# Patient Record
Sex: Female | Born: 1945 | Race: White | Hispanic: No | Smoking: Never smoker
Health system: Southern US, Community
[De-identification: ages and names within clinical notes are randomized; demographics above are authoritative.]

## PROBLEM LIST (undated history)

## (undated) DIAGNOSIS — I8290 Acute embolism and thrombosis of unspecified vein: Secondary | ICD-10-CM

## (undated) DIAGNOSIS — M549 Dorsalgia, unspecified: Secondary | ICD-10-CM

## (undated) DIAGNOSIS — I272 Pulmonary hypertension, unspecified: Secondary | ICD-10-CM

## (undated) DIAGNOSIS — D473 Essential (hemorrhagic) thrombocythemia: Secondary | ICD-10-CM

## (undated) DIAGNOSIS — I82409 Acute embolism and thrombosis of unspecified deep veins of unspecified lower extremity: Secondary | ICD-10-CM

## (undated) DIAGNOSIS — F329 Major depressive disorder, single episode, unspecified: Secondary | ICD-10-CM

## (undated) DIAGNOSIS — G4700 Insomnia, unspecified: Secondary | ICD-10-CM

## (undated) DIAGNOSIS — E785 Hyperlipidemia, unspecified: Secondary | ICD-10-CM

## (undated) DIAGNOSIS — D649 Anemia, unspecified: Secondary | ICD-10-CM

## (undated) DIAGNOSIS — I2510 Atherosclerotic heart disease of native coronary artery without angina pectoris: Secondary | ICD-10-CM

## (undated) DIAGNOSIS — I959 Hypotension, unspecified: Secondary | ICD-10-CM

## (undated) DIAGNOSIS — G2581 Restless legs syndrome: Secondary | ICD-10-CM

## (undated) DIAGNOSIS — M4800 Spinal stenosis, site unspecified: Secondary | ICD-10-CM

## (undated) DIAGNOSIS — F32A Depression, unspecified: Secondary | ICD-10-CM

## (undated) DIAGNOSIS — G35 Multiple sclerosis: Secondary | ICD-10-CM

## (undated) DIAGNOSIS — R760 Raised antibody titer: Secondary | ICD-10-CM

## (undated) DIAGNOSIS — I10 Essential (primary) hypertension: Secondary | ICD-10-CM

## (undated) DIAGNOSIS — M359 Systemic involvement of connective tissue, unspecified: Secondary | ICD-10-CM

## (undated) DIAGNOSIS — G589 Mononeuropathy, unspecified: Secondary | ICD-10-CM

## (undated) DIAGNOSIS — R339 Retention of urine, unspecified: Secondary | ICD-10-CM

## (undated) DIAGNOSIS — I5032 Chronic diastolic (congestive) heart failure: Secondary | ICD-10-CM

## (undated) DIAGNOSIS — K589 Irritable bowel syndrome without diarrhea: Secondary | ICD-10-CM

## (undated) DIAGNOSIS — H3530 Unspecified macular degeneration: Secondary | ICD-10-CM

## (undated) HISTORY — DX: Hypotension, unspecified: I95.9

## (undated) HISTORY — DX: Essential (hemorrhagic) thrombocythemia: D47.3

## (undated) HISTORY — DX: Chronic diastolic (congestive) heart failure: I50.32

## (undated) HISTORY — DX: Dorsalgia, unspecified: M54.9

## (undated) HISTORY — DX: Multiple sclerosis: G35

## (undated) HISTORY — DX: Essential (primary) hypertension: I10

## (undated) HISTORY — DX: Acute embolism and thrombosis of unspecified deep veins of unspecified lower extremity: I82.409

## (undated) HISTORY — DX: Unspecified macular degeneration: H35.30

## (undated) HISTORY — DX: Mononeuropathy, unspecified: G58.9

## (undated) HISTORY — DX: Spinal stenosis, site unspecified: M48.00

## (undated) HISTORY — DX: Restless legs syndrome: G25.81

## (undated) HISTORY — DX: Acute embolism and thrombosis of unspecified vein: I82.90

## (undated) HISTORY — DX: Irritable bowel syndrome, unspecified: K58.9

## (undated) HISTORY — DX: Raised antibody titer: R76.0

## (undated) HISTORY — PX: CHOLECYSTECTOMY: SHX55

## (undated) HISTORY — DX: Hyperlipidemia, unspecified: E78.5

## (undated) HISTORY — PX: TONSILLECTOMY: SUR1361

## (undated) HISTORY — DX: Depression, unspecified: F32.A

## (undated) HISTORY — PX: LUMBAR LAMINECTOMY: SHX95

## (undated) HISTORY — DX: Retention of urine, unspecified: R33.9

## (undated) HISTORY — PX: APPENDECTOMY: SHX54

## (undated) HISTORY — DX: Pulmonary hypertension, unspecified: I27.20

## (undated) HISTORY — DX: Anemia, unspecified: D64.9

## (undated) HISTORY — DX: Major depressive disorder, single episode, unspecified: F32.9

## (undated) HISTORY — DX: Insomnia, unspecified: G47.00

## (undated) HISTORY — DX: Atherosclerotic heart disease of native coronary artery without angina pectoris: I25.10

## (undated) HISTORY — PX: LUMBAR DISC SURGERY: SHX700

---

## 2006-03-14 ENCOUNTER — Ambulatory Visit: Payer: Self-pay

## 2007-05-10 ENCOUNTER — Ambulatory Visit: Payer: Self-pay | Admitting: Family Medicine

## 2007-05-10 LAB — CONVERTED CEMR LAB
Basophils Relative: 0.7 % (ref 0.0–1.0)
Eosinophils Relative: 1 % (ref 0.0–5.0)
Lymphocytes Relative: 27 % (ref 12.0–46.0)
Monocytes Relative: 11 % (ref 3.0–11.0)
Neutro Abs: 3.1 10*3/uL (ref 1.4–7.7)
Platelets: 523 10*3/uL — ABNORMAL HIGH (ref 150–400)
RDW: 13.8 % (ref 11.5–14.6)
WBC: 5.2 10*3/uL (ref 4.5–10.5)

## 2007-05-31 ENCOUNTER — Encounter: Payer: Self-pay | Admitting: Family Medicine

## 2007-08-15 ENCOUNTER — Telehealth: Payer: Self-pay | Admitting: Family Medicine

## 2008-01-24 ENCOUNTER — Telehealth: Payer: Self-pay | Admitting: Family Medicine

## 2008-04-29 ENCOUNTER — Ambulatory Visit: Payer: Self-pay | Admitting: Family Medicine

## 2008-04-29 DIAGNOSIS — R5383 Other fatigue: Secondary | ICD-10-CM

## 2008-04-29 DIAGNOSIS — G35 Multiple sclerosis: Secondary | ICD-10-CM

## 2008-04-29 DIAGNOSIS — G589 Mononeuropathy, unspecified: Secondary | ICD-10-CM | POA: Insufficient documentation

## 2008-04-29 DIAGNOSIS — R5381 Other malaise: Secondary | ICD-10-CM

## 2008-04-29 HISTORY — DX: Multiple sclerosis: G35

## 2008-04-29 HISTORY — DX: Mononeuropathy, unspecified: G58.9

## 2008-04-29 LAB — CONVERTED CEMR LAB
Bilirubin Urine: NEGATIVE
Ketones, urine, test strip: NEGATIVE
Protein, U semiquant: NEGATIVE
Urobilinogen, UA: 0.2
pH: 5

## 2008-04-30 LAB — CONVERTED CEMR LAB
ALT: 18 units/L (ref 0–35)
AST: 27 units/L (ref 0–37)
Basophils Absolute: 0 10*3/uL (ref 0.0–0.1)
Basophils Relative: 0.6 % (ref 0.0–1.0)
Bilirubin, Direct: 0.1 mg/dL (ref 0.0–0.3)
CO2: 28 meq/L (ref 19–32)
Calcium: 9.4 mg/dL (ref 8.4–10.5)
Chloride: 99 meq/L (ref 96–112)
Cholesterol: 185 mg/dL (ref 0–200)
Creatinine, Ser: 1 mg/dL (ref 0.4–1.2)
Eosinophils Absolute: 0 10*3/uL (ref 0.0–0.7)
GFR calc non Af Amer: 60 mL/min
HDL: 72.3 mg/dL (ref >39.0)
LDL Cholesterol: 102 mg/dL — ABNORMAL HIGH (ref 0–99)
Lymphocytes Relative: 26.7 % (ref 12.0–46.0)
MCHC: 33.2 g/dL (ref 30.0–36.0)
MCV: 97.4 fL (ref 78.0–100.0)
Neutrophils Relative %: 63.3 % (ref 43.0–77.0)
RDW: 15.1 % — ABNORMAL HIGH (ref 11.5–14.6)
Sodium: 139 meq/L (ref 135–145)
TSH: 4.39 microintl units/mL (ref 0.35–5.50)
Total Bilirubin: 1 mg/dL (ref 0.3–1.2)
Triglycerides: 54 mg/dL (ref 0–149)
VLDL: 11 mg/dL (ref 0–40)

## 2008-05-02 ENCOUNTER — Telehealth: Payer: Self-pay | Admitting: Family Medicine

## 2008-05-20 ENCOUNTER — Encounter: Payer: Self-pay | Admitting: Family Medicine

## 2008-05-21 ENCOUNTER — Telehealth: Payer: Self-pay | Admitting: Family Medicine

## 2008-07-04 ENCOUNTER — Encounter: Payer: Self-pay | Admitting: Family Medicine

## 2008-09-16 ENCOUNTER — Ambulatory Visit: Payer: Self-pay | Admitting: Family Medicine

## 2008-09-17 ENCOUNTER — Telehealth: Payer: Self-pay | Admitting: Family Medicine

## 2008-10-08 ENCOUNTER — Encounter: Payer: Self-pay | Admitting: Family Medicine

## 2009-01-20 ENCOUNTER — Ambulatory Visit: Payer: Self-pay | Admitting: Family Medicine

## 2009-02-02 ENCOUNTER — Telehealth: Payer: Self-pay | Admitting: Family Medicine

## 2009-02-11 ENCOUNTER — Telehealth: Payer: Self-pay | Admitting: Family Medicine

## 2009-05-15 ENCOUNTER — Telehealth (INDEPENDENT_AMBULATORY_CARE_PROVIDER_SITE_OTHER): Payer: Self-pay | Admitting: *Deleted

## 2010-04-29 ENCOUNTER — Ambulatory Visit: Payer: Self-pay | Admitting: Family Medicine

## 2010-04-29 ENCOUNTER — Other Ambulatory Visit: Admission: RE | Admit: 2010-04-29 | Discharge: 2010-04-29 | Payer: Self-pay | Admitting: Family Medicine

## 2010-04-29 DIAGNOSIS — E7800 Pure hypercholesterolemia, unspecified: Secondary | ICD-10-CM

## 2010-04-29 DIAGNOSIS — N8112 Cystocele, lateral: Secondary | ICD-10-CM

## 2010-04-29 DIAGNOSIS — F32A Depression, unspecified: Secondary | ICD-10-CM | POA: Insufficient documentation

## 2010-04-29 DIAGNOSIS — T50995A Adverse effect of other drugs, medicaments and biological substances, initial encounter: Secondary | ICD-10-CM | POA: Insufficient documentation

## 2010-04-29 DIAGNOSIS — F329 Major depressive disorder, single episode, unspecified: Secondary | ICD-10-CM

## 2010-04-29 LAB — CONVERTED CEMR LAB
Glucose, Urine, Semiquant: NEGATIVE
Ketones, urine, test strip: NEGATIVE
Protein, U semiquant: NEGATIVE
Specific Gravity, Urine: 1.02
pH: 7

## 2010-04-29 LAB — HM PAP SMEAR

## 2010-05-12 LAB — CONVERTED CEMR LAB
AST: 25 units/L (ref 0–37)
BUN: 16 mg/dL (ref 6–23)
Basophils Absolute: 0.1 10*3/uL (ref 0.0–0.1)
Bilirubin, Direct: 0.1 mg/dL (ref 0.0–0.3)
Calcium: 9.1 mg/dL (ref 8.4–10.5)
Creatinine, Ser: 0.9 mg/dL (ref 0.4–1.2)
GFR calc non Af Amer: 66.05 mL/min (ref >60)
Glucose, Bld: 85 mg/dL (ref 70–99)
HDL: 93.6 mg/dL (ref >39.00)
Lymphocytes Relative: 23.9 % (ref 12.0–46.0)
Lymphs Abs: 1.2 10*3/uL (ref 0.7–4.0)
Monocytes Relative: 10.2 % (ref 3.0–12.0)
Neutrophils Relative %: 63.8 % (ref 43.0–77.0)
Pap Smear: NEGATIVE
Platelets: 379 10*3/uL (ref 150.0–400.0)
RDW: 14.7 % — ABNORMAL HIGH (ref 11.5–14.6)
TSH: 4.06 microintl units/mL (ref 0.35–5.50)
Total Bilirubin: 0.6 mg/dL (ref 0.3–1.2)
Triglycerides: 36 mg/dL (ref 0.0–149.0)
VLDL: 7.2 mg/dL (ref 0.0–40.0)
WBC: 4.9 10*3/uL (ref 4.5–10.5)

## 2010-11-28 DIAGNOSIS — D649 Anemia, unspecified: Secondary | ICD-10-CM

## 2010-11-28 HISTORY — PX: ANTERIOR LUMBAR FUSION: SHX1170

## 2010-11-28 HISTORY — DX: Anemia, unspecified: D64.9

## 2010-12-28 NOTE — Assessment & Plan Note (Signed)
Summary: emp---will fast//ccm   Vital Signs:  Patient profile:   65 year old female Height:      64 inches Weight:      119 pounds BMI:     20.50 O2 Sat:      98 % Temp:     97.6 degrees F Pulse rate:   96 / minute BP sitting:   110 / 72  (left arm)  Vitals Entered By: Pura Spice, RN (April 29, 2010 11:00 AM) CC: fasting for labs discuss problems refill meds    History of Present Illness: This 65 year old would've who is had known multiple sclerosis for many years and treated by physicians at [facility]diagnosed at age 29 M complaint is that of pain in the right heel with physical activity. She does not take Celebrex as prescribed nor diclofenac She is in today to discuss her medical problems and refill her medication She had colonoscopic exam 2005 she is under the care of Dr. Kerney Elbe just in [place] She desired a pelvic examination, she is uncertain as to what's in her vagina she feels as if she has a dropped bladder, cystocele Patient continues to be depressed over the loss of her husband and as continued to take Celexa have her go this continued to have some depression  EKG  Procedure date:  04/29/2010  Findings:       sinus rhythm with rate of:  57 Sinus Bradycardia Poor R wave progression-probale normal varian   Allergies (verified): No Known Drug Allergies  Past History:  Past Medical History: Last updated: 09/05/2007 thrombocytopenia  Past History:  Care Management (cont.): Ophthalmology: Dr Lamount Cranker exam 2011 per pt  Gastroenterology: Melene Plan to [facility]    Review of Systems      See HPI General:  See HPI; Complains of malaise. Eyes:  Complains of vision loss-both eyes; decreased vision bilaterally. ENT:  Denies decreased hearing, difficulty swallowing, ear discharge, earache, hoarseness, nasal congestion, nosebleeds, postnasal drainage, ringing in ears, sinus pressure, and sore throat. CV:  Denies bluish discoloration of lips or  nails, chest pain or discomfort, difficulty breathing at night, difficulty breathing while lying down, fainting, fatigue, leg cramps with exertion, lightheadness, near fainting, palpitations, shortness of breath with exertion, swelling of feet, swelling of hands, and weight gain. Resp:  Denies chest discomfort, chest pain with inspiration, cough, coughing up blood, excessive snoring, hypersomnolence, morning headaches, pleuritic, shortness of breath, sputum productive, and wheezing. GI:  Denies abdominal pain, bloody stools, change in bowel habits, constipation, dark tarry stools, diarrhea, excessive appetite, gas, hemorrhoids, indigestion, loss of appetite, nausea, vomiting, vomiting blood, and yellowish skin color. GU:  See HPI; concerned about possible cystocele. MS:  See HPI; Complains of joint pain.  Physical Exam  General:  Well-developed,well-nourished,in no acute distress; alert,appropriate and cooperative throughout examination Head:  Normocephalic and atraumatic without obvious abnormalities. No apparent alopecia or balding. Eyes:  No corneal or conjunctival inflammation noted. EOMI. Perrla. Funduscopic exam benign, without hemorrhages, exudates or papilledema. Vision grossly normal. Ears:  External ear exam shows no significant lesions or deformities.  Otoscopic examination reveals clear canals, tympanic membranes are intact bilaterally without bulging, retraction, inflammation or discharge. Hearing is grossly normal bilaterally. Nose:  External nasal examination shows no deformity or inflammation. Nasal mucosa are pink and moist without lesions or exudates. Mouth:  Oral mucosa and oropharynx without lesions or exudates.  Teeth in good repair. Neck:  No deformities, masses, or tenderness noted. Chest Wall:  No deformities, masses,  or tenderness noted. Breasts:  No mass, nodules, thickening, tenderness, bulging, retraction, inflamation, nipple discharge or skin changes noted.   Lungs:   Normal respiratory effort, chest expands symmetrically. Lungs are clear to auscultation, no crackles or wheezes. Heart:  Normal rate and regular rhythm. S1 and S2 normal without gallop, murmur, click, rub or other extra sounds. Abdomen:  Bowel sounds positive,abdomen soft and non-tender without masses, organomegaly or hernias noted. Rectal:  No external abnormalities noted. Normal sphincter tone. No rectal masses or tenderness. Genitalia:  pelvic examination essentially normal except for first degree cystocele no treatment needed Msk:  No deformity or scoliosis noted of thoracic or lumbar spine.   Pulses:  R and L carotid,radial,femoral,dorsalis pedis and posterior tibial pulses are full and equal bilaterally Extremities:  No clubbing, cyanosis, edema, or deformity noted with normal full range of motion of all joints.   Neurologic:  No cranial nerve deficits noted. Station and gait are normal. Plantar reflexes are down-going bilaterally. DTRs are symmetrical throughout. Sensory, motor and coordinative functions appear intact. Skin:  Intact without suspicious lesions or rashes Cervical Nodes:  No lymphadenopathy noted Axillary Nodes:  No palpable lymphadenopathy Inguinal Nodes:  No significant adenopathy Psych:  Cognition and judgment appear intact. Alert and cooperative with normal attention span and concentration. No apparent delusions, illusions, hallucinations   Impression & Recommendations:  Problem # 1:  MULTIPLE SCLEROSIS (ICD-340) Assessment Unchanged Hydrea and gabapentin prescribed gave  Problem # 2:  NEUROPATHY (ICD-355.9) Assessment: Unchanged gabapentin 300 mg q.h.s.  Problem # 3:  VITAMIN D DEFICIENCY (ICD-268.9) Assessment: New  Orders: T-Vitamin D (25-Hydroxy) (78295-62130)  Problem # 4:  ARTHRITIS (ICD-716.90) Assessment: Deteriorated diclofenac 75 mg b.i.d.  Problem # 5:  DEPRESSION, MILD (ICD-311) Assessment: Unchanged  Her updated medication list for this  problem includes:    Celexa 20 Mg Tabs (Citalopram hydrobromide) .Marland Kitchen... 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 Take 1 tablet by mouth two times a day after meals 2)  Aspirin 81 Mg Tbec (Aspirin) 3)  Hydrea 500 Mg Caps (Hydroxyurea) .... Once daily 4)  Gabapentin 300 Mg Caps (Gabapentin) .... 2 hs 5)  Hydrocodone-acetaminophen 5-325 Mg Tabs (Hydrocodone-acetaminophen) .Marland Kitchen.. 1 or 2 every 4-6 hrs as needed pain not to exceed 4 per day 6)  Celexa 20 Mg Tabs (Citalopram hydrobromide) .Marland Kitchen.. 1 by mouth once daily 7)  Vitamin D (ergocalciferol) 50000 Unit Caps (Ergocalciferol) .Marland KitchenMarland KitchenMarland Kitchen 1 weekly for 12 weeks  Other Orders: UA Dipstick w/o Micro (automated)  (81003) Venipuncture (46962) TLB-Lipid Panel (80061-LIPID) TLB-BMP (Basic Metabolic Panel-BMET) (80048-METABOL) TLB-Hepatic/Liver Function Pnl (80076-HEPATIC)  Patient Instructions: 1)  Restructure diclofenac for your arthritis 2)  Continued: Your treatment at [facility] for multiple sclerosis 3)  Continue Celexa for your depression 4)  Do not hesitate to use her hydrocodone for pain 5)  He had a small cystocele which does not need to be treated at this time Prescriptions: VITAMIN D (ERGOCALCIFEROL) 50000 UNIT CAPS (ERGOCALCIFEROL) 1 weekly for 12 weeks  #12 x 1   Entered and Authorized by:   Judithann Sheen MD   Signed by:   Judithann Sheen MD on  05/12/2010   Method used:   Electronically to        [facility] (retail)       [address]       [place], [place]   [address]       Ph: [phone]       Fax: [phone]   RxID:   [id number] HYDROCODONE-ACETAMINOPHEN 5-325 MG TABS (HYDROCODONE-ACETAMINOPHEN) 1 or 2 every 4-6 hrs as needed pain not to exceed 4 per day  #50 x 5   Entered and Authorized by:   Judithann Sheen MD   Signed by:   Judithann Sheen MD on 04/29/2010   Method used:   Print then Give to Patient   RxID:   [id number] CELEXA 20 MG TABS (CITALOPRAM HYDROBROMIDE) 1 by mouth once daily  #30 x 11   Entered and Authorized by:   Judithann Sheen MD   Signed by:   Judithann Sheen MD on 04/29/2010   Method used:   Electronically to        [facility] (retail)       [address]       [place], [place]  [address]       Ph: [phone]       Fax: [phone]   RxID:   [id number] DICLOFENAC SODIUM 75 MG  TBEC (DICLOFENAC SODIUM) Take 1 tablet by mouth two times a day after meals  #60 x 11   Entered and Authorized by:   Judithann Sheen MD   Signed by:   Judithann Sheen MD on 04/29/2010   Method used:   Electronically to        [facility] (retail)       [address]       [place], [place]  [address]       Ph: [phone]       Fax: [phone]   RxID:   [id number]          Laboratory Results   Urine Tests  Date/Time Recieved: April 29, 2010 1:17 PM  Date/Time Reported: April 29, 2010 1:17 PM   Routine Urinalysis   Color: yellow Appearance: Clear Glucose: negative   (Normal Range: Negative) Bilirubin: negative   (Normal Range: Negative) Ketone: negative   (Normal Range: Negative) Spec. Gravity: 1.020   (Normal Range: 1.003-1.035) Blood: trace-intact   (Normal Range: Negative) pH: 7.0   (Normal Range: 5.0-8.0) Protein: negative   (Normal Range: Negative) Urobilinogen: 0.2   (Normal Range: 0-1) Nitrite: negative   (Normal Range: Negative) Leukocyte Esterace: trace   (Normal Range: Negative)    Comments: Wynona Canes, CMA  April 29, 2010 1:17 PM

## 2011-02-15 ENCOUNTER — Telehealth: Payer: Self-pay | Admitting: *Deleted

## 2011-02-15 NOTE — Telephone Encounter (Signed)
Pt needs rx refill of hydrea 500 mg sent to [facility] in [place] [phone]

## 2011-02-16 NOTE — Telephone Encounter (Signed)
Pt called back to check on status of Hydrea 500mg  to [facility] in [place] [phone]. Pt has been out of med since Monday. Pls call in asap.

## 2011-02-17 ENCOUNTER — Other Ambulatory Visit: Payer: Self-pay | Admitting: Family Medicine

## 2011-02-17 MED ORDER — HYDROXYUREA 500 MG PO CAPS: 500.0000 mg | ORAL_CAPSULE | Freq: Every day | ORAL | Status: AC

## 2011-02-17 NOTE — Telephone Encounter (Signed)
rx faxed to [facility] in [place]

## 2011-02-24 NOTE — Telephone Encounter (Signed)
Done, per Dr. Scotty Court

## 2011-03-22 ENCOUNTER — Encounter: Payer: Self-pay | Admitting: Internal Medicine

## 2011-03-29 ENCOUNTER — Encounter: Payer: Self-pay | Admitting: Internal Medicine

## 2011-04-21 ENCOUNTER — Telehealth: Payer: Self-pay | Admitting: Family Medicine

## 2011-04-21 NOTE — Telephone Encounter (Signed)
Spoke with pt and she is aware she needs to call [facility] in [place].

## 2011-04-21 NOTE — Telephone Encounter (Signed)
Pt had back surgery 03/19/11 and is req a referral to [facility] in [place], [place], because pt can no longer drive to [place]. Pls call asap.

## 2011-04-28 ENCOUNTER — Telehealth: Payer: Self-pay

## 2011-04-28 NOTE — Telephone Encounter (Signed)
Received a message from Gwynn Burly regarding continuing skilled nursing visits with pt until her appointment with her new physicianJune.  Ok per Dr. Scotty Court

## 2011-05-26 ENCOUNTER — Ambulatory Visit (INDEPENDENT_AMBULATORY_CARE_PROVIDER_SITE_OTHER): Payer: BC Managed Care – HMO | Admitting: Family Medicine

## 2011-05-26 ENCOUNTER — Encounter: Payer: Self-pay | Admitting: Family Medicine

## 2011-05-26 DIAGNOSIS — F329 Major depressive disorder, single episode, unspecified: Secondary | ICD-10-CM

## 2011-05-26 DIAGNOSIS — F3289 Other specified depressive episodes: Secondary | ICD-10-CM

## 2011-05-26 DIAGNOSIS — G35 Multiple sclerosis: Secondary | ICD-10-CM

## 2011-05-26 DIAGNOSIS — D473 Essential (hemorrhagic) thrombocythemia: Secondary | ICD-10-CM | POA: Insufficient documentation

## 2011-05-26 DIAGNOSIS — R609 Edema, unspecified: Secondary | ICD-10-CM

## 2011-05-26 NOTE — Progress Notes (Signed)
New pt.   MS- long standing with dec sensation in the L foot.  Requesting records from [facility].  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  Due for labs.  Per patient, prev controlled on this med w/o ADE.   Edema- recently noted.  Not sob and no CP.  Not on BP meds and BP wnl today.  Better with elevation.  Back pain- longstanding and requesting records from [facility].    PMH and SH reviewed  ROS: See HPI, otherwise noncontributory.  Meds, vitals, and allergies reviewed.   GEN: nad, alert and oriented, stooped over due to apparently chronic back changes HEENT: mucous membranes moist NECK: supple w/o LA CV: rrr PULM: ctab, no inc wob ABD: soft, +bs EXT: 1+ edema but well perfused SKIN: no acute rash

## 2011-05-26 NOTE — Patient Instructions (Signed)
You can get your results through our phone system.  Follow the instructions on the blue card. Don't change your meds.  Let me know if the swelling gets worse.  Try to keep your legs elevated. I want to see you back for a visit in 3 months.  Take care.   We'll request your records.

## 2011-05-27 ENCOUNTER — Encounter: Payer: Self-pay | Admitting: Family Medicine

## 2011-05-27 LAB — COMPREHENSIVE METABOLIC PANEL
AST: 21 U/L (ref 0–37)
Alkaline Phosphatase: 66 U/L (ref 39–117)
BUN: 33 mg/dL — ABNORMAL HIGH (ref 6–23)
Creatinine, Ser: 0.8 mg/dL (ref 0.4–1.2)

## 2011-05-27 LAB — CBC WITH DIFFERENTIAL/PLATELET
Basophils Relative: 0.2 % (ref 0.0–3.0)
Eosinophils Absolute: 0 10*3/uL (ref 0.0–0.7)
HCT: 33.3 % — ABNORMAL LOW (ref 36.0–46.0)
Hemoglobin: 11 g/dL — ABNORMAL LOW (ref 12.0–15.0)
Lymphs Abs: 0.9 10*3/uL (ref 0.7–4.0)
MCHC: 33 g/dL (ref 30.0–36.0)
MCV: 94.8 fl (ref 78.0–100.0)
Monocytes Absolute: 0.5 10*3/uL (ref 0.1–1.0)
Neutro Abs: 9.6 10*3/uL — ABNORMAL HIGH (ref 1.4–7.7)
RBC: 3.51 Mil/uL — ABNORMAL LOW (ref 3.87–5.11)

## 2011-05-27 NOTE — Assessment & Plan Note (Signed)
Continue current meds 

## 2011-05-27 NOTE — Assessment & Plan Note (Signed)
No recent changes, requesting records.

## 2011-05-27 NOTE — Assessment & Plan Note (Signed)
Lungs ctab and nontoxic, will check labs and notify pt. No change in meds.

## 2011-05-27 NOTE — Assessment & Plan Note (Signed)
Check labs, no change in meds.  Requesting old records.

## 2011-06-02 ENCOUNTER — Telehealth: Payer: Self-pay | Admitting: *Deleted

## 2011-06-02 NOTE — Telephone Encounter (Signed)
Form for handicapped placard is on your desk. 

## 2011-06-02 NOTE — Telephone Encounter (Signed)
Please scan and give to pt.  Thanks.

## 2011-06-02 NOTE — Telephone Encounter (Signed)
Patient notified as instructed by telephone.Form left at front desk for pick up. Copy of form sent to be scanned.

## 2011-07-18 ENCOUNTER — Other Ambulatory Visit: Payer: Self-pay | Admitting: *Deleted

## 2011-07-18 MED ORDER — ROPINIROLE HCL 0.25 MG PO TABS: 0.2500 mg | ORAL_TABLET | Freq: Three times a day (TID) | ORAL | Status: DC

## 2011-07-18 NOTE — Telephone Encounter (Signed)
Pt is asking for refills to be called in, previously filled by doctor at [facility].

## 2011-07-18 NOTE — Telephone Encounter (Signed)
Sent!

## 2011-07-20 ENCOUNTER — Encounter: Payer: Self-pay | Admitting: Family Medicine

## 2011-07-21 ENCOUNTER — Encounter: Payer: Self-pay | Admitting: Family Medicine

## 2011-07-21 ENCOUNTER — Ambulatory Visit (INDEPENDENT_AMBULATORY_CARE_PROVIDER_SITE_OTHER): Payer: BC Managed Care – HMO | Admitting: Family Medicine

## 2011-07-21 VITALS — BP 128/84 | HR 88 | Temp 97.7°F | Wt 128.1 lb

## 2011-07-21 DIAGNOSIS — G589 Mononeuropathy, unspecified: Secondary | ICD-10-CM

## 2011-07-21 MED ORDER — GABAPENTIN 600 MG PO TABS: 600.0000 mg | ORAL_TABLET | Freq: Three times a day (TID) | ORAL | Status: DC

## 2011-07-21 NOTE — Patient Instructions (Signed)
I would increase the dose of neurontin for the pain.  If that isn't helping, let us know so we can try to move up your appointment at [facility].  Take care.

## 2011-07-21 NOTE — Progress Notes (Signed)
Laminectomy in April at [facility].  Spinal injection in May for pain and spasms, that helped some.  Spasm in L IT band.  It can cramp up and last ~1 hour.  Painful.  Now she has a cramp several times a day.  Now on gabapentin and requip with some relief.  When she has a spasm in leg, she occ feels the need to urinate.  No pain on urination.  No loss of bowel control.  Asking about options.  Meds, vitals, and allergies reviewed.   ROS: See HPI.  Otherwise, noncontributory.  GEN: nad, alert and oriented, stooped over due to chronic back changes  HEENT: mucous membranes moist  NECK: supple w/o LA  CV: rrr  PULM: ctab, no inc wob  ABD: soft, +bs  EXT: 1+ edema but well perfused  SKIN: no acute rash L SLR positive.  Back w/o midline pain.  Healed scars noted on L spine L leg diffusely weak, but this is old finding per patient.

## 2011-07-22 NOTE — Assessment & Plan Note (Signed)
She has positional sx, with spasm in L thigh, improved after laminectomy.  I talked with pt about options. I would inc the gabapentin to see if that helped with pain and then call back if not improved so we can help get her back in at [facility] sooner than she is routinely scheduled.  She agrees. No new weakness.  Okay for outpatient f/u.  She agrees.

## 2011-07-26 ENCOUNTER — Other Ambulatory Visit: Payer: Self-pay | Admitting: *Deleted

## 2011-07-27 MED ORDER — GABAPENTIN 600 MG PO TABS: 600.0000 mg | ORAL_TABLET | Freq: Three times a day (TID) | ORAL | Status: DC

## 2011-08-29 ENCOUNTER — Ambulatory Visit (INDEPENDENT_AMBULATORY_CARE_PROVIDER_SITE_OTHER): Payer: BC Managed Care – HMO | Admitting: Family Medicine

## 2011-08-29 ENCOUNTER — Encounter: Payer: Self-pay | Admitting: Family Medicine

## 2011-08-29 DIAGNOSIS — F329 Major depressive disorder, single episode, unspecified: Secondary | ICD-10-CM

## 2011-08-29 DIAGNOSIS — R5381 Other malaise: Secondary | ICD-10-CM

## 2011-08-29 DIAGNOSIS — R5383 Other fatigue: Secondary | ICD-10-CM

## 2011-08-29 DIAGNOSIS — G35 Multiple sclerosis: Secondary | ICD-10-CM

## 2011-08-29 MED ORDER — CITALOPRAM HYDROBROMIDE 10 MG PO TABS: 10.0000 mg | ORAL_TABLET | ORAL | Status: DC

## 2011-08-29 MED ORDER — BACLOFEN 10 MG PO TABS: 10.0000 mg | ORAL_TABLET | Freq: Three times a day (TID) | ORAL | Status: DC

## 2011-08-29 MED ORDER — BACLOFEN 5 MG HALF TABLET: 5.0000 mg | ORAL_TABLET | Freq: Three times a day (TID) | ORAL | Status: DC

## 2011-08-29 NOTE — Patient Instructions (Addendum)
I want you to talk to ortho about the pain in your right arm.  I don't know if this is related to the MS and I want their input.   We'll request your records, but please ask ortho to send a note to me.  I would restart the citalopram at 10mg  a day.  I would restart the baclofen 10mg  three times a day. It can make you drowsy.   I'd like to see you back after you see Neuro at [facility], visit in 12/12.  Take care.

## 2011-08-29 NOTE — Progress Notes (Signed)
She fell about 1 month ago.  Facial bruising is resolving.  No residual facial pain. No other falls in the meantime.    The muscle spasms are some better after another injection in her back.  Now they'll last about in the leg.  She occ get R arm pain at night, radiating down the R arm.  It gets better as she stands up.  It is a burning pain.  She is off the baclofen now and asking about this.   She may be more anxious, but she is off the citalopram.  Her mood is "good sometimes and bad sometimes."  She is still grieving her husband who died 4 years ago.  Fatigue persists, likely multifactorial.    She sees Ortho tomorrow at [facility], Neuro in 11/12.    Meds, vitals, and allergies reviewed.   ROS: See HPI.  Otherwise, noncontributory.  nad but chronically ill appearing, at baseline  ncat except for resolving faint bruise on the r side of the face.  TMs, nasal and Op exam w/o acute changes. Frontal and max areas not ttp TMJ not ttp Neck supple rrr ctab Gait is slow with shortened stride length

## 2011-08-29 NOTE — Assessment & Plan Note (Addendum)
I think it is reasonable to add the baclofen back on for the spasms and she'll f/u with ortho about her back pain and shoulder pain.  I would like their input even though she didn't have impingement on R shoulder exam today.  I don't know if the pain in the shoulder is due to cervical disease or MS.  We are re-requesting records from [facility] ortho and neuro.

## 2011-08-29 NOTE — Assessment & Plan Note (Signed)
As above.

## 2011-08-29 NOTE — Assessment & Plan Note (Signed)
Likely multifactorial; I would restart SSRI.  Prev with hgb that wasn't sig low to explain sx.

## 2011-08-30 MED ORDER — ROPINIROLE HCL 0.25 MG PO TABS: 0.2500 mg | ORAL_TABLET | Freq: Three times a day (TID) | ORAL | Status: DC

## 2011-08-30 MED ORDER — GABAPENTIN 600 MG PO TABS: 600.0000 mg | ORAL_TABLET | Freq: Three times a day (TID) | ORAL | Status: DC

## 2011-08-30 NOTE — Progress Notes (Signed)
Addended by: Lars Mage on: 08/30/2011 12:01 AM   Modules accepted: Orders

## 2011-09-02 ENCOUNTER — Encounter: Payer: Self-pay | Admitting: Family Medicine

## 2011-09-02 ENCOUNTER — Ambulatory Visit (INDEPENDENT_AMBULATORY_CARE_PROVIDER_SITE_OTHER): Payer: Medicare Other | Admitting: Family Medicine

## 2011-09-02 VITALS — BP 124/80 | HR 81 | Temp 98.1°F | Wt 127.0 lb

## 2011-09-02 DIAGNOSIS — R8290 Unspecified abnormal findings in urine: Secondary | ICD-10-CM

## 2011-09-02 DIAGNOSIS — N390 Urinary tract infection, site not specified: Secondary | ICD-10-CM

## 2011-09-02 DIAGNOSIS — R82998 Other abnormal findings in urine: Secondary | ICD-10-CM

## 2011-09-02 LAB — POCT URINALYSIS DIPSTICK
Blood, UA: NEGATIVE
Glucose, UA: NEGATIVE
Nitrite, UA: NEGATIVE
Urobilinogen, UA: NEGATIVE

## 2011-09-02 MED ORDER — SULFAMETHOXAZOLE-TRIMETHOPRIM 800-160 MG PO TABS: 1.0000 | ORAL_TABLET | Freq: Two times a day (BID) | ORAL | Status: AC

## 2011-09-02 NOTE — Patient Instructions (Signed)
Drink plenty of water and start the antibiotics today.  We'll contact you with your lab report.  Take care.   

## 2011-09-02 NOTE — Progress Notes (Signed)
"  I can't pee, and then I can." Dysuria: minimal burning, leaking some urine.  duration of symptoms: a few weeks, worse this week.   abdominal pain: no Fevers: no back pain: no Vomiting: no  Baclofen helps some with the spasms.  She went to PT and she has a pinched nerve in her neck.  They are working on it with exercises.    Meds, vitals, and allergies reviewed.   ROS: See HPI.  Otherwise negative.    Nad, thin, chronically ill appearing but at baseline rrr ctab Mildly ttp over bladder but abd wnl o/w No cva pain Gait at baseline

## 2011-09-04 ENCOUNTER — Encounter: Payer: Self-pay | Admitting: Family Medicine

## 2011-09-04 LAB — URINE CULTURE: Colony Count: NO GROWTH

## 2011-09-04 NOTE — Assessment & Plan Note (Signed)
Fluids, septra, ucx and f/u prn.  She agrees.

## 2011-09-12 ENCOUNTER — Encounter: Payer: Self-pay | Admitting: Family Medicine

## 2011-09-20 ENCOUNTER — Other Ambulatory Visit: Payer: Self-pay | Admitting: *Deleted

## 2011-09-20 NOTE — Telephone Encounter (Signed)
Patient wants a refill on Requip. Patient states that she is now taking this FOUR TIMES A DAY.  Pharmacy-[facility].

## 2011-09-21 ENCOUNTER — Telehealth: Payer: Self-pay | Admitting: *Deleted

## 2011-09-21 DIAGNOSIS — R300 Dysuria: Secondary | ICD-10-CM

## 2011-09-21 MED ORDER — ROPINIROLE HCL 0.25 MG PO TABS: 0.2500 mg | ORAL_TABLET | Freq: Four times a day (QID) | ORAL | Status: DC

## 2011-09-21 NOTE — Telephone Encounter (Signed)
Sent!

## 2011-09-21 NOTE — Telephone Encounter (Signed)
Pt states her bladder fell about a year ago and she would like referral to urologist to see if she can get it repaired.  She wants to see someone in [place].

## 2011-09-25 ENCOUNTER — Emergency Department: Payer: Self-pay | Admitting: Unknown Physician Specialty

## 2011-09-28 NOTE — Telephone Encounter (Signed)
Appt made with Myer Haff and records were sent. MK

## 2011-09-29 ENCOUNTER — Inpatient Hospital Stay: Payer: Self-pay | Admitting: Internal Medicine

## 2011-09-29 DIAGNOSIS — I369 Nonrheumatic tricuspid valve disorder, unspecified: Secondary | ICD-10-CM

## 2011-09-29 DIAGNOSIS — R7401 Elevation of levels of liver transaminase levels: Secondary | ICD-10-CM

## 2011-09-29 DIAGNOSIS — R000 Tachycardia, unspecified: Secondary | ICD-10-CM

## 2011-09-29 DIAGNOSIS — R740 Nonspecific elevation of levels of transaminase and lactic acid dehydrogenase [LDH]: Secondary | ICD-10-CM

## 2011-10-01 ENCOUNTER — Encounter: Payer: Self-pay | Admitting: Family Medicine

## 2011-10-01 DIAGNOSIS — R000 Tachycardia, unspecified: Secondary | ICD-10-CM | POA: Insufficient documentation

## 2011-10-17 ENCOUNTER — Ambulatory Visit (INDEPENDENT_AMBULATORY_CARE_PROVIDER_SITE_OTHER): Payer: Medicare Other | Admitting: Family Medicine

## 2011-10-17 ENCOUNTER — Encounter: Payer: Self-pay | Admitting: Family Medicine

## 2011-10-17 VITALS — BP 102/58 | HR 72 | Temp 97.3°F | Wt 129.1 lb

## 2011-10-17 DIAGNOSIS — D649 Anemia, unspecified: Secondary | ICD-10-CM | POA: Insufficient documentation

## 2011-10-17 DIAGNOSIS — G589 Mononeuropathy, unspecified: Secondary | ICD-10-CM

## 2011-10-17 DIAGNOSIS — S2239XA Fracture of one rib, unspecified side, initial encounter for closed fracture: Secondary | ICD-10-CM

## 2011-10-17 DIAGNOSIS — R5381 Other malaise: Secondary | ICD-10-CM

## 2011-10-17 DIAGNOSIS — R609 Edema, unspecified: Secondary | ICD-10-CM

## 2011-10-17 DIAGNOSIS — I519 Heart disease, unspecified: Secondary | ICD-10-CM

## 2011-10-17 DIAGNOSIS — E7800 Pure hypercholesterolemia, unspecified: Secondary | ICD-10-CM

## 2011-10-17 DIAGNOSIS — M129 Arthropathy, unspecified: Secondary | ICD-10-CM

## 2011-10-17 DIAGNOSIS — N390 Urinary tract infection, site not specified: Secondary | ICD-10-CM

## 2011-10-17 DIAGNOSIS — I5189 Other ill-defined heart diseases: Secondary | ICD-10-CM

## 2011-10-17 DIAGNOSIS — G35 Multiple sclerosis: Secondary | ICD-10-CM

## 2011-10-17 DIAGNOSIS — R5383 Other fatigue: Secondary | ICD-10-CM

## 2011-10-17 NOTE — Patient Instructions (Signed)
You can get your results through our phone system.  Follow the instructions on the blue card. I'll request your records.  Take care.  Keep the urology and cardiology appointment.

## 2011-10-17 NOTE — Assessment & Plan Note (Signed)
2012- admission to [facility].  hgb 8-9 with normal ferritin, b12 and folate.  FOBT neg, iron level low at 29.  Will start iron repletion and recheck in 1 month.

## 2011-10-17 NOTE — Assessment & Plan Note (Signed)
Clinically improved, requesting records.

## 2011-10-17 NOTE — Assessment & Plan Note (Signed)
Check BMET given the recent edema and admission.  >25 min spent with face to face with patient, >50% counseling, see addendum re: records from [facility].

## 2011-10-17 NOTE — Progress Notes (Addendum)
[facility] admission 11/12.  Had rib fracture but then was back for readmit with tachycardia, UTI, possible PNA.  I don't have discharge summary yet.  H&P reviewed.    She had burning in the R arm that is better after urinating.  She has some hesitancy with urination.  She has f/u with [facility] 10/26/11.  I told her I want her to keep this.  No burning with urination itself.  Still with L lower chest pain at the site of the prev rib fractures.  The pain is better than it was.  No fevers, no cough now - cough resolved from prev.    She was fatigued and wanted to strip down her med list to the eliminate polypharmacy as a possible cause.    Meds, vitals, and allergies reviewed.   ROS: See HPI.  Otherwise, noncontributory.  nad ncat Mmm rrr Ctab, L lower ribs minimally ttp abd soft, not ttp Ext w/trace edema   ----addendum------ Admitted with elevated troponin thought to be due to demand ischemia in setting of NAV with AGE.  Possible concurrent UTI.  Transaminitis resolved and abd u/s unremarkable, hepatitis screen negative.  Anemia noted with hgb 8-9 with FOBT neg.  Diastolic dysfxn noted on echo.  Beta blocker started during admission.

## 2011-10-17 NOTE — Assessment & Plan Note (Signed)
Requesting records.   

## 2011-10-18 ENCOUNTER — Ambulatory Visit (INDEPENDENT_AMBULATORY_CARE_PROVIDER_SITE_OTHER): Payer: Medicare Other | Admitting: Cardiovascular Disease

## 2011-10-18 ENCOUNTER — Encounter: Payer: Self-pay | Admitting: Cardiovascular Disease

## 2011-10-18 ENCOUNTER — Encounter: Payer: Medicare Other | Admitting: Cardiovascular Disease

## 2011-10-18 ENCOUNTER — Telehealth: Payer: Self-pay | Admitting: Family Medicine

## 2011-10-18 DIAGNOSIS — I2510 Atherosclerotic heart disease of native coronary artery without angina pectoris: Secondary | ICD-10-CM

## 2011-10-18 DIAGNOSIS — R000 Tachycardia, unspecified: Secondary | ICD-10-CM

## 2011-10-18 DIAGNOSIS — R5381 Other malaise: Secondary | ICD-10-CM

## 2011-10-18 DIAGNOSIS — R5383 Other fatigue: Secondary | ICD-10-CM

## 2011-10-18 DIAGNOSIS — M25519 Pain in unspecified shoulder: Secondary | ICD-10-CM

## 2011-10-18 DIAGNOSIS — E785 Hyperlipidemia, unspecified: Secondary | ICD-10-CM

## 2011-10-18 DIAGNOSIS — R0602 Shortness of breath: Secondary | ICD-10-CM

## 2011-10-18 DIAGNOSIS — E7800 Pure hypercholesterolemia, unspecified: Secondary | ICD-10-CM

## 2011-10-18 HISTORY — DX: Atherosclerotic heart disease of native coronary artery without angina pectoris: I25.10

## 2011-10-18 LAB — BASIC METABOLIC PANEL
Calcium: 8.9 mg/dL (ref 8.4–10.5)
GFR: 77.4 mL/min (ref >60.00)
Glucose, Bld: 91 mg/dL (ref 70–99)
Sodium: 138 mEq/L (ref 135–145)

## 2011-10-18 MED ORDER — METOPROLOL TARTRATE 25 MG PO TABS: 25.0000 mg | ORAL_TABLET | Freq: Two times a day (BID) | ORAL | Status: DC

## 2011-10-18 MED ORDER — ATORVASTATIN CALCIUM 40 MG PO TABS: 40.0000 mg | ORAL_TABLET | Freq: Every day | ORAL | Status: DC

## 2011-10-18 MED ORDER — ASPIRIN EC 81 MG PO TBEC: 81.0000 mg | DELAYED_RELEASE_TABLET | Freq: Every day | ORAL | Status: DC

## 2011-10-18 NOTE — Progress Notes (Signed)
Addended by: Lars Mage on: 10/18/2011 12:00 AM   Modules accepted: Orders

## 2011-10-18 NOTE — Progress Notes (Signed)
Patient ID: Cynthia Price, female    DOB: Oct 26, 1946, 65 y.o.   MRN: [id number]  HPI Comments: Cynthia Price is a 65 year old woman with multiple sclerosis, depression, thrombocytosis, restless leg syndrome, scoliosis, kyphosis, history of multiple falls with recent fall leading to left rib fracture on October 28 presenting to [facility] after she started vomiting. In the emergency room, she was noted to be tachycardic with heart rate of 150 beats per minute, mild troponin elevation. Cardiology was consult and for further evaluation.  Heart rate came down with pain medication and started on metoprolol. Since her discharge, she has had severe right shoulder discomfort radiating into her neck. She has difficulty lying flat for prolonged periods of time. She is participating in physical therapy.  She denies any shortness of breath or chest pain. She is uncertain if she was supposed to have a stress test prior to her visit today.  Cholesterol in the hospital showed total cholesterol 260, LDL 140, hemoglobin A1c of 6  Echocardiogram in the hospital September 29, 2011 shows normal LV function, right ventricular systolic pressure estimated at 30-40 mm mercury, mild to moderate TR, diastolic dysfunction  EKG today shows normal sinus rhythm with rate 63 beats per minute with no significant ST or T wave changes   Outpatient Encounter Prescriptions as of 10/18/2011  Medication Sig Dispense Refill  . Ferrous Sulfate (IRON) 325 (65 FE) MG TABS Take 325 mg by mouth 1 day or 1 dose.        . gabapentin (NEURONTIN) 600 MG tablet Take 600-1,200 mg by mouth 4 (four) times daily.        . hydroxyurea (HYDREA) 500 MG capsule 500 mg. Take one by mouth Monday, Tuesday, Wed, Thursday, Friday       . metoprolol tartrate (LOPRESSOR) 25 MG tablet Take 1 tablet (25 mg total) by mouth 2 (two) times daily.  180 tablet  4  . Oxcarbazepine (TRILEPTAL) 300 MG tablet Take 300 mg by mouth 2 (two) times daily.        . pantoprazole (PROTONIX)  40 MG tablet Take 40 mg by mouth 2 (two) times daily.        Marland Kitchen rOPINIRole (REQUIP) 0.25 MG tablet Take 1 tablet (0.25 mg total) by mouth 4 (four) times daily.  120 tablet  5  . aspirin EC 81 MG tablet Take 1 tablet (81 mg total) by mouth daily.  150 tablet  4  . atorvastatin (LIPITOR) 40 MG tablet Take 1 tablet (40 mg total) by mouth daily.  30 tablet  11     Review of Systems  HENT: Negative.   Eyes: Negative.   Respiratory: Negative.   Cardiovascular: Negative.   Gastrointestinal: Negative.   Musculoskeletal: Positive for back pain, arthralgias and gait problem.  Skin: Negative.   Neurological: Positive for weakness.  Hematological: Negative.   Psychiatric/Behavioral: Negative.   All other systems reviewed and are negative.    BP 92/62  Pulse 63  Ht 5\' 6"  (1.676 m)  Wt 128 lb 8 oz (58.287 kg)  BMI 20.74 kg/m2  Physical Exam  Nursing note and vitals reviewed. Constitutional: She is oriented to person, place, and time. She appears well-developed and well-nourished.       Appears frail, difficulty with ambulation  HENT:  Head: Normocephalic.  Nose: Nose normal.  Mouth/Throat: Oropharynx is clear and moist.  Eyes: Conjunctivae are normal. Pupils are equal, round, and reactive to light.  Neck: Normal range of motion. Neck supple. No JVD present.  Cardiovascular: Normal rate, regular rhythm, S1 normal, S2 normal, normal heart sounds and intact distal pulses.  Exam reveals no gallop and no friction rub.   No murmur heard. Pulmonary/Chest: Effort normal and breath sounds normal. No respiratory distress. She has no wheezes. She has no rales. She exhibits no tenderness.  Abdominal: Soft. Bowel sounds are normal. She exhibits no distension. There is no tenderness.  Musculoskeletal: Normal range of motion. She exhibits no edema and no tenderness.  Lymphadenopathy:    She has no cervical adenopathy.  Neurological: She is alert and oriented to person, place, and time. Coordination  normal.  Skin: Skin is warm and dry. No rash noted. No erythema.  Psychiatric: She has a normal mood and affect. Her behavior is normal. Judgment and thought content normal.         Assessment and Plan

## 2011-10-18 NOTE — Assessment & Plan Note (Signed)
Cholesterol in the hospital 272. We will start Lipitor 20 mg daily for several weeks before titrating to 40 mg daily. I hope that this does not cause any muscle weakness as her gait is already poor. We will check her cholesterol in 3 months time.

## 2011-10-18 NOTE — Patient Instructions (Addendum)
You are doing well. Please start aspirin 81 mg daily Please start lipitor/atorvastatin  1/2 a pil a day for three weeks Then increase to a full pill  We will contact you to schedule a lab check in three months  Please call us if you have new issues that need to be addressed before your next appt.  The office will contact you for a follow up Appt. In 6 months

## 2011-10-18 NOTE — Telephone Encounter (Signed)
LMOVM to return call.

## 2011-10-18 NOTE — Assessment & Plan Note (Addendum)
Suspected underlying coronary artery disease with elevated cardiac enzymes in the setting of profound tachycardia. We will hold off on a stress test at this time given severe right shoulder pain and inability to lie flat for the test. We will treat her aggressively. We have suggested she start low dose aspirin 81 mg daily.

## 2011-10-18 NOTE — Assessment & Plan Note (Signed)
Heart rate significantly improved after recovering from nausea vomiting episode and fall with left rib fracture. I am concerned about her low blood pressure and will ask her to monitor her blood pressure at home. If blood pressure continues to run low, we will cut her metoprolol tartrate in half.

## 2011-10-18 NOTE — Telephone Encounter (Signed)
Call pt. Anemia noted on labs at [facility].  Have her take iron (otc) 325 mg a day, constipation precaution, and then recheck CBC and iron levels her in lab in 1 month.  Orders are in. Thanks.

## 2011-10-18 NOTE — Telephone Encounter (Signed)
Patient advised.  Lab appt scheduled.  

## 2011-10-19 ENCOUNTER — Telehealth: Payer: Self-pay | Admitting: *Deleted

## 2011-10-19 NOTE — Telephone Encounter (Signed)
Referral made to [facility], for 01/10/12 for urinary incontinence, frequency. Also notified pt per Dr. Mariah Milling, to monitor BP at home, if continues to run low, <100 systolic to call the office and we will cut metoprolol in 1/2 to 12.5mg  bid.

## 2011-10-28 ENCOUNTER — Encounter: Payer: Self-pay | Admitting: Family Medicine

## 2011-10-28 ENCOUNTER — Telehealth: Payer: Self-pay | Admitting: *Deleted

## 2011-10-28 ENCOUNTER — Ambulatory Visit (INDEPENDENT_AMBULATORY_CARE_PROVIDER_SITE_OTHER): Payer: Medicare Other | Admitting: Family Medicine

## 2011-10-28 VITALS — BP 98/60 | HR 56 | Temp 98.2°F | Wt 129.0 lb

## 2011-10-28 DIAGNOSIS — R300 Dysuria: Secondary | ICD-10-CM

## 2011-10-28 DIAGNOSIS — N390 Urinary tract infection, site not specified: Secondary | ICD-10-CM

## 2011-10-28 DIAGNOSIS — E7800 Pure hypercholesterolemia, unspecified: Secondary | ICD-10-CM

## 2011-10-28 LAB — POCT URINALYSIS DIPSTICK
Glucose, UA: NEGATIVE
Ketones, UA: NEGATIVE
Spec Grav, UA: 1.015
Urobilinogen, UA: NEGATIVE

## 2011-10-28 MED ORDER — SULFAMETHOXAZOLE-TRIMETHOPRIM 800-160 MG PO TABS: 1.0000 | ORAL_TABLET | Freq: Two times a day (BID) | ORAL | Status: AC

## 2011-10-28 NOTE — Telephone Encounter (Signed)
Patient scheduled for 3:45.  Could not get transportation for a 12:15 appt.

## 2011-10-28 NOTE — Telephone Encounter (Signed)
Pt seen for hosp f/u 11 days ago, had uti while in hosp. Pt says she is still having symptoms and wants to know if she can just come in an give a urine specimen.

## 2011-10-28 NOTE — Progress Notes (Signed)
Aches worse on lipitor 40mg , will dec to 20mg  a day and see if she can tolerate.   Dysuria: "I can't go as good."  Changed in last few days.  More aches in her arms, worse recently, worse with urination.  This was present previously, but is worse now.   duration of symptoms:  A few days.   abdominal pain: no Fevers: none known back pain: at baseline vomiting:no  Rib pain is better.   Meds, vitals, and allergies reviewed.   ROS: See HPI.  Otherwise negative.    GEN: nad, alert and oriented, thin appearing- at baseline, slow gait at baseline, speech at baseline HEENT: mucous membranes moist NECK: no LA CV: rrr.  PULM: ctab, no inc wob, chest wall not ttp ABD: soft, +bs, suprapubic area not tender SKIN: no acute rash BACK: no CVA pain

## 2011-10-28 NOTE — Telephone Encounter (Signed)
If she's going to come in for a specimen, I'd rather see her, see the dip, get the ucx and take care of med changes at that point.  See if she can get on the schedule for the 12:15 slot.

## 2011-10-28 NOTE — Patient Instructions (Addendum)
Try taking 1/2 tab of the lipitor and see if you can tolerate that.  If you can't (and you have more spasms), the stop it totally.   Drink plenty of water and start the antibiotics today.  We'll contact you with your lab report.  Take care.

## 2011-10-30 LAB — URINE CULTURE: Colony Count: NO GROWTH

## 2011-10-31 ENCOUNTER — Telehealth: Payer: Self-pay | Admitting: Internal Medicine

## 2011-10-31 NOTE — Telephone Encounter (Signed)
Patient called and stated the medication sulfa isn't working on her UTI.  Please advise.

## 2011-11-01 ENCOUNTER — Telehealth: Payer: Self-pay | Admitting: Internal Medicine

## 2011-11-01 NOTE — Telephone Encounter (Signed)
She has no growth on the Ucx.  I would finish the abx anyway and then f/u with uro.  She can take OTC AZO for 2 days for short term relief of dysuria.

## 2011-11-01 NOTE — Assessment & Plan Note (Signed)
Dec to 20mg  of lipitor and see if that helps with aches.

## 2011-11-01 NOTE — Assessment & Plan Note (Signed)
Septra, ucx, and f/u with uro.  Nontoxic.

## 2011-11-02 NOTE — Telephone Encounter (Signed)
LMOVM

## 2011-11-02 NOTE — Telephone Encounter (Signed)
Patient advised.  She states she has the Urology appt on 11/16/11.  Her next follow up with you is scheduled on 11/08/11 and it states "follow up after Neuro appt".  She is not aware of a Neuro appt.  I asked her to call her Neurology office to question it.  Does she need this follow up with you if she has not see Neuro?

## 2011-11-02 NOTE — Telephone Encounter (Signed)
No, I would cancel the OV here, f/u with uro as scheduled.  And if she has f/u with neuro scheduled, then I'd keep it.  Thanks.

## 2011-11-08 ENCOUNTER — Ambulatory Visit: Payer: BC Managed Care – HMO | Admitting: Family Medicine

## 2011-11-10 ENCOUNTER — Telehealth: Payer: Self-pay

## 2011-11-10 MED ORDER — METOPROLOL TARTRATE 25 MG PO TABS: 25.0000 mg | ORAL_TABLET | Freq: Two times a day (BID) | ORAL | Status: DC

## 2011-11-10 NOTE — Telephone Encounter (Signed)
Refill metoprolol 25 mg bid

## 2011-11-17 ENCOUNTER — Other Ambulatory Visit (INDEPENDENT_AMBULATORY_CARE_PROVIDER_SITE_OTHER): Payer: Medicare Other

## 2011-11-17 DIAGNOSIS — E785 Hyperlipidemia, unspecified: Secondary | ICD-10-CM

## 2011-11-17 DIAGNOSIS — D649 Anemia, unspecified: Secondary | ICD-10-CM

## 2011-11-17 LAB — CBC WITH DIFFERENTIAL/PLATELET
Basophils Relative: 0.8 % (ref 0.0–3.0)
Eosinophils Absolute: 0.1 10*3/uL (ref 0.0–0.7)
Lymphocytes Relative: 18.4 % (ref 12.0–46.0)
MCHC: 33.5 g/dL (ref 30.0–36.0)
Monocytes Absolute: 0.7 10*3/uL (ref 0.1–1.0)
Neutrophils Relative %: 66.2 % (ref 43.0–77.0)
Platelets: 373 10*3/uL (ref 150.0–400.0)
RBC: 3.74 Mil/uL — ABNORMAL LOW (ref 3.87–5.11)
WBC: 5.6 10*3/uL (ref 4.5–10.5)

## 2011-11-17 LAB — LIPID PANEL
Cholesterol: 230 mg/dL — ABNORMAL HIGH (ref 0–200)
HDL: 96.3 mg/dL (ref >39.00)
Triglycerides: 85 mg/dL (ref 0.0–149.0)
VLDL: 17 mg/dL (ref 0.0–40.0)

## 2011-11-17 LAB — HEPATIC FUNCTION PANEL
Albumin: 4.1 g/dL (ref 3.5–5.2)
Alkaline Phosphatase: 92 U/L (ref 39–117)
Total Protein: 7.6 g/dL (ref 6.0–8.3)

## 2011-11-17 LAB — IBC PANEL
Iron: 57 ug/dL (ref 42–145)
Saturation Ratios: 14.8 % — ABNORMAL LOW (ref 20.0–50.0)
Transferrin: 275.8 mg/dL (ref 212.0–360.0)

## 2011-11-17 NOTE — Progress Notes (Signed)
Addended by: Alvina Chou on: 11/17/2011 02:48 PM   Modules accepted: Orders

## 2011-12-04 ENCOUNTER — Encounter: Payer: Self-pay | Admitting: Family Medicine

## 2011-12-04 DIAGNOSIS — R339 Retention of urine, unspecified: Secondary | ICD-10-CM | POA: Insufficient documentation

## 2011-12-07 NOTE — Telephone Encounter (Signed)
Opened in error by Pam Clement 

## 2011-12-25 ENCOUNTER — Encounter: Payer: Self-pay | Admitting: Family Medicine

## 2012-01-16 ENCOUNTER — Other Ambulatory Visit: Payer: Self-pay | Admitting: Family Medicine

## 2012-01-16 NOTE — Telephone Encounter (Signed)
Patient is requesting a prescription refill on her Hydrea, to be called to [facility] in [place].

## 2012-01-17 MED ORDER — HYDROXYUREA 500 MG PO CAPS: ORAL_CAPSULE | ORAL | Status: DC

## 2012-01-17 NOTE — Telephone Encounter (Signed)
sent 

## 2012-01-19 ENCOUNTER — Ambulatory Visit (INDEPENDENT_AMBULATORY_CARE_PROVIDER_SITE_OTHER): Payer: Medicare Other

## 2012-01-19 DIAGNOSIS — E785 Hyperlipidemia, unspecified: Secondary | ICD-10-CM

## 2012-01-20 DIAGNOSIS — G35B0 Primary progressive multiple sclerosis, unspecified: Secondary | ICD-10-CM | POA: Insufficient documentation

## 2012-01-20 DIAGNOSIS — G2581 Restless legs syndrome: Secondary | ICD-10-CM | POA: Insufficient documentation

## 2012-01-20 DIAGNOSIS — G35 Multiple sclerosis: Secondary | ICD-10-CM | POA: Insufficient documentation

## 2012-01-20 LAB — LIPID PANEL
Chol/HDL Ratio: 2.6 ratio units (ref 0.0–5.0)
HDL: 97 mg/dL (ref >39)
LDL Calculated: 149 mg/dL — ABNORMAL HIGH (ref 0–99)
Triglycerides: 46 mg/dL (ref 0–149)

## 2012-01-20 LAB — HEPATIC FUNCTION PANEL
ALT: 16 IU/L (ref 0–44)
Total Bilirubin: 0.3 mg/dL (ref 0.0–1.2)
Total Protein: 6.8 g/dL (ref 6.0–8.5)

## 2012-02-16 DIAGNOSIS — M48061 Spinal stenosis, lumbar region without neurogenic claudication: Secondary | ICD-10-CM | POA: Insufficient documentation

## 2012-02-16 DIAGNOSIS — M5417 Radiculopathy, lumbosacral region: Secondary | ICD-10-CM | POA: Insufficient documentation

## 2012-03-15 ENCOUNTER — Encounter: Payer: Self-pay | Admitting: Family Medicine

## 2012-03-15 ENCOUNTER — Ambulatory Visit (INDEPENDENT_AMBULATORY_CARE_PROVIDER_SITE_OTHER): Payer: Medicare Other | Admitting: Family Medicine

## 2012-03-15 VITALS — BP 126/74 | HR 60 | Temp 97.5°F | Wt 133.8 lb

## 2012-03-15 DIAGNOSIS — Z9181 History of falling: Secondary | ICD-10-CM

## 2012-03-15 NOTE — Patient Instructions (Signed)
I would call about PT for balance and fall prevention.  I would get a toe pad for the first web space.   I'll await input from the doctors at [facility].

## 2012-03-15 NOTE — Progress Notes (Signed)
She continues to have R arm pain.  She has f/u with [facility] neuro and urology pending. She is using home urinary catheterization at night with some relief.  She was treated for UTI.  She is trying to have the neuro and uro clinic discuss her case jointly.    She fell recently and hit her head, 2 days ago.  She didn't pick up her L leg high enough and then her foot caught.  L eye bruised above and below the orbit.  No LOC.  She can chew and eye movement isn't restricted.  No other bleeding or bruising other than near L eye.    Meds, vitals, and allergies reviewed.   ROS: See HPI.  Otherwise, noncontributory.  ncat except for bruising above and below L orbit and puffy skin along the L maxilla.  PERRL, EOMI, she isn't point tender along the orbit Normal jaw motion Tm wnl Op wnl Neck supple rrr ctab

## 2012-03-16 DIAGNOSIS — Z9181 History of falling: Secondary | ICD-10-CM | POA: Insufficient documentation

## 2012-03-16 NOTE — Assessment & Plan Note (Signed)
Okay for outpatient f/u and no indication for imaging at this point.  She'll call about PT for fall prevention.  Her son thinks she did better prev with PT.  Also her L first toe is laterally deviated and that could affect gait.  She can use a webspace cushion for that.

## 2012-04-17 ENCOUNTER — Other Ambulatory Visit: Payer: Self-pay | Admitting: *Deleted

## 2012-04-17 NOTE — Telephone Encounter (Signed)
Faxed refill request   

## 2012-04-18 MED ORDER — ROPINIROLE HCL 0.25 MG PO TABS: 0.2500 mg | ORAL_TABLET | Freq: Four times a day (QID) | ORAL | Status: DC

## 2012-04-18 NOTE — Telephone Encounter (Signed)
Sent!

## 2012-05-03 ENCOUNTER — Ambulatory Visit: Payer: Self-pay | Admitting: Podiatry

## 2012-05-03 ENCOUNTER — Emergency Department: Payer: Self-pay | Admitting: Emergency Medicine

## 2012-05-03 LAB — COMPREHENSIVE METABOLIC PANEL
Albumin: 3.8 g/dL (ref 3.4–5.0)
Anion Gap: 7 (ref 7–16)
Calcium, Total: 9.1 mg/dL (ref 8.5–10.1)
Chloride: 103 mmol/L (ref 98–107)
Creatinine: 0.84 mg/dL (ref 0.60–1.30)
EGFR (Non-African Amer.): >60
Potassium: 3.8 mmol/L (ref 3.5–5.1)
SGPT (ALT): 43 U/L
Sodium: 139 mmol/L (ref 136–145)

## 2012-05-03 LAB — CBC
HCT: 37.8 % (ref 35.0–47.0)
MCH: 31.3 pg (ref 26.0–34.0)
MCV: 96 fL (ref 80–100)
Platelet: 333 10*3/uL (ref 150–440)
RBC: 3.95 10*6/uL (ref 3.80–5.20)
RDW: 15.4 % — ABNORMAL HIGH (ref 11.5–14.5)
WBC: 5.8 10*3/uL (ref 3.6–11.0)

## 2012-05-03 LAB — URINALYSIS, COMPLETE
Bacteria: NONE SEEN
Blood: NEGATIVE
Glucose,UR: NEGATIVE mg/dL (ref 0–75)
Ketone: NEGATIVE
Nitrite: NEGATIVE
Ph: 8 (ref 4.5–8.0)
Protein: NEGATIVE
RBC,UR: 2 /HPF (ref 0–5)
Squamous Epithelial: 2
Transitional Epi: <1

## 2012-05-03 LAB — PROTIME-INR: Prothrombin Time: 12.5 secs (ref 11.5–14.7)

## 2012-05-14 ENCOUNTER — Ambulatory Visit (INDEPENDENT_AMBULATORY_CARE_PROVIDER_SITE_OTHER): Payer: Medicare Other | Admitting: Cardiovascular Disease

## 2012-05-14 ENCOUNTER — Encounter: Payer: Self-pay | Admitting: Cardiovascular Disease

## 2012-05-14 VITALS — BP 100/64 | HR 57 | Ht 66.0 in | Wt 133.0 lb

## 2012-05-14 DIAGNOSIS — I519 Heart disease, unspecified: Secondary | ICD-10-CM

## 2012-05-14 DIAGNOSIS — E7800 Pure hypercholesterolemia, unspecified: Secondary | ICD-10-CM

## 2012-05-14 DIAGNOSIS — R000 Tachycardia, unspecified: Secondary | ICD-10-CM

## 2012-05-14 DIAGNOSIS — I82409 Acute embolism and thrombosis of unspecified deep veins of unspecified lower extremity: Secondary | ICD-10-CM

## 2012-05-14 DIAGNOSIS — I2510 Atherosclerotic heart disease of native coronary artery without angina pectoris: Secondary | ICD-10-CM

## 2012-05-14 DIAGNOSIS — I5189 Other ill-defined heart diseases: Secondary | ICD-10-CM

## 2012-05-14 HISTORY — DX: Acute embolism and thrombosis of unspecified deep veins of unspecified lower extremity: I82.409

## 2012-05-14 MED ORDER — RIVAROXABAN 15 MG PO TABS: 15.0000 mg | ORAL_TABLET | Freq: Two times a day (BID) | ORAL | Status: DC

## 2012-05-14 NOTE — Assessment & Plan Note (Signed)
Heart rate is well-controlled on today's visit. No medication changes made.

## 2012-05-14 NOTE — Patient Instructions (Addendum)
You are doing well. No medication changes were made.  We will order a repeat leg ultrasound in 3 months to check the DVT Wear the TED hose We will do lab work in the next week or two to check cholesterol  Please call us if you have new issues that need to be addressed before your next appt.  Your physician wants you to follow-up in: 6 months.  You will receive a reminder letter in the mail two months in advance. If you don't receive a letter, please call our office to schedule the follow-up appointment.

## 2012-05-14 NOTE — Progress Notes (Signed)
Patient ID: Denisia Harpole, female    DOB: 1946/11/23, 66 y.o.   MRN: [id number]  HPI Comments: Ms. Castell is a 66 year old woman with multiple sclerosis, depression, thrombocytosis, restless leg syndrome, scoliosis, kyphosis, history of multiple falls with recent fall leading to left rib fracture on October 28 presenting to [facility] after she started vomiting. In the emergency room, she was noted to be tachycardic with heart rate of 150 beats per minute, mild troponin elevation.  Heart rate came down with pain medication and started on metoprolol. No prior stress test as she was unable to lie flat.  She reports she developed left lower terminates swelling, went to the emergency room 05/03/2012 with diagnosis of nonocclusive left lower extremity popliteal thrombus. She was started on xarelto 50 mg twice a day. She has significant swelling in her left foot. Her legs feel tired. She also reports having problems with her bladder. She attributes much of her problems to previous falls when she suffered rib fractures.  Cholesterol in the hospital showed total cholesterol 260, LDL 140, hemoglobin A1c of 6  Echocardiogram in the hospital September 29, 2011 shows normal LV function, right ventricular systolic pressure estimated at 30-40 mm mercury, mild to moderate TR, diastolic dysfunction  Ultrasound from the hospital shows nonocclusive DVT in the left popliteal vein  EKG today shows normal sinus rhythm with rate 57 beats per minute with no significant ST or T wave changes   Outpatient Encounter Prescriptions as of 05/14/2012  Medication Sig Dispense Refill  . atorvastatin (LIPITOR) 40 MG tablet Take 40 mg by mouth daily.       . citalopram (CELEXA) 10 MG tablet Take 10 mg by mouth daily as needed.      . gabapentin (NEURONTIN) 600 MG tablet Take 600-1,200 mg by mouth 4 (four) times daily.        . hydroxyurea (HYDREA) 500 MG capsule Take one by mouth Monday, Tuesday, Wed, Thursday, Friday  90 capsule  1  .  metoprolol tartrate (LOPRESSOR) 25 MG tablet Take 1 tablet (25 mg total) by mouth 2 (two) times daily.  180 tablet  3  . Oxcarbazepine (TRILEPTAL) 300 MG tablet Take 300 mg by mouth daily.       . pantoprazole (PROTONIX) 40 MG tablet Take 40 mg by mouth 2 (two) times daily.        . Rivaroxaban (XARELTO) 15 MG TABS tablet Take 1 tablet (15 mg total) by mouth 2 (two) times daily.  60 tablet  6  . rOPINIRole (REQUIP) 0.25 MG tablet Take 1 tablet (0.25 mg total) by mouth 4 (four) times daily.  120 tablet  5  . traMADol (ULTRAM) 50 MG tablet Take 50 mg by mouth every 6 (six) hours as needed.       Review of Systems  HENT: Negative.   Eyes: Negative.   Respiratory: Negative.   Cardiovascular: Positive for leg swelling.  Gastrointestinal: Negative.   Musculoskeletal: Positive for back pain, arthralgias and gait problem.  Skin: Negative.   Neurological: Positive for weakness.  Hematological: Negative.   Psychiatric/Behavioral: Negative.   All other systems reviewed and are negative.    BP 100/64  Pulse 57  Ht 5\' 6"  (1.676 m)  Wt 133 lb (60.328 kg)  BMI 21.47 kg/m2  Physical Exam  Nursing note and vitals reviewed. Constitutional: She is oriented to person, place, and time. She appears well-developed and well-nourished.  HENT:  Head: Normocephalic.  Nose: Nose normal.  Mouth/Throat: Oropharynx is clear and  moist.  Eyes: Conjunctivae are normal. Pupils are equal, round, and reactive to light.  Neck: Normal range of motion. Neck supple. No JVD present.  Cardiovascular: Normal rate, regular rhythm, S1 normal, S2 normal, normal heart sounds and intact distal pulses.  Exam reveals no gallop and no friction rub.   No murmur heard. Pulmonary/Chest: Effort normal and breath sounds normal. No respiratory distress. She has no wheezes. She has no rales. She exhibits no tenderness.  Abdominal: Soft. Bowel sounds are normal. She exhibits no distension. There is no tenderness.  Musculoskeletal:  Normal range of motion. She exhibits edema. She exhibits no tenderness.       Swelling of left lower extremity around the foot  Lymphadenopathy:    She has no cervical adenopathy.  Neurological: She is alert and oriented to person, place, and time. Coordination normal.  Skin: Skin is warm and dry. No rash noted. No erythema.  Psychiatric: She has a normal mood and affect. Her behavior is normal. Judgment and thought content normal.         Assessment and Plan

## 2012-05-14 NOTE — Assessment & Plan Note (Signed)
Appears relatively euvolemic. No signs of heart failure by clinical exam. We have asked her to continue to monitor her weight. Weight is up 5 pounds from last year.

## 2012-05-14 NOTE — Assessment & Plan Note (Signed)
We will check her blood work in the next week as she is on a statin.

## 2012-05-14 NOTE — Assessment & Plan Note (Signed)
Nonocclusive left popliteal DVT. She is on anticoagulation. We will refill her prescription and check venous duplex in 3 months time. We have suggested she wear compression hose for leg swelling.

## 2012-05-17 ENCOUNTER — Other Ambulatory Visit: Payer: Medicare Other

## 2012-05-21 ENCOUNTER — Other Ambulatory Visit (INDEPENDENT_AMBULATORY_CARE_PROVIDER_SITE_OTHER): Payer: Medicare Other

## 2012-05-21 DIAGNOSIS — I2510 Atherosclerotic heart disease of native coronary artery without angina pectoris: Secondary | ICD-10-CM

## 2012-05-22 LAB — LIPID PANEL
Cholesterol, Total: 190 mg/dL (ref 100–199)
HDL: 98 mg/dL (ref >39)
VLDL Cholesterol Cal: 10 mg/dL (ref 5–40)

## 2012-05-22 LAB — HEPATIC FUNCTION PANEL
AST: 47 IU/L — ABNORMAL HIGH (ref 0–40)
Albumin: 4.2 g/dL (ref 3.6–4.8)
Alkaline Phosphatase: 125 IU/L (ref 25–165)
Bilirubin, Direct: 0.12 mg/dL (ref 0.00–0.40)
Total Protein: 6.9 g/dL (ref 6.0–8.5)

## 2012-05-23 ENCOUNTER — Telehealth: Payer: Self-pay

## 2012-05-23 NOTE — Telephone Encounter (Signed)
LMTCB

## 2012-05-23 NOTE — Telephone Encounter (Signed)
Patient is having problems with blood clots in her nose for about 10 days. She is wondering if it is coming from the xarelto. Please call with any advise.

## 2012-05-23 NOTE — Telephone Encounter (Signed)
Discussed with Dr. Mariah Milling who says to continue on Xarelto as directed.  Reassure pt it takes a great amt of bleeding from nose to drop Hgb.  She should monitor and let us know should this get worse.  Otherwise should stay on same dose of xarelto.  He also wanted me to reassure pt she may always have some edema in the LLE.  She should wear the compression stockings to help this.  I called pt re: above.  She verb. Understanding. Says she has compression stockings at home,e but has not been wearing these as she should.  She will monitor nosebleed and LE and call us should these get worse/change.

## 2012-05-23 NOTE — Telephone Encounter (Signed)
Pt was started on xarelto during hosp for left popliteal DVT about 3 weeks ago.  For the last 10 days she has noticed "clumps" of blood coming out of nose intermittently.   She says this does not happen daily, but on occasion.   She denies blood in stool, urine, coughing up blood, vomiting blood or any other s/s bleeding.  She also says when she was here 05/08/12 she showed Dr. Mariah Milling edema in LLE.  She says he reassured her this would take time to improve.  She says this has not improved at all, although it has not gotten any worse.  She wonders how long she should give it to improve.  She is not taking any diuretics and wonders if she needs a small dose.  I reassured her I would discuss both issues with Dr. Mariah Milling and get back with her.  Understanding verb.

## 2012-06-06 ENCOUNTER — Ambulatory Visit: Payer: Medicare Other | Admitting: Family Medicine

## 2012-06-07 ENCOUNTER — Encounter: Payer: Self-pay | Admitting: Family Medicine

## 2012-06-07 ENCOUNTER — Ambulatory Visit (INDEPENDENT_AMBULATORY_CARE_PROVIDER_SITE_OTHER): Payer: Medicare Other | Admitting: Family Medicine

## 2012-06-07 VITALS — BP 102/78 | HR 64 | Temp 98.2°F | Ht 63.75 in | Wt 138.0 lb

## 2012-06-07 DIAGNOSIS — R82998 Other abnormal findings in urine: Secondary | ICD-10-CM

## 2012-06-07 DIAGNOSIS — R8290 Unspecified abnormal findings in urine: Secondary | ICD-10-CM

## 2012-06-07 DIAGNOSIS — N390 Urinary tract infection, site not specified: Secondary | ICD-10-CM

## 2012-06-07 LAB — POCT URINALYSIS DIPSTICK
Ketones, UA: NEGATIVE
Spec Grav, UA: 1.01
Urobilinogen, UA: 0.2
pH, UA: 7.5

## 2012-06-07 MED ORDER — CIPROFLOXACIN HCL 250 MG PO TABS: 250.0000 mg | ORAL_TABLET | Freq: Two times a day (BID) | ORAL | Status: AC

## 2012-06-07 NOTE — Patient Instructions (Addendum)
Drink plenty of water and start the antibiotics today.  We'll contact you with your lab report.  Take care.   

## 2012-06-07 NOTE — Assessment & Plan Note (Signed)
Start cipro and I'll ask staff to call [facility] about coordination of care [id number] RSD.  Ucx pending.  Nontoxic.  She agrees with plan.

## 2012-06-07 NOTE — Progress Notes (Signed)
Dysuria: yes duration of symptoms: a few days abdominal pain: no fevers:no back pain: no new pain vomiting:no Intermittent caths at home.   Has had f/u with uro at [facility].   Continues to have R arm pain during episodes, possible RSD.   Meds, vitals, and allergies reviewed.   ROS: See HPI.  Otherwise negative.    GEN: nad, alert and oriented NECK: supple CV: rrr.  PULM: ctab, no inc wob ABD: soft, +bs, suprapubic area not tender EXT: 1+ edema SKIN: no acute rash

## 2012-06-12 ENCOUNTER — Other Ambulatory Visit: Payer: Self-pay | Admitting: *Deleted

## 2012-06-12 DIAGNOSIS — D473 Essential (hemorrhagic) thrombocythemia: Secondary | ICD-10-CM

## 2012-06-12 NOTE — Telephone Encounter (Signed)
Faxed refill request. Please advise.  

## 2012-06-13 MED ORDER — HYDROXYUREA 500 MG PO CAPS: ORAL_CAPSULE | ORAL | Status: DC

## 2012-06-13 NOTE — Telephone Encounter (Signed)
Patient notified by telephone and lab appointment was scheduled.

## 2012-06-13 NOTE — Telephone Encounter (Signed)
She's due for repeat CBC.  I would get it at her convenience in the next month.  Thanks.

## 2012-06-14 ENCOUNTER — Telehealth: Payer: Self-pay | Admitting: Family Medicine

## 2012-06-14 NOTE — Telephone Encounter (Signed)
Notify pt.  I talked to Dr. Betsy Coder at [facility].  I have asked her to facilitate a conversation between neuro and uro on the patient's behalf.  I would keep the f/u appointment with neuro as scheduled.

## 2012-06-15 NOTE — Telephone Encounter (Signed)
LMOVM

## 2012-06-15 NOTE — Telephone Encounter (Signed)
She can continue with 2 tabs tid.  Thanks.

## 2012-06-15 NOTE — Telephone Encounter (Signed)
Patient advised.  She is asking if there is anything else she can take for the RLS.  She says she is taking the Ropinirole and the Gabapentin.  She said you have given her the okay to take 2 Gabapentins three times a day and that helps but she wasn't sure she should continue to take that much.

## 2012-06-20 ENCOUNTER — Other Ambulatory Visit (INDEPENDENT_AMBULATORY_CARE_PROVIDER_SITE_OTHER): Payer: Medicare Other

## 2012-06-20 DIAGNOSIS — D473 Essential (hemorrhagic) thrombocythemia: Secondary | ICD-10-CM

## 2012-06-20 LAB — CBC WITH DIFFERENTIAL/PLATELET
Basophils Absolute: 0.1 10*3/uL (ref 0.0–0.1)
Eosinophils Relative: 2.3 % (ref 0.0–5.0)
HCT: 34.5 % — ABNORMAL LOW (ref 36.0–46.0)
Lymphs Abs: 1.6 10*3/uL (ref 0.7–4.0)
MCV: 96.1 fl (ref 78.0–100.0)
Monocytes Absolute: 1 10*3/uL (ref 0.1–1.0)
Platelets: 329 10*3/uL (ref 150.0–400.0)
RDW: 14 % (ref 11.5–14.6)

## 2012-06-21 ENCOUNTER — Other Ambulatory Visit: Payer: Self-pay

## 2012-06-21 ENCOUNTER — Encounter: Payer: Self-pay | Admitting: *Deleted

## 2012-06-21 MED ORDER — GABAPENTIN 600 MG PO TABS: 1200.0000 mg | ORAL_TABLET | Freq: Three times a day (TID) | ORAL | Status: DC

## 2012-06-21 NOTE — Telephone Encounter (Signed)
Pt request refill gabapentin to [facility]. Pt will be out of med today .Please advise.

## 2012-06-21 NOTE — Telephone Encounter (Signed)
Sent!

## 2012-06-23 ENCOUNTER — Emergency Department: Payer: Self-pay | Admitting: Emergency Medicine

## 2012-06-29 ENCOUNTER — Encounter: Payer: Self-pay | Admitting: Family Medicine

## 2012-06-29 ENCOUNTER — Ambulatory Visit (INDEPENDENT_AMBULATORY_CARE_PROVIDER_SITE_OTHER): Payer: Medicare Other | Admitting: Family Medicine

## 2012-06-29 ENCOUNTER — Ambulatory Visit: Payer: Self-pay | Admitting: Family Medicine

## 2012-06-29 VITALS — BP 102/60 | HR 86 | Temp 97.8°F | Wt 137.2 lb

## 2012-06-29 DIAGNOSIS — R109 Unspecified abdominal pain: Secondary | ICD-10-CM

## 2012-06-29 LAB — CBC WITH DIFFERENTIAL/PLATELET
Basophils Absolute: 0.1 10*3/uL (ref 0.0–0.1)
Basophils Relative: 2 % — ABNORMAL HIGH (ref 0–1)
Hemoglobin: 12.2 g/dL (ref 12.0–15.0)
MCHC: 32.4 g/dL (ref 30.0–36.0)
Monocytes Relative: 12 % (ref 3–12)
Neutro Abs: 3.7 10*3/uL (ref 1.7–7.7)
Neutrophils Relative %: 62 % (ref 43–77)
Platelets: 418 10*3/uL — ABNORMAL HIGH (ref 150–400)
RDW: 14.2 % (ref 11.5–15.5)

## 2012-06-29 LAB — HEPATIC FUNCTION PANEL
AST: 22 U/L (ref 0–37)
Albumin: 4 g/dL (ref 3.5–5.2)
Total Bilirubin: 0.3 mg/dL (ref 0.3–1.2)

## 2012-06-29 LAB — LIPASE: Lipase: 30 U/L (ref 0–75)

## 2012-06-29 NOTE — Progress Notes (Signed)
Nature conservation officer at [facility] [address] [place] [address] Phone: [phone] Fax: [phone]  Date:  06/29/2012   Name:  Cynthia Price   DOB:  Dec 07, 1945   MRN:  [id number]  PCP:  Crawford Givens, MD    Chief Complaint: Stomach pain, nausea   History of Present Illness:  Cynthia Price is a 65 y.o. 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 She had multiple bone films that were negative for occult fracture. Now with a primary c/o nonfocal abdominal pain. No emesis. + nausea. No BRBPR. No melena.   06/25/2012 - went to the hospital and had a lot of xrays at [facility], no broken bones. Bad bruises all over.  06/25/2012 - went to [facility] to check up with the neurologist.   Some abdominal pain, nonspecific, but now it is mostly the stomach.  Has been hurting a few days.   S/p cholecystectomy and appy  Patient Active Problem List  Diagnosis  . VITAMIN D DEFICIENCY  . HYPERCHOLESTEROLEMIA  . DEPRESSION, MILD  . Multiple sclerosis  . NEUROPATHY  . CYSTOCELE WITHOUT MENTION UTERINE PROLAPSE LAT  . ARTHRITIS  . FATIGUE  . UNS ADVRS EFF OTH RX MEDICINAL&BIOLOGICAL SBSTNC  . Thrombocythemia, essential  . Edema  . UTI (lower urinary tract infection)  . Tachycardia  . Rib fracture  . Anemia  . Diastolic dysfunction  . CAD (coronary artery disease)  . Urinary retention  . Risk for falls  . DVT (deep venous thrombosis)    Past Medical History  Diagnosis Date  . Essential thrombocytosis   . Back pain     s/p hemilaminectomy with h/o herniated disc at L4L5  . IBS (irritable bowel syndrome)   . Macular degeneration   . Depression     after husband died  . RLS (restless legs syndrome)   . Insomnia   . Spinal stenosis     lumbar   . MS (multiple sclerosis)   . Lupus anticoagulant positive   . Anemia 2012    from labs at [facility]  . Urinary retention     with high post void residuals 2013, per [facility] uro  . Hyperlipidemia   . Blood clot in vein     behind left knee    Past Surgical History  Procedure Date  . Appendectomy   . Cholecystectomy   . Lumbar laminectomy   . Cholecystectomy   . X-stop implantation     History  Substance Use Topics  . Smoking status: Never Smoker   . Smokeless tobacco: Never Used  . Alcohol Use: No    Family History  Problem Relation Age of Onset  . Heart disease Mother   . Thyroid cancer Mother   . Ovarian cancer Mother   . Alcohol abuse Father   . Heart disease Father     Allergies  Allergen Reactions  . Atorvastatin     Muscle aches at >40mg      Medication list has been reviewed and updated.  Current Outpatient Prescriptions on File Prior to Visit  Medication Sig Dispense Refill  . atorvastatin (LIPITOR) 40 MG tablet Take 40 mg by mouth daily.       . citalopram (CELEXA) 10 MG tablet Take 10 mg by mouth daily as needed.      Marland Kitchen  gabapentin (NEURONTIN) 600 MG tablet Take 2 tablets (1,200 mg total) by mouth 3 (three) times daily.  180 tablet  5  . hydroxyurea (HYDREA) 500 MG capsule Take one by mouth Monday, Tuesday, Wed, Thursday, Friday  90 capsule  1  . metoprolol tartrate (LOPRESSOR) 25 MG tablet Take 1 tablet (25 mg total) by mouth 2 (two) times daily.  180 tablet  3  . Oxcarbazepine (TRILEPTAL) 300 MG tablet Take 300 mg by mouth daily.       . pantoprazole (PROTONIX) 40 MG tablet Take 40 mg by mouth 2 (two) times daily.        . Rivaroxaban (XARELTO) 15 MG TABS tablet Take 1 tablet (15 mg total) by mouth 2 (two) times daily.  60 tablet  6  . rOPINIRole (REQUIP) 0.25 MG tablet Take 1 tablet (0.25 mg total) by mouth 4 (four) times daily.  120 tablet  5  . traMADol (ULTRAM) 50 MG tablet Take 50 mg by mouth every 6 (six) hours as needed.        Review of  Systems:  ROS: GEN: Acute illness details above, no fever GI: as above, decreased PO GU: maintaining adequate hydration and urination Pulm: No SOB Interactive and getting along well at home.  Otherwise, ROS is as per the HPI.   Physical Examination: Filed Vitals:   06/29/12 1420  BP: 102/60  Pulse: 86  Temp: 97.8 F (36.6 C)   Filed Vitals:   06/29/12 1420  Weight: 137 lb 4 oz (62.256 kg)   There is no height on file to calculate BMI. Ideal Body Weight:    GEN: mildly ill, NAD, Non-toxic, A & O x 3 HEENT: Atraumatic, Normocephalic. Neck supple. No masses, No LAD. Ears and Nose: No external deformity. CV: RRR, No M/G/R. No JVD. No thrill. No extra heart sounds. PULM: CTA B, no wheezes, crackles, rhonchi. No retractions. No resp. distress. No accessory muscle use. ABD: S, moderate tenderness, relatively focal but nonspecifically to the RUQ and LUQ. Minimal distension, no rebound. EXTR: No c/c/e NEURO moves very slowly PSYCH: flat affect   Assessment and Plan:  1. Abdominal  pain, other specified site  CT Abdomen Pelvis Wo Contrast, Hepatic function panel, Lipase, CBC with Differential   Stat Labs  Obtain a CT of the abdomen and pelvis without contrast stat to rule out potential liver, spleen, or renal laceration. Patient with a history of renal insufficiency. Abdominal findings above.  Orders Today:  Orders Placed This Encounter  Procedures  . CT Abdomen Pelvis Wo Contrast    Standing Status: Future     Number of Occurrences:      Standing Expiration Date: 09/29/2013    Order Specific Question:  Reason for exam:    Answer:  [facility], trauma 06/25/2012, pain, nausea, abdominal pain, concern for potential intraabdominal injury    Order Specific Question:  Preferred imaging location?    Answer:  External  . Hepatic function panel  . Lipase  . CBC with Differential    Medications Today: (Includes new updates added during medication reconciliation) No orders of the  defined types were placed in this encounter.     Hannah Beat, MD

## 2012-06-29 NOTE — Patient Instructions (Addendum)
REFERRAL: GO THE THE FRONT ROOM AT THE ENTRANCE OF OUR CLINIC, NEAR CHECK IN. ASK FOR MARION. SHE WILL HELP YOU SET UP YOUR REFERRAL. DATE: TIME:  

## 2012-07-02 ENCOUNTER — Telehealth: Payer: Self-pay

## 2012-07-02 ENCOUNTER — Encounter: Payer: Self-pay | Admitting: Family Medicine

## 2012-07-02 ENCOUNTER — Telehealth: Payer: Self-pay | Admitting: Family Medicine

## 2012-07-02 NOTE — Telephone Encounter (Signed)
Triage Record Num: [id number] Operator: Lodema Pilot Patient Name: Cynthia Price Call Date & Time: 06/29/2012 7:48:13PM Patient Phone: [phone] PCP: Hannah Beat Patient Gender: Female PCP Fax : [phone] Patient DOB: 12-Oct-1946 Practice Name: [facility] Reason for Call: Caller: Cynthia Price; PCP: Juleen China.; CB#: [phone]; Call regarding Cynthia Price is calling from [place] regarding a CBC ordered on Cynthia Price, Cynthia Price by Hannah Beat T..; CBC with diff was drawn 06/29/12 at 1501. Platelets 418, Baso % 2. Spoke with Dr. Patsy Lager and received orders to call pt and make aware that CT scan of abd and labs were normal. Will be sore for a couple of weeks due to fall. Spoke to Pt and informed of normal results. Pt satisfied. Protocol(s) Used: Office Note Recommended Outcome per Protocol: Information Noted and Sent to Office Reason for Outcome: Caller information to office Care Advice: ~

## 2012-07-02 NOTE — Telephone Encounter (Signed)
Noted  

## 2012-07-02 NOTE — Telephone Encounter (Signed)
Already aware

## 2012-07-02 NOTE — Telephone Encounter (Signed)
Dr Patsy Lager said he would take care of; sending note to Dr Patsy Lager.

## 2012-07-02 NOTE — Telephone Encounter (Signed)
Caller: Cynthia Price/Patient; PCP: Cynthia Price); CB#: [phone]; ; ; Call regarding Abdominal Pain;   Patient states she was seen in office 06/29/12 for c/o abdominal pain. States had CAT scan of abdomen with negative results. Patient states she continues to experience "center of stomach" above umbilicus. States pain is intermittent. States pain increased since 07/01/12 p.m. Patient describes pain as "cramping pain." Patient c/o nausea, no vomiting. States last bowel movement was 06/28/12. Denies pain or burning with urination. Patient states she fell on 06/25/12 and was evaluated in office on 06/25/12. States has had pain in abdomen since the fall. Denies any new back symptoms. Afebrile. States pain improves with standing upright. Patient confirms loss of appetite. Patient states abdomen feels bloated and "full". Triage per Abdominal Pain Protocol. Emergent triage symptom of Abdominal Pain that has been continuous for 3 hours or more and loss of appetite. No appointments available in Epic Electronic Health Record.  PLEASE RETURN CALL TO PATIENT REGARDING EMERGENCY DISPOSITION AND NO APPOINTMENTS AVAILABLE. PATIENT CAN BE REACHED @ [phone]. Message sent with High Priority to Triage [facility] in Epic Electronic Health Record. Care advice given per guidelines. Patient advised to rest. change positions slowly. Call back paramters reviewed. Patient verbalizes understanding.

## 2012-07-02 NOTE — Telephone Encounter (Signed)
D/w patient. She has been constipated since last week. I also called Dr. Mariah Milling, who assured me that the small pericardial effusion would not be causing abdominal pain.  Will plan to discuss anticoagulation with Dr. Para March on his return

## 2012-07-03 ENCOUNTER — Other Ambulatory Visit: Payer: Self-pay

## 2012-07-03 NOTE — Telephone Encounter (Signed)
Pt called cannot get Requip refilled until 07/05/12 due to insurance. Occasionally if hurting badly will take extra Requip. Pt said needs med today. [facility] at [facility] will calculate cost of paying out of pocket for 2 days of Requip and she will call pt back with price. Pt voiced understanding

## 2012-07-04 ENCOUNTER — Telehealth: Payer: Self-pay | Admitting: *Deleted

## 2012-07-04 NOTE — Telephone Encounter (Signed)
Dr. Para March is here tomorrow --- will get his input

## 2012-07-04 NOTE — Telephone Encounter (Signed)
Patient calling says she is on requip and can not sleep at night because her legs keep moving patient request I send this to dr. Patsy Lager she saw him when she fell a few days ago. She would like to go up on the requip if possible. Dr. Para March is out of the office  [facility]  [phone]

## 2012-07-04 NOTE — Telephone Encounter (Signed)
She can take two of the 0.25 tabs at night instead of one and see if that helps.  Please update med list.

## 2012-07-05 NOTE — Telephone Encounter (Signed)
Patient advised.

## 2012-07-08 ENCOUNTER — Telehealth: Payer: Self-pay | Admitting: Family Medicine

## 2012-07-08 MED ORDER — RIVAROXABAN 20 MG PO TABS: 20.0000 mg | ORAL_TABLET | Freq: Every day | ORAL | Status: DC

## 2012-07-08 NOTE — Telephone Encounter (Signed)
Please call pt.  Dr. Mariah Milling and I have been in discussion about her meds.  To minimize bleeding risk due to a possible fall, I would decrease the xarelto to 20mg  once a day.  She'll continue that for the next few months until the recheck venous u/s. If it has resolved by then she may only need TEDS at that time.  rx sent.  Thanks.

## 2012-07-09 NOTE — Telephone Encounter (Signed)
Left detailed message on VM.  Will call back later in the day to be sure that message was received.

## 2012-07-10 NOTE — Telephone Encounter (Signed)
LMOVM asking patient to return call to signify she has received and understood the earlier instructions.

## 2012-07-10 NOTE — Telephone Encounter (Signed)
Patient advised.

## 2012-07-18 ENCOUNTER — Telehealth: Payer: Self-pay

## 2012-07-18 NOTE — Telephone Encounter (Signed)
If she is improved on the higher dose, then it is okay to continue 0.5mg  po tid.  Please call in #90 of the 0.5mg  tabs with 5 rf.  Please update med list.

## 2012-07-18 NOTE — Telephone Encounter (Signed)
Pt states requip 0.25 mg pt taking 2 tablets by mouth three times a day with Gabapentin 600 mg  Taking 2 tabs by mouth three times a day. [facility] said pt cannot get refills on requip because too soon to refill. Pt directions for Requip are 1 tab 4 times daily [facility].Please advise.

## 2012-07-19 ENCOUNTER — Other Ambulatory Visit: Payer: Self-pay | Admitting: *Deleted

## 2012-07-19 ENCOUNTER — Telehealth: Payer: Self-pay

## 2012-07-19 MED ORDER — ROPINIROLE HCL 0.5 MG PO TABS: 0.5000 mg | ORAL_TABLET | Freq: Three times a day (TID) | ORAL | Status: DC

## 2012-07-19 NOTE — Telephone Encounter (Signed)
Pt left v/m requesting refill on Gabapentin. Spoke with pharmacist at [facility] pt has refills on Gabapentin available. Left v/m for pt to call back.

## 2012-07-19 NOTE — Telephone Encounter (Signed)
Rx sent to pharmacy electronically.   

## 2012-07-20 DIAGNOSIS — N368 Other specified disorders of urethra: Secondary | ICD-10-CM | POA: Insufficient documentation

## 2012-07-20 DIAGNOSIS — K5900 Constipation, unspecified: Secondary | ICD-10-CM | POA: Insufficient documentation

## 2012-07-20 DIAGNOSIS — N952 Postmenopausal atrophic vaginitis: Secondary | ICD-10-CM | POA: Insufficient documentation

## 2012-07-20 DIAGNOSIS — N319 Neuromuscular dysfunction of bladder, unspecified: Secondary | ICD-10-CM | POA: Insufficient documentation

## 2012-07-20 DIAGNOSIS — N390 Urinary tract infection, site not specified: Secondary | ICD-10-CM | POA: Insufficient documentation

## 2012-07-24 NOTE — Telephone Encounter (Signed)
Pt has already gotten refills.

## 2012-08-06 ENCOUNTER — Other Ambulatory Visit: Payer: Self-pay

## 2012-08-06 MED ORDER — ROPINIROLE HCL 0.5 MG PO TABS: ORAL_TABLET | ORAL | Status: DC

## 2012-08-06 NOTE — Telephone Encounter (Signed)
Sent!

## 2012-08-06 NOTE — Telephone Encounter (Signed)
Pt takes one tab four times a day and occasionally takes 2 tabs at one time when left leg hurts and jumps. Pt request new rx sent in with increased frequency or can the mg be increased. [facility].Please advise.

## 2012-08-09 ENCOUNTER — Encounter (INDEPENDENT_AMBULATORY_CARE_PROVIDER_SITE_OTHER): Payer: Medicare Other

## 2012-08-09 DIAGNOSIS — I82409 Acute embolism and thrombosis of unspecified deep veins of unspecified lower extremity: Secondary | ICD-10-CM

## 2012-08-09 DIAGNOSIS — M7989 Other specified soft tissue disorders: Secondary | ICD-10-CM

## 2012-08-09 DIAGNOSIS — M79609 Pain in unspecified limb: Secondary | ICD-10-CM

## 2012-08-16 ENCOUNTER — Telehealth: Payer: Self-pay | Admitting: Cardiovascular Disease

## 2012-08-16 NOTE — Telephone Encounter (Signed)
See below and advise thanks 

## 2012-08-16 NOTE — Telephone Encounter (Signed)
Pt says she was told to decrease Xarelto to 15 mg tablets at last visit several weeks ago. She was taking 20 mg tablets prior to swtiching and has a new bottle of 20 mg tablets. She wonders if ok to take the 20 mg tablets qd as she was taking the 15 mg tablets. I advised ok to take 20 mg PO QD today only, that Dr. Jearl Klinefelter. Para March may want to change altogether since LE U/S came back negative for thrombus. (9/16). I will discuss with Dr. Mariah Milling to see if he wants pt to remain on Xarelto 15 mg daily or stop. She also mentions that same LE is still edematous; hard to get TED hose on  I will address this with Dr. Mariah Milling as well. Understanding verb.

## 2012-08-16 NOTE — Telephone Encounter (Signed)
Pt was taking 15 mg of xarelto and then was increased to 20 mg. Pt isout of the 20mg  and has a whole bottle of the 15 and wants to know if she can just take the 15 instead of refilling the 20.  Also pt calling for test results from 9/12

## 2012-08-24 NOTE — Telephone Encounter (Signed)
Pt calling states that she still has swollen foot and leg. States that if is very hard to walk

## 2012-08-24 NOTE — Telephone Encounter (Signed)
Per last LE u/s,popliteal DVT has resolved Still having LE edema What do you suggest?

## 2012-08-27 NOTE — Telephone Encounter (Signed)
She can probably stop xarelto Needs to wear TED hose She could try lasix 20 mg prn with kcl 20 meq for swelling  If she continues on lasix frequently, needs lab work, bmp

## 2012-08-28 ENCOUNTER — Other Ambulatory Visit: Payer: Self-pay

## 2012-08-28 MED ORDER — FUROSEMIDE 20 MG PO TABS: 20.0000 mg | ORAL_TABLET | ORAL | Status: DC | PRN

## 2012-08-28 MED ORDER — POTASSIUM CHLORIDE CRYS ER 20 MEQ PO TBCR: EXTENDED_RELEASE_TABLET | ORAL | Status: DC

## 2012-08-28 NOTE — Telephone Encounter (Signed)
LMTCB

## 2012-08-28 NOTE — Telephone Encounter (Signed)
Pt informed. Understanding verb Will send in new RX to pharmacy She will try taking KCL and lasix daily x 2-3 days. I will call her Friday to check on her symptoms.  She mentions she fell when getting up to go to bathroom last week. She says it was dark and she did not turn on light. She also did not use walker for assist as she usually does.  She did not go to ER/MD office to get evaluated.  She is still sore and thinks she bruised a rib.  She declines need to go to MD to be evaluated.  I will call to assess edema and other symptoms Friday. Understanding verb.

## 2012-08-30 ENCOUNTER — Telehealth: Payer: Self-pay | Admitting: Family Medicine

## 2012-08-30 NOTE — Telephone Encounter (Signed)
Caller: [place]ma[place]/Patient; Patient Name: Doretha Imus; PCP: Crawford Givens Clelia Croft) [facility]); [facility] Phone Number: [phone] Calling back to see if she can come into office for appointment on 08/31/12. She called earlier about sx of back pain and some pain in her L side after falling on 08/26/12. ED advised.  L leg is swollen from calf to foot. Cardiologist had US done of L leg a few weeks ago and blood clot resolved. Started on fluid pill on 08/29/12.   Using walking and she can walk fine- pain from back and L side radiates down into L leg. Pain level # 5 on 1-10 scale.  Triage and Care advice per Back Symptoms Protocol and appointment advised within 24 hours for "pain radiates into thigh or below knee". Appointment scheduled with Dr. Para March at 1100am on  08/31/12.

## 2012-08-30 NOTE — Telephone Encounter (Signed)
Caller: [name]/Patient; Patient Name: Cynthia Price; PCP: Crawford Givens Clelia Croft) [facility]); [facility] Phone Number: [phone] Larey Seat on 08/26/12 and last night she started with pain in L side and lower back and hurts to take deep breath-08/29/12. L arm has 1 1/2 inch bruise, and fingers are swollen. Hx of falls ever since she had back surgery in 2012. Triage and care advice per [place] Pain Protocol and ED Advised for "other associated injuries more than 24 hours ago". She is going to have her son take her this afternoon to hospital in [place]. Advised to f/u with the office as needed.

## 2012-08-30 NOTE — Telephone Encounter (Signed)
Will see tomorrow

## 2012-08-31 ENCOUNTER — Encounter: Payer: Self-pay | Admitting: Family Medicine

## 2012-08-31 ENCOUNTER — Ambulatory Visit: Payer: Medicare Other | Admitting: Family Medicine

## 2012-08-31 ENCOUNTER — Telehealth: Payer: Self-pay

## 2012-08-31 ENCOUNTER — Ambulatory Visit (INDEPENDENT_AMBULATORY_CARE_PROVIDER_SITE_OTHER): Payer: Medicare Other | Admitting: Family Medicine

## 2012-08-31 VITALS — BP 112/68 | HR 64 | Temp 97.7°F | Wt 132.0 lb

## 2012-08-31 DIAGNOSIS — Z9181 History of falling: Secondary | ICD-10-CM

## 2012-08-31 DIAGNOSIS — Z23 Encounter for immunization: Secondary | ICD-10-CM

## 2012-08-31 DIAGNOSIS — R609 Edema, unspecified: Secondary | ICD-10-CM

## 2012-08-31 DIAGNOSIS — G2581 Restless legs syndrome: Secondary | ICD-10-CM

## 2012-08-31 MED ORDER — TRAMADOL HCL 50 MG PO TABS: 50.0000 mg | ORAL_TABLET | Freq: Four times a day (QID) | ORAL | Status: DC | PRN

## 2012-08-31 MED ORDER — ROPINIROLE HCL 1 MG PO TABS: 1.0000 mg | ORAL_TABLET | Freq: Four times a day (QID) | ORAL | Status: DC | PRN

## 2012-08-31 NOTE — Assessment & Plan Note (Signed)
With recent fall.  Continue PT, use night light.  Chest wall pain will likely resolve.  No need to image today as it wouldn't change mgmt.  D/w pt.  She agrees.  Use tramadol with sedation caution.  Wrist bruising should resolve, no deficit seen.

## 2012-08-31 NOTE — Assessment & Plan Note (Signed)
Inc requip up to 4mg  a day and continue gabapentin.  She agrees.

## 2012-08-31 NOTE — Assessment & Plan Note (Signed)
Continue prn lasix and I'll notify cardiology clinic.

## 2012-08-31 NOTE — Progress Notes (Signed)
She had a fall recently, fell on her side.  Was going to the BR early AM, fell and hit night stand.  No LOC. She was able to get up on her own. Was able to get to the BR. No other falls since then. She isn't sure what caused the fall, possibly low lighting.  She didn't have a light on then, but she has a nightlight now and this help some.  She is able to get a deep breath, but some pain with occ deep breath.  She can move her L wrist but her fingers are slightly puffy.   We talked about fall prevention. She is in PT for balance already. Now sore on the L side of chest/back and less so on L wrist.    On TMP uti suppression per uro at [facility] with less frequent dysuria.    Recently with BLE edema treated with lasix/potassium.  Improved transiently with med use.  Inc in UOP on days with lasix use.  Asking about options at this point.   RLS sx improved with requip, but not controlled unless taking mult doses, 1mg  at a time, during the day and before bed.  No ADE from the medicine and some relief with that dose.  Needs refill.  Due for flu shot.   Meds, vitals, and allergies reviewed.   ROS: See HPI.  Otherwise, noncontributory.  Prev [facility] urology note reviewed.   nad ncat rrr ctab abd soft, not tt Chest wall w/o bruising but L sided is ttp- mildly- near the bra line in midaxillary line L>R leg edema noted L wrist with superficial bruise but not ttp on snuffbox or other bony prominences.  No dec in ROM at the wrist.

## 2012-08-31 NOTE — Patient Instructions (Addendum)
Take the tramadol as needed (it can make you drowsy) and take 1mg  of requip up to 4 times a day.  Take care.  I'll be in touch with Dr. Mariah Milling about the lasix.  It's okay to continue as needed (with the potassium) for now.

## 2012-08-31 NOTE — Addendum Note (Signed)
Addended by: Annamarie Major on: 08/31/2012 02:49 PM   Modules accepted: Orders

## 2012-08-31 NOTE — Telephone Encounter (Signed)
I spoke with pt who says edema is still present despite taking lasix qd x 2-3 days. She has appt with Dr. Para March this am at 1130 to discuss this as well as pain in ribs since fall last week.  She will go back on PRN lasix and let us know if there is anything different we need to do after appt with Dr. Para March.

## 2012-10-24 ENCOUNTER — Telehealth: Payer: Self-pay | Admitting: Family Medicine

## 2012-10-24 MED ORDER — VALACYCLOVIR HCL 1 G PO TABS: 1000.0000 mg | ORAL_TABLET | Freq: Three times a day (TID) | ORAL | Status: DC

## 2012-10-24 NOTE — Telephone Encounter (Signed)
Patient Information:  Caller Name: Savaya  Phone: [phone]  Patient: Cynthia Price, Cynthia Price  Gender: Female  DOB: 11/09/1946  Age: 66 Years  PCP: Crawford Givens Clelia Croft) [facility])   Symptoms  Reason For Call & Symptoms: recurrence of shingles  Reviewed Health History In EMR: Yes  Reviewed Medications In EMR: Yes  Reviewed Allergies In EMR: Yes  Reviewed Surgeries / Procedures: Yes  Date of Onset of Symptoms: 10/17/2012  Guideline(s) Used:  Rash or Redness - Localized  Disposition Per Guideline:   See Within 3 Days in Office  Reason For Disposition Reached:   Localized rash present > 7 days  Advice Given:  N/A  Office Follow Up:  Does the office need to follow up with this patient?: No  Instructions For The Office: N/A  Appointment Scheduled:  10/26/2012 10:15:00  RN Note:  Single blister spot appeared 1 week ago, which is very tender to touch, and mildly itchy.  Area has not spread; afebrile.  Per protocol, emergent symtpoms denied; advised appt within 72 hours.  Appt scheduled 10/26/12 1015 with Dr. Para March.

## 2012-10-24 NOTE — Telephone Encounter (Signed)
Patient notified as instructed by telephone. 

## 2012-10-24 NOTE — Telephone Encounter (Signed)
I would go ahead and start the valtex.  rx sent.  Please notify pt.  Will see pt Friday.

## 2012-10-25 ENCOUNTER — Telehealth: Payer: Self-pay | Admitting: Family Medicine

## 2012-10-25 NOTE — Telephone Encounter (Signed)
Called pt.  I am currently sick and will need to cancel clinic tomorrow.  I called pt to reschedule or have her get seen at [facility] if needed.  Pt agreed.

## 2012-10-26 ENCOUNTER — Encounter: Payer: Self-pay | Admitting: Family Medicine

## 2012-10-26 ENCOUNTER — Ambulatory Visit (INDEPENDENT_AMBULATORY_CARE_PROVIDER_SITE_OTHER): Payer: Medicare Other | Admitting: Family Medicine

## 2012-10-26 ENCOUNTER — Ambulatory Visit: Payer: Self-pay | Admitting: Family Medicine

## 2012-10-26 VITALS — BP 108/62 | HR 62 | Temp 97.9°F | Wt 128.8 lb

## 2012-10-26 DIAGNOSIS — B029 Zoster without complications: Secondary | ICD-10-CM

## 2012-10-26 DIAGNOSIS — L239 Allergic contact dermatitis, unspecified cause: Secondary | ICD-10-CM

## 2012-10-26 DIAGNOSIS — L259 Unspecified contact dermatitis, unspecified cause: Secondary | ICD-10-CM

## 2012-10-26 MED ORDER — FLUOCINONIDE 0.05 % EX OINT: TOPICAL_OINTMENT | Freq: Two times a day (BID) | CUTANEOUS | Status: DC

## 2012-10-26 MED ORDER — TRAMADOL HCL 50 MG PO TABS: 50.0000 mg | ORAL_TABLET | Freq: Four times a day (QID) | ORAL | Status: DC | PRN

## 2012-10-26 NOTE — Progress Notes (Signed)
Nature conservation officer at [facility] [address] [place] [address] Phone: [phone] Fax: [phone]  Date:  10/26/2012   Name:  Cynthia Price   DOB:  [birth year]   MRN:  [id number] Gender: female Age: 67 y.o.  PCP:  Crawford Givens, MD  Evaluating MD: Hannah Beat, MD   Chief Complaint: Herpes Zoster   History of Present Illness:  Cynthia Price is a 66 y.o. pleasant patient who presents with the following:  One rash bump on intter L arm, blister, if the clothes will touch it hurts a lot. Will sting, burn and itch. First showed up last week. This is the same distribution on her LEFT arm from her prior severe shingles case where she has been scarring as well. She actually was called in some Valtrex yesterday. She has had symptoms for about 7-10 days.  She also has some flat macular scaly rash on the LEFT anterior ankle. This has been present for many months, and she is tried some Neosporin, hydrocortisone, and other topicals without any significant relief.  Patient Active Problem List  Diagnosis  . VITAMIN D DEFICIENCY  . HYPERCHOLESTEROLEMIA  . DEPRESSION, MILD  . Multiple sclerosis  . NEUROPATHY  . CYSTOCELE WITHOUT MENTION UTERINE PROLAPSE LAT  . ARTHRITIS  . FATIGUE  . UNS ADVRS EFF OTH RX MEDICINAL&BIOLOGICAL SBSTNC  . Thrombocythemia, essential  . Edema  . UTI (lower urinary tract infection)  . Tachycardia  . Anemia  . Diastolic dysfunction  . CAD (coronary artery disease)  . Urinary retention  . Risk for falls  . DVT (deep venous thrombosis)  . RLS (restless legs syndrome)    Past Medical History  Diagnosis Date  . Essential thrombocytosis   . Back pain     s/p hemilaminectomy with h/o herniated disc at L4L5  . IBS (irritable bowel syndrome)   . Macular degeneration   . Depression     after husband died  . RLS (restless legs syndrome)   . Insomnia   . Spinal stenosis     lumbar  . MS (multiple sclerosis)   . Lupus anticoagulant positive    . Anemia 2012    from labs at [facility]  . Urinary retention     with high post void residuals 2013, per [facility]  . Hyperlipidemia   . Blood clot in vein     behind left knee    Past Surgical History  Procedure Date  . Appendectomy   . Cholecystectomy   . Lumbar laminectomy   . Cholecystectomy   . X-stop implantation     History  Substance Use Topics  . Smoking status: Never Smoker   . Smokeless tobacco: Never Used  . Alcohol Use: No    Family History  Problem Relation Age of Onset  . Heart disease Mother   . Thyroid cancer Mother   . Ovarian cancer Mother   . Alcohol abuse Father   . Heart disease Father     Allergies  Allergen Reactions  . Atorvastatin     Muscle aches at >40mg      Medication list has been reviewed and updated.  Outpatient Prescriptions Prior to Visit  Medication Sig Dispense Refill  . citalopram (CELEXA) 10 MG tablet Take 10 mg by mouth daily as needed.      . gabapentin (NEURONTIN) 600 MG tablet Take 2 tablets (1,200 mg total) by mouth 3 (three) times daily.  180 tablet  5  . hydroxyurea (HYDREA) 500 MG capsule  Take one by mouth Monday, Tuesday, Wed, Thursday, Friday  90 capsule  1  . metoprolol tartrate (LOPRESSOR) 25 MG tablet Take 1 tablet (25 mg total) by mouth 2 (two) times daily.  180 tablet  3  . Oxcarbazepine (TRILEPTAL) 300 MG tablet Take 300 mg by mouth daily.       . pantoprazole (PROTONIX) 40 MG tablet Take 40 mg by mouth 2 (two) times daily.        . Rivaroxaban (XARELTO) 20 MG TABS Take 1 tablet (20 mg total) by mouth daily.  30 tablet  2  . rOPINIRole (REQUIP) 1 MG tablet Take 1 tablet (1 mg total) by mouth 4 (four) times daily as needed.  120 tablet  12  . traMADol (ULTRAM) 50 MG tablet Take 1 tablet (50 mg total) by mouth every 6 (six) hours as needed.  40 tablet  12  . trimethoprim (TRIMPEX) 100 MG tablet Take 100 mg by mouth daily.       . [DISCONTINUED] furosemide (LASIX) 20 MG tablet Take 1 tablet (20 mg total) by mouth as  needed.  90 tablet  3  . [DISCONTINUED] valACYclovir (VALTREX) 1000 MG tablet Take 1 tablet (1,000 mg total) by mouth 3 (three) times daily.  21 tablet  0  . [DISCONTINUED] atorvastatin (LIPITOR) 40 MG tablet Take 40 mg by mouth daily.       . [DISCONTINUED] potassium chloride SA (K-DUR,KLOR-CON) 20 MEQ tablet Take 1 tablet daily as needed when you take lasix  90 tablet  3   Last reviewed on 10/26/2012 10:36 AM by Shon Millet, CMA  Review of Systems:  No fevers, chills, sweats, or other systemic symptoms. No chest pain.  Physical Examination: Filed Vitals:   10/26/12 1028  BP: 108/62  Pulse: 62  Temp: 97.9 F (36.6 C)  TempSrc: Oral  Weight: 128 lb 12 oz (58.401 kg)    There is no height on file to calculate BMI. Ideal Body Weight:     GEN: A and O x 3. WDWN. NAD.    ENT: Nose clear, ext NML.  No LAD.  No JVD.  TM's clear. Oropharynx clear.  PULM: Normal WOB, no distress. No crackles, wheezes, rhonchi. CV: RRR, no M/G/R, No rubs, No JVD.   EXT: warm and well-perfused, tr edema b PSYCH: Pleasant and conversant. SKIN: very small vesicle on LEFT antecubital fossa x1. On the LEFT medial ankle there is a small area about the size of a half-dollar this flat and slightly scaly.  Assessment and Plan:  1. Zoster   2. Allergic dermatitis    Very tender, paresthesias, burning in same distribution of prior zoster. Classic appearance. Valtrex is likely not going to be effective given the timing. Counseled the patient.  Separate rash. Appears to be more dermatitis in nature as opposed to fungal. Trial of Lidex ointment.  Orders Today:  No orders of the defined types were placed in this encounter.    Updated Medication List: (Includes new medications, updates to list, dose adjustments) Meds ordered this encounter  Medications  . ESTRACE VAGINAL 0.1 MG/GM vaginal cream    Sig: daily.  . AMPYRA 10 MG TB12    Sig: daily.  . fluocinonide ointment (LIDEX) 0.05 %    Sig:  Apply topically 2 (two) times daily.    Dispense:  60 g    Refill:  1  . traMADol (ULTRAM) 50 MG tablet    Sig: Take 1 tablet (50 mg total) by mouth every  6 (six) hours as needed.    Dispense:  40 tablet    Refill:  0    Medications Discontinued: Medications Discontinued During This Encounter  Medication Reason  . atorvastatin (LIPITOR) 40 MG tablet Allergic reaction  . valACYclovir (VALTREX) 1000 MG tablet Discontinued by provider  . furosemide (LASIX) 20 MG tablet Discontinued by provider  . potassium chloride SA (K-DUR,KLOR-CON) 20 MEQ tablet Patient has not taken in last 30 days  . traMADol (ULTRAM) 50 MG tablet Reorder     Hannah Beat, MD

## 2013-01-16 ENCOUNTER — Other Ambulatory Visit: Payer: Self-pay | Admitting: *Deleted

## 2013-01-16 MED ORDER — GABAPENTIN 600 MG PO TABS: 1200.0000 mg | ORAL_TABLET | Freq: Three times a day (TID) | ORAL | Status: DC

## 2013-01-16 MED ORDER — CITALOPRAM HYDROBROMIDE 10 MG PO TABS: 10.0000 mg | ORAL_TABLET | Freq: Every day | ORAL | Status: DC | PRN

## 2013-01-16 NOTE — Telephone Encounter (Signed)
Sent!

## 2013-01-16 NOTE — Telephone Encounter (Signed)
Ok to refill 

## 2013-03-26 ENCOUNTER — Ambulatory Visit (INDEPENDENT_AMBULATORY_CARE_PROVIDER_SITE_OTHER): Payer: Medicare Other | Admitting: Cardiovascular Disease

## 2013-03-26 ENCOUNTER — Encounter: Payer: Self-pay | Admitting: Cardiovascular Disease

## 2013-03-26 VITALS — BP 92/70 | HR 53 | Ht 66.0 in | Wt 126.0 lb

## 2013-03-26 DIAGNOSIS — I498 Other specified cardiac arrhythmias: Secondary | ICD-10-CM

## 2013-03-26 DIAGNOSIS — R001 Bradycardia, unspecified: Secondary | ICD-10-CM

## 2013-03-26 DIAGNOSIS — Z9181 History of falling: Secondary | ICD-10-CM

## 2013-03-26 DIAGNOSIS — R609 Edema, unspecified: Secondary | ICD-10-CM

## 2013-03-26 DIAGNOSIS — I959 Hypotension, unspecified: Secondary | ICD-10-CM

## 2013-03-26 DIAGNOSIS — I2510 Atherosclerotic heart disease of native coronary artery without angina pectoris: Secondary | ICD-10-CM

## 2013-03-26 HISTORY — DX: Hypotension, unspecified: I95.9

## 2013-03-26 NOTE — Assessment & Plan Note (Signed)
Recent fall with injury to her right side. Symptoms slowly getting better. She is taking tramadol.

## 2013-03-26 NOTE — Progress Notes (Signed)
Patient ID: Cynthia Price, female    DOB: [birth year], 67 y.o.   MRN: [id number]  HPI Comments: Ms. Lacks is a 67 year old woman with multiple sclerosis, depression, thrombocytosis, restless leg syndrome, scoliosis, kyphosis, history of multiple falls with previous fall leading to left rib fracture on September 24 2012 presenting to [facility] after she started vomiting. In the emergency room, she was noted to be tachycardic with heart rate of 150 beats per minute, mild troponin elevation. Heart rate came down with pain medication and started on metoprolol. No prior stress test as she was unable to lie flat.  She reports she developed left lower extremity swelling, went to the emergency room 05/03/2012 with diagnosis of nonocclusive left lower extremity popliteal thrombus. She was started on xarelto  twice a day. Followup ultrasound several months later showed resolution of the thrombus.  She presents today and reports that she had a recent fall. She hit her right arm, right side. She does have some discomfort with deep inspiration. Blood pressure and heart rate are running low. She denies any tachycardia. Weight is down 7 pounds from her prior clinic visit. Previously was on Lipitor with dramatic drop in her cholesterol 65 points down to 190. She stopped Lipitor for uncertain reasons. She's tried Lasix for left lower extremity swelling with no improvement. Her compression hose is too tight and makes her MS worse on the left leg.  Echocardiogram in the hospital September 29, 2011 shows normal LV function, right ventricular systolic pressure estimated at 30-40 mm mercury, mild to moderate TR, diastolic dysfunction  Ultrasound from the hospital shows nonocclusive DVT in the left popliteal vein  EKG today shows normal sinus rhythm with rate 53 beats per minute with no significant ST or T wave changes   Outpatient Encounter Prescriptions as of 03/26/2013  Medication Sig Dispense Refill  . AMPYRA 10 MG TB12  daily.      . citalopram (CELEXA) 10 MG tablet Take 1 tablet (10 mg total) by mouth daily as needed.  90 tablet  0  . ESTRACE VAGINAL 0.1 MG/GM vaginal cream daily.      . fluocinonide ointment (LIDEX) 0.05 % Apply topically 2 (two) times daily.  60 g  1  . gabapentin (NEURONTIN) 600 MG tablet Take 2 tablets (1,200 mg total) by mouth 3 (three) times daily.  180 tablet  5  . hydroxyurea (HYDREA) 500 MG capsule Take one by mouth Monday, Tuesday, Wed, Thursday, Friday  90 capsule  1  . metoprolol tartrate (LOPRESSOR) 25 MG tablet Take 1 tablet (25 mg total) by mouth 2 (two) times daily.  180 tablet  3  . Oxcarbazepine (TRILEPTAL) 300 MG tablet Take 300 mg by mouth daily.       Marland Kitchen rOPINIRole (REQUIP) 1 MG tablet Take 1 tablet (1 mg total) by mouth 4 (four) times daily as needed.  120 tablet  12  . traMADol (ULTRAM) 50 MG tablet Take 1 tablet (50 mg total) by mouth every 6 (six) hours as needed.  40 tablet  0  . trimethoprim (TRIMPEX) 100 MG tablet Take 100 mg by mouth daily.        Review of Systems  HENT: Negative.   Eyes: Negative.   Respiratory: Negative.   Cardiovascular: Positive for leg swelling.  Gastrointestinal: Negative.   Musculoskeletal: Positive for back pain, arthralgias and gait problem.  Skin: Negative.   Neurological: Positive for weakness.  Psychiatric/Behavioral: Negative.   All other systems reviewed and are negative.  BP 92/70  Pulse 53  Ht 5\' 6"  (1.676 m)  Wt 126 lb (57.153 kg)  BMI 20.35 kg/m2  Physical Exam  Nursing note and vitals reviewed. Constitutional: She is oriented to person, place, and time. She appears well-developed and well-nourished.  HENT:  Head: Normocephalic.  Nose: Nose normal.  Mouth/Throat: Oropharynx is clear and moist.  Eyes: Conjunctivae are normal. Pupils are equal, round, and reactive to light.  Neck: Normal range of motion. Neck supple. No JVD present.  Cardiovascular: Normal rate, regular rhythm, S1 normal, S2 normal, normal  heart sounds and intact distal pulses.  Exam reveals no gallop and no friction rub.   No murmur heard. Pulmonary/Chest: Effort normal and breath sounds normal. No respiratory distress. She has no wheezes. She has no rales. She exhibits no tenderness.  Abdominal: Soft. Bowel sounds are normal. She exhibits no distension. There is no tenderness.  Musculoskeletal: Normal range of motion. She exhibits edema. She exhibits no tenderness.  Swelling of left lower extremity around the foot  Lymphadenopathy:    She has no cervical adenopathy.  Neurological: She is alert and oriented to person, place, and time. Coordination normal.  Skin: Skin is warm and dry. No rash noted. No erythema.  Psychiatric: She has a normal mood and affect. Her behavior is normal. Judgment and thought content normal.    Assessment and Plan

## 2013-03-26 NOTE — Assessment & Plan Note (Signed)
Left lower extremity edema from complication of her DVT. Diuretic did not help. She is unable to tolerate TED hose. She could try leg elevation and Ace wrap

## 2013-03-26 NOTE — Patient Instructions (Addendum)
You are doing well. Please hold the metoprolol and take only as needed for fast heart rate  Please call us if you have new issues that need to be addressed before your next appt.  Your physician wants you to follow-up in: 12 months.  You will receive a reminder letter in the mail two months in advance. If you don't receive a letter, please call our office to schedule the follow-up appointment.

## 2013-03-26 NOTE — Assessment & Plan Note (Signed)
Low blood pressure, likely multifactorial including a drop in her weight, metoprolol. Suspect she normally runs low. We'll hold metoprolol. Encouraged by mouth fluids, salt. Encouraged her not to lose anymore weight.

## 2013-03-26 NOTE — Assessment & Plan Note (Signed)
Heart rate is low on today's visit, blood pressure is also low. We have suggested she hold her metoprolol. She could take this as needed for any periods of tachycardia.

## 2013-05-02 ENCOUNTER — Ambulatory Visit: Payer: Medicare Other | Admitting: Family Medicine

## 2013-05-09 ENCOUNTER — Encounter: Payer: Self-pay | Admitting: Family Medicine

## 2013-05-09 ENCOUNTER — Telehealth: Payer: Self-pay | Admitting: *Deleted

## 2013-05-09 ENCOUNTER — Ambulatory Visit (INDEPENDENT_AMBULATORY_CARE_PROVIDER_SITE_OTHER): Payer: Medicare Other | Admitting: Family Medicine

## 2013-05-09 VITALS — BP 104/60 | HR 63 | Temp 97.7°F | Wt 127.2 lb

## 2013-05-09 DIAGNOSIS — D75839 Thrombocytosis, unspecified: Secondary | ICD-10-CM

## 2013-05-09 DIAGNOSIS — G2581 Restless legs syndrome: Secondary | ICD-10-CM

## 2013-05-09 DIAGNOSIS — D473 Essential (hemorrhagic) thrombocythemia: Secondary | ICD-10-CM

## 2013-05-09 DIAGNOSIS — R339 Retention of urine, unspecified: Secondary | ICD-10-CM

## 2013-05-09 LAB — CBC WITH DIFFERENTIAL/PLATELET
Basophils Relative: 0.8 % (ref 0.0–3.0)
Hemoglobin: 12.6 g/dL (ref 12.0–15.0)
Lymphocytes Relative: 24.4 % (ref 12.0–46.0)
Monocytes Relative: 13.1 % — ABNORMAL HIGH (ref 3.0–12.0)
Neutro Abs: 3.3 10*3/uL (ref 1.4–7.7)
RBC: 3.92 Mil/uL (ref 3.87–5.11)

## 2013-05-09 LAB — COMPREHENSIVE METABOLIC PANEL
AST: 30 U/L (ref 0–37)
BUN: 15 mg/dL (ref 6–23)
CO2: 28 mEq/L (ref 19–32)
Calcium: 9.4 mg/dL (ref 8.4–10.5)
Chloride: 108 mEq/L (ref 96–112)
Creatinine, Ser: 1 mg/dL (ref 0.4–1.2)
GFR: 60.07 mL/min (ref >60.00)

## 2013-05-09 NOTE — Progress Notes (Signed)
MS- Restarted on ampyra about 2 months ago, through the clinic at [facility].  She is off SSRI and her BP meds (BP was low at outside clinic).  She is still anxious and this affects her walking.  She gets nervous with crowds. If relaxed, her gait is improved.   RSL and insomnia.  She wakes up at ~6am with RLS sx. She can have daytime sx, around noon if she doesn't repeat a dose.  She'll have sx again at night when she is trying to sleep.  Decreasing the gabapentin made the RLS worse. She takes the tramadol at night usually, occ during the day.    Some nausea for about 3 days, no vomiting/abd pain/blood in stool.  No fevers.   H/o thrombocytosis and due for labs.  Continues with hydroxyurea w/o known ADE.   Meds, vitals, and allergies reviewed.   ROS: See HPI.  Otherwise, noncontributory.  nad ncat Mmm rrr ctab abd soft, not ttp Gait and posture at basline.  She doesn't pick the L foot up fully.  Use a cane

## 2013-05-09 NOTE — Patient Instructions (Addendum)
Don't change your meds for now.  Go to the lab on the way out.  We'll contact you with your lab report. Take care.  

## 2013-05-09 NOTE — Telephone Encounter (Signed)
Patient says she forgot to ask you for something for nausea.  She has tried Dramamine but it doesn't work too well.  Please advise.   Uses [facility].

## 2013-05-10 MED ORDER — ROPINIROLE HCL 1 MG PO TABS: 1.0000 mg | ORAL_TABLET | Freq: Four times a day (QID) | ORAL | Status: DC | PRN

## 2013-05-10 MED ORDER — HYDROXYUREA 500 MG PO CAPS: ORAL_CAPSULE | ORAL | Status: DC

## 2013-05-10 MED ORDER — TRAMADOL HCL 50 MG PO TABS: 50.0000 mg | ORAL_TABLET | Freq: Four times a day (QID) | ORAL | Status: DC | PRN

## 2013-05-10 MED ORDER — GABAPENTIN 600 MG PO TABS: 1200.0000 mg | ORAL_TABLET | Freq: Three times a day (TID) | ORAL | Status: DC

## 2013-05-10 MED ORDER — TRIMETHOPRIM 100 MG PO TABS: 100.0000 mg | ORAL_TABLET | Freq: Every day | ORAL | Status: DC

## 2013-05-10 NOTE — Assessment & Plan Note (Signed)
She reports less dysuria with current meds.  Continue as is.

## 2013-05-10 NOTE — Assessment & Plan Note (Signed)
Controlled, continue current meds.  See notes on labs.   

## 2013-05-10 NOTE — Telephone Encounter (Signed)
Let me see her labs first.

## 2013-05-10 NOTE — Assessment & Plan Note (Signed)
With insomnia and nausea. I wouldn't take more than 4mg  of requip in a day. Will see if taking that amount helps the nausea. Would take tramadol 1 tab qid prn restless legs, see if that helps. She'll pdate me in about 1 week.  We can consider other options for insomnia at that point.

## 2013-05-16 ENCOUNTER — Telehealth: Payer: Self-pay

## 2013-05-16 MED ORDER — ONDANSETRON HCL 4 MG PO TABS: 4.0000 mg | ORAL_TABLET | Freq: Three times a day (TID) | ORAL | Status: DC | PRN

## 2013-05-16 NOTE — Telephone Encounter (Signed)
Patient advised.

## 2013-05-16 NOTE — Telephone Encounter (Signed)
Pt said still feels nauseated (pt wonders if MS is causing nausea). Pt said Dramamine is not helping nausea. No abd pain, no vomiting, no diarrhea or fever. Pt has had some constipation but Miralax helped constipation. [facility].Please advise.

## 2013-05-16 NOTE — Telephone Encounter (Signed)
I would try zofran for now; rx sent.  Thanks.

## 2013-08-16 ENCOUNTER — Ambulatory Visit (INDEPENDENT_AMBULATORY_CARE_PROVIDER_SITE_OTHER): Payer: Medicare Other | Admitting: Family Medicine

## 2013-08-16 ENCOUNTER — Encounter: Payer: Self-pay | Admitting: Family Medicine

## 2013-08-16 VITALS — BP 108/64 | HR 71 | Temp 97.6°F | Wt 124.5 lb

## 2013-08-16 DIAGNOSIS — Z23 Encounter for immunization: Secondary | ICD-10-CM

## 2013-08-16 DIAGNOSIS — L02619 Cutaneous abscess of unspecified foot: Secondary | ICD-10-CM

## 2013-08-16 MED ORDER — CEPHALEXIN 500 MG PO CAPS: 500.0000 mg | ORAL_CAPSULE | Freq: Four times a day (QID) | ORAL | Status: DC

## 2013-08-16 NOTE — Patient Instructions (Addendum)
Elevate your leg and take keflex 500mg  4 times a day.  If this isn't dramatically improved by Monday, the call about seeing podiatry.  Take care.

## 2013-08-18 DIAGNOSIS — L03039 Cellulitis of unspecified toe: Secondary | ICD-10-CM | POA: Insufficient documentation

## 2013-08-18 NOTE — Progress Notes (Signed)
She has repeatedly hit her L>R great toes.  Chronic nail changes B.  Now with some redness distal to the nail on the L>R 1st toes.  No fevers.    Meds, vitals, and allergies reviewed.   ROS: See HPI.  Otherwise, noncontributory.  nad Chronic nail thickening B on th 1st toes with other nails intact.  Distally with 1st toe erythema B, L>R w/o fluctuant mass. Normal DP pulses

## 2013-08-18 NOTE — Assessment & Plan Note (Signed)
B, 1st, L>R.  Advised to elevate, start keflex.  If not improved/resolved, call podiatry about follow up.  She agrees.  Okay for outpatient f/u.  No need to remove nail or do I&D today.

## 2013-12-23 ENCOUNTER — Other Ambulatory Visit: Payer: Self-pay | Admitting: Family Medicine

## 2014-02-04 ENCOUNTER — Encounter: Payer: Self-pay | Admitting: Family Medicine

## 2014-02-04 ENCOUNTER — Telehealth: Payer: Self-pay | Admitting: Family Medicine

## 2014-02-04 ENCOUNTER — Ambulatory Visit (INDEPENDENT_AMBULATORY_CARE_PROVIDER_SITE_OTHER): Payer: Commercial Managed Care - HMO | Admitting: Family Medicine

## 2014-02-04 VITALS — BP 110/60 | HR 63 | Wt 129.8 lb

## 2014-02-04 DIAGNOSIS — R300 Dysuria: Secondary | ICD-10-CM

## 2014-02-04 DIAGNOSIS — N390 Urinary tract infection, site not specified: Secondary | ICD-10-CM

## 2014-02-04 LAB — POCT URINALYSIS DIPSTICK
Bilirubin, UA: NEGATIVE
Blood, UA: NEGATIVE
GLUCOSE UA: NEGATIVE
Ketones, UA: NEGATIVE
NITRITE UA: NEGATIVE
PROTEIN UA: NEGATIVE
UROBILINOGEN UA: NEGATIVE
pH, UA: 8.5

## 2014-02-04 MED ORDER — CIPROFLOXACIN HCL 250 MG PO TABS: 250.0000 mg | ORAL_TABLET | Freq: Two times a day (BID) | ORAL | Status: DC

## 2014-02-04 NOTE — Telephone Encounter (Signed)
Patient Information:  Caller Name: Rosie  Phone: [phone]  Patient: Cynthia Price, Cynthia Price  Gender: Female  DOB: 30-Aug-1946  Age: 68 Years  PCP: Elsie Stain Brigitte Pulse) [facility])  Office Follow Up:  Does the office need to follow up with this patient?: Yes  Instructions For The Office: Unable to find appt within dispositioned time frame x 3 [place] offices; info to office for provider review/appt workin/callback.  krs/can  RN Note:  Patient worried about "kidney infection."  Onset of frequency, urgency 02/03/14.  States she takes Trimpex on a regular basis as a preventative by urologist at [facility].  Afebrile.  States does have back pain, but states no new back pain related to urinary symptoms.  Per urinary symptoms protocol, advised appt today in office, since patient > age 64.  Per Epic, no appts available within dispositioned time frame x 3 [place] offices; patient info to office per patient request for staff/provider review/workin appt.  Patient states she is happy to come by to leave a urine specimen if necessary.  May reach patient at [phone].  krs/can  Symptoms  Reason For Call & Symptoms: urinary symptoms  Reviewed Health History In EMR: Yes  Reviewed Medications In EMR: Yes  Reviewed Allergies In EMR: Yes  Reviewed Surgeries / Procedures: Yes  Date of Onset of Symptoms: 02/03/2014  Guideline(s) Used:  Urination Pain - Female  Disposition Per Guideline:   See Today in Office  Reason For Disposition Reached:   Age > 50 years  Advice Given:  N/A  Patient Will Follow Care Advice:  YES

## 2014-02-04 NOTE — Patient Instructions (Signed)
Drink plenty of water and start the cipro today.  Stop the trimethoprim in the meantime.  We'll contact you with your lab report.  Take care.

## 2014-02-04 NOTE — Telephone Encounter (Signed)
See about the 3:45. Thanks.

## 2014-02-04 NOTE — Progress Notes (Signed)
Pre visit review using our clinic review tool, if applicable. No additional management support is needed unless otherwise documented below in the visit note.  She saw neuro at [facility], hasn't started trileptal or amantadine yet.    In last few days, she has had burning with urination and lower abd pain.  No fevers.  No vomiting.  No new CVA pain.  Prev on TMP for suppression.    Meds, vitals, and allergies reviewed.   ROS: See HPI.  Otherwise negative.    GEN: nad, alert and oriented, chronic postural changes noted HEENT: mucous membranes moist NECK: supple CV: rrr.  PULM: ctab, no inc wob ABD: soft, +bs, suprapubic area not tender EXT: trace edema SKIN: no acute rash BACK: no CVA pain

## 2014-02-04 NOTE — Telephone Encounter (Signed)
Patient notified by telephone that she can be seen today at 3:45 per Dr. Damita Dunnings. Patient stated that she will be here.

## 2014-02-05 DIAGNOSIS — N390 Urinary tract infection, site not specified: Secondary | ICD-10-CM | POA: Insufficient documentation

## 2014-02-05 LAB — URINE CULTURE
Colony Count: NO GROWTH
Organism ID, Bacteria: NO GROWTH

## 2014-02-05 NOTE — Assessment & Plan Note (Signed)
Presumed, start cipro, hold TMP for now, restart TMP when cipro done. She agrees.  Nontoxic. Check ucx.  Fluids in meantime.

## 2014-02-10 ENCOUNTER — Telehealth: Payer: Self-pay | Admitting: Family Medicine

## 2014-02-10 NOTE — Telephone Encounter (Signed)
If the dysuria is some better, then I would stop the abx for now and use the zofran for nausea.  If she has more dysuria in the next 1-2 days, then notify us and we may need to change the abx.  If she improves, then restart the trimethprim in a few days (like she normally would).  Thanks.

## 2014-02-10 NOTE — Telephone Encounter (Signed)
Patient advised.

## 2014-02-10 NOTE — Telephone Encounter (Signed)
Pt states that she was given antibiotic last week and woke up this morning and was vomiting real bad she thinks it from the antibiotic and is wanting to know if something else could be called in or a nausea medication to be called into [facility]. Please advise.

## 2014-03-08 DIAGNOSIS — E785 Hyperlipidemia, unspecified: Secondary | ICD-10-CM | POA: Insufficient documentation

## 2014-03-08 DIAGNOSIS — I10 Essential (primary) hypertension: Secondary | ICD-10-CM | POA: Insufficient documentation

## 2014-03-28 ENCOUNTER — Telehealth: Payer: Self-pay | Admitting: Family Medicine

## 2014-03-28 NOTE — Telephone Encounter (Signed)
Patient Information:  Caller Name: Cenia  Phone: [phone]  Patient: Charizma, Gardiner  Gender: Female  DOB: [birth year]  Age: 68 Years  PCP: Elsie Stain Brigitte Pulse) [facility])  Office Follow Up:  Does the office need to follow up with this patient?: Yes  Instructions For The Office: Contact patient to give further instructions.  RN Note:  Offered to schedule an appointment with Debbrah Alar, NP or Roxy Cedar, PA at another [place] office. Patient denies this. Please contact patient to give further instructions or add appointment to the schedule.  Symptoms  Reason For Call & Symptoms: Reports pinpoint size spots; one on the left side of the face, left elbow, right thigh. Denies pain or injury to these areas. These bumps are dark pink in color.  Reviewed Health History In EMR: Yes  Reviewed Medications In EMR: Yes  Reviewed Allergies In EMR: Yes  Reviewed Surgeries / Procedures: Yes  Date of Onset of Symptoms: 03/18/2014  Guideline(s) Used:  Rash or Redness - Widespread  Disposition Per Guideline:   See Today in Office  Reason For Disposition Reached:   Patient wants to be seen  Advice Given:  N/A  Patient Will Follow Care Advice:  YES

## 2014-03-28 NOTE — Telephone Encounter (Signed)
Patient advised.

## 2014-03-28 NOTE — Telephone Encounter (Signed)
I would either keep the appointment offered or get to UC.

## 2014-03-28 NOTE — Telephone Encounter (Signed)
Lugene, please make sure Dr. Damita Dunnings sees this today.

## 2014-04-10 ENCOUNTER — Telehealth: Payer: Self-pay

## 2014-04-10 NOTE — Telephone Encounter (Signed)
Cynthia Price from [facility] left v/m requesting call in of med ordered by doctor. Did not see an order for med to be sent to pharmacy; spoke with pt and due to closeness to pts home; pt has started going to Dr Ouida Sills at [facility].Advised [facility] would [facility] Ouida Sills. Pt appreciative.

## 2014-05-01 ENCOUNTER — Ambulatory Visit: Payer: Self-pay | Admitting: Hematology and Oncology

## 2014-05-02 LAB — CBC CANCER CENTER
Basophil #: 0.1 x10 3/mm (ref 0.0–0.1)
Basophil %: 1.5 %
EOS ABS: 0.1 x10 3/mm (ref 0.0–0.7)
Eosinophil %: 1.3 %
HCT: 36.7 % (ref 35.0–47.0)
HGB: 12 g/dL (ref 12.0–16.0)
Lymphocyte #: 0.9 x10 3/mm — ABNORMAL LOW (ref 1.0–3.6)
Lymphocyte %: 18 %
MCH: 30.7 pg (ref 26.0–34.0)
MCHC: 32.6 g/dL (ref 32.0–36.0)
MCV: 94 fL (ref 80–100)
MONOS PCT: 12.3 %
Monocyte #: 0.6 x10 3/mm (ref 0.2–0.9)
NEUTROS ABS: 3.4 x10 3/mm (ref 1.4–6.5)
Neutrophil %: 66.9 %
Platelet: 405 x10 3/mm (ref 150–440)
RBC: 3.9 10*6/uL (ref 3.80–5.20)
RDW: 13.8 % (ref 11.5–14.5)
WBC: 5.1 x10 3/mm (ref 3.6–11.0)

## 2014-05-28 ENCOUNTER — Ambulatory Visit: Payer: Self-pay | Admitting: Hematology and Oncology

## 2014-06-12 ENCOUNTER — Ambulatory Visit: Payer: Self-pay | Admitting: Internal Medicine

## 2014-06-27 ENCOUNTER — Ambulatory Visit: Payer: Self-pay | Admitting: Podiatry

## 2014-06-27 ENCOUNTER — Emergency Department: Payer: Self-pay | Admitting: Emergency Medicine

## 2014-06-27 LAB — CBC WITH DIFFERENTIAL/PLATELET
Basophil #: 0.1 10*3/uL (ref 0.0–0.1)
Basophil %: 1.1 %
EOS PCT: 2.1 %
Eosinophil #: 0.1 10*3/uL (ref 0.0–0.7)
HCT: 37.6 % (ref 35.0–47.0)
HGB: 12.4 g/dL (ref 12.0–16.0)
LYMPHS ABS: 1.3 10*3/uL (ref 1.0–3.6)
LYMPHS PCT: 21.4 %
MCH: 31.3 pg (ref 26.0–34.0)
MCHC: 33 g/dL (ref 32.0–36.0)
MCV: 95 fL (ref 80–100)
MONO ABS: 0.7 x10 3/mm (ref 0.2–0.9)
Monocyte %: 10.7 %
NEUTROS ABS: 3.9 10*3/uL (ref 1.4–6.5)
Neutrophil %: 64.7 %
Platelet: 388 10*3/uL (ref 150–440)
RBC: 3.96 10*6/uL (ref 3.80–5.20)
RDW: 14.2 % (ref 11.5–14.5)
WBC: 6.1 10*3/uL (ref 3.6–11.0)

## 2014-06-27 LAB — COMPREHENSIVE METABOLIC PANEL
ALBUMIN: 3.5 g/dL (ref 3.4–5.0)
ANION GAP: 8 (ref 7–16)
Alkaline Phosphatase: 87 U/L
BUN: 13 mg/dL (ref 7–18)
Bilirubin,Total: 0.3 mg/dL (ref 0.2–1.0)
Calcium, Total: 8.2 mg/dL — ABNORMAL LOW (ref 8.5–10.1)
Chloride: 105 mmol/L (ref 98–107)
Co2: 27 mmol/L (ref 21–32)
Creatinine: 0.88 mg/dL (ref 0.60–1.30)
EGFR (Non-African Amer.): >60
GLUCOSE: 89 mg/dL (ref 65–99)
OSMOLALITY: 279 (ref 275–301)
Potassium: 3.8 mmol/L (ref 3.5–5.1)
SGOT(AST): 20 U/L (ref 15–37)
SGPT (ALT): 21 U/L
Sodium: 140 mmol/L (ref 136–145)
Total Protein: 7.5 g/dL (ref 6.4–8.2)

## 2014-07-10 DIAGNOSIS — Z86718 Personal history of other venous thrombosis and embolism: Secondary | ICD-10-CM | POA: Insufficient documentation

## 2014-07-10 DIAGNOSIS — I82409 Acute embolism and thrombosis of unspecified deep veins of unspecified lower extremity: Secondary | ICD-10-CM | POA: Insufficient documentation

## 2014-08-06 ENCOUNTER — Other Ambulatory Visit: Payer: Self-pay | Admitting: Family Medicine

## 2014-08-07 NOTE — Telephone Encounter (Signed)
Electronic refill request, pt had an acute appt on 02/04/14 no future appt., last refilled on 04/2013 with a years worth of refills

## 2014-08-08 NOTE — Telephone Encounter (Signed)
Would continue.  Sent.

## 2014-09-07 DIAGNOSIS — I5032 Chronic diastolic (congestive) heart failure: Secondary | ICD-10-CM | POA: Insufficient documentation

## 2014-09-07 DIAGNOSIS — I5033 Acute on chronic diastolic (congestive) heart failure: Secondary | ICD-10-CM | POA: Insufficient documentation

## 2014-09-07 HISTORY — DX: Chronic diastolic (congestive) heart failure: I50.32

## 2014-11-02 ENCOUNTER — Emergency Department: Payer: Self-pay | Admitting: Emergency Medicine

## 2014-11-02 LAB — URINALYSIS, COMPLETE
BLOOD: NEGATIVE
Bacteria: NONE SEEN
Bilirubin,UR: NEGATIVE
Glucose,UR: NEGATIVE mg/dL (ref 0–75)
KETONE: NEGATIVE
Leukocyte Esterase: NEGATIVE
NITRITE: NEGATIVE
PH: 8 (ref 4.5–8.0)
PROTEIN: NEGATIVE
RBC,UR: <1 /HPF (ref 0–5)
Specific Gravity: 1.011 (ref 1.003–1.030)
Squamous Epithelial: NONE SEEN

## 2014-11-02 LAB — COMPREHENSIVE METABOLIC PANEL
ALK PHOS: 81 U/L
ALT: 26 U/L
ANION GAP: 5 — AB (ref 7–16)
Albumin: 3.4 g/dL (ref 3.4–5.0)
BUN: 14 mg/dL (ref 7–18)
Bilirubin,Total: 0.3 mg/dL (ref 0.2–1.0)
CHLORIDE: 107 mmol/L (ref 98–107)
Calcium, Total: 8.7 mg/dL (ref 8.5–10.1)
Co2: 31 mmol/L (ref 21–32)
Creatinine: 1.01 mg/dL (ref 0.60–1.30)
GFR CALC NON AF AMER: 58 — AB
Glucose: 97 mg/dL (ref 65–99)
Osmolality: 285 (ref 275–301)
POTASSIUM: 3.8 mmol/L (ref 3.5–5.1)
SGOT(AST): 29 U/L (ref 15–37)
Sodium: 143 mmol/L (ref 136–145)
Total Protein: 6.8 g/dL (ref 6.4–8.2)

## 2014-11-02 LAB — CBC
HCT: 37.5 % (ref 35.0–47.0)
HGB: 12.1 g/dL (ref 12.0–16.0)
MCH: 30 pg (ref 26.0–34.0)
MCHC: 32.2 g/dL (ref 32.0–36.0)
MCV: 93 fL (ref 80–100)
PLATELETS: 470 10*3/uL — AB (ref 150–440)
RBC: 4.03 10*6/uL (ref 3.80–5.20)
RDW: 15.7 % — ABNORMAL HIGH (ref 11.5–14.5)
WBC: 6.1 10*3/uL (ref 3.6–11.0)

## 2014-11-02 LAB — TROPONIN I: Troponin-I: <0.02 ng/mL

## 2015-02-05 DIAGNOSIS — M75122 Complete rotator cuff tear or rupture of left shoulder, not specified as traumatic: Secondary | ICD-10-CM | POA: Insufficient documentation

## 2015-02-05 DIAGNOSIS — M65819 Other synovitis and tenosynovitis, unspecified shoulder: Secondary | ICD-10-CM | POA: Insufficient documentation

## 2015-03-05 DIAGNOSIS — I272 Pulmonary hypertension, unspecified: Secondary | ICD-10-CM

## 2015-03-05 HISTORY — DX: Pulmonary hypertension, unspecified: I27.20

## 2015-04-14 DIAGNOSIS — S40019A Contusion of unspecified shoulder, initial encounter: Secondary | ICD-10-CM | POA: Insufficient documentation

## 2015-05-22 ENCOUNTER — Other Ambulatory Visit
Admission: RE | Admit: 2015-05-22 | Discharge: 2015-05-22 | Disposition: A | Discharge status: Home / Self Care | Payer: Commercial Managed Care - HMO | Source: Ambulatory Visit | Attending: Internal Medicine | Admitting: Internal Medicine

## 2015-05-22 DIAGNOSIS — M179 Osteoarthritis of knee, unspecified: Secondary | ICD-10-CM | POA: Insufficient documentation

## 2015-05-22 DIAGNOSIS — M1712 Unilateral primary osteoarthritis, left knee: Secondary | ICD-10-CM | POA: Insufficient documentation

## 2015-05-22 DIAGNOSIS — M25462 Effusion, left knee: Secondary | ICD-10-CM | POA: Diagnosis present

## 2015-05-22 DIAGNOSIS — M25562 Pain in left knee: Secondary | ICD-10-CM | POA: Diagnosis present

## 2015-05-22 DIAGNOSIS — M1710 Unilateral primary osteoarthritis, unspecified knee: Secondary | ICD-10-CM | POA: Insufficient documentation

## 2015-05-22 DIAGNOSIS — M25469 Effusion, unspecified knee: Secondary | ICD-10-CM | POA: Insufficient documentation

## 2015-05-22 LAB — SYNOVIAL CELL COUNT + DIFF, W/ CRYSTALS
Crystals, Fluid: NONE SEEN
Eosinophils-Synovial: 0 %
Lymphocytes-Synovial Fld: 20 %
Monocyte-Macrophage-Synovial Fluid: 11 %
Neutrophil, Synovial: 69 %
WBC, Synovial: 4177 /mm3 — ABNORMAL HIGH (ref 0–200)

## 2015-05-25 LAB — BODY FLUID CULTURE: Culture: NO GROWTH

## 2015-06-05 DIAGNOSIS — IMO0002 Reserved for concepts with insufficient information to code with codable children: Secondary | ICD-10-CM | POA: Insufficient documentation

## 2015-06-05 DIAGNOSIS — S20219A Contusion of unspecified front wall of thorax, initial encounter: Secondary | ICD-10-CM | POA: Insufficient documentation

## 2015-06-14 ENCOUNTER — Emergency Department: Payer: Commercial Managed Care - HMO

## 2015-06-14 ENCOUNTER — Emergency Department
Admission: EM | Admit: 2015-06-14 | Discharge: 2015-06-14 | Disposition: A | Discharge status: Home / Self Care | Payer: Commercial Managed Care - HMO | Attending: Emergency Medicine | Admitting: Emergency Medicine

## 2015-06-14 ENCOUNTER — Encounter: Payer: Self-pay | Admitting: Emergency Medicine

## 2015-06-14 ENCOUNTER — Other Ambulatory Visit: Payer: Self-pay

## 2015-06-14 DIAGNOSIS — Y9289 Other specified places as the place of occurrence of the external cause: Secondary | ICD-10-CM | POA: Insufficient documentation

## 2015-06-14 DIAGNOSIS — Y9389 Activity, other specified: Secondary | ICD-10-CM | POA: Insufficient documentation

## 2015-06-14 DIAGNOSIS — Y998 Other external cause status: Secondary | ICD-10-CM | POA: Diagnosis not present

## 2015-06-14 DIAGNOSIS — W1839XA Other fall on same level, initial encounter: Secondary | ICD-10-CM | POA: Insufficient documentation

## 2015-06-14 DIAGNOSIS — S2220XA Unspecified fracture of sternum, initial encounter for closed fracture: Secondary | ICD-10-CM | POA: Diagnosis not present

## 2015-06-14 DIAGNOSIS — S299XXA Unspecified injury of thorax, initial encounter: Secondary | ICD-10-CM | POA: Diagnosis present

## 2015-06-14 DIAGNOSIS — Z79899 Other long term (current) drug therapy: Secondary | ICD-10-CM | POA: Insufficient documentation

## 2015-06-14 HISTORY — DX: Systemic involvement of connective tissue, unspecified: M35.9

## 2015-06-14 LAB — CBC
HCT: 39.1 % (ref 35.0–47.0)
HEMOGLOBIN: 12.6 g/dL (ref 12.0–16.0)
MCH: 29.1 pg (ref 26.0–34.0)
MCHC: 32.3 g/dL (ref 32.0–36.0)
MCV: 90.3 fL (ref 80.0–100.0)
Platelets: 445 10*3/uL — ABNORMAL HIGH (ref 150–440)
RBC: 4.34 MIL/uL (ref 3.80–5.20)
RDW: 15.7 % — ABNORMAL HIGH (ref 11.5–14.5)
WBC: 7 10*3/uL (ref 3.6–11.0)

## 2015-06-14 LAB — COMPREHENSIVE METABOLIC PANEL
ALK PHOS: 75 U/L (ref 38–126)
ALT: 17 U/L (ref 14–54)
AST: 23 U/L (ref 15–41)
Albumin: 3.8 g/dL (ref 3.5–5.0)
Anion gap: 8 (ref 5–15)
BUN: 18 mg/dL (ref 6–20)
CHLORIDE: 102 mmol/L (ref 101–111)
CO2: 30 mmol/L (ref 22–32)
Calcium: 9.1 mg/dL (ref 8.9–10.3)
Creatinine, Ser: 0.83 mg/dL (ref 0.44–1.00)
GLUCOSE: 99 mg/dL (ref 65–99)
POTASSIUM: 3.9 mmol/L (ref 3.5–5.1)
Sodium: 140 mmol/L (ref 135–145)
Total Bilirubin: 0.4 mg/dL (ref 0.3–1.2)
Total Protein: 7.3 g/dL (ref 6.5–8.1)

## 2015-06-14 LAB — TROPONIN I: Troponin I: <0.03 ng/mL (ref <0.031)

## 2015-06-14 MED ORDER — OXYCODONE-ACETAMINOPHEN 5-325 MG PO TABS: 1.0000 | ORAL_TABLET | Freq: Four times a day (QID) | ORAL | Status: DC | PRN

## 2015-06-14 MED ORDER — IOHEXOL 350 MG/ML SOLN
75.0000 mL | Freq: Once | INTRAVENOUS | Status: AC | PRN
  Administered 2015-06-14: 75 mL via INTRAVENOUS

## 2015-06-14 NOTE — ED Notes (Signed)
Patient transported to CT 

## 2015-06-14 NOTE — ED Notes (Signed)
Pt c/o "hard pain" to her left chest since Friday; constant; radiates up into left shoulder, through to her back and to her left axilla;

## 2015-06-14 NOTE — ED Notes (Signed)
Report from Luis, RN.

## 2015-06-14 NOTE — ED Notes (Signed)
MD at bedside. 

## 2015-06-14 NOTE — ED Notes (Signed)
Pt alert and oriented X4, active, cooperative, pt in NAD. RR even and unlabored, color WNL.  Pt informed to return if any life threatening symptoms occur.   

## 2015-06-14 NOTE — ED Notes (Signed)
Pt assisted back to bed after using restroom.

## 2015-06-14 NOTE — ED Provider Notes (Signed)
[facility] Emergency Department Provider Note  ____________________________________________  Time seen: Approximately 7:50 AM  I have reviewed the triage vital signs and the nursing notes.   HISTORY  Chief Complaint Chest Pain    HPI Cynthia Price is a 68 y.o. female who complains of pain in her back and chest since Friday. Pain is constant in nature and is from approximately the 4 area around to the front of the chest just below the nipple line. There was a bruise over the approximate area of the pain in the T-spine. There is still pain on palpation there which exactly reproduces the pain she is having in that location there is also pain on palpation of the lower part of the sternum in the lateral chest wall which exactly reproduces her pain. The pain is worse with deep breathing or movement. Patient reports she fell about 3 weeks ago but the pain didn't start until this past Friday he does not recall any other injury. He is not really short of breath. The pain is achy in nature and moderate in severity   Past Medical History  Diagnosis Date  . Essential thrombocytosis   . Back pain     s/p hemilaminectomy with h/o herniated disc at L4L5  . IBS (irritable bowel syndrome)   . Macular degeneration   . Depression     after husband died  . RLS (restless legs syndrome)   . Insomnia   . Spinal stenosis     lumbar  . MS (multiple sclerosis)   . Lupus anticoagulant positive   . Anemia 2012    from labs at [facility]  . Urinary retention     with high post void residuals 2013, per [facility]  . Hyperlipidemia   . Blood clot in vein     behind left knee  . Collagen vascular disease     Patient Active Problem List   Diagnosis Date Noted  . UTI (urinary tract infection) 02/05/2014  . Cellulitis, toe 08/18/2013  . Bradycardia 03/26/2013  . Hypotension 03/26/2013  . RLS (restless legs syndrome) 08/31/2012  . DVT (deep venous thrombosis) 05/14/2012  . Risk for  falls 03/16/2012  . Urinary retention 12/04/2011  . CAD (coronary artery disease) 10/18/2011  . Anemia 10/17/2011  . Diastolic dysfunction 33/82/5053  . Tachycardia 10/01/2011  . Thrombocythemia, essential 05/26/2011  . Edema 05/26/2011  . VITAMIN D DEFICIENCY 04/29/2010  . HYPERCHOLESTEROLEMIA 04/29/2010  . DEPRESSION, MILD 04/29/2010  . CYSTOCELE WITHOUT MENTION UTERINE PROLAPSE LAT 04/29/2010  . UNS ADVRS EFF OTH RX MEDICINAL&BIOLOGICAL SBSTNC 04/29/2010  . Multiple sclerosis 04/29/2008  . NEUROPATHY 04/29/2008  . ARTHRITIS 04/29/2008  . FATIGUE 04/29/2008    Past Surgical History  Procedure Laterality Date  . Appendectomy    . Cholecystectomy    . Lumbar laminectomy    . Cholecystectomy      Current Outpatient Rx  Name  Route  Sig  Dispense  Refill  . amantadine (SYMMETREL) 100 MG capsule   Oral   Take 100 mg by mouth 2 (two) times daily.         . AMPYRA 10 MG TB12      daily.         . ciprofloxacin (CIPRO) 250 MG tablet   Oral   Take 1 tablet (250 mg total) by mouth 2 (two) times daily.   10 tablet   0   . gabapentin (NEURONTIN) 600 MG tablet      Take 1 tablet  by mouth three or four times a day.         . hydroxyurea (HYDREA) 500 MG capsule      Take one by mouth Monday, Tuesday, Wed, Thursday, Friday   90 capsule   3   . ondansetron (ZOFRAN) 4 MG tablet   Oral   Take 1 tablet (4 mg total) by mouth every 8 (eight) hours as needed for nausea.   30 tablet   1   . Oxcarbazepine (TRILEPTAL) 300 MG tablet   Oral   Take 300 mg by mouth 2 (two) times daily.         Marland Kitchen oxyCODONE-acetaminophen (ROXICET) 5-325 MG per tablet   Oral   Take 1 tablet by mouth every 6 (six) hours as needed for severe pain.   20 tablet   0   . rOPINIRole (REQUIP) 1 MG tablet      TAKE ONE TABLET FOUR TIMES DAILY   120 tablet   5   . traMADol (ULTRAM) 50 MG tablet   Oral   Take 1 tablet (50 mg total) by mouth every 6 (six) hours as needed.   120 tablet    12   . trimethoprim (TRIMPEX) 100 MG tablet   Oral   Take 1 tablet (100 mg total) by mouth daily.   90 tablet   3     Allergies Atorvastatin  Family History  Problem Relation Age of Onset  . Heart disease Mother   . Thyroid cancer Mother   . Ovarian cancer Mother   . Alcohol abuse Father   . Heart disease Father     Social History History  Substance Use Topics  . Smoking status: Never Smoker   . Smokeless tobacco: Never Used  . Alcohol Use: No    Review of Systems Constitutional: No fever/chills Eyes: No visual changes. ENT: No sore throat. Cardiovascular: See H&P Respiratory: Denies shortness of breath. Gastrointestinal: No abdominal pain.  No nausea, no vomiting.  No diarrhea.  No constipation. Genitourinary: Negative for dysuria. Musculoskeletal: Negative for back pain. Skin: Negative for rash. Neurological: Negative for headaches, focal weakness or numbness.  10-point ROS otherwise negative.  ____________________________________________   PHYSICAL EXAM:  VITAL SIGNS: ED Triage Vitals  Enc Vitals Group     BP 06/14/15 0553 140/68 mmHg     Pulse Rate 06/14/15 0553 70     Resp 06/14/15 0553 15     Temp 06/14/15 0553 97.7 F (36.5 C)     Temp Source 06/14/15 0553 Oral     SpO2 06/14/15 0553 100 %     Weight 06/14/15 0553 120 lb (54.432 kg)     Height 06/14/15 0553 5\' 5"  (1.651 m)     Head Cir --      Peak Flow --      Pain Score 06/14/15 0555 6     Pain Loc --      Pain Edu? --      Excl. in [place]? --     Constitutional: Alert and oriented. Well appearing and in no acute distress. Eyes: Conjunctivae are normal. PERRL. EOMI. Head: Atraumatic. Nose: No congestion/rhinnorhea. Mouth/Throat: Mucous membranes are moist.  Oropharynx non-erythematous. Neck: No stridor.  { Cardiovascular: Normal rate, regular rhythm. Grossly normal heart sounds.  Good peripheral circulation. Respiratory: Normal respiratory effort.  No retractions. Lungs  CTAB. Gastrointestinal: Soft and nontender. No distention. No abdominal bruits. No CVA tenderness. Musculoskeletal: No lower extremity tenderness nor edema.  No joint effusions. See H&P  for details of the chest and back pain Neurologic:  Normal speech and language. No gross focal neurologic deficits are appreciated. No gait instability. Skin:  Skin is warm, dry and intact. No rash noted. Psychiatric: Mood and affect are normal. Speech and behavior are normal.  ____________________________________________   LABS (all labs ordered are listed, but only abnormal results are displayed)  Labs Reviewed  CBC - Abnormal; Notable for the following:    RDW 15.7 (*)    Platelets 445 (*)    All other components within normal limits  COMPREHENSIVE METABOLIC PANEL  TROPONIN I  TROPONIN I   ____________________________________________  EKG  EKG as read and interpreted by me shows normal sinus rhythm at a rate of 78 there are some PACs present there is normal axis EKG appears to be similar to one from 03/26/2013 ____________________________________________  RADIOLOGY  Chest x-ray is read as no acute disease by the radiologist there is slight cardiomegaly.  CT shows a fracture of the sternum otherwise no acute disease. ____________________________________________   PROCEDURES    ____________________________________________   INITIAL IMPRESSION / ASSESSMENT AND PLAN / ED COURSE  Pertinent labs & imaging results that were available during my care of the patient were reviewed by me and considered in my medical decision making (see chart for details). Discussed with Dr. Youlanda Mighty he will follow-up in the office ____________________________________________   FINAL CLINICAL IMPRESSION(S) / ED DIAGNOSES  Final diagnoses:  Sternal fracture, closed, initial encounter      Nena Polio, MD 06/14/15 1130

## 2015-06-14 NOTE — ED Notes (Signed)
Patient transported to X-ray 

## 2015-06-19 DIAGNOSIS — M7581 Other shoulder lesions, right shoulder: Secondary | ICD-10-CM | POA: Insufficient documentation

## 2015-09-15 ENCOUNTER — Encounter: Payer: Self-pay | Admitting: *Deleted

## 2015-09-15 DIAGNOSIS — G35 Multiple sclerosis: Secondary | ICD-10-CM | POA: Insufficient documentation

## 2015-09-16 ENCOUNTER — Ambulatory Visit: Payer: Commercial Managed Care - HMO | Admitting: Obstetrics and Gynecology

## 2015-09-17 ENCOUNTER — Ambulatory Visit (INDEPENDENT_AMBULATORY_CARE_PROVIDER_SITE_OTHER): Payer: Commercial Managed Care - HMO | Admitting: Obstetrics and Gynecology

## 2015-09-17 ENCOUNTER — Encounter: Payer: Self-pay | Admitting: Obstetrics and Gynecology

## 2015-09-17 VITALS — BP 115/83 | HR 86 | Resp 16 | Ht 65.0 in | Wt 120.4 lb

## 2015-09-17 DIAGNOSIS — N8110 Cystocele, unspecified: Secondary | ICD-10-CM

## 2015-09-17 DIAGNOSIS — N390 Urinary tract infection, site not specified: Secondary | ICD-10-CM

## 2015-09-17 DIAGNOSIS — IMO0002 Reserved for concepts with insufficient information to code with codable children: Secondary | ICD-10-CM

## 2015-09-17 LAB — BLADDER SCAN AMB NON-IMAGING

## 2015-09-17 LAB — URINALYSIS, COMPLETE
Bilirubin, UA: NEGATIVE
GLUCOSE, UA: NEGATIVE
Ketones, UA: NEGATIVE
LEUKOCYTES UA: NEGATIVE
Nitrite, UA: NEGATIVE
PH UA: 6.5 (ref 5.0–7.5)
PROTEIN UA: NEGATIVE
RBC, UA: NEGATIVE
Specific Gravity, UA: 1.015 (ref 1.005–1.030)
UUROB: 0.2 mg/dL (ref 0.2–1.0)

## 2015-09-17 LAB — MICROSCOPIC EXAMINATION
BACTERIA UA: NONE SEEN
RBC, UA: NONE SEEN /hpf (ref 0–?)
RENAL EPITHEL UA: NONE SEEN /HPF
WBC, UA: NONE SEEN /hpf (ref 0–?)

## 2015-09-17 NOTE — Progress Notes (Signed)
09/17/2015 4:43 PM   Cynthia Price 02-17-1946 [id number]  Referring provider: [name], MD [place], [place] [address]  Chief Complaint  Patient presents with  . Recurrent UTI  . 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  She reports that her cystocele has worsened over the last year with associated frequent urinary tract infections. She states that she frequently has to splint and straining to void.  She was recently seen by her GYN doctor in pessary placement was attempted but ultimately deemed unsuccessful because patient continued to have difficulty voiding after was placed.  Seen by Cynthia Price in 2013.  UDS demonstrating hypotonic bladder.  She was instructed to start performing CIC twice daily at that but she reports that she stopped CIC after a few months and failed to return for follow up   She is currently taking daily trimethoprin or UTI prevention.  PMH: Past Medical History  Diagnosis Date  . Essential thrombocytosis ([place])   . Back pain     s/p hemilaminectomy with h/o herniated disc at L4L5  . IBS (irritable bowel syndrome)   . Macular degeneration   . Depression     after husband died  . RLS (restless legs syndrome)   . Insomnia   . Spinal stenosis     lumbar  . MS (multiple sclerosis) ([place])   . Lupus anticoagulant positive   . Anemia 2012    from labs at [facility]  . Urinary retention     with high post void residuals 2013, per [facility]  . Hyperlipidemia   . Blood clot in vein     behind left knee  . Collagen vascular disease [facility])     Surgical History: Past Surgical History  Procedure Laterality Date  . Appendectomy    . Cholecystectomy    . Lumbar laminectomy    . Cholecystectomy      Home Medications:    Medication  List       This list is accurate as of: 09/17/15  4:43 PM.  Always use your most recent med list.               amantadine 100 MG capsule  Commonly known as:  SYMMETREL  Take 100 mg by mouth 2 (two) times daily.     AMPYRA 10 MG Tb12  Generic drug:  dalfampridine  daily.     gabapentin 600 MG tablet  Commonly known as:  NEURONTIN  Take 1 tablet by mouth three or four times a day.     hydroxyurea 500 MG capsule  Commonly known as:  HYDREA  Take one by mouth Monday, Tuesday, Wed, Thursday, Friday     ondansetron 4 MG tablet  Commonly known as:  ZOFRAN  Take 1 tablet (4 mg total) by mouth every 8 (eight) hours as needed for nausea.     oxyCODONE-acetaminophen 5-325 MG tablet  Commonly known as:  ROXICET  Take 1 tablet by mouth every 6 (six) hours as needed for severe pain.     potassium chloride 10 MEQ tablet  Commonly known as:  K-DUR  Take by mouth.     rOPINIRole 1 MG tablet  Commonly known as:  REQUIP  TAKE ONE TABLET FOUR TIMES DAILY     traMADol 50 MG tablet  Commonly known  as:  ULTRAM  Take 1 tablet (50 mg total) by mouth every 6 (six) hours as needed.     trimethoprim 100 MG tablet  Commonly known as:  TRIMPEX  Take 1 tablet (100 mg total) by mouth daily.     XARELTO 20 MG Tabs tablet  Generic drug:  rivaroxaban        Allergies:  Allergies  Allergen Reactions  . Atorvastatin     Muscle aches at >40mg      Family History: Family History  Problem Relation Age of Onset  . Heart disease Mother   . Thyroid cancer Mother   . Ovarian cancer Mother   . Alcohol abuse Father   . Heart disease Father     Social History:  reports that she has never smoked. She has never used smokeless tobacco. She reports that she does not drink alcohol or use illicit drugs.  ROS: UROLOGY Frequent Urination?: Yes Hard to postpone urination?: Yes Burning/pain with urination?: No Get up at night to urinate?: Yes Leakage of urine?: No Urine stream starts and  stops?: Yes Trouble starting stream?: Yes Do you have to strain to urinate?: No Blood in urine?: No Urinary tract infection?: No Sexually transmitted disease?: No Injury to kidneys or bladder?: Yes Painful intercourse?: No Weak stream?: No Currently pregnant?: No Vaginal bleeding?: No Last menstrual period?: n  Gastrointestinal Nausea?: Yes Vomiting?: No Indigestion/heartburn?: Yes Diarrhea?: Yes Constipation?: Yes  Constitutional Fever: No Night sweats?: Yes Weight loss?: Yes Fatigue?: Yes  Skin Skin rash/lesions?: Yes Itching?: Yes  Eyes Blurred vision?: No Double vision?: No  Ears/Nose/Throat Sore throat?: No Sinus problems?: Yes  Hematologic/Lymphatic Swollen glands?: No Easy bruising?: Yes  Cardiovascular Leg swelling?: Yes Chest pain?: No  Respiratory Cough?: No Shortness of breath?: No  Endocrine Excessive thirst?: No  Musculoskeletal Back pain?: No Joint pain?: No  Neurological Headaches?: No Dizziness?: No  Psychologic Depression?: No Anxiety?: No  Physical Exam: BP 115/83 mmHg  Pulse 86  Resp 16  Ht 5\' 5"  (1.651 m)  Wt 120 lb 6.4 oz (54.613 kg)  BMI 20.04 kg/m2  Constitutional:  Alert and oriented, No acute distress. HEENT: [place] AT, moist mucus membranes.  Trachea midline, no masses. Cardiovascular: No clubbing, cyanosis, or edema. Respiratory: Normal respiratory effort, no increased work of breathing. GI: Abdomen is soft, nontender, nondistended, no abdominal masses GU: No CVA tenderness. Grade for cystocele with valsalva,Grade 1 rectocele, no demonstrable SUI Skin: No rashes, bruises or suspicious lesions. Lymph: No cervical or inguinal adenopathy. Neurologic: Grossly intact, no focal deficits, moving all 4 extremities. Psychiatric: Normal mood and affect.  Laboratory Data:   Urinalysis Results for orders placed or performed in visit on 09/17/15  Microscopic Examination  Result Value Ref Range   WBC, UA None seen 0 -   5 /hpf   RBC, UA None seen 0 -  2 /hpf   Epithelial Cells (non renal) 0-10 0 - 10 /hpf   Renal Epithel, UA None seen None seen /hpf   Bacteria, UA None seen None seen/Few  Urinalysis, Complete  Result Value Ref Range   Specific Gravity, UA 1.015 1.005 - 1.030   pH, UA 6.5 5.0 - 7.5   Color, UA Yellow Yellow   Appearance Ur Clear Clear   Leukocytes, UA Negative Negative   Protein, UA Negative Negative/Trace   Glucose, UA Negative Negative   Ketones, UA Negative Negative   RBC, UA Negative Negative   Bilirubin, UA Negative Negative   Urobilinogen, Ur 0.2 0.2 -  1.0 mg/dL   Nitrite, UA Negative Negative   Microscopic Examination See below:   BLADDER SCAN AMB NON-IMAGING  Result Value Ref Range   Scan Result 280 mL      Pertinent Imaging:   Assessment & Plan:    1. Frequent UTI- UA clear today. Continue Trimethoprin daily.  Patient instructed to restart CIC twice daily. I will have her scheduled for f/u with Dr. Matilde Sprang for further evaluation.  - Urinalysis, Complete - BLADDER SCAN AMB NON-IMAGING  2. Hypotonic Bladder- Per UDS performed at [facility].  See care everywhere for report.  3. Urinary Retention- PVR 221mL. Patient with a long-standing history since 2013 of urinary retention issues. She reports that her symptoms have recently become worse since her bladder has dropped.  3. Bladder Prolapse- Grade 4 cystocele on exam today. Pessary placement was attempted by her GYN and was ultimately deemed unsuccessful because they sent experience difficulty voiding after the pessary was placed.  Return for Dr. Matilde Sprang.  These notes generated with voice recognition software. I apologize for typographical errors.  Herbert Moors, [facility] [address] [place], [place] [address] [phone]

## 2015-09-25 ENCOUNTER — Encounter: Payer: Self-pay | Admitting: Urology

## 2015-09-25 ENCOUNTER — Ambulatory Visit (INDEPENDENT_AMBULATORY_CARE_PROVIDER_SITE_OTHER): Payer: Commercial Managed Care - HMO | Admitting: Urology

## 2015-09-25 VITALS — BP 116/70 | HR 86 | Ht 64.0 in | Wt 121.1 lb

## 2015-09-25 DIAGNOSIS — N390 Urinary tract infection, site not specified: Secondary | ICD-10-CM | POA: Diagnosis not present

## 2015-09-25 DIAGNOSIS — R339 Retention of urine, unspecified: Secondary | ICD-10-CM

## 2015-09-25 DIAGNOSIS — N8112 Cystocele, lateral: Secondary | ICD-10-CM | POA: Diagnosis not present

## 2015-09-25 DIAGNOSIS — N3941 Urge incontinence: Secondary | ICD-10-CM

## 2015-09-25 LAB — URINALYSIS, COMPLETE
BILIRUBIN UA: NEGATIVE
Glucose, UA: NEGATIVE
Ketones, UA: NEGATIVE
Leukocytes, UA: NEGATIVE
NITRITE UA: NEGATIVE
PH UA: 7 (ref 5.0–7.5)
Protein, UA: NEGATIVE
RBC UA: NEGATIVE
Specific Gravity, UA: 1.02 (ref 1.005–1.030)
UUROB: 0.2 mg/dL (ref 0.2–1.0)

## 2015-09-25 LAB — MICROSCOPIC EXAMINATION
BACTERIA UA: NONE SEEN
EPITHELIAL CELLS (NON RENAL): NONE SEEN /HPF (ref 0–10)
RBC MICROSCOPIC, UA: NONE SEEN /HPF (ref 0–?)
RENAL EPITHEL UA: NONE SEEN /HPF
WBC UA: NONE SEEN /HPF (ref 0–?)

## 2015-09-25 LAB — BLADDER SCAN AMB NON-IMAGING: Scan Result: 216

## 2015-09-25 NOTE — Progress Notes (Signed)
Bladder Scan Patient  void: 216 ml Performed By: Larna Daughters

## 2015-09-25 NOTE — Progress Notes (Signed)
09/25/2015 10:13 AM   Cynthia Price 10/11/1946 [id number]  Referring provider: [name], MD [facility] [place], [place] [address]  Chief Complaint  Patient presents with  . Bladder Prolapse    frequent UTI, pt instructed to CIC twice daily, but have not been able to cath.     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 She reports that her cystocele has worsened over the last year with associated frequent urinary tract infections. She states that she frequently has to splint and straining to void. She was recently seen by her GYN doctor in pessary placement was attempted but ultimately deemed unsuccessful because patient continued to have difficulty voiding after was placed. Seen by Michelene Heady in 2013. UDS demonstrating hypotonic bladder. She was instructed to start performing CIC twice daily at that but she reports that she stopped CIC after a few months and failed to return for follow up  She is currently taking daily trimethoprin or UTI prevention.  The patient has multiple sclerosis for more than 35 years. She is ongoing significant flow symptoms. She'll hesitate. She'll heal urgency and then she cannot urinate. She'll lay down and her flow improves perhaps when the prolapse is reduced.  She reports urge incontinence and I don't think she has stress incontinence. She denies enuresis. She gets up approximately 1 time per night. She likely voids approximately every 1 hour and has difficulty sitting through it to our movie. She is not a great historian  She denies a history of previous GU surgery kidney stones and she's not had a hysterectomy. She's had lower back surgery.  She did not void when her gynecologist try to place a pessary. She was assessed at [facility] and was told she had a  poorly contractile bladder by history. She has difficulty traveling there are now due to her husband's health.  Modifying factors: There are no other modifying factors  Associated signs and symptoms: There are no other associated signs and symptoms Aggravating and relieving factors: There are no other aggravating or relieving factors Severity: Moderate Duration: Persistent   PMH: Past Medical History  Diagnosis Date  . Essential thrombocytosis ([place])   . Back pain     s/p hemilaminectomy with h/o herniated disc at L4L5  . IBS (irritable bowel syndrome)   . Macular degeneration   . Depression     after husband died  . RLS (restless legs syndrome)   . Insomnia   . Spinal stenosis     lumbar  . MS (multiple sclerosis) ([place])   . Lupus anticoagulant positive   . Anemia 2012    from labs at [facility]  . Urinary retention     with high post void residuals 2013, per [facility]  . Hyperlipidemia   . Blood clot in vein     behind left knee  . Collagen vascular disease ([place])   . CAD (coronary artery disease) 10/18/2011  . DVT (deep venous thrombosis) ([place]) 05/14/2012  . Hypotension 03/26/2013  . Chronic diastolic heart failure ([place]) 09/07/2014    Last Assessment & Plan:  Edema and sob seemingly baseline   . BP (high blood pressure) 03/08/2014    Last Assessment & Plan:  Blood pressure has been controlled without significant dizzyness or associated fatigue. Taking antihypertensives as directed without difficulty.    . 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 NEUROPATHY 04/29/2008    Per Neurology at [facility]    . Multiple sclerosis ([place]) 04/29/2008    Per neurology at [facility]      Surgical History: Past Surgical History  Procedure Laterality Date  . Appendectomy    . Cholecystectomy    . Lumbar laminectomy    . Cholecystectomy      Home Medications:    Medication List       This list is accurate as of: 09/25/15 10:13 AM.  Always use your most recent med list.                 amantadine 100 MG capsule  Commonly known as:  SYMMETREL  Take 100 mg by mouth 2 (two) times daily.     AMPYRA 10 MG Tb12  Generic drug:  dalfampridine  daily.     gabapentin 600 MG tablet  Commonly known as:  NEURONTIN  Take 1 tablet by mouth three or four times a day.     hydroxyurea 500 MG capsule  Commonly known as:  HYDREA  Take one by mouth Monday, Tuesday, Wed, Thursday, Friday     potassium chloride 10 MEQ tablet  Commonly known as:  K-DUR  Take by mouth.     rOPINIRole 1 MG tablet  Commonly known as:  REQUIP  TAKE ONE TABLET FOUR TIMES DAILY     traMADol 50 MG tablet  Commonly known as:  ULTRAM  Take 1 tablet (50 mg total) by mouth every 6 (six) hours as needed.     trimethoprim 100 MG tablet  Commonly known as:  TRIMPEX  Take 1 tablet (100 mg total) by mouth daily.     XARELTO 20 MG Tabs tablet  Generic drug:  rivaroxaban        Allergies:  Allergies  Allergen Reactions  . Atorvastatin     Muscle aches at >40mg      Family History: Family History  Problem Relation Age of Onset  . Heart disease Mother   . Thyroid cancer Mother   . Ovarian cancer Mother   . Alcohol abuse Father   . Heart disease Father     Social History:  reports that she has never smoked. She has never used smokeless tobacco. She reports that she does not drink alcohol or use illicit drugs.  ROS: UROLOGY Frequent Urination?: No Hard to postpone urination?: No Burning/pain with urination?: Yes Get up at night to urinate?: No Leakage of urine?: Yes Urine stream starts and stops?: Yes Trouble starting stream?: Yes Do you have to strain to urinate?: No Blood in urine?: Yes Urinary tract infection?: No Sexually transmitted disease?: No Injury to kidneys or bladder?: No Painful intercourse?: No Weak stream?: Yes Currently pregnant?: No Vaginal bleeding?: No Last menstrual period?: No  Gastrointestinal Nausea?: No Vomiting?: No Indigestion/heartburn?:  No Diarrhea?: No Constipation?: Yes  Constitutional Fever: No Night sweats?: No Weight loss?: Yes Fatigue?: Yes  Skin Skin rash/lesions?: No Itching?: No  Eyes Blurred vision?: Yes Double vision?: No  Ears/Nose/Throat Sore throat?: No Sinus problems?: No  Hematologic/Lymphatic Swollen glands?: No Easy bruising?: Yes  Cardiovascular Leg swelling?: Yes Chest pain?: No  Respiratory Cough?: No Shortness of breath?: No  Endocrine Excessive thirst?: No  Musculoskeletal Back pain?: Yes Joint pain?: No  Neurological Headaches?: No Dizziness?: Yes  Psychologic Depression?: No Anxiety?: No  Physical Exam: BP 116/70 mmHg  Pulse 86  Ht 5\' 4"  (1.626 m)  Wt 121 lb 1.6 oz (  54.931 kg)  BMI 20.78 kg/m2  Constitutional:  Alert and oriented, No acute distress. HEENT: [place] AT, moist mucus membranes.  Trachea midline, no masses. Cardiovascular: No clubbing, cyanosis, or edema. Respiratory: Normal respiratory effort, no increased work of breathing. GI: Abdomen is soft, nontender, nondistended, no abdominal masses GU: No CVA tenderness. On pelvic examination the patient uterus was exiting the introitus associated with a grade 3 cystocele and moderate central defect. Once I reduced the prolapse she could not bear down hard to reduplicate the prolapse. She could not cough hard. She did not leak. She mild diffuse grade 2 rectocele Skin: No rashes, bruises or suspicious lesions. Lymph: No cervical or inguinal adenopathy. Neurologic: Grossly intact, no focal deficits, moving all 4 extremities. Psychiatric: Normal mood and affect.  Laboratory Data: Lab Results  Component Value Date   WBC 7.0 06/14/2015   HGB 12.6 06/14/2015   HCT 39.1 06/14/2015   MCV 90.3 06/14/2015   PLT 445* 06/14/2015    Lab Results  Component Value Date   CREATININE 0.83 06/14/2015    No results found for: PSA  No results found for: TESTOSTERONE  No results found for: HGBA1C  Urinalysis     Component Value Date/Time   COLORURINE Straw 11/02/2014 1914   COLORURINE yellow 04/29/2010 0952   APPEARANCEUR Clear 11/02/2014 1914   APPEARANCEUR Clear 04/29/2010 0952   LABSPEC 1.011 11/02/2014 1914   LABSPEC 1.020 04/29/2010 0952   PHURINE 8.0 11/02/2014 1914   PHURINE 7.0 04/29/2010 0952   GLUCOSEU Negative 09/17/2015 1455   GLUCOSEU Negative 11/02/2014 1914   HGBUR Negative 11/02/2014 1914   HGBUR trace-intact 04/29/2010 0952   BILIRUBINUR Negative 09/17/2015 1455   BILIRUBINUR Negative 11/02/2014 1914   BILIRUBINUR Neg 02/04/2014 1603   BILIRUBINUR negative 04/29/2010 0952   KETONESUR Negative 11/02/2014 1914   PROTEINUR Negative 11/02/2014 1914   PROTEINUR Neg 02/04/2014 1603   UROBILINOGEN negative 02/04/2014 1603   UROBILINOGEN 0.2 04/29/2010 0952   NITRITE Negative 09/17/2015 1455   NITRITE Negative 11/02/2014 1914   NITRITE Neg 02/04/2014 1603   NITRITE negative 04/29/2010 0952   LEUKOCYTESUR Negative 09/17/2015 1455   LEUKOCYTESUR Negative 11/02/2014 1914   LEUKOCYTESUR moderate (2+) 02/04/2014 1603    Pertinent Imaging: Bladder ultrasound: 216 mL  Assessment & Plan:  The patient describes significant pelvic organ prolapse that she reduces spontaneously. Her weak flow may be secondary to it. She has urge incontinence. She has significant risk factors and likely has a neurogenic bladder with increased frequency.   the patient has incomplete bladder emptying and was taught how to perform self-catheterization for residual of 280 mL. Her residual today is now 216 mL. She's not able to catheterize herself.  The patient also has recurrent urinary tract infections. She has approximate 3. I respond favorably to antibiotics. She's on daily trimethoprim and does not think she is infected today. She was catheterized and the urine was sent for culture  She wears approximately 2-3 pads a day moderately wet  She has not had a recent renal x-ray to rule out silent  hydronephrosis and to assess her upper tracts    1. Recurrent UTI 2. Neurogenic bladder 3. Urgency incontinence 4. Urinary frequency   - Urinalysis, Complete  2. CYSTOCELE WITHOUT MENTION UTERINE PROLAPSE LAT  - Bladder Scan (Post Void Residual) in office   Return in about 4 months (around 01/26/2016).  Reece Packer, MD  [facility] [address], [place], [place] [address] [phone]

## 2015-09-25 NOTE — Addendum Note (Signed)
Addended by: Wilson Singer on: 09/25/2015 10:19 AM   Modules accepted: Orders

## 2015-09-27 LAB — CULTURE, URINE COMPREHENSIVE

## 2015-10-01 ENCOUNTER — Ambulatory Visit: Payer: Self-pay

## 2015-10-19 ENCOUNTER — Ambulatory Visit: Payer: Commercial Managed Care - HMO

## 2015-10-27 ENCOUNTER — Telehealth: Payer: Self-pay | Admitting: Urology

## 2015-10-27 NOTE — Telephone Encounter (Signed)
Pt called and needs a prescription sent to [facility] for her catheters.  Only has a few left and was needing some extra from our office until she can get her prescription filled.  Please call [phone].

## 2015-10-28 NOTE — Telephone Encounter (Signed)
Pt information was given to Legrand Como, the [employer] rep. Legrand Como stated he would take care of it.

## 2015-12-22 DIAGNOSIS — D473 Essential (hemorrhagic) thrombocythemia: Secondary | ICD-10-CM | POA: Insufficient documentation

## 2015-12-22 DIAGNOSIS — R339 Retention of urine, unspecified: Secondary | ICD-10-CM | POA: Insufficient documentation

## 2016-01-29 ENCOUNTER — Ambulatory Visit: Payer: Commercial Managed Care - HMO | Admitting: Urology

## 2016-02-05 ENCOUNTER — Ambulatory Visit: Payer: Commercial Managed Care - HMO | Admitting: Urology

## 2016-02-25 DIAGNOSIS — N813 Complete uterovaginal prolapse: Secondary | ICD-10-CM | POA: Insufficient documentation

## 2016-03-01 DIAGNOSIS — R9401 Abnormal electroencephalogram [EEG]: Secondary | ICD-10-CM | POA: Diagnosis not present

## 2016-03-01 DIAGNOSIS — R4189 Other symptoms and signs involving cognitive functions and awareness: Secondary | ICD-10-CM | POA: Diagnosis not present

## 2016-03-04 DIAGNOSIS — M1712 Unilateral primary osteoarthritis, left knee: Secondary | ICD-10-CM | POA: Diagnosis not present

## 2016-03-04 DIAGNOSIS — M25462 Effusion, left knee: Secondary | ICD-10-CM | POA: Diagnosis not present

## 2016-03-07 DIAGNOSIS — I5032 Chronic diastolic (congestive) heart failure: Secondary | ICD-10-CM | POA: Diagnosis not present

## 2016-03-07 DIAGNOSIS — I10 Essential (primary) hypertension: Secondary | ICD-10-CM | POA: Diagnosis not present

## 2016-03-07 DIAGNOSIS — G35 Multiple sclerosis: Secondary | ICD-10-CM | POA: Diagnosis not present

## 2016-03-07 DIAGNOSIS — E782 Mixed hyperlipidemia: Secondary | ICD-10-CM | POA: Diagnosis not present

## 2016-03-08 DIAGNOSIS — Z01810 Encounter for preprocedural cardiovascular examination: Secondary | ICD-10-CM | POA: Diagnosis not present

## 2016-03-08 DIAGNOSIS — I10 Essential (primary) hypertension: Secondary | ICD-10-CM | POA: Diagnosis not present

## 2016-03-09 DIAGNOSIS — N398 Other specified disorders of urinary system: Secondary | ICD-10-CM | POA: Diagnosis not present

## 2016-03-09 DIAGNOSIS — N813 Complete uterovaginal prolapse: Secondary | ICD-10-CM | POA: Diagnosis not present

## 2016-04-14 DIAGNOSIS — G35 Multiple sclerosis: Secondary | ICD-10-CM | POA: Diagnosis not present

## 2016-04-14 DIAGNOSIS — G20 Parkinson's disease: Secondary | ICD-10-CM | POA: Diagnosis not present

## 2016-04-14 DIAGNOSIS — G2581 Restless legs syndrome: Secondary | ICD-10-CM | POA: Diagnosis not present

## 2016-04-14 DIAGNOSIS — R258 Other abnormal involuntary movements: Secondary | ICD-10-CM | POA: Diagnosis not present

## 2016-04-14 DIAGNOSIS — R4189 Other symptoms and signs involving cognitive functions and awareness: Secondary | ICD-10-CM | POA: Diagnosis not present

## 2016-04-15 DIAGNOSIS — R339 Retention of urine, unspecified: Secondary | ICD-10-CM | POA: Diagnosis not present

## 2016-04-18 DIAGNOSIS — M75122 Complete rotator cuff tear or rupture of left shoulder, not specified as traumatic: Secondary | ICD-10-CM | POA: Diagnosis not present

## 2016-04-18 DIAGNOSIS — S20211A Contusion of right front wall of thorax, initial encounter: Secondary | ICD-10-CM | POA: Diagnosis not present

## 2016-04-18 DIAGNOSIS — S40011A Contusion of right shoulder, initial encounter: Secondary | ICD-10-CM | POA: Diagnosis not present

## 2016-04-18 DIAGNOSIS — M65812 Other synovitis and tenosynovitis, left shoulder: Secondary | ICD-10-CM | POA: Diagnosis not present

## 2016-04-20 DIAGNOSIS — N398 Other specified disorders of urinary system: Secondary | ICD-10-CM | POA: Diagnosis not present

## 2016-05-02 DIAGNOSIS — G35 Multiple sclerosis: Secondary | ICD-10-CM | POA: Diagnosis not present

## 2016-05-02 DIAGNOSIS — I5032 Chronic diastolic (congestive) heart failure: Secondary | ICD-10-CM | POA: Diagnosis not present

## 2016-05-02 DIAGNOSIS — E782 Mixed hyperlipidemia: Secondary | ICD-10-CM | POA: Diagnosis not present

## 2016-05-02 DIAGNOSIS — I10 Essential (primary) hypertension: Secondary | ICD-10-CM | POA: Diagnosis not present

## 2016-05-02 DIAGNOSIS — I825Y2 Chronic embolism and thrombosis of unspecified deep veins of left proximal lower extremity: Secondary | ICD-10-CM | POA: Diagnosis not present

## 2016-05-04 DIAGNOSIS — G35 Multiple sclerosis: Secondary | ICD-10-CM | POA: Diagnosis not present

## 2016-05-04 DIAGNOSIS — Z79899 Other long term (current) drug therapy: Secondary | ICD-10-CM | POA: Diagnosis not present

## 2016-05-04 DIAGNOSIS — D473 Essential (hemorrhagic) thrombocythemia: Secondary | ICD-10-CM | POA: Diagnosis not present

## 2016-05-04 DIAGNOSIS — R269 Unspecified abnormalities of gait and mobility: Secondary | ICD-10-CM | POA: Diagnosis not present

## 2016-05-04 DIAGNOSIS — R2681 Unsteadiness on feet: Secondary | ICD-10-CM | POA: Diagnosis not present

## 2016-05-04 DIAGNOSIS — I10 Essential (primary) hypertension: Secondary | ICD-10-CM | POA: Diagnosis not present

## 2016-05-17 DIAGNOSIS — R339 Retention of urine, unspecified: Secondary | ICD-10-CM | POA: Diagnosis not present

## 2016-05-17 DIAGNOSIS — M25562 Pain in left knee: Secondary | ICD-10-CM | POA: Diagnosis not present

## 2016-05-17 DIAGNOSIS — I5032 Chronic diastolic (congestive) heart failure: Secondary | ICD-10-CM | POA: Diagnosis not present

## 2016-05-23 DIAGNOSIS — M545 Low back pain: Secondary | ICD-10-CM | POA: Diagnosis not present

## 2016-05-23 DIAGNOSIS — M47816 Spondylosis without myelopathy or radiculopathy, lumbar region: Secondary | ICD-10-CM | POA: Insufficient documentation

## 2016-05-23 DIAGNOSIS — M25559 Pain in unspecified hip: Secondary | ICD-10-CM | POA: Diagnosis not present

## 2016-05-23 DIAGNOSIS — M25462 Effusion, left knee: Secondary | ICD-10-CM | POA: Diagnosis not present

## 2016-05-23 DIAGNOSIS — M1712 Unilateral primary osteoarthritis, left knee: Secondary | ICD-10-CM | POA: Diagnosis not present

## 2016-05-26 DIAGNOSIS — G35 Multiple sclerosis: Secondary | ICD-10-CM | POA: Diagnosis not present

## 2016-05-30 DIAGNOSIS — R300 Dysuria: Secondary | ICD-10-CM | POA: Diagnosis not present

## 2016-06-02 ENCOUNTER — Ambulatory Visit: Payer: PPO | Admitting: Physical Therapy

## 2016-06-07 ENCOUNTER — Ambulatory Visit: Payer: PPO | Admitting: Physical Therapy

## 2016-06-09 ENCOUNTER — Ambulatory Visit: Payer: PPO | Admitting: Physical Therapy

## 2016-06-14 ENCOUNTER — Ambulatory Visit: Payer: PPO | Admitting: Physical Therapy

## 2016-06-16 ENCOUNTER — Ambulatory Visit: Payer: PPO | Admitting: Physical Therapy

## 2016-06-16 DIAGNOSIS — I5032 Chronic diastolic (congestive) heart failure: Secondary | ICD-10-CM | POA: Diagnosis not present

## 2016-06-17 DIAGNOSIS — G2581 Restless legs syndrome: Secondary | ICD-10-CM | POA: Diagnosis not present

## 2016-06-17 DIAGNOSIS — R2681 Unsteadiness on feet: Secondary | ICD-10-CM | POA: Diagnosis not present

## 2016-06-17 DIAGNOSIS — G35 Multiple sclerosis: Secondary | ICD-10-CM | POA: Diagnosis not present

## 2016-06-17 DIAGNOSIS — G20 Parkinson's disease: Secondary | ICD-10-CM | POA: Diagnosis not present

## 2016-06-20 DIAGNOSIS — M7581 Other shoulder lesions, right shoulder: Secondary | ICD-10-CM | POA: Diagnosis not present

## 2016-06-20 DIAGNOSIS — M1712 Unilateral primary osteoarthritis, left knee: Secondary | ICD-10-CM | POA: Diagnosis not present

## 2016-06-20 DIAGNOSIS — S93402A Sprain of unspecified ligament of left ankle, initial encounter: Secondary | ICD-10-CM | POA: Diagnosis not present

## 2016-06-21 ENCOUNTER — Ambulatory Visit: Payer: PPO | Admitting: Physical Therapy

## 2016-06-22 DIAGNOSIS — N813 Complete uterovaginal prolapse: Secondary | ICD-10-CM | POA: Diagnosis not present

## 2016-06-22 DIAGNOSIS — Z4689 Encounter for fitting and adjustment of other specified devices: Secondary | ICD-10-CM | POA: Diagnosis not present

## 2016-06-24 ENCOUNTER — Ambulatory Visit: Payer: PPO | Admitting: Physical Therapy

## 2016-06-24 DIAGNOSIS — R339 Retention of urine, unspecified: Secondary | ICD-10-CM | POA: Diagnosis not present

## 2016-07-06 DIAGNOSIS — R339 Retention of urine, unspecified: Secondary | ICD-10-CM | POA: Diagnosis not present

## 2016-07-06 DIAGNOSIS — N813 Complete uterovaginal prolapse: Secondary | ICD-10-CM | POA: Diagnosis not present

## 2016-07-07 ENCOUNTER — Encounter: Payer: Self-pay | Admitting: Physical Therapy

## 2016-07-07 ENCOUNTER — Ambulatory Visit: Payer: PPO | Attending: Neurology | Admitting: Physical Therapy

## 2016-07-07 DIAGNOSIS — M6281 Muscle weakness (generalized): Secondary | ICD-10-CM | POA: Diagnosis not present

## 2016-07-07 DIAGNOSIS — R262 Difficulty in walking, not elsewhere classified: Secondary | ICD-10-CM | POA: Diagnosis not present

## 2016-07-07 NOTE — Therapy (Signed)
[place] MAIN [facility] SERVICES [address] [place], [place], [address] Phone: [phone]   Fax:  [phone]  Physical Therapy Evaluation  Patient Details  Name: Cynthia Price MRN: [id number] Date of Birth: March 04, 1946 Referring Provider: Reinaldo Berber III  Encounter Date: 07/07/2016      PT End of Session - 07/07/16 1527    Visit Number 1   Number of Visits 25   Date for PT Re-Evaluation 10-24-2016   Authorization Type g codes   Authorization Time Period 1/10   PT Start Time 0315   PT Stop Time 0415   PT Time Calculation (min) 60 min   Equipment Utilized During Treatment Gait belt   Activity Tolerance Patient tolerated treatment well;Patient limited by fatigue;Patient limited by pain   Behavior During Therapy [facility] for tasks assessed/performed;Anxious      Past Medical History:  Diagnosis Date  . Anemia 2012   from labs at [facility]  . Back pain    s/p hemilaminectomy with h/o herniated disc at L4L5  . Blood clot in vein    behind left knee  . BP (high blood pressure) 03/08/2014   Last Assessment & Plan:  Blood pressure has been controlled without significant dizzyness or associated fatigue. Taking antihypertensives as directed without difficulty.    Marland Kitchen CAD (coronary artery disease) 10/18/2011  . Chronic diastolic heart failure ([place]) 09/07/2014   Last Assessment & Plan:  Edema and sob seemingly baseline   . Collagen vascular disease ([place])   . Depression    after husband died  . DVT (deep venous thrombosis) ([place]) 05/14/2012  . Essential thrombocytosis ([place])   . Hyperlipidemia   . 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[place]) 03/05/2015  . Hypotension 03/26/2013  . IBS (irritable bowel syndrome)   . Insomnia   . Lupus anticoagulant positive   . Macular degeneration   . MS (multiple sclerosis) ([place])   . Multiple sclerosis ([place]) 04/29/2008   Per neurology at [facility]    . NEUROPATHY 04/29/2008   Per Neurology at [facility]    . RLS (restless  legs syndrome)   . Spinal stenosis    lumbar  . Urinary retention    with high post void residuals 2013, per [facility]    Past Surgical History:  Procedure Laterality Date  . APPENDECTOMY    . CHOLECYSTECTOMY    . CHOLECYSTECTOMY    . LUMBAR LAMINECTOMY      There were no vitals filed for this visit.       Subjective Assessment - 07/07/16 1521    Subjective Patient wants to walk better and stand up straighter. She feels that she is getting weaker.    Patient is accompained by: Family member   Pertinent History ankiety, osteoporosis, depression, circulatioon problems, restless leg, stenosis of lumbar spine, left rotator cuff, numbness and tingling, dizziness   Limitations Walking;Standing   How long can you sit comfortably? unlimited   How long can you stand comfortably? standing is difficult and limited to 10 minutes   How long can you walk comfortably? walking is difficult and is limited to 10 minutes   Diagnostic tests MRI   Patient Stated Goals patient want to be able to stand up straighter and walk better   Currently in Pain? Yes   Pain Score 5    Pain Location Leg   Pain Orientation Left   Pain Descriptors / Indicators Aching   Pain Type Chronic pain   Pain Onset More than a  month ago   Pain Frequency Constant   Aggravating Factors  walking   Pain Relieving Factors sitting   Effect of Pain on Daily Activities difficult to perform household activties   Multiple Pain Sites Yes  back pain is constant and chronic 5/10            [facility] PT Assessment - 07/07/16 0001      Assessment   Medical Diagnosis gait instabiltiy   Referring Provider HARTSELL, FLETCHER LEE III   Onset Date/Surgical Date 07/07/16   Hand Dominance Right   Prior Therapy --  not recently     Precautions   Precautions Fall     Restrictions   Weight Bearing Restrictions No     Balance Screen   Has the patient fallen in the past 6 months Yes   How many times? 1   Has the patient had a  decrease in activity level because of a fear of falling?  Yes   Is the patient reluctant to leave their home because of a fear of falling?  No     Home Environment   Living Environment Private residence   Available Help at Discharge Family   Type of [place] to enter   Entrance Stairs-Number of Steps 1   [place] One level   [place] - standard;Cane - single point;Shower seat     Prior Function   Level of Independence Independent with basic ADLs;Independent with homemaking with ambulation;Independent with gait;Requires assistive device for independence   Vocation Retired   Leisure read and watch TV     Cognition   Overall Cognitive Status Within Functional Limits for tasks assessed   Attention Focused       PAIN: Patient has 5/10 back pain and 5/10 LLE pain  POSTURE: fwd head and flexed head   PROM/AROM: BUE shoulder ROM 100 degrees flex and abd  STRENGTH:  Graded on a 0-5 scale Muscle Group Left Right  Shoulder flex -3/5 -3/5  Shoulder Abd -3/5 -3/5  Shoulder Ext -3/5 -3/5  Shoulder IR/ER -3/5 -3/5  Elbow 4/5 4/5  Wrist/hand 4/5 4/5  Hip Flex 3/5 5/5  Hip Abd -3/5 5/5  Hip Add -3/5 5/5  Hip Ext    Hip IR/ER  5/5  Knee Flex 4/5 5/5  Knee Ext 4/5 5/5  Ankle DF 3/5 5/5  Ankle PF 3/5 5/5   SENSATION: LLE calf and foot intermittent   FUNCTIONAL MOBILITY: difficult to move supine to sidelying and unable to get to prone   BALANCE: unable to single leg stand or tandem stand   GAIT: Patient ambulates with SW and slow gait speed and poor posture  OUTCOME MEASURES: TEST Outcome Interpretation  5 times sit<>stand 21.69sec >60 yo, >15 sec indicates increased risk for falls  10 meter walk test  .40               m/s <1.0 m/s indicates increased risk for falls; limited community ambulator  Timed up and Go   34.54 sec              sec <14 sec indicates increased risk for falls  6 minute walk test   520             Feet 1000 feet is community ambulator  PT Education - July 08, 2016 1527    Education provided Yes   Education Details plan of care   Person(s) Educated Patient   Methods Explanation   Comprehension Verbalized understanding             PT Long Term Goals - 07-08-16 1602      PT LONG TERM GOAL #1   Title Patient will be independent in home exercise program to improve strength/mobility for better functional independence with ADLs.   Time 12   Period Weeks   Status New     PT LONG TERM GOAL #2   Title Patient (> 66 years old) will complete five times sit to stand test in < 15 seconds indicating an increased LE strength and improved balance.   Time 12   Period Weeks   Status New     PT LONG TERM GOAL #3   Title Patient will increase six minute walk test distance to >1000 for progression to community ambulator and improve gait ability   Time 12   Period Weeks   Status New     PT LONG TERM GOAL #4   Title Patient will reduce timed up and go to <11 seconds to reduce fall risk and demonstrate improved transfer/gait ability.   Time 12   Period Weeks   Status New               Plan - 2016-07-08 1528    Clinical Impression Statement Patient presents with frequent falls and difficulty walking. She has decreased outcome measures that indicate a falls risk. She has weakness in BLE and ambulates with RW short distances.    Rehab Potential Good   Clinical Impairments Affecting Rehab Potential back pain, left leg pain, MS, decreased strength, swelling in ankles   PT Frequency 2x / week   PT Duration 12 weeks   PT Treatment/Interventions Therapeutic activities;Gait training;Balance training;Therapeutic exercise;Neuromuscular re-education;Aquatic Therapy;Electrical Stimulation;Energy conservation   PT Next Visit Plan strengthening and balance   PT Home Exercise Plan 4 way hip YTB   Consulted and Agree with Plan of  Care Patient      Patient will benefit from skilled therapeutic intervention in order to improve the following deficits and impairments:  Abnormal gait, Decreased balance, Decreased endurance, Decreased mobility, Difficulty walking, Dizziness, Decreased strength, Pain  Visit Diagnosis: Muscle weakness (generalized)  Difficulty in walking, not elsewhere classified      G-Codes - Jul 08, 2016 1532    Functional Assessment Tool Used 5 x sit to stand, TUG, 10 MW, 6 MW    Functional Limitation Mobility: Walking and moving around   Mobility: Walking and Moving Around Current Status [phone]) At least 40 percent but less than 60 percent impaired, limited or restricted   Mobility: Walking and Moving Around Goal Status [phone]) At least 20 percent but less than 40 percent impaired, limited or restricted       Problem List Patient Active Problem List   Diagnosis Date Noted  . 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 Back sprain 06/05/2015  . Chest wall contusion 06/05/2015  . Effusion of knee 05/22/2015  . Arthritis of knee, degenerative 05/22/2015  . Contusion of shoulder 04/14/2015  . 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[place]) 03/05/2015  . Complete rotator cuff rupture of left shoulder 02/05/2015  . Infraspinatus tenosynovitis 02/05/2015  . Chronic diastolic heart failure ([place]) 09/07/2014  . Deep vein thrombosis ([place]) 07/10/2014  . HLD (hyperlipidemia) 03/08/2014  . BP (high blood pressure) 03/08/2014  . UTI (urinary  tract infection) 02/05/2014  . Cellulitis, toe 08/18/2013  . Bradycardia 03/26/2013  . Hypotension 03/26/2013  . RLS (restless legs syndrome) 08/31/2012  . CN (constipation) 07/20/2012  . Prolapse of urethra 07/20/2012  . Frequent UTI 07/20/2012  . Neurogenic dysfunction of the urinary bladder 07/20/2012  . Atrophy of vagina 07/20/2012  . DVT (deep venous thrombosis) ([place]) 05/14/2012  . Risk for falls 03/16/2012  . Lumbar canal stenosis 02/16/2012  .  Restless leg 01/20/2012  . Urinary retention 12/04/2011  . CAD (coronary artery disease) 10/18/2011  . Anemia 10/17/2011  . Diastolic dysfunction [id number]  . Tachycardia 10/01/2011  . Thrombocythemia, essential ([place]) 05/26/2011  . Edema 05/26/2011  . VITAMIN D DEFICIENCY 04/29/2010  . HYPERCHOLESTEROLEMIA 04/29/2010  . DEPRESSION, MILD 04/29/2010  . CYSTOCELE WITHOUT MENTION UTERINE PROLAPSE LAT 04/29/2010  . UNS ADVRS EFF OTH RX MEDICINAL&BIOLOGICAL SBSTNC 04/29/2010  . Multiple sclerosis ([place]) 04/29/2008  . NEUROPATHY 04/29/2008  . ARTHRITIS 04/29/2008  . FATIGUE 04/29/2008   Alanson Puls, PT, DPT [place], Minette Headland S 07/07/2016, 4:07 PM  [place] MAIN [facility] SERVICES [address] [place], [place], [address] Phone: [phone]   Fax:  [phone]  Name: Cynthia Price MRN: [id number] Date of Birth: 1946/07/24

## 2016-07-07 NOTE — Patient Instructions (Signed)
4 way hip x 15  1 x day Heel raises x 15 1 x day

## 2016-07-11 ENCOUNTER — Encounter: Payer: Self-pay | Admitting: Physical Therapy

## 2016-07-11 ENCOUNTER — Ambulatory Visit: Payer: PPO | Admitting: Physical Therapy

## 2016-07-11 DIAGNOSIS — M6281 Muscle weakness (generalized): Secondary | ICD-10-CM | POA: Diagnosis not present

## 2016-07-11 DIAGNOSIS — R262 Difficulty in walking, not elsewhere classified: Secondary | ICD-10-CM

## 2016-07-11 NOTE — Therapy (Signed)
[place] MAIN [facility] SERVICES [address] [place], [place], [address] Phone: [phone]   Fax:  [phone]  Physical Therapy Treatment  Patient Details  Name: Cynthia Price MRN: [id number] Date of Birth: 1946-10-14 Referring Provider: Reinaldo Berber III  Encounter Date: 07/11/2016      PT End of Session - 07/11/16 1443    Visit Number 2   Number of Visits 25   Date for PT Re-Evaluation 10/09/16   Authorization Type g codes   Authorization Time Period 2/10   PT Start Time 0230   PT Stop Time 0310   PT Time Calculation (min) 40 min   Equipment Utilized During Treatment Gait belt   Activity Tolerance Patient tolerated treatment well;Patient limited by fatigue;Patient limited by pain   Behavior During Therapy [facility] for tasks assessed/performed;Anxious      Past Medical History:  Diagnosis Date  . Anemia 2012   from labs at [facility]  . Back pain    s/p hemilaminectomy with h/o herniated disc at L4L5  . Blood clot in vein    behind left knee  . BP (high blood pressure) 03/08/2014   Last Assessment & Plan:  Blood pressure has been controlled without significant dizzyness or associated fatigue. Taking antihypertensives as directed without difficulty.    Marland Kitchen CAD (coronary artery disease) 10/18/2011  . Chronic diastolic heart failure ([place]) 09/07/2014   Last Assessment & Plan:  Edema and sob seemingly baseline   . Collagen vascular disease ([place])   . Depression    after husband died  . DVT (deep venous thrombosis) ([place]) 05/14/2012  . Essential thrombocytosis ([place])   . Hyperlipidemia   . 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[place]) 03/05/2015  . Hypotension 03/26/2013  . IBS (irritable bowel syndrome)   . Insomnia   . Lupus anticoagulant positive   . Macular degeneration   . MS (multiple sclerosis) ([place])   . Multiple sclerosis ([place]) 04/29/2008   Per neurology at [facility]    . NEUROPATHY 04/29/2008   Per Neurology at [facility]    . RLS (restless  legs syndrome)   . Spinal stenosis    lumbar  . Urinary retention    with high post void residuals 2013, per Mallie Mussel    Past Surgical History:  Procedure Laterality Date  . APPENDECTOMY    . CHOLECYSTECTOMY    . CHOLECYSTECTOMY    . LUMBAR LAMINECTOMY      There were no vitals filed for this visit.      Subjective Assessment - 07/11/16 1442    Subjective Patient reports that she fell down on saturday on her right hip.    Patient is accompained by: Family member   Pertinent History ankiety, osteoporosis, depression, circulatioon problems, restless leg, stenosis of lumbar spine, left rotator cuff, numbness and tingling, dizziness   Limitations Walking;Standing   How long can you sit comfortably? unlimited   How long can you stand comfortably? standing is difficult and limited to 10 minutes   How long can you walk comfortably? walking is difficult and is limited to 10 minutes   Diagnostic tests MRI   Patient Stated Goals patient want to be able to stand up straighter and walk better   Pain Score 0-No pain   Multiple Pain Sites No     Therapeutic exercise and neuromuscular training:   standing hip abd with YTB x 20  side stepping left and right in parallel bars 10 feet x 3 standing on blue foam  with cone reaching x 20 across midline step ups from floor to 6 inch stool x 20 bilateral sit to stand x 10 marching in parallel bars x 20 Standing slow marching without UE support 2 x 10; Toe taps from blue foam to step 6 inch x 10 bilateral, alternating; Patient responds well to verbal and tactile cues to correct form and technique.   Muscle fatigue but no major pain complaints.                          PT Education - 07/11/16 1443    Education provided Yes   Education Details plan of care   Person(s) Educated Patient   Methods Explanation   Comprehension Verbalized understanding             PT Long Term Goals - 07/07/16 1602      PT LONG TERM  GOAL #1   Title Patient will be independent in home exercise program to improve strength/mobility for better functional independence with ADLs.   Time 12   Period Weeks   Status New     PT LONG TERM GOAL #2   Title Patient (> 71 years old) will complete five times sit to stand test in < 15 seconds indicating an increased LE strength and improved balance.   Time 12   Period Weeks   Status New     PT LONG TERM GOAL #3   Title Patient will increase six minute walk test distance to >1000 for progression to community ambulator and improve gait ability   Time 12   Period Weeks   Status New     PT LONG TERM GOAL #4   Title Patient will reduce timed up and go to <11 seconds to reduce fall risk and demonstrate improved transfer/gait ability.   Time 12   Period Weeks   Status New               Plan - 07/11/16 1444    Clinical Impression Statement Patient has decreased LE strength and poor dynamic standing balance. She was educated about using her rollator instead of her cane due to her frequent falls.    Rehab Potential Good   Clinical Impairments Affecting Rehab Potential back pain, left leg pain, MS, decreased strength, swelling in ankles   PT Frequency 2x / week   PT Duration 12 weeks   PT Treatment/Interventions Therapeutic activities;Gait training;Balance training;Therapeutic exercise;Neuromuscular re-education;Aquatic Therapy;Electrical Stimulation;Energy conservation   PT Next Visit Plan strengthening and balance   PT Home Exercise Plan 4 way hip YTB   Consulted and Agree with Plan of Care Patient      Patient will benefit from skilled therapeutic intervention in order to improve the following deficits and impairments:  Abnormal gait, Decreased balance, Decreased endurance, Decreased mobility, Difficulty walking, Dizziness, Decreased strength, Pain  Visit Diagnosis: Muscle weakness (generalized)  Difficulty in walking, not elsewhere classified     Problem  List Patient Active Problem List   Diagnosis Date Noted  . DS (disseminated sclerosis) ([place]) 09/15/2015  . Back sprain 06/05/2015  . Chest wall contusion 06/05/2015  . Effusion of knee 05/22/2015  . Arthritis of knee, degenerative 05/22/2015  . Contusion of shoulder 04/14/2015  . 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[place]) 03/05/2015  . Complete rotator cuff rupture of left shoulder 02/05/2015  . Infraspinatus tenosynovitis 02/05/2015  . Chronic diastolic heart failure ([place]) 09/07/2014  . Deep vein thrombosis ([place]) 07/10/2014  . HLD (hyperlipidemia)  03/08/2014  . BP (high blood pressure) 03/08/2014  . UTI (urinary tract infection) 02/05/2014  . Cellulitis, toe 08/18/2013  . Bradycardia 03/26/2013  . Hypotension 03/26/2013  . RLS (restless legs syndrome) 08/31/2012  . CN (constipation) 07/20/2012  . Prolapse of urethra 07/20/2012  . Frequent UTI 07/20/2012  . Neurogenic dysfunction of the urinary bladder 07/20/2012  . Atrophy of vagina 07/20/2012  . DVT (deep venous thrombosis) ([place]) 05/14/2012  . Risk for falls 03/16/2012  . Lumbar canal stenosis 02/16/2012  . Restless leg 01/20/2012  . Urinary retention 12/04/2011  . CAD (coronary artery disease) 10/18/2011  . Anemia 10/17/2011  . Diastolic dysfunction 123456  . Tachycardia 10/01/2011  . Thrombocythemia, essential ([place]) 05/26/2011  . Edema 05/26/2011  . VITAMIN D DEFICIENCY 04/29/2010  . HYPERCHOLESTEROLEMIA 04/29/2010  . DEPRESSION, MILD 04/29/2010  . CYSTOCELE WITHOUT MENTION UTERINE PROLAPSE LAT 04/29/2010  . UNS ADVRS EFF OTH RX MEDICINAL&BIOLOGICAL SBSTNC 04/29/2010  . Multiple sclerosis ([place]) 04/29/2008  . NEUROPATHY 04/29/2008  . ARTHRITIS 04/29/2008  . FATIGUE 04/29/2008   Alanson Puls, PT, DPT Tony, Minette [place]land S 07/11/2016, 2:46 PM  [place] MAIN [facility] SERVICES [address] [place], [place], [address] Phone: [phone]   Fax:   [phone]  Name: Cynthia Price MRN: [id number] Date of Birth: Apr 03, 1946

## 2016-07-13 ENCOUNTER — Ambulatory Visit: Payer: PPO

## 2016-07-13 VITALS — BP 116/57 | HR 73

## 2016-07-13 DIAGNOSIS — M6281 Muscle weakness (generalized): Secondary | ICD-10-CM

## 2016-07-13 DIAGNOSIS — R262 Difficulty in walking, not elsewhere classified: Secondary | ICD-10-CM

## 2016-07-13 NOTE — Therapy (Signed)
[place] MAIN [facility] SERVICES [address] [place], [place], [address] Phone: [phone]   Fax:  [phone]  Physical Therapy Treatment  Patient Details  Name: Cynthia Price MRN: [id number] Date of Birth: [birth year] Referring Provider: Reinaldo Berber III  Encounter Date: 07/13/2016      PT End of Session - 07/13/16 1442    Visit Number 3   Number of Visits 25   Date for PT Re-Evaluation Oct 09, 2016   Authorization Type g codes   Authorization Time Period 2/10   PT Start Time 1435   PT Stop Time 1515   PT Time Calculation (min) 40 min   Equipment Utilized During Treatment Gait belt   Activity Tolerance Patient tolerated treatment well;Patient limited by fatigue   Behavior During Therapy [facility] for tasks assessed/performed;Anxious      Past Medical History:  Diagnosis Date  . Anemia 2012   from labs at [facility]  . Back pain    s/p hemilaminectomy with h/o herniated disc at L4L5  . Blood clot in vein    behind left knee  . BP (high blood pressure) 03/08/2014   Last Assessment & Plan:  Blood pressure has been controlled without significant dizzyness or associated fatigue. Taking antihypertensives as directed without difficulty.    Marland Kitchen CAD (coronary artery disease) 10/18/2011  . Chronic diastolic heart failure ([place]) 09/07/2014   Last Assessment & Plan:  Edema and sob seemingly baseline   . Collagen vascular disease ([place])   . Depression    after husband died  . DVT (deep venous thrombosis) ([place]) 05/14/2012  . Essential thrombocytosis ([place])   . Hyperlipidemia   . 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[place]) 03/05/2015  . Hypotension 03/26/2013  . IBS (irritable bowel syndrome)   . Insomnia   . Lupus anticoagulant positive   . Macular degeneration   . MS (multiple sclerosis) ([place])   . Multiple sclerosis ([place]) 04/29/2008   [facility]    . NEUROPATHY 04/29/2008   Per Neurology at [facility]    . RLS (restless legs syndrome)   .  Spinal stenosis    lumbar  . Urinary retention    with high post void residuals 2013, per Mallie Mussel    Past Surgical History:  Procedure Laterality Date  . APPENDECTOMY    . CHOLECYSTECTOMY    . CHOLECYSTECTOMY    . LUMBAR LAMINECTOMY      Vitals:   07/13/16 1440  BP: (!) 116/57  Pulse: 73  SpO2: 99%        Subjective Assessment - 07/13/16 1440    Subjective Pt reports she is doing well at this time. No specific questions or concerns currently. No pain reported. Pt denies any further falls since last therapy session.    Patient is accompained by: Family member   Pertinent History ankiety, osteoporosis, depression, circulatioon problems, restless leg, stenosis of lumbar spine, left rotator cuff, numbness and tingling, dizziness   Limitations Walking;Standing   How long can you sit comfortably? unlimited   How long can you stand comfortably? standing is difficult and limited to 10 minutes   How long can you walk comfortably? walking is difficult and is limited to 10 minutes   Diagnostic tests MRI   Patient Stated Goals patient want to be able to stand up straighter and walk better   Currently in Pain? No/denies       TREATMENT   Therapeutic exercise    Standing hip marches, abduction, extension bilateral  2# ankle weights x 15 each; Standing knee flexion 2# ankle weights bilateral x 10; Side stepping left and right in parallel bars with 2# ankle weights 8' x 4; Standing mini squats 2 x 10, second set with HHA from therapist, cues required for proper form/technique;  Patient responds well to verbal and tactile cues to correct form and technique.   Muscle fatigue but no major pain complaints.  Neuromuscular training: Toe taps from blue foam to step 4 inch x 10 bilateral, alternating with unilateral and then no UE support; SAEBO ball pass from varying levels alternating UE and cues for head and eye follow                         PT Education -  07/13/16 1441    Education provided Yes   Education Details HEP reinforced   Person(s) Educated Patient   Methods Explanation   Comprehension Verbalized understanding             PT Long Term Goals - 07/07/16 1602      PT LONG TERM GOAL #1   Title Patient will be independent in home exercise program to improve strength/mobility for better functional independence with ADLs.   Time 12   Period Weeks   Status New     PT LONG TERM GOAL #2   Title Patient (> 70 years old) will complete five times sit to stand test in < 15 seconds indicating an increased LE strength and improved balance.   Time 12   Period Weeks   Status New     PT LONG TERM GOAL #3   Title Patient will increase six minute walk test distance to >1000 for progression to community ambulator and improve gait ability   Time 12   Period Weeks   Status New     PT LONG TERM GOAL #4   Title Patient will reduce timed up and go to <11 seconds to reduce fall risk and demonstrate improved transfer/gait ability.   Time 12   Period Weeks   Status New               Plan - 07/13/16 1443    Clinical Impression Statement Pt demonstrates decreased LE strength, poor balance, and abnormal gait with significant kyphosis. Pt encouraged to continue HEP and follow-up as scheduled.    Rehab Potential Good   Clinical Impairments Affecting Rehab Potential back pain, left leg pain, MS, decreased strength, swelling in ankles   PT Frequency 2x / week   PT Duration 12 weeks   PT Treatment/Interventions Therapeutic activities;Gait training;Balance training;Therapeutic exercise;Neuromuscular re-education;Aquatic Therapy;Electrical Stimulation;Energy conservation   PT Next Visit Plan strengthening and balance   PT Home Exercise Plan 4 way hip YTB   Consulted and Agree with Plan of Care Patient      Patient will benefit from skilled therapeutic intervention in order to improve the following deficits and impairments:  Abnormal  gait, Decreased balance, Decreased endurance, Decreased mobility, Difficulty walking, Dizziness, Decreased strength, Pain  Visit Diagnosis: Muscle weakness (generalized)  Difficulty in walking, not elsewhere classified     Problem List Patient Active Problem List   Diagnosis Date Noted  . DS (disseminated sclerosis) ([place]) 09/15/2015  . Back sprain 06/05/2015  . Chest wall contusion 06/05/2015  . Effusion of knee 05/22/2015  . Arthritis of knee, degenerative 05/22/2015  . Contusion of shoulder 04/14/2015  . 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[place]) 03/05/2015  . Complete rotator cuff  rupture of left shoulder 02/05/2015  . Infraspinatus tenosynovitis 02/05/2015  . Chronic diastolic heart failure ([place]) 09/07/2014  . Deep vein thrombosis ([place]) 07/10/2014  . HLD (hyperlipidemia) 03/08/2014  . BP (high blood pressure) 03/08/2014  . UTI (urinary tract infection) 02/05/2014  . Cellulitis, toe 08/18/2013  . Bradycardia 03/26/2013  . Hypotension 03/26/2013  . RLS (restless legs syndrome) 08/31/2012  . CN (constipation) 07/20/2012  . Prolapse of urethra 07/20/2012  . Frequent UTI 07/20/2012  . Neurogenic dysfunction of the urinary bladder 07/20/2012  . Atrophy of vagina 07/20/2012  . DVT (deep venous thrombosis) ([place]) 05/14/2012  . Risk for falls 03/16/2012  . Lumbar canal stenosis 02/16/2012  . Restless leg 01/20/2012  . Urinary retention 12/04/2011  . CAD (coronary artery disease) 10/18/2011  . Anemia 10/17/2011  . Diastolic dysfunction 123456  . Tachycardia 10/01/2011  . Thrombocythemia, essential ([place]) 05/26/2011  . Edema 05/26/2011  . VITAMIN D DEFICIENCY 04/29/2010  . HYPERCHOLESTEROLEMIA 04/29/2010  . DEPRESSION, MILD 04/29/2010  . CYSTOCELE WITHOUT MENTION UTERINE PROLAPSE LAT 04/29/2010  . UNS ADVRS EFF OTH RX MEDICINAL&BIOLOGICAL SBSTNC 04/29/2010  . Multiple sclerosis ([place]) 04/29/2008  . NEUROPATHY 04/29/2008  . ARTHRITIS 04/29/2008  . FATIGUE 04/29/2008    Phillips Grout PT, DPT   Caelyn Route 07/13/2016, 3:52 PM  [place] MAIN [facility] SERVICES [address] [place], [place], [address] Phone: [phone]   Fax:  [phone]  Name: Tonita Cabebe MRN: [id number] Date of Birth: 25-Nov-1946

## 2016-07-18 ENCOUNTER — Ambulatory Visit: Payer: PPO | Admitting: Physical Therapy

## 2016-07-18 ENCOUNTER — Encounter: Payer: Self-pay | Admitting: Physical Therapy

## 2016-07-18 DIAGNOSIS — R262 Difficulty in walking, not elsewhere classified: Secondary | ICD-10-CM

## 2016-07-18 DIAGNOSIS — M6281 Muscle weakness (generalized): Secondary | ICD-10-CM | POA: Diagnosis not present

## 2016-07-18 NOTE — Therapy (Signed)
[place] MAIN [facility] SERVICES [address] [place], [place], [address] Phone: [phone]   Fax:  [phone]  Physical Therapy Treatment  Patient Details  Name: Cynthia Price MRN: [id number] Date of Birth: 1946/08/08 Referring Provider: Reinaldo Berber III  Encounter Date: 07/18/2016      PT End of Session - 07/18/16 1503    Visit Number 4   Number of Visits 25   Date for PT Re-Evaluation October 07, 2016   Authorization Type g codes   Authorization Time Period 4/10   PT Start Time 0230   PT Stop Time 0315   PT Time Calculation (min) 45 min   Equipment Utilized During Treatment Gait belt   Activity Tolerance Patient tolerated treatment well;Patient limited by fatigue   Behavior During Therapy [facility] for tasks assessed/performed;Anxious      Past Medical History:  Diagnosis Date  . Anemia 2012   from labs at [facility]  . Back pain    s/p hemilaminectomy with h/o herniated disc at L4L5  . Blood clot in vein    behind left knee  . BP (high blood pressure) 03/08/2014   Last Assessment & Plan:  Blood pressure has been controlled without significant dizzyness or associated fatigue. Taking antihypertensives as directed without difficulty.    Marland Kitchen CAD (coronary artery disease) 10/18/2011  . Chronic diastolic heart failure ([place]) 09/07/2014   Last Assessment & Plan:  Edema and sob seemingly baseline   . Collagen vascular disease ([place])   . Depression    after husband died  . DVT (deep venous thrombosis) ([place]) 05/14/2012  . Essential thrombocytosis ([place])   . Hyperlipidemia   . 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[place]) 03/05/2015  . Hypotension 03/26/2013  . IBS (irritable bowel syndrome)   . Insomnia   . Lupus anticoagulant positive   . Macular degeneration   . MS (multiple sclerosis) ([place])   . Multiple sclerosis ([place]) 04/29/2008   [facility]y at [facility]    . NEUROPATHY 04/29/2008   Per Neurology at [facility]    . RLS (restless legs syndrome)   .  Spinal stenosis    lumbar  . Urinary retention    with high post void residuals 2013, per Mallie Mussel    Past Surgical History:  Procedure Laterality Date  . APPENDECTOMY    . CHOLECYSTECTOMY    . CHOLECYSTECTOMY    . LUMBAR LAMINECTOMY      There were no vitals filed for this visit.      Subjective Assessment - 07/18/16 1502    Subjective Pt reports she is doing well at this time. No specific questions or concerns currently. No pain reported. Pt denies any further falls since last therapy session.    Patient is accompained by: Family member   Pertinent History ankiety, osteoporosis, depression, circulatioon problems, restless leg, stenosis of lumbar spine, left rotator cuff, numbness and tingling, dizziness   Limitations Walking;Standing   How long can you sit comfortably? unlimited   How long can you stand comfortably? standing is difficult and limited to 10 minutes   How long can you walk comfortably? walking is difficult and is limited to 10 minutes   Diagnostic tests MRI   Patient Stated Goals patient want to be able to stand up straighter and walk better   Currently in Pain? No/denies   Pain Score 0-No pain   Pain Onset More than a month ago   Multiple Pain Sites No    Therapeutic exercise;  standing hip  abd with YTB x 20  side stepping left and right in parallel bars 10 feet x 3 Four square fwd/ bwd and side to side with  Min assist step ups from floor to 6 inch stool x 20 bilateral sit to stand x 10 marching in parallel bars x 20 Rocking fwd/bwd and arm swing Leg press 90 lbs x 20 x 2 Patient needs occasional verbal cueing to improve posture and cueing to correctly perform exercises slowly, holding at end of range to increase motor firing of desired muscle to encourage fatigue.                             PT Education - 07/18/16 1503    Education provided Yes   Education Details HEP reviewed   Person(s) Educated Patient   Methods  Explanation   Comprehension Verbalized understanding             PT Long Term Goals - 07/07/16 1602      PT LONG TERM GOAL #1   Title Patient will be independent in home exercise program to improve strength/mobility for better functional independence with ADLs.   Time 12   Period Weeks   Status New     PT LONG TERM GOAL #2   Title Patient (> 44 years old) will complete five times sit to stand test in < 15 seconds indicating an increased LE strength and improved balance.   Time 12   Period Weeks   Status New     PT LONG TERM GOAL #3   Title Patient will increase six minute walk test distance to >1000 for progression to community ambulator and improve gait ability   Time 12   Period Weeks   Status New     PT LONG TERM GOAL #4   Title Patient will reduce timed up and go to <11 seconds to reduce fall risk and demonstrate improved transfer/gait ability.   Time 12   Period Weeks   Status New               Plan - 07/18/16 1504    Clinical Impression Statement Patient has trunk weakness and has difficulty standing up straight to perform exercises.   Rehab Potential Good   Clinical Impairments Affecting Rehab Potential back pain, left leg pain, MS, decreased strength, swelling in ankles   PT Frequency 2x / week   PT Duration 12 weeks   PT Treatment/Interventions Therapeutic activities;Gait training;Balance training;Therapeutic exercise;Neuromuscular re-education;Aquatic Therapy;Electrical Stimulation;Energy conservation   PT Next Visit Plan strengthening and balance   PT Home Exercise Plan 4 way hip YTB   Consulted and Agree with Plan of Care Patient      Patient will benefit from skilled therapeutic intervention in order to improve the following deficits and impairments:  Abnormal gait, Decreased balance, Decreased endurance, Decreased mobility, Difficulty walking, Dizziness, Decreased strength, Pain  Visit Diagnosis: Muscle weakness (generalized)  Difficulty in  walking, not elsewhere classified     Problem List Patient Active Problem List   Diagnosis Date Noted  . DS (disseminated sclerosis) ([place]) 09/15/2015  . Back sprain 06/05/2015  . Chest wall contusion 06/05/2015  . Effusion of knee 05/22/2015  . Arthritis of knee, degenerative 05/22/2015  . Contusion of shoulder 04/14/2015  . 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[place]) 03/05/2015  . Complete rotator cuff rupture of left shoulder 02/05/2015  . Infraspinatus tenosynovitis 02/05/2015  . Chronic diastolic heart failure ([place]) 09/07/2014  .  Deep vein thrombosis ([place]) 07/10/2014  . HLD (hyperlipidemia) 03/08/2014  . BP (high blood pressure) 03/08/2014  . UTI (urinary tract infection) 02/05/2014  . Cellulitis, toe 08/18/2013  . Bradycardia 03/26/2013  . Hypotension 03/26/2013  . RLS (restless legs syndrome) 08/31/2012  . CN (constipation) 07/20/2012  . Prolapse of urethra 07/20/2012  . Frequent UTI 07/20/2012  . Neurogenic dysfunction of the urinary bladder 07/20/2012  . Atrophy of vagina 07/20/2012  . DVT (deep venous thrombosis) ([place]) 05/14/2012  . Risk for falls 03/16/2012  . Lumbar canal stenosis 02/16/2012  . Restless leg 01/20/2012  . Urinary retention 12/04/2011  . CAD (coronary artery disease) 10/18/2011  . Anemia 10/17/2011  . Diastolic dysfunction 123456  . Tachycardia 10/01/2011  . Thrombocythemia, essential ([place]) 05/26/2011  . Edema 05/26/2011  . VITAMIN D DEFICIENCY 04/29/2010  . HYPERCHOLESTEROLEMIA 04/29/2010  . DEPRESSION, MILD 04/29/2010  . CYSTOCELE WITHOUT MENTION UTERINE PROLAPSE LAT 04/29/2010  . UNS ADVRS EFF OTH RX MEDICINAL&BIOLOGICAL SBSTNC 04/29/2010  . Multiple sclerosis ([place]) 04/29/2008  . NEUROPATHY 04/29/2008  . ARTHRITIS 04/29/2008  . FATIGUE 04/29/2008   Alanson Puls, PT, DPT [place], [place] S 07/18/2016, 3:05 PM  [place] MAIN [facility] SERVICES [address] [place], [place],  [address] Phone: [phone]   Fax:  [phone]  Name: Cynthia Price MRN: [id number] Date of Birth: 12-21-1945

## 2016-07-20 ENCOUNTER — Ambulatory Visit: Payer: PPO | Admitting: Physical Therapy

## 2016-07-20 DIAGNOSIS — M6281 Muscle weakness (generalized): Secondary | ICD-10-CM

## 2016-07-20 DIAGNOSIS — R262 Difficulty in walking, not elsewhere classified: Secondary | ICD-10-CM

## 2016-07-20 NOTE — Therapy (Signed)
[place] MAIN [facility] SERVICES [address] [place], [place], [address] Phone: [phone]   Fax:  [phone]  Physical Therapy Treatment  Patient Details  Name: Cynthia Price MRN: [id number] Date of Birth: 06-02-1946 Referring Provider: Reinaldo Berber III  Encounter Date: 07/20/2016      PT End of Session - 07/20/16 1521    Visit Number 5   Number of Visits 25   Date for PT Re-Evaluation 10/26/2016   Authorization Type g codes   Authorization Time Period 5/10   PT Start Time 0230   PT Stop Time 0315   PT Time Calculation (min) 45 min   Equipment Utilized During Treatment Gait belt   Activity Tolerance Patient tolerated treatment well;Patient limited by fatigue   Behavior During Therapy [facility] for tasks assessed/performed;Anxious      Past Medical History:  Diagnosis Date  . Anemia 2012   from labs at [facility]  . Back pain    s/p hemilaminectomy with h/o herniated disc at L4L5  . Blood clot in vein    behind left knee  . BP (high blood pressure) 03/08/2014   Last Assessment & Plan:  Blood pressure has been controlled without significant dizzyness or associated fatigue. Taking antihypertensives as directed without difficulty.    Marland Kitchen CAD (coronary artery disease) 10/18/2011  . Chronic diastolic heart failure ([place]) 09/07/2014   Last Assessment & Plan:  Edema and sob seemingly baseline   . Collagen vascular disease ([place])   . Depression    after husband died  . DVT (deep venous thrombosis) ([place]) 05/14/2012  . Essential thrombocytosis ([place])   . Hyperlipidemia   . 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[place]) 03/05/2015  . Hypotension 03/26/2013  . IBS (irritable bowel syndrome)   . Insomnia   . Lupus anticoagulant positive   . Macular degeneration   . MS (multiple sclerosis) ([place])   . Multiple sclerosis ([place]) 04/29/2008   Per neurology at [facility]    . NEUROPATHY 04/29/2008   Per Neurology at [facility]    . RLS (restless legs syndrome)   .  Spinal stenosis    lumbar  . Urinary retention    with high post void residuals 2013, per Mallie Mussel    Past Surgical History:  Procedure Laterality Date  . APPENDECTOMY    . CHOLECYSTECTOMY    . CHOLECYSTECTOMY    . LUMBAR LAMINECTOMY      There were no vitals filed for this visit.   Therapeutic exercise; standing hip abd with YTB x 20  step ups from floor to 6 inch stool x 20 bilateral sit to stand x 10 marching in parallel bars x 20  NEUROMUSCULAR RE-EDUCATION  Toe tapping 6 inch stool without UE assist Tandem gait in // bars x 4 laps  Side stepping  x 5 lengths of the parallel bars   Patient needs occasional verbal cueing to improve posture and cueing to correctly perform exercises slowly, holding at end of range to increase motor firing of desired muscle to encourage fatigue. Patient is having increased pain to left knee and low back due to being over active yesterday.                                   PT Long Term Goals - 07/07/16 1602      PT LONG TERM GOAL #1   Title Patient will be independent in home exercise  program to improve strength/mobility for better functional independence with ADLs.   Time 12   Period Weeks   Status New     PT LONG TERM GOAL #2   Title Patient (> 28 years old) will complete five times sit to stand test in < 15 seconds indicating an increased LE strength and improved balance.   Time 12   Period Weeks   Status New     PT LONG TERM GOAL #3   Title Patient will increase six minute walk test distance to >1000 for progression to community ambulator and improve gait ability   Time 12   Period Weeks   Status New     PT LONG TERM GOAL #4   Title Patient will reduce timed up and go to <11 seconds to reduce fall risk and demonstrate improved transfer/gait ability.   Time 12   Period Weeks   Status New               Plan - 07/20/16 1520    Clinical Impression Statement Patient needs occasional verbal  cueing to improve posture and cueing to correctly perform exercises slowly, holding at end of range to increase motor firing of desired muscle to encourage fatigue.   Rehab Potential Good   Clinical Impairments Affecting Rehab Potential back pain, left leg pain, MS, decreased strength, swelling in ankles   PT Frequency 2x / week   PT Duration 12 weeks   PT Treatment/Interventions Therapeutic activities;Gait training;Balance training;Therapeutic exercise;Neuromuscular re-education;Aquatic Therapy;Electrical Stimulation;Energy conservation   PT Next Visit Plan strengthening and balance   PT Home Exercise Plan 4 way hip YTB   Consulted and Agree with Plan of Care Patient      Patient will benefit from skilled therapeutic intervention in order to improve the following deficits and impairments:  Abnormal gait, Decreased balance, Decreased endurance, Decreased mobility, Difficulty walking, Dizziness, Decreased strength, Pain  Visit Diagnosis: Muscle weakness (generalized)  Difficulty in walking, not elsewhere classified     Problem List Patient Active Problem List   Diagnosis Date Noted  . DS (disseminated sclerosis) ([place]) 09/15/2015  . Back sprain 06/05/2015  . Chest wall contusion 06/05/2015  . Effusion of knee 05/22/2015  . Arthritis of knee, degenerative 05/22/2015  . Contusion of shoulder 04/14/2015  . 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[place]) 03/05/2015  . Complete rotator cuff rupture of left shoulder 02/05/2015  . Infraspinatus tenosynovitis 02/05/2015  . Chronic diastolic heart failure ([place]) 09/07/2014  . Deep vein thrombosis ([place]) 07/10/2014  . HLD (hyperlipidemia) 03/08/2014  . BP (high blood pressure) 03/08/2014  . UTI (urinary tract infection) 02/05/2014  . Cellulitis, toe 08/18/2013  . Bradycardia 03/26/2013  . Hypotension 03/26/2013  . RLS (restless legs syndrome) 08/31/2012  . CN (constipation) 07/20/2012  . Prolapse of urethra 07/20/2012  . Frequent UTI  07/20/2012  . Neurogenic dysfunction of the urinary bladder 07/20/2012  . Atrophy of vagina 07/20/2012  . DVT (deep venous thrombosis) ([place]) 05/14/2012  . Risk for falls 03/16/2012  . Lumbar canal stenosis 02/16/2012  . Restless leg 01/20/2012  . Urinary retention 12/04/2011  . CAD (coronary artery disease) 10/18/2011  . Anemia 10/17/2011  . Diastolic dysfunction 123456  . Tachycardia 10/01/2011  . Thrombocythemia, essential ([place]) 05/26/2011  . Edema 05/26/2011  . VITAMIN D DEFICIENCY 04/29/2010  . HYPERCHOLESTEROLEMIA 04/29/2010  . DEPRESSION, MILD 04/29/2010  . CYSTOCELE WITHOUT MENTION UTERINE PROLAPSE LAT 04/29/2010  . UNS ADVRS EFF OTH RX MEDICINAL&BIOLOGICAL SBSTNC 04/29/2010  . Multiple sclerosis ([place]) 04/29/2008  .  NEUROPATHY 04/29/2008  . ARTHRITIS 04/29/2008  . FATIGUE 04/29/2008   Alanson Puls, PT, DPT [place], [place] 07/20/2016, 3:22 PM  [place] MAIN [facility] SERVICES [address] [place], [place], [address] Phone: [phone]   Fax:  [phone]  Name: Cynthia Price MRN: [id number] Date of Birth: 1946-08-14

## 2016-07-21 DIAGNOSIS — G35 Multiple sclerosis: Secondary | ICD-10-CM | POA: Diagnosis not present

## 2016-07-21 DIAGNOSIS — E782 Mixed hyperlipidemia: Secondary | ICD-10-CM | POA: Diagnosis not present

## 2016-07-21 DIAGNOSIS — I10 Essential (primary) hypertension: Secondary | ICD-10-CM | POA: Diagnosis not present

## 2016-07-21 DIAGNOSIS — I5032 Chronic diastolic (congestive) heart failure: Secondary | ICD-10-CM | POA: Diagnosis not present

## 2016-07-21 DIAGNOSIS — I825Y2 Chronic embolism and thrombosis of unspecified deep veins of left proximal lower extremity: Secondary | ICD-10-CM | POA: Diagnosis not present

## 2016-07-25 ENCOUNTER — Ambulatory Visit: Payer: PPO | Admitting: Physical Therapy

## 2016-07-25 DIAGNOSIS — M25462 Effusion, left knee: Secondary | ICD-10-CM | POA: Diagnosis not present

## 2016-07-25 DIAGNOSIS — M1712 Unilateral primary osteoarthritis, left knee: Secondary | ICD-10-CM | POA: Diagnosis not present

## 2016-07-27 ENCOUNTER — Encounter: Payer: Self-pay | Admitting: Physical Therapy

## 2016-07-27 ENCOUNTER — Ambulatory Visit: Payer: PPO | Admitting: Physical Therapy

## 2016-07-27 DIAGNOSIS — M6281 Muscle weakness (generalized): Secondary | ICD-10-CM

## 2016-07-27 DIAGNOSIS — R262 Difficulty in walking, not elsewhere classified: Secondary | ICD-10-CM

## 2016-07-27 NOTE — Therapy (Signed)
[place] MAIN [facility] SERVICES [address] [place], [place], [address] Phone: [phone]   Fax:  [phone]  Physical Therapy Treatment  Patient Details  Name: Cynthia Price MRN: [id number] Date of Birth: [birth year] Referring Provider: Reinaldo Berber III  Encounter Date: 07/27/2016      PT End of Session - 07/27/16 1451    Visit Number 6   Number of Visits 25   Date for PT Re-Evaluation 2016/10/15   Authorization Type g codes   Authorization Time Period 6/10   PT Start Time 0237   PT Stop Time 0315   PT Time Calculation (min) 38 min   Equipment Utilized During Treatment Gait belt   Activity Tolerance Patient tolerated treatment well;Patient limited by fatigue   Behavior During Therapy [facility] for tasks assessed/performed;Anxious      Past Medical History:  Diagnosis Date  . Anemia 2012   from labs at [facility]  . Back pain    s/p hemilaminectomy with h/o herniated disc at L4L5  . Blood clot in vein    behind left knee  . BP (high blood pressure) 03/08/2014   Last Assessment & Plan:  Blood pressure has been controlled without significant dizzyness or associated fatigue. Taking antihypertensives as directed without difficulty.    Marland Kitchen CAD (coronary artery disease) 10/18/2011  . Chronic diastolic heart failure ([place]) 09/07/2014   Last Assessment & Plan:  Edema and sob seemingly baseline   . Collagen vascular disease ([place])   . Depression    after husband died  . DVT (deep venous thrombosis) ([place]) 05/14/2012  . Essential thrombocytosis ([place])   . Hyperlipidemia   . 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[place]) 03/05/2015  . Hypotension 03/26/2013  . IBS (irritable bowel syndrome)   . Insomnia   . Lupus anticoagulant positive   . Macular degeneration   . MS (multiple sclerosis) ([place])   . Multiple sclerosis ([place]) 04/29/2008   Per neurology at [facility]    . NEUROPATHY 04/29/2008   Per Neurology at [facility]    . RLS (restless legs syndrome)   .  Spinal stenosis    lumbar  . Urinary retention    with high post void residuals 2013, per Mallie Mussel    Past Surgical History:  Procedure Laterality Date  . APPENDECTOMY    . CHOLECYSTECTOMY    . CHOLECYSTECTOMY    . LUMBAR LAMINECTOMY      There were no vitals filed for this visit.      Subjective Assessment - 07/27/16 1449    Subjective Pt reports she is doing well at this time. No pain reported. Pt denies any further falls since last therapy session.    Patient is accompained by: Family member   Pertinent History ankiety, osteoporosis, depression, circulatioon problems, restless leg, stenosis of lumbar spine, left rotator cuff, numbness and tingling, dizziness   Limitations Walking;Standing   How long can you sit comfortably? unlimited   How long can you stand comfortably? standing is difficult and limited to 10 minutes   How long can you walk comfortably? walking is difficult and is limited to 10 minutes   Diagnostic tests MRI   Patient Stated Goals patient want to be able to stand up straighter and walk better   Currently in Pain? Yes   Pain Score 8    Pain Location Leg   Pain Descriptors / Indicators Aching   Pain Type Chronic pain   Pain Onset More than a month ago  Pain Frequency Constant      Therapeutic exercise: Standing heel raises x 20 x 2 Supine hip extension x 15 x 2 BLE Standing SLR x 10 x 2 BLE Standing hip abd/ER x 20 x 2 Seated knee flex with RTB/ marching in sitting/ LAQ x 10 x 2 Sit to stand with RW x 10 x 2. Leg press with 75 lbs x 20 x 2 CGA and Min to mod verbal cues used throughout with increased in postural sway and LOB most seen with narrow base of support and while on uneven surfaces. Continues to have balance deficits typical with diagnosis. Patient performs intermediate level exercises with pain behaviors .                          PT Education - 07/27/16 1451    Education provided Yes   Education Details HEP    Person(s) Educated Patient   Methods Explanation   Comprehension Verbalized understanding             PT Long Term Goals - 07/07/16 1602      PT LONG TERM GOAL #1   Title Patient will be independent in home exercise program to improve strength/mobility for better functional independence with ADLs.   Time 12   Period Weeks   Status New     PT LONG TERM GOAL #2   Title Patient (> 71 years old) will complete five times sit to stand test in < 15 seconds indicating an increased LE strength and improved balance.   Time 12   Period Weeks   Status New     PT LONG TERM GOAL #3   Title Patient will increase six minute walk test distance to >1000 for progression to community ambulator and improve gait ability   Time 12   Period Weeks   Status New     PT LONG TERM GOAL #4   Title Patient will reduce timed up and go to <11 seconds to reduce fall risk and demonstrate improved transfer/gait ability.   Time 12   Period Weeks   Status New               Plan - 07/27/16 1453    Clinical Impression Statement Pt demonstrated decreased endurance with sit-to-stands exhibiting fatigue towards the end of second set with increased breathing rate   Clinical Impairments Affecting Rehab Potential back pain, left leg pain, MS, decreased strength, swelling in ankles   PT Frequency 2x / week   PT Duration 12 weeks   PT Treatment/Interventions Therapeutic activities;Gait training;Balance training;Therapeutic exercise;Neuromuscular re-education;Aquatic Therapy;Electrical Stimulation;Energy conservation   PT Next Visit Plan strengthening and balance   PT Home Exercise Plan 4 way hip YTB   Consulted and Agree with Plan of Care Patient      Patient will benefit from skilled therapeutic intervention in order to improve the following deficits and impairments:  Abnormal gait, Decreased balance, Decreased endurance, Decreased mobility, Difficulty walking, Dizziness, Decreased strength, Pain  Visit  Diagnosis: Muscle weakness (generalized)  Difficulty in walking, not elsewhere classified     Problem List Patient Active Problem List   Diagnosis Date Noted  . DS (disseminated sclerosis) ([place]) 09/15/2015  . Back sprain 06/05/2015  . Chest wall contusion 06/05/2015  . Effusion of knee 05/22/2015  . Arthritis of knee, degenerative 05/22/2015  . Contusion of shoulder 04/14/2015  . 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[place]) 03/05/2015  . Complete rotator cuff rupture of left  shoulder 02/05/2015  . Infraspinatus tenosynovitis 02/05/2015  . Chronic diastolic heart failure ([place]) 09/07/2014  . Deep vein thrombosis ([place]) 07/10/2014  . HLD (hyperlipidemia) 03/08/2014  . BP (high blood pressure) 03/08/2014  . UTI (urinary tract infection) 02/05/2014  . Cellulitis, toe 08/18/2013  . Bradycardia 03/26/2013  . Hypotension 03/26/2013  . RLS (restless legs syndrome) 08/31/2012  . CN (constipation) 07/20/2012  . Prolapse of urethra 07/20/2012  . Frequent UTI 07/20/2012  . Neurogenic dysfunction of the urinary bladder 07/20/2012  . Atrophy of vagina 07/20/2012  . DVT (deep venous thrombosis) ([place]) 05/14/2012  . Risk for falls 03/16/2012  . Lumbar canal stenosis 02/16/2012  . Restless leg 01/20/2012  . Urinary retention 12/04/2011  . CAD (coronary artery disease) 10/18/2011  . Anemia 10/17/2011  . Diastolic dysfunction 123456  . Tachycardia 10/01/2011  . Thrombocythemia, essential ([place]) 05/26/2011  . Edema 05/26/2011  . VITAMIN D DEFICIENCY 04/29/2010  . HYPERCHOLESTEROLEMIA 04/29/2010  . DEPRESSION, MILD 04/29/2010  . CYSTOCELE WITHOUT MENTION UTERINE PROLAPSE LAT 04/29/2010  . UNS ADVRS EFF OTH RX MEDICINAL&BIOLOGICAL SBSTNC 04/29/2010  . Multiple sclerosis ([place]) 04/29/2008  . NEUROPATHY 04/29/2008  . ARTHRITIS 04/29/2008  . FATIGUE 04/29/2008   Alanson Puls, PT, DPT [place], [place] S 07/27/2016, 2:56 PM  [place] MAIN  [facility] SERVICES [address] [place], [place], [address] Phone: [phone]   Fax:  [phone]  Name: Cynthia Price MRN: [id number] Date of Birth: 09/05/1946

## 2016-08-02 ENCOUNTER — Ambulatory Visit: Payer: PPO | Admitting: Physical Therapy

## 2016-08-03 DIAGNOSIS — I272 Other secondary pulmonary hypertension: Secondary | ICD-10-CM | POA: Diagnosis not present

## 2016-08-03 DIAGNOSIS — R0789 Other chest pain: Secondary | ICD-10-CM | POA: Diagnosis not present

## 2016-08-03 DIAGNOSIS — E782 Mixed hyperlipidemia: Secondary | ICD-10-CM | POA: Diagnosis not present

## 2016-08-03 DIAGNOSIS — I10 Essential (primary) hypertension: Secondary | ICD-10-CM | POA: Diagnosis not present

## 2016-08-03 DIAGNOSIS — I825Y2 Chronic embolism and thrombosis of unspecified deep veins of left proximal lower extremity: Secondary | ICD-10-CM | POA: Diagnosis not present

## 2016-08-03 DIAGNOSIS — R0602 Shortness of breath: Secondary | ICD-10-CM | POA: Diagnosis not present

## 2016-08-03 DIAGNOSIS — I5032 Chronic diastolic (congestive) heart failure: Secondary | ICD-10-CM | POA: Diagnosis not present

## 2016-08-04 ENCOUNTER — Ambulatory Visit: Payer: PPO | Admitting: Physical Therapy

## 2016-08-04 DIAGNOSIS — R339 Retention of urine, unspecified: Secondary | ICD-10-CM | POA: Diagnosis not present

## 2016-08-08 ENCOUNTER — Ambulatory Visit: Payer: PPO | Admitting: Physical Therapy

## 2016-08-10 ENCOUNTER — Ambulatory Visit: Payer: PPO | Admitting: Physical Therapy

## 2016-08-10 DIAGNOSIS — M25462 Effusion, left knee: Secondary | ICD-10-CM | POA: Diagnosis not present

## 2016-08-10 DIAGNOSIS — M1712 Unilateral primary osteoarthritis, left knee: Secondary | ICD-10-CM | POA: Diagnosis not present

## 2016-08-10 DIAGNOSIS — G35 Multiple sclerosis: Secondary | ICD-10-CM | POA: Diagnosis not present

## 2016-08-15 ENCOUNTER — Encounter: Payer: PPO | Admitting: Physical Therapy

## 2016-08-15 DIAGNOSIS — R0789 Other chest pain: Secondary | ICD-10-CM | POA: Diagnosis not present

## 2016-08-17 ENCOUNTER — Encounter: Payer: PPO | Admitting: Physical Therapy

## 2016-08-19 DIAGNOSIS — M1712 Unilateral primary osteoarthritis, left knee: Secondary | ICD-10-CM | POA: Diagnosis not present

## 2016-08-19 DIAGNOSIS — M25462 Effusion, left knee: Secondary | ICD-10-CM | POA: Diagnosis not present

## 2016-08-22 ENCOUNTER — Encounter: Payer: PPO | Admitting: Physical Therapy

## 2016-08-23 DIAGNOSIS — I825Y2 Chronic embolism and thrombosis of unspecified deep veins of left proximal lower extremity: Secondary | ICD-10-CM | POA: Diagnosis not present

## 2016-08-23 DIAGNOSIS — I272 Other secondary pulmonary hypertension: Secondary | ICD-10-CM | POA: Diagnosis not present

## 2016-08-23 DIAGNOSIS — I5032 Chronic diastolic (congestive) heart failure: Secondary | ICD-10-CM | POA: Diagnosis not present

## 2016-08-23 DIAGNOSIS — G4733 Obstructive sleep apnea (adult) (pediatric): Secondary | ICD-10-CM | POA: Diagnosis not present

## 2016-08-23 DIAGNOSIS — I10 Essential (primary) hypertension: Secondary | ICD-10-CM | POA: Diagnosis not present

## 2016-08-23 DIAGNOSIS — E782 Mixed hyperlipidemia: Secondary | ICD-10-CM | POA: Diagnosis not present

## 2016-08-23 DIAGNOSIS — G35 Multiple sclerosis: Secondary | ICD-10-CM | POA: Diagnosis not present

## 2016-08-23 DIAGNOSIS — Z23 Encounter for immunization: Secondary | ICD-10-CM | POA: Diagnosis not present

## 2016-08-24 ENCOUNTER — Encounter: Payer: PPO | Admitting: Physical Therapy

## 2016-08-29 DIAGNOSIS — R0602 Shortness of breath: Secondary | ICD-10-CM | POA: Diagnosis not present

## 2016-09-07 DIAGNOSIS — K5901 Slow transit constipation: Secondary | ICD-10-CM | POA: Diagnosis not present

## 2016-09-07 DIAGNOSIS — N813 Complete uterovaginal prolapse: Secondary | ICD-10-CM | POA: Diagnosis not present

## 2016-09-09 DIAGNOSIS — G35 Multiple sclerosis: Secondary | ICD-10-CM | POA: Diagnosis not present

## 2016-09-09 DIAGNOSIS — M25462 Effusion, left knee: Secondary | ICD-10-CM | POA: Diagnosis not present

## 2016-09-09 DIAGNOSIS — M1712 Unilateral primary osteoarthritis, left knee: Secondary | ICD-10-CM | POA: Diagnosis not present

## 2016-09-15 DIAGNOSIS — R339 Retention of urine, unspecified: Secondary | ICD-10-CM | POA: Diagnosis not present

## 2016-09-16 DIAGNOSIS — M1712 Unilateral primary osteoarthritis, left knee: Secondary | ICD-10-CM | POA: Diagnosis not present

## 2016-09-23 DIAGNOSIS — M1712 Unilateral primary osteoarthritis, left knee: Secondary | ICD-10-CM | POA: Diagnosis not present

## 2016-10-03 DIAGNOSIS — G35 Multiple sclerosis: Secondary | ICD-10-CM | POA: Diagnosis not present

## 2016-10-10 DIAGNOSIS — M1712 Unilateral primary osteoarthritis, left knee: Secondary | ICD-10-CM | POA: Diagnosis not present

## 2016-10-10 DIAGNOSIS — M25462 Effusion, left knee: Secondary | ICD-10-CM | POA: Diagnosis not present

## 2016-10-10 DIAGNOSIS — G35 Multiple sclerosis: Secondary | ICD-10-CM | POA: Diagnosis not present

## 2016-10-12 DIAGNOSIS — R339 Retention of urine, unspecified: Secondary | ICD-10-CM | POA: Diagnosis not present

## 2016-10-13 DIAGNOSIS — F5104 Psychophysiologic insomnia: Secondary | ICD-10-CM | POA: Diagnosis not present

## 2016-10-13 DIAGNOSIS — G2581 Restless legs syndrome: Secondary | ICD-10-CM | POA: Diagnosis not present

## 2016-10-13 DIAGNOSIS — R2681 Unsteadiness on feet: Secondary | ICD-10-CM | POA: Diagnosis not present

## 2016-10-13 DIAGNOSIS — G35 Multiple sclerosis: Secondary | ICD-10-CM | POA: Diagnosis not present

## 2016-10-26 ENCOUNTER — Other Ambulatory Visit: Payer: Self-pay | Admitting: Surgery

## 2016-10-26 DIAGNOSIS — M25462 Effusion, left knee: Secondary | ICD-10-CM

## 2016-10-26 DIAGNOSIS — M1712 Unilateral primary osteoarthritis, left knee: Secondary | ICD-10-CM

## 2016-11-04 ENCOUNTER — Ambulatory Visit: Payer: PPO

## 2016-11-07 ENCOUNTER — Ambulatory Visit
Admission: RE | Admit: 2016-11-07 | Discharge: 2016-11-07 | Disposition: A | Discharge status: Home / Self Care | Payer: PPO | Source: Ambulatory Visit | Attending: Surgery | Admitting: Surgery

## 2016-11-07 DIAGNOSIS — M1712 Unilateral primary osteoarthritis, left knee: Secondary | ICD-10-CM

## 2016-11-07 DIAGNOSIS — M25462 Effusion, left knee: Secondary | ICD-10-CM | POA: Diagnosis not present

## 2016-11-07 DIAGNOSIS — M25562 Pain in left knee: Secondary | ICD-10-CM | POA: Diagnosis not present

## 2016-11-07 DIAGNOSIS — M659 Synovitis and tenosynovitis, unspecified: Secondary | ICD-10-CM | POA: Diagnosis not present

## 2016-11-09 DIAGNOSIS — G35 Multiple sclerosis: Secondary | ICD-10-CM | POA: Diagnosis not present

## 2016-11-09 DIAGNOSIS — M1712 Unilateral primary osteoarthritis, left knee: Secondary | ICD-10-CM | POA: Diagnosis not present

## 2016-11-09 DIAGNOSIS — M25462 Effusion, left knee: Secondary | ICD-10-CM | POA: Diagnosis not present

## 2016-11-10 DIAGNOSIS — M1712 Unilateral primary osteoarthritis, left knee: Secondary | ICD-10-CM | POA: Diagnosis not present

## 2016-11-10 DIAGNOSIS — M25462 Effusion, left knee: Secondary | ICD-10-CM | POA: Diagnosis not present

## 2016-11-10 DIAGNOSIS — R339 Retention of urine, unspecified: Secondary | ICD-10-CM | POA: Diagnosis not present

## 2016-11-11 DIAGNOSIS — M25462 Effusion, left knee: Secondary | ICD-10-CM | POA: Diagnosis not present

## 2016-11-11 DIAGNOSIS — M7581 Other shoulder lesions, right shoulder: Secondary | ICD-10-CM | POA: Diagnosis not present

## 2016-11-11 DIAGNOSIS — M1712 Unilateral primary osteoarthritis, left knee: Secondary | ICD-10-CM | POA: Diagnosis not present

## 2016-11-16 DIAGNOSIS — G35 Multiple sclerosis: Secondary | ICD-10-CM | POA: Diagnosis not present

## 2016-11-16 DIAGNOSIS — R29898 Other symptoms and signs involving the musculoskeletal system: Secondary | ICD-10-CM | POA: Diagnosis not present

## 2016-11-16 DIAGNOSIS — R251 Tremor, unspecified: Secondary | ICD-10-CM | POA: Diagnosis not present

## 2016-11-17 DIAGNOSIS — R8290 Unspecified abnormal findings in urine: Secondary | ICD-10-CM | POA: Diagnosis not present

## 2016-11-24 DIAGNOSIS — R300 Dysuria: Secondary | ICD-10-CM | POA: Diagnosis not present

## 2016-11-24 DIAGNOSIS — Z86718 Personal history of other venous thrombosis and embolism: Secondary | ICD-10-CM | POA: Diagnosis not present

## 2016-11-24 DIAGNOSIS — I5032 Chronic diastolic (congestive) heart failure: Secondary | ICD-10-CM | POA: Diagnosis not present

## 2016-11-24 DIAGNOSIS — G35 Multiple sclerosis: Secondary | ICD-10-CM | POA: Diagnosis not present

## 2016-11-24 DIAGNOSIS — D473 Essential (hemorrhagic) thrombocythemia: Secondary | ICD-10-CM | POA: Diagnosis not present

## 2016-11-24 DIAGNOSIS — Z0000 Encounter for general adult medical examination without abnormal findings: Secondary | ICD-10-CM | POA: Diagnosis not present

## 2016-11-24 DIAGNOSIS — I272 Pulmonary hypertension, unspecified: Secondary | ICD-10-CM | POA: Diagnosis not present

## 2016-11-29 DIAGNOSIS — R251 Tremor, unspecified: Secondary | ICD-10-CM | POA: Diagnosis not present

## 2016-12-08 DIAGNOSIS — R339 Retention of urine, unspecified: Secondary | ICD-10-CM | POA: Diagnosis not present

## 2016-12-10 DIAGNOSIS — G35 Multiple sclerosis: Secondary | ICD-10-CM | POA: Diagnosis not present

## 2016-12-10 DIAGNOSIS — M25462 Effusion, left knee: Secondary | ICD-10-CM | POA: Diagnosis not present

## 2016-12-10 DIAGNOSIS — M1712 Unilateral primary osteoarthritis, left knee: Secondary | ICD-10-CM | POA: Diagnosis not present

## 2016-12-13 ENCOUNTER — Ambulatory Visit: Payer: PPO | Admitting: Neurology

## 2016-12-21 DIAGNOSIS — R200 Anesthesia of skin: Secondary | ICD-10-CM | POA: Diagnosis not present

## 2016-12-21 DIAGNOSIS — R29898 Other symptoms and signs involving the musculoskeletal system: Secondary | ICD-10-CM | POA: Diagnosis not present

## 2016-12-21 DIAGNOSIS — G35 Multiple sclerosis: Secondary | ICD-10-CM | POA: Diagnosis not present

## 2016-12-21 DIAGNOSIS — R2681 Unsteadiness on feet: Secondary | ICD-10-CM | POA: Diagnosis not present

## 2016-12-21 DIAGNOSIS — M79604 Pain in right leg: Secondary | ICD-10-CM | POA: Diagnosis not present

## 2016-12-21 DIAGNOSIS — R202 Paresthesia of skin: Secondary | ICD-10-CM | POA: Diagnosis not present

## 2016-12-26 DIAGNOSIS — M5417 Radiculopathy, lumbosacral region: Secondary | ICD-10-CM | POA: Diagnosis not present

## 2016-12-29 ENCOUNTER — Ambulatory Visit: Payer: PPO | Admitting: Neurology

## 2017-01-10 DIAGNOSIS — M25462 Effusion, left knee: Secondary | ICD-10-CM | POA: Diagnosis not present

## 2017-01-10 DIAGNOSIS — M1712 Unilateral primary osteoarthritis, left knee: Secondary | ICD-10-CM | POA: Diagnosis not present

## 2017-01-10 DIAGNOSIS — G35 Multiple sclerosis: Secondary | ICD-10-CM | POA: Diagnosis not present

## 2017-01-12 DIAGNOSIS — R339 Retention of urine, unspecified: Secondary | ICD-10-CM | POA: Diagnosis not present

## 2017-01-19 DIAGNOSIS — M48061 Spinal stenosis, lumbar region without neurogenic claudication: Secondary | ICD-10-CM | POA: Diagnosis not present

## 2017-01-19 DIAGNOSIS — M5417 Radiculopathy, lumbosacral region: Secondary | ICD-10-CM | POA: Diagnosis not present

## 2017-01-20 DIAGNOSIS — N3289 Other specified disorders of bladder: Secondary | ICD-10-CM | POA: Insufficient documentation

## 2017-01-27 DIAGNOSIS — R9341 Abnormal radiologic findings on diagnostic imaging of renal pelvis, ureter, or bladder: Secondary | ICD-10-CM | POA: Diagnosis not present

## 2017-01-27 DIAGNOSIS — N3289 Other specified disorders of bladder: Secondary | ICD-10-CM | POA: Diagnosis not present

## 2017-01-30 DIAGNOSIS — R350 Frequency of micturition: Secondary | ICD-10-CM | POA: Diagnosis not present

## 2017-01-30 DIAGNOSIS — R300 Dysuria: Secondary | ICD-10-CM | POA: Diagnosis not present

## 2017-02-07 DIAGNOSIS — M1712 Unilateral primary osteoarthritis, left knee: Secondary | ICD-10-CM | POA: Diagnosis not present

## 2017-02-07 DIAGNOSIS — M25462 Effusion, left knee: Secondary | ICD-10-CM | POA: Diagnosis not present

## 2017-02-07 DIAGNOSIS — G35 Multiple sclerosis: Secondary | ICD-10-CM | POA: Diagnosis not present

## 2017-02-08 DIAGNOSIS — M75122 Complete rotator cuff tear or rupture of left shoulder, not specified as traumatic: Secondary | ICD-10-CM | POA: Diagnosis not present

## 2017-02-08 DIAGNOSIS — M1712 Unilateral primary osteoarthritis, left knee: Secondary | ICD-10-CM | POA: Diagnosis not present

## 2017-02-08 DIAGNOSIS — M25462 Effusion, left knee: Secondary | ICD-10-CM | POA: Diagnosis not present

## 2017-02-10 DIAGNOSIS — R339 Retention of urine, unspecified: Secondary | ICD-10-CM | POA: Diagnosis not present

## 2017-03-07 DIAGNOSIS — R339 Retention of urine, unspecified: Secondary | ICD-10-CM | POA: Diagnosis not present

## 2017-03-07 DIAGNOSIS — R8290 Unspecified abnormal findings in urine: Secondary | ICD-10-CM | POA: Diagnosis not present

## 2017-03-10 DIAGNOSIS — M25462 Effusion, left knee: Secondary | ICD-10-CM | POA: Diagnosis not present

## 2017-03-10 DIAGNOSIS — G35 Multiple sclerosis: Secondary | ICD-10-CM | POA: Diagnosis not present

## 2017-03-10 DIAGNOSIS — M1712 Unilateral primary osteoarthritis, left knee: Secondary | ICD-10-CM | POA: Diagnosis not present

## 2017-03-13 DIAGNOSIS — R339 Retention of urine, unspecified: Secondary | ICD-10-CM | POA: Diagnosis not present

## 2017-03-15 DIAGNOSIS — M1711 Unilateral primary osteoarthritis, right knee: Secondary | ICD-10-CM | POA: Diagnosis not present

## 2017-03-15 DIAGNOSIS — M75122 Complete rotator cuff tear or rupture of left shoulder, not specified as traumatic: Secondary | ICD-10-CM | POA: Diagnosis not present

## 2017-03-15 DIAGNOSIS — M1712 Unilateral primary osteoarthritis, left knee: Secondary | ICD-10-CM | POA: Diagnosis not present

## 2017-03-27 DIAGNOSIS — M25562 Pain in left knee: Secondary | ICD-10-CM | POA: Diagnosis not present

## 2017-03-27 DIAGNOSIS — G8929 Other chronic pain: Secondary | ICD-10-CM | POA: Diagnosis not present

## 2017-03-27 DIAGNOSIS — M1712 Unilateral primary osteoarthritis, left knee: Secondary | ICD-10-CM | POA: Diagnosis not present

## 2017-04-09 DIAGNOSIS — M25462 Effusion, left knee: Secondary | ICD-10-CM | POA: Diagnosis not present

## 2017-04-09 DIAGNOSIS — M1712 Unilateral primary osteoarthritis, left knee: Secondary | ICD-10-CM | POA: Diagnosis not present

## 2017-04-09 DIAGNOSIS — G35 Multiple sclerosis: Secondary | ICD-10-CM | POA: Diagnosis not present

## 2017-04-20 DIAGNOSIS — G4733 Obstructive sleep apnea (adult) (pediatric): Secondary | ICD-10-CM | POA: Diagnosis not present

## 2017-04-20 DIAGNOSIS — I5032 Chronic diastolic (congestive) heart failure: Secondary | ICD-10-CM | POA: Diagnosis not present

## 2017-04-20 DIAGNOSIS — N3289 Other specified disorders of bladder: Secondary | ICD-10-CM | POA: Diagnosis not present

## 2017-04-20 DIAGNOSIS — D473 Essential (hemorrhagic) thrombocythemia: Secondary | ICD-10-CM | POA: Diagnosis not present

## 2017-04-20 DIAGNOSIS — Z86718 Personal history of other venous thrombosis and embolism: Secondary | ICD-10-CM | POA: Diagnosis not present

## 2017-04-20 DIAGNOSIS — M419 Scoliosis, unspecified: Secondary | ICD-10-CM | POA: Diagnosis not present

## 2017-04-20 DIAGNOSIS — I272 Pulmonary hypertension, unspecified: Secondary | ICD-10-CM | POA: Diagnosis not present

## 2017-04-20 DIAGNOSIS — G35 Multiple sclerosis: Secondary | ICD-10-CM | POA: Diagnosis not present

## 2017-04-20 DIAGNOSIS — M1712 Unilateral primary osteoarthritis, left knee: Secondary | ICD-10-CM | POA: Diagnosis not present

## 2017-04-20 DIAGNOSIS — G2581 Restless legs syndrome: Secondary | ICD-10-CM | POA: Diagnosis not present

## 2017-04-20 DIAGNOSIS — I110 Hypertensive heart disease with heart failure: Secondary | ICD-10-CM | POA: Diagnosis not present

## 2017-04-20 DIAGNOSIS — M7989 Other specified soft tissue disorders: Secondary | ICD-10-CM | POA: Diagnosis not present

## 2017-04-20 DIAGNOSIS — R569 Unspecified convulsions: Secondary | ICD-10-CM | POA: Insufficient documentation

## 2017-04-20 DIAGNOSIS — I82432 Acute embolism and thrombosis of left popliteal vein: Secondary | ICD-10-CM | POA: Diagnosis not present

## 2017-04-20 DIAGNOSIS — G20 Parkinson's disease: Secondary | ICD-10-CM | POA: Diagnosis not present

## 2017-04-20 DIAGNOSIS — Z79899 Other long term (current) drug therapy: Secondary | ICD-10-CM | POA: Diagnosis not present

## 2017-04-20 DIAGNOSIS — Z01818 Encounter for other preprocedural examination: Secondary | ICD-10-CM | POA: Diagnosis not present

## 2017-04-20 DIAGNOSIS — N319 Neuromuscular dysfunction of bladder, unspecified: Secondary | ICD-10-CM | POA: Diagnosis not present

## 2017-04-20 DIAGNOSIS — E782 Mixed hyperlipidemia: Secondary | ICD-10-CM | POA: Diagnosis not present

## 2017-04-26 DIAGNOSIS — I10 Essential (primary) hypertension: Secondary | ICD-10-CM | POA: Diagnosis not present

## 2017-04-26 DIAGNOSIS — I5032 Chronic diastolic (congestive) heart failure: Secondary | ICD-10-CM | POA: Diagnosis not present

## 2017-04-26 DIAGNOSIS — I272 Pulmonary hypertension, unspecified: Secondary | ICD-10-CM | POA: Diagnosis not present

## 2017-04-26 DIAGNOSIS — E782 Mixed hyperlipidemia: Secondary | ICD-10-CM | POA: Diagnosis not present

## 2017-05-10 DIAGNOSIS — G35 Multiple sclerosis: Secondary | ICD-10-CM | POA: Diagnosis not present

## 2017-05-10 DIAGNOSIS — M1712 Unilateral primary osteoarthritis, left knee: Secondary | ICD-10-CM | POA: Diagnosis not present

## 2017-05-10 DIAGNOSIS — M25462 Effusion, left knee: Secondary | ICD-10-CM | POA: Diagnosis not present

## 2017-05-12 DIAGNOSIS — R339 Retention of urine, unspecified: Secondary | ICD-10-CM | POA: Diagnosis not present

## 2017-05-16 DIAGNOSIS — I825Y2 Chronic embolism and thrombosis of unspecified deep veins of left proximal lower extremity: Secondary | ICD-10-CM | POA: Diagnosis not present

## 2017-05-16 DIAGNOSIS — H539 Unspecified visual disturbance: Secondary | ICD-10-CM | POA: Diagnosis not present

## 2017-05-16 DIAGNOSIS — E782 Mixed hyperlipidemia: Secondary | ICD-10-CM | POA: Diagnosis not present

## 2017-05-16 DIAGNOSIS — I272 Pulmonary hypertension, unspecified: Secondary | ICD-10-CM | POA: Diagnosis not present

## 2017-05-16 DIAGNOSIS — I5032 Chronic diastolic (congestive) heart failure: Secondary | ICD-10-CM | POA: Diagnosis not present

## 2017-05-16 DIAGNOSIS — I10 Essential (primary) hypertension: Secondary | ICD-10-CM | POA: Diagnosis not present

## 2017-05-16 DIAGNOSIS — G35 Multiple sclerosis: Secondary | ICD-10-CM | POA: Diagnosis not present

## 2017-05-16 DIAGNOSIS — Z0000 Encounter for general adult medical examination without abnormal findings: Secondary | ICD-10-CM | POA: Diagnosis not present

## 2017-05-22 DIAGNOSIS — I272 Pulmonary hypertension, unspecified: Secondary | ICD-10-CM | POA: Diagnosis not present

## 2017-05-29 DIAGNOSIS — I5032 Chronic diastolic (congestive) heart failure: Secondary | ICD-10-CM | POA: Diagnosis not present

## 2017-05-29 DIAGNOSIS — I825Y2 Chronic embolism and thrombosis of unspecified deep veins of left proximal lower extremity: Secondary | ICD-10-CM | POA: Diagnosis not present

## 2017-05-29 DIAGNOSIS — E782 Mixed hyperlipidemia: Secondary | ICD-10-CM | POA: Diagnosis not present

## 2017-05-29 DIAGNOSIS — I272 Pulmonary hypertension, unspecified: Secondary | ICD-10-CM | POA: Diagnosis not present

## 2017-05-29 DIAGNOSIS — I10 Essential (primary) hypertension: Secondary | ICD-10-CM | POA: Diagnosis not present

## 2017-05-30 DIAGNOSIS — H353132 Nonexudative age-related macular degeneration, bilateral, intermediate dry stage: Secondary | ICD-10-CM | POA: Diagnosis not present

## 2017-06-09 DIAGNOSIS — M1712 Unilateral primary osteoarthritis, left knee: Secondary | ICD-10-CM | POA: Diagnosis not present

## 2017-06-09 DIAGNOSIS — M25462 Effusion, left knee: Secondary | ICD-10-CM | POA: Diagnosis not present

## 2017-06-09 DIAGNOSIS — G35 Multiple sclerosis: Secondary | ICD-10-CM | POA: Diagnosis not present

## 2017-06-13 DIAGNOSIS — R339 Retention of urine, unspecified: Secondary | ICD-10-CM | POA: Diagnosis not present

## 2017-06-21 DIAGNOSIS — B372 Candidiasis of skin and nail: Secondary | ICD-10-CM | POA: Diagnosis not present

## 2017-06-21 DIAGNOSIS — N813 Complete uterovaginal prolapse: Secondary | ICD-10-CM | POA: Diagnosis not present

## 2017-06-21 DIAGNOSIS — R339 Retention of urine, unspecified: Secondary | ICD-10-CM | POA: Diagnosis not present

## 2017-06-21 DIAGNOSIS — N319 Neuromuscular dysfunction of bladder, unspecified: Secondary | ICD-10-CM | POA: Diagnosis not present

## 2017-07-04 DIAGNOSIS — R339 Retention of urine, unspecified: Secondary | ICD-10-CM | POA: Diagnosis not present

## 2017-07-05 DIAGNOSIS — D699 Hemorrhagic condition, unspecified: Secondary | ICD-10-CM | POA: Diagnosis not present

## 2017-07-10 DIAGNOSIS — M1712 Unilateral primary osteoarthritis, left knee: Secondary | ICD-10-CM | POA: Diagnosis not present

## 2017-07-10 DIAGNOSIS — G35 Multiple sclerosis: Secondary | ICD-10-CM | POA: Diagnosis not present

## 2017-07-10 DIAGNOSIS — M25462 Effusion, left knee: Secondary | ICD-10-CM | POA: Diagnosis not present

## 2017-07-18 DIAGNOSIS — I2510 Atherosclerotic heart disease of native coronary artery without angina pectoris: Secondary | ICD-10-CM | POA: Diagnosis not present

## 2017-07-18 DIAGNOSIS — I82432 Acute embolism and thrombosis of left popliteal vein: Secondary | ICD-10-CM | POA: Diagnosis not present

## 2017-07-18 DIAGNOSIS — Z66 Do not resuscitate: Secondary | ICD-10-CM | POA: Diagnosis not present

## 2017-07-18 DIAGNOSIS — M25562 Pain in left knee: Secondary | ICD-10-CM | POA: Diagnosis not present

## 2017-07-18 DIAGNOSIS — R918 Other nonspecific abnormal finding of lung field: Secondary | ICD-10-CM | POA: Diagnosis not present

## 2017-07-18 DIAGNOSIS — Z79899 Other long term (current) drug therapy: Secondary | ICD-10-CM | POA: Diagnosis not present

## 2017-07-18 DIAGNOSIS — I5032 Chronic diastolic (congestive) heart failure: Secondary | ICD-10-CM | POA: Diagnosis not present

## 2017-07-18 DIAGNOSIS — G8929 Other chronic pain: Secondary | ICD-10-CM | POA: Diagnosis not present

## 2017-07-18 DIAGNOSIS — W19XXXA Unspecified fall, initial encounter: Secondary | ICD-10-CM | POA: Diagnosis not present

## 2017-07-18 DIAGNOSIS — M62838 Other muscle spasm: Secondary | ICD-10-CM | POA: Diagnosis not present

## 2017-07-18 DIAGNOSIS — M79605 Pain in left leg: Secondary | ICD-10-CM | POA: Diagnosis not present

## 2017-07-18 DIAGNOSIS — D649 Anemia, unspecified: Secondary | ICD-10-CM | POA: Diagnosis not present

## 2017-07-18 DIAGNOSIS — Z86718 Personal history of other venous thrombosis and embolism: Secondary | ICD-10-CM | POA: Diagnosis not present

## 2017-07-18 DIAGNOSIS — Z7401 Bed confinement status: Secondary | ICD-10-CM | POA: Diagnosis not present

## 2017-07-18 DIAGNOSIS — M170 Bilateral primary osteoarthritis of knee: Secondary | ICD-10-CM | POA: Diagnosis not present

## 2017-07-18 DIAGNOSIS — I82442 Acute embolism and thrombosis of left tibial vein: Secondary | ICD-10-CM | POA: Diagnosis not present

## 2017-07-18 DIAGNOSIS — D473 Essential (hemorrhagic) thrombocythemia: Secondary | ICD-10-CM | POA: Diagnosis not present

## 2017-07-18 DIAGNOSIS — Z7901 Long term (current) use of anticoagulants: Secondary | ICD-10-CM | POA: Diagnosis not present

## 2017-07-18 DIAGNOSIS — Z9181 History of falling: Secondary | ICD-10-CM | POA: Diagnosis not present

## 2017-07-18 DIAGNOSIS — R251 Tremor, unspecified: Secondary | ICD-10-CM | POA: Diagnosis not present

## 2017-07-18 DIAGNOSIS — Z9119 Patient's noncompliance with other medical treatment and regimen: Secondary | ICD-10-CM | POA: Diagnosis not present

## 2017-07-18 DIAGNOSIS — I130 Hypertensive heart and chronic kidney disease with heart failure and stage 1 through stage 4 chronic kidney disease, or unspecified chronic kidney disease: Secondary | ICD-10-CM | POA: Diagnosis not present

## 2017-07-18 DIAGNOSIS — G2581 Restless legs syndrome: Secondary | ICD-10-CM | POA: Diagnosis not present

## 2017-07-18 DIAGNOSIS — M1712 Unilateral primary osteoarthritis, left knee: Secondary | ICD-10-CM | POA: Diagnosis not present

## 2017-07-18 DIAGNOSIS — J811 Chronic pulmonary edema: Secondary | ICD-10-CM | POA: Diagnosis not present

## 2017-07-18 DIAGNOSIS — M25462 Effusion, left knee: Secondary | ICD-10-CM | POA: Diagnosis not present

## 2017-07-18 DIAGNOSIS — M7989 Other specified soft tissue disorders: Secondary | ICD-10-CM | POA: Diagnosis not present

## 2017-07-18 DIAGNOSIS — E785 Hyperlipidemia, unspecified: Secondary | ICD-10-CM | POA: Diagnosis not present

## 2017-07-18 DIAGNOSIS — G35 Multiple sclerosis: Secondary | ICD-10-CM | POA: Diagnosis not present

## 2017-07-21 DIAGNOSIS — D649 Anemia, unspecified: Secondary | ICD-10-CM | POA: Diagnosis not present

## 2017-07-21 DIAGNOSIS — I825Y2 Chronic embolism and thrombosis of unspecified deep veins of left proximal lower extremity: Secondary | ICD-10-CM | POA: Diagnosis not present

## 2017-07-21 DIAGNOSIS — M1712 Unilateral primary osteoarthritis, left knee: Secondary | ICD-10-CM | POA: Diagnosis not present

## 2017-07-21 DIAGNOSIS — M62838 Other muscle spasm: Secondary | ICD-10-CM | POA: Diagnosis not present

## 2017-07-21 DIAGNOSIS — G2581 Restless legs syndrome: Secondary | ICD-10-CM | POA: Diagnosis not present

## 2017-07-21 DIAGNOSIS — I5032 Chronic diastolic (congestive) heart failure: Secondary | ICD-10-CM | POA: Diagnosis not present

## 2017-07-21 DIAGNOSIS — R918 Other nonspecific abnormal finding of lung field: Secondary | ICD-10-CM | POA: Diagnosis not present

## 2017-07-21 DIAGNOSIS — D473 Essential (hemorrhagic) thrombocythemia: Secondary | ICD-10-CM | POA: Diagnosis not present

## 2017-07-21 DIAGNOSIS — M7989 Other specified soft tissue disorders: Secondary | ICD-10-CM | POA: Diagnosis not present

## 2017-07-21 DIAGNOSIS — I272 Pulmonary hypertension, unspecified: Secondary | ICD-10-CM | POA: Diagnosis not present

## 2017-07-21 DIAGNOSIS — G35 Multiple sclerosis: Secondary | ICD-10-CM | POA: Diagnosis not present

## 2017-07-21 DIAGNOSIS — I82432 Acute embolism and thrombosis of left popliteal vein: Secondary | ICD-10-CM | POA: Diagnosis not present

## 2017-07-21 DIAGNOSIS — Z7401 Bed confinement status: Secondary | ICD-10-CM | POA: Diagnosis not present

## 2017-07-24 DIAGNOSIS — I5032 Chronic diastolic (congestive) heart failure: Secondary | ICD-10-CM | POA: Diagnosis not present

## 2017-07-24 DIAGNOSIS — I825Y2 Chronic embolism and thrombosis of unspecified deep veins of left proximal lower extremity: Secondary | ICD-10-CM | POA: Diagnosis not present

## 2017-07-24 DIAGNOSIS — I272 Pulmonary hypertension, unspecified: Secondary | ICD-10-CM | POA: Diagnosis not present

## 2017-07-24 DIAGNOSIS — M1712 Unilateral primary osteoarthritis, left knee: Secondary | ICD-10-CM | POA: Diagnosis not present

## 2017-07-24 DIAGNOSIS — G35 Multiple sclerosis: Secondary | ICD-10-CM | POA: Diagnosis not present

## 2017-07-27 ENCOUNTER — Other Ambulatory Visit: Payer: Self-pay | Admitting: *Deleted

## 2017-07-27 NOTE — Patient Outreach (Signed)
[facility]) Care Management  07/27/2017  Edra Riccardi 1946-03-19 [id number]   Met with patient briefly, she was in PT.  Patient reports she wants to go home before her 20 days.  RNCM reviewed [facility] program, gave a brochure.  Will follow up at next facility visit.  No SW available. Business office manager reports patient should go home with home care if she will accept due to co-pays.  Plan to follow up with patient discharge next week. [name]. [name], RN, BSN, [place] [phone]) Business Cell  [phone]) Toll Free Office

## 2017-08-03 ENCOUNTER — Other Ambulatory Visit: Payer: Self-pay | Admitting: *Deleted

## 2017-08-03 DIAGNOSIS — I5022 Chronic systolic (congestive) heart failure: Secondary | ICD-10-CM

## 2017-08-03 NOTE — Patient Outreach (Signed)
[place] [facility]) Care Management  08/03/2017  Cynthia Price [birth year] [id number]   Met with patient at facility.  Patient reports she lives with her son. He is disabled and assists with IADLs. Patient reports she is doing really well with therapy, she will go home next week.  Patient has reviewed information for [facility] care management and agrees to program. Consent obtained.   Patient unsure if she will get home care services due to copay.  RNCM encouraged her to at least get the therapy services for ongoing therapy at home.   Plan to refer to [facility] care management RNCM for transition of care. [name]. [name], RN, BSN, [place] [phone]) Business Cell  [phone]) Toll Free Office

## 2017-08-11 ENCOUNTER — Encounter: Payer: Self-pay | Admitting: *Deleted

## 2017-08-11 ENCOUNTER — Other Ambulatory Visit: Payer: Self-pay | Admitting: *Deleted

## 2017-08-11 NOTE — Patient Outreach (Signed)
Successful telephone encounter to Cynthia Price, 71 year old female, follow up on referral received 08/04/17  from [place] post acute care coordinator for [facility] CM services following SNF discharge.   Pt discharged from [facility] September 12,2018, admitted to rehab August 21,2018 after recent hospitalization for acute non-occlusive DVT of left  Popliteal  Vein.  Pt has a history of but not limited to MS, Hypertension,HLD,HF, chronic anemia.  Spoke with pt, HIPAA identifiers verified, discussed purpose of call- follow up on referral/recent SNF discharge.  Pt reports she is needing some help with bedside commode, too low, was given to her by a friend 2 years ago/told new.  Discussed with pt  buttons on the side of bedside commode-used to adjust height to which pt reports no buttons.  RN CM requested to speak to son who lives with her but pt said he was busy now.  Pt reports HH PT was ordered post discharge, name on discharge papers, not within reach to view, have not contacted her yet.  RN CM discussed with pt once obtain name of Home health - call and request a new bedside commode- voiced understanding.  Pt reports on recent hospitalization for DVT to left leg- was discovered on pre op for left knee replacement, surgery on hold for now.   Pt reports currently having muscle spasms which are chronic- still able to talk to RN CM.  Pt reports fell last night getting to bedside commode, did not lock wheelchair right, no injury.   Pt reports taking all or her medications, no medication changes while  at SNF, does her own pill planner, reviewed medications verbally with pt as unable to get discharge papers at this time.  RN  CM discussed with pt [facility] transition of care program - weekly calls, a home visit, no cost, benefit of her insurance.   Plan:  As discussed with pt, plan to continue to follow for transition of care, follow up again next week- initial home visit.            As discussed, pt to call PCP office schedule  post discharge follow up appointment.            Barrier letter sent to Dr. Ouida Sills informing of [facility] involvement.    [name].   [place] Care Management  [phone]

## 2017-08-14 DIAGNOSIS — I272 Pulmonary hypertension, unspecified: Secondary | ICD-10-CM | POA: Diagnosis not present

## 2017-08-14 DIAGNOSIS — G35 Multiple sclerosis: Secondary | ICD-10-CM | POA: Diagnosis not present

## 2017-08-14 DIAGNOSIS — G8929 Other chronic pain: Secondary | ICD-10-CM | POA: Diagnosis not present

## 2017-08-14 DIAGNOSIS — I82432 Acute embolism and thrombosis of left popliteal vein: Secondary | ICD-10-CM | POA: Diagnosis not present

## 2017-08-14 DIAGNOSIS — I110 Hypertensive heart disease with heart failure: Secondary | ICD-10-CM | POA: Diagnosis not present

## 2017-08-14 DIAGNOSIS — Z9181 History of falling: Secondary | ICD-10-CM | POA: Diagnosis not present

## 2017-08-14 DIAGNOSIS — M1712 Unilateral primary osteoarthritis, left knee: Secondary | ICD-10-CM | POA: Diagnosis not present

## 2017-08-14 DIAGNOSIS — M47816 Spondylosis without myelopathy or radiculopathy, lumbar region: Secondary | ICD-10-CM | POA: Diagnosis not present

## 2017-08-14 DIAGNOSIS — D649 Anemia, unspecified: Secondary | ICD-10-CM | POA: Diagnosis not present

## 2017-08-14 DIAGNOSIS — M62838 Other muscle spasm: Secondary | ICD-10-CM | POA: Diagnosis not present

## 2017-08-14 DIAGNOSIS — G2581 Restless legs syndrome: Secondary | ICD-10-CM | POA: Diagnosis not present

## 2017-08-14 DIAGNOSIS — E785 Hyperlipidemia, unspecified: Secondary | ICD-10-CM | POA: Diagnosis not present

## 2017-08-14 DIAGNOSIS — Z7901 Long term (current) use of anticoagulants: Secondary | ICD-10-CM | POA: Diagnosis not present

## 2017-08-14 DIAGNOSIS — I5032 Chronic diastolic (congestive) heart failure: Secondary | ICD-10-CM | POA: Diagnosis not present

## 2017-08-18 ENCOUNTER — Other Ambulatory Visit: Payer: Self-pay | Admitting: *Deleted

## 2017-08-18 ENCOUNTER — Encounter: Payer: Self-pay | Admitting: *Deleted

## 2017-08-18 NOTE — Patient Outreach (Signed)
Cynthia [facility] [place]) Care Price   08/18/2017  Cynthia Price Mar 09, 1946 [id number]  Cynthia Price is an 71 y.o. female  Subjective:  Pt reports left knee bothering me,uses wheelchair due to knee  Giving out.    Pt reports Dr. Ubaldo Glassing did okay left knee surgery but need to get  Rid of blood clot first.  Pt reports HH PT did come this week, to return  next  Week, was told needed 3 additional services to which pt said could not afford The co pays.  Pt reports HH PT going to make a referral for social work- finances. Pt reports did not call PCP yet to schedule follow up appointment.     Objective:   Vitals:   08/18/17 1457  BP: 116/68  Pulse: 62  Resp: 20  SpO2: 97%    ROS  Physical Exam  Constitutional: She is oriented to person, place, and time. She appears well-developed and well-nourished.  Cardiovascular: Regular rhythm and normal heart sounds.   HR 62   Respiratory: Effort normal and breath sounds normal.  GI: Soft.  Musculoskeletal: She exhibits edema.  Edema- trace top of right foot,1-2+ left ankle/top of foot.   Neurological: She is alert and oriented to person, place, and time.  Skin: Skin is warm and dry.  Psychiatric: She has a normal mood and affect. Her behavior is normal. Judgment and thought content normal.    Encounter Medications:   Outpatient Encounter Prescriptions as of 08/18/2017  Medication Sig Note  . amantadine (SYMMETREL) 100 MG capsule Take 100 mg by mouth 2 (two) times daily.   . AMPYRA 10 MG TB12 daily. 08/11/2017: Per pt takes twice a day   . gabapentin (NEURONTIN) 600 MG tablet Take 1 tablet by mouth three or four times a day. 08/11/2017: Per pt takes 800 mg four times a day   . hydroxyurea (HYDREA) 500 MG capsule Take one by mouth Monday, Tuesday, Wed, Thursday, Friday   . potassium chloride (K-DUR) 10 MEQ tablet Take by mouth. 09/17/2015: Received from: [facility]  . rOPINIRole (REQUIP) 1 MG tablet TAKE ONE TABLET FOUR  TIMES DAILY 08/11/2017: Per pt takes 2 mg four times a day   . traMADol (ULTRAM) 50 MG tablet Take 1 tablet (50 mg total) by mouth every 6 (six) hours as needed. 08/11/2017: As needed.   . trimethoprim (TRIMPEX) 100 MG tablet Take 1 tablet (100 mg total) by mouth daily. (Patient not taking: Reported on 08/11/2017)   . XARELTO 20 MG TABS tablet  09/17/2015: Received from: External Pharmacy   No facility-administered encounter medications on file as of 08/18/2017.     Functional Status:   In your present state of health, do you have any difficulty performing the following activities: 08/18/2017  Hearing? N  Vision? N  Difficulty concentrating or making decisions? N  Walking or climbing stairs? Y  Dressing or bathing? N  Doing errands, shopping? Y  Preparing Food and eating ? Y  Using the Toilet? N  In the past six months, have you accidently leaked urine? Y  Do you have problems with loss of bowel control? N  Managing your Medications? N  Managing your Finances? N  Housekeeping or managing your Housekeeping? Y  Some recent data might be hidden    Fall/Depression Screening:    Fall Risk  08/18/2017  Falls in the past year? Yes  Number falls in past yr: 1  Injury with Fall? Yes  Follow up Falls prevention discussed  PHQ 2/9 Scores 08/18/2017  PHQ - 2 Score 2  PHQ- 9 Score 4    Assessment:  Pleasant 71 year old woman, resides with son.  Following pt for transition of care- recent  SNF discharge 08/09/17,admitted after recent hospitalization August 21-24,2018 for acute non-occlusive DVT of left popliteal vein.     Edema: trace to top of right foot,1-2+ left ankle/top of left foot.  Discussed with pt elevating legs as     Much as possible.   Fall risk: per pt left knee gives out/spasms.   Pt has heavy old wheelchair. Discussed with pt at next    PCP visit request prescription for new light wheelchair.    Plan:   Per pt's permission, called PCP office during visit- attempt to schedule  pt's follow up visit -               Phone closed due to flu vaccine.  Pt to call 08/21/17 to schedule appointment.               As discussed, plan to continue to follow pt for transition of care, follow up again next week                Telephonically.             Plan to send Dr. Ouida Sills 08/18/17 home visit encounter.    THN CM Care Plan Problem One     Most Recent Value  Care Plan Problem One  Risk for readmission related to recent Price and SNF discharges   Role Documenting the Problem One  Care [place] for Problem One  Active  THN Long Term Goal   Pt would not readmit to SNF or Price within the next 31 days   THN Long Term Goal Start Date  08/11/17  Interventions for Problem One Long Term Goal  initial home visit done- medication review done, discussed fall prevention/sit down exercises to increase mobility   THN CM Short Term Goal #1   Pt would schedule follow up visit with PCP within the next 10 days   THN CM Short Term Goal #1 Start Date  08/11/17  Interventions for Short Term Goal #1  Called PCP office during visit- phone closed due to flu vaccines/ pt agreed to call 9/24 to schedule appointment  THN CM Short Term Goal #2   Home health PT would start providing services within the next 8 days   [place] CM Short Term Goal #2 Start Date  08/11/17  [facility] CM Short Term Goal #2 Met Date  08/18/17     [name].   Cynthia Price  [phone]

## 2017-08-19 ENCOUNTER — Encounter: Payer: Self-pay | Admitting: Intensive Care

## 2017-08-19 ENCOUNTER — Observation Stay
Admission: EM | Admit: 2017-08-19 | Discharge: 2017-08-22 | Disposition: A | Discharge status: Skilled Nursing Facility | Payer: PPO | Attending: Internal Medicine | Admitting: Internal Medicine

## 2017-08-19 ENCOUNTER — Emergency Department: Payer: PPO

## 2017-08-19 DIAGNOSIS — I272 Pulmonary hypertension, unspecified: Secondary | ICD-10-CM | POA: Diagnosis not present

## 2017-08-19 DIAGNOSIS — I5032 Chronic diastolic (congestive) heart failure: Secondary | ICD-10-CM | POA: Insufficient documentation

## 2017-08-19 DIAGNOSIS — G2581 Restless legs syndrome: Secondary | ICD-10-CM | POA: Insufficient documentation

## 2017-08-19 DIAGNOSIS — Z23 Encounter for immunization: Secondary | ICD-10-CM | POA: Insufficient documentation

## 2017-08-19 DIAGNOSIS — G4700 Insomnia, unspecified: Secondary | ICD-10-CM | POA: Diagnosis not present

## 2017-08-19 DIAGNOSIS — E7800 Pure hypercholesterolemia, unspecified: Secondary | ICD-10-CM | POA: Insufficient documentation

## 2017-08-19 DIAGNOSIS — R109 Unspecified abdominal pain: Secondary | ICD-10-CM | POA: Diagnosis not present

## 2017-08-19 DIAGNOSIS — M160 Bilateral primary osteoarthritis of hip: Secondary | ICD-10-CM | POA: Diagnosis not present

## 2017-08-19 DIAGNOSIS — W010XXA Fall on same level from slipping, tripping and stumbling without subsequent striking against object, initial encounter: Secondary | ICD-10-CM | POA: Diagnosis not present

## 2017-08-19 DIAGNOSIS — M359 Systemic involvement of connective tissue, unspecified: Secondary | ICD-10-CM | POA: Diagnosis not present

## 2017-08-19 DIAGNOSIS — E785 Hyperlipidemia, unspecified: Secondary | ICD-10-CM | POA: Insufficient documentation

## 2017-08-19 DIAGNOSIS — H3530 Unspecified macular degeneration: Secondary | ICD-10-CM | POA: Insufficient documentation

## 2017-08-19 DIAGNOSIS — Y92002 Bathroom of unspecified non-institutional (private) residence single-family (private) house as the place of occurrence of the external cause: Secondary | ICD-10-CM | POA: Diagnosis not present

## 2017-08-19 DIAGNOSIS — S299XXA Unspecified injury of thorax, initial encounter: Secondary | ICD-10-CM | POA: Diagnosis not present

## 2017-08-19 DIAGNOSIS — Z86718 Personal history of other venous thrombosis and embolism: Secondary | ICD-10-CM | POA: Insufficient documentation

## 2017-08-19 DIAGNOSIS — Y998 Other external cause status: Secondary | ICD-10-CM | POA: Insufficient documentation

## 2017-08-19 DIAGNOSIS — G8929 Other chronic pain: Secondary | ICD-10-CM | POA: Diagnosis not present

## 2017-08-19 DIAGNOSIS — S7002XA Contusion of left hip, initial encounter: Secondary | ICD-10-CM | POA: Diagnosis not present

## 2017-08-19 DIAGNOSIS — M1712 Unilateral primary osteoarthritis, left knee: Secondary | ICD-10-CM | POA: Insufficient documentation

## 2017-08-19 DIAGNOSIS — R2689 Other abnormalities of gait and mobility: Secondary | ICD-10-CM | POA: Insufficient documentation

## 2017-08-19 DIAGNOSIS — Y93E8 Activity, other personal hygiene: Secondary | ICD-10-CM | POA: Diagnosis not present

## 2017-08-19 DIAGNOSIS — Z888 Allergy status to other drugs, medicaments and biological substances status: Secondary | ICD-10-CM | POA: Insufficient documentation

## 2017-08-19 DIAGNOSIS — I110 Hypertensive heart disease with heart failure: Secondary | ICD-10-CM | POA: Diagnosis not present

## 2017-08-19 DIAGNOSIS — S4992XA Unspecified injury of left shoulder and upper arm, initial encounter: Secondary | ICD-10-CM | POA: Diagnosis not present

## 2017-08-19 DIAGNOSIS — E559 Vitamin D deficiency, unspecified: Secondary | ICD-10-CM | POA: Insufficient documentation

## 2017-08-19 DIAGNOSIS — M79601 Pain in right arm: Secondary | ICD-10-CM | POA: Diagnosis not present

## 2017-08-19 DIAGNOSIS — M25512 Pain in left shoulder: Secondary | ICD-10-CM | POA: Diagnosis not present

## 2017-08-19 DIAGNOSIS — S8992XA Unspecified injury of left lower leg, initial encounter: Secondary | ICD-10-CM | POA: Diagnosis not present

## 2017-08-19 DIAGNOSIS — S20212A Contusion of left front wall of thorax, initial encounter: Secondary | ICD-10-CM | POA: Diagnosis not present

## 2017-08-19 DIAGNOSIS — M542 Cervicalgia: Secondary | ICD-10-CM | POA: Diagnosis not present

## 2017-08-19 DIAGNOSIS — J9811 Atelectasis: Secondary | ICD-10-CM | POA: Diagnosis not present

## 2017-08-19 DIAGNOSIS — M48061 Spinal stenosis, lumbar region without neurogenic claudication: Secondary | ICD-10-CM | POA: Insufficient documentation

## 2017-08-19 DIAGNOSIS — Z66 Do not resuscitate: Secondary | ICD-10-CM | POA: Insufficient documentation

## 2017-08-19 DIAGNOSIS — R200 Anesthesia of skin: Secondary | ICD-10-CM | POA: Insufficient documentation

## 2017-08-19 DIAGNOSIS — R52 Pain, unspecified: Secondary | ICD-10-CM | POA: Diagnosis not present

## 2017-08-19 DIAGNOSIS — S3991XA Unspecified injury of abdomen, initial encounter: Secondary | ICD-10-CM | POA: Diagnosis not present

## 2017-08-19 DIAGNOSIS — Z79899 Other long term (current) drug therapy: Secondary | ICD-10-CM | POA: Insufficient documentation

## 2017-08-19 DIAGNOSIS — G35 Multiple sclerosis: Secondary | ICD-10-CM | POA: Insufficient documentation

## 2017-08-19 DIAGNOSIS — I82509 Chronic embolism and thrombosis of unspecified deep veins of unspecified lower extremity: Secondary | ICD-10-CM | POA: Diagnosis not present

## 2017-08-19 DIAGNOSIS — K589 Irritable bowel syndrome without diarrhea: Secondary | ICD-10-CM | POA: Insufficient documentation

## 2017-08-19 DIAGNOSIS — Z7901 Long term (current) use of anticoagulants: Secondary | ICD-10-CM | POA: Insufficient documentation

## 2017-08-19 DIAGNOSIS — I2510 Atherosclerotic heart disease of native coronary artery without angina pectoris: Secondary | ICD-10-CM | POA: Diagnosis not present

## 2017-08-19 DIAGNOSIS — M25562 Pain in left knee: Secondary | ICD-10-CM | POA: Diagnosis not present

## 2017-08-19 DIAGNOSIS — M25552 Pain in left hip: Secondary | ICD-10-CM | POA: Diagnosis not present

## 2017-08-19 LAB — CBC WITH DIFFERENTIAL/PLATELET
BASOS ABS: 0.1 10*3/uL (ref 0–0.1)
BASOS PCT: 1 %
EOS ABS: 0.1 10*3/uL (ref 0–0.7)
Eosinophils Relative: 1 %
HCT: 36.6 % (ref 35.0–47.0)
Hemoglobin: 12.1 g/dL (ref 12.0–16.0)
Lymphocytes Relative: 15 %
Lymphs Abs: 0.9 10*3/uL — ABNORMAL LOW (ref 1.0–3.6)
MCH: 30.9 pg (ref 26.0–34.0)
MCHC: 33 g/dL (ref 32.0–36.0)
MCV: 93.7 fL (ref 80.0–100.0)
MONO ABS: 0.7 10*3/uL (ref 0.2–0.9)
MONOS PCT: 12 %
NEUTROS PCT: 71 %
Neutro Abs: 4.1 10*3/uL (ref 1.4–6.5)
Platelets: 373 10*3/uL (ref 150–440)
RBC: 3.91 MIL/uL (ref 3.80–5.20)
RDW: 15.9 % — AB (ref 11.5–14.5)
WBC: 5.8 10*3/uL (ref 3.6–11.0)

## 2017-08-19 LAB — URINALYSIS, COMPLETE (UACMP) WITH MICROSCOPIC
BILIRUBIN URINE: NEGATIVE
Bacteria, UA: NONE SEEN
GLUCOSE, UA: NEGATIVE mg/dL
HGB URINE DIPSTICK: NEGATIVE
Ketones, ur: NEGATIVE mg/dL
NITRITE: NEGATIVE
PH: 7 (ref 5.0–8.0)
Protein, ur: NEGATIVE mg/dL
SPECIFIC GRAVITY, URINE: 1.006 (ref 1.005–1.030)

## 2017-08-19 LAB — HEPATIC FUNCTION PANEL
ALT: 18 U/L (ref 14–54)
AST: 25 U/L (ref 15–41)
Albumin: 3.9 g/dL (ref 3.5–5.0)
Alkaline Phosphatase: 61 U/L (ref 38–126)
BILIRUBIN TOTAL: 0.5 mg/dL (ref 0.3–1.2)
Total Protein: 7 g/dL (ref 6.5–8.1)

## 2017-08-19 LAB — BASIC METABOLIC PANEL
ANION GAP: 9 (ref 5–15)
BUN: 24 mg/dL — ABNORMAL HIGH (ref 6–20)
CALCIUM: 9.1 mg/dL (ref 8.9–10.3)
CO2: 28 mmol/L (ref 22–32)
Chloride: 104 mmol/L (ref 101–111)
Creatinine, Ser: 0.81 mg/dL (ref 0.44–1.00)
GFR calc Af Amer: >60 mL/min (ref >60)
GFR calc non Af Amer: >60 mL/min (ref >60)
GLUCOSE: 99 mg/dL (ref 65–99)
Potassium: 4.1 mmol/L (ref 3.5–5.1)
Sodium: 141 mmol/L (ref 135–145)

## 2017-08-19 MED ORDER — BACLOFEN 10 MG PO TABS
10.0000 mg | ORAL_TABLET | Freq: Three times a day (TID) | ORAL | Status: DC | PRN
  Administered 2017-08-20 – 2017-08-22 (×4): 10 mg via ORAL
  Filled 2017-08-19 (×5): qty 1

## 2017-08-19 MED ORDER — ROPINIROLE HCL 1 MG PO TABS: 1.0000 mg | ORAL_TABLET | Freq: Three times a day (TID) | ORAL | Status: DC

## 2017-08-19 MED ORDER — HYDROXYUREA 500 MG PO CAPS
500.0000 mg | ORAL_CAPSULE | ORAL | Status: DC
  Administered 2017-08-21 – 2017-08-22 (×2): 500 mg via ORAL
  Filled 2017-08-19 (×2): qty 1

## 2017-08-19 MED ORDER — TRAMADOL HCL 50 MG PO TABS
50.0000 mg | ORAL_TABLET | Freq: Four times a day (QID) | ORAL | Status: DC | PRN
  Administered 2017-08-19 – 2017-08-22 (×4): 50 mg via ORAL
  Filled 2017-08-19 (×5): qty 1

## 2017-08-19 MED ORDER — ONDANSETRON HCL 4 MG/2ML IJ SOLN
4.0000 mg | Freq: Once | INTRAMUSCULAR | Status: AC
  Administered 2017-08-19: 4 mg via INTRAVENOUS
  Filled 2017-08-19: qty 2

## 2017-08-19 MED ORDER — TRIMETHOPRIM 100 MG PO TABS
100.0000 mg | ORAL_TABLET | Freq: Every day | ORAL | Status: DC
  Administered 2017-08-20: 100 mg via ORAL
  Filled 2017-08-19 (×3): qty 1

## 2017-08-19 MED ORDER — IOPAMIDOL (ISOVUE-300) INJECTION 61%
75.0000 mL | Freq: Once | INTRAVENOUS | Status: AC | PRN
  Administered 2017-08-19: 75 mL via INTRAVENOUS

## 2017-08-19 MED ORDER — MORPHINE SULFATE (PF) 4 MG/ML IV SOLN
4.0000 mg | Freq: Once | INTRAVENOUS | Status: AC
  Administered 2017-08-19: 4 mg via INTRAVENOUS
  Filled 2017-08-19: qty 1

## 2017-08-19 MED ORDER — TETANUS-DIPHTH-ACELL PERTUSSIS 5-2.5-18.5 LF-MCG/0.5 IM SUSP
0.5000 mL | Freq: Once | INTRAMUSCULAR | Status: AC
  Administered 2017-08-19: 0.5 mL via INTRAMUSCULAR
  Filled 2017-08-19: qty 0.5

## 2017-08-19 MED ORDER — MORPHINE SULFATE (PF) 2 MG/ML IV SOLN: 2.0000 mg | INTRAVENOUS | Status: DC | PRN

## 2017-08-19 MED ORDER — ONDANSETRON HCL 4 MG/2ML IJ SOLN
4.0000 mg | Freq: Four times a day (QID) | INTRAMUSCULAR | Status: DC | PRN
  Administered 2017-08-21: 4 mg via INTRAVENOUS
  Filled 2017-08-19 (×2): qty 2

## 2017-08-19 MED ORDER — RIVAROXABAN 20 MG PO TABS
20.0000 mg | ORAL_TABLET | Freq: Every day | ORAL | Status: DC
  Administered 2017-08-20 – 2017-08-22 (×3): 20 mg via ORAL
  Filled 2017-08-19 (×3): qty 1

## 2017-08-19 MED ORDER — GABAPENTIN 800 MG PO TABS
800.0000 mg | ORAL_TABLET | Freq: Two times a day (BID) | ORAL | Status: DC
  Filled 2017-08-19: qty 1

## 2017-08-19 MED ORDER — DALFAMPRIDINE ER 10 MG PO TB12
10.0000 mg | ORAL_TABLET | Freq: Two times a day (BID) | ORAL | Status: DC
  Administered 2017-08-20 – 2017-08-22 (×5): 10 mg via ORAL
  Filled 2017-08-19 (×5): qty 1

## 2017-08-19 MED ORDER — GABAPENTIN 400 MG PO CAPS
1600.0000 mg | ORAL_CAPSULE | Freq: Every day | ORAL | Status: DC
  Filled 2017-08-19: qty 4

## 2017-08-19 MED ORDER — KETOROLAC TROMETHAMINE 30 MG/ML IJ SOLN
10.0000 mg | Freq: Once | INTRAMUSCULAR | Status: AC
  Administered 2017-08-19: 9.9 mg via INTRAVENOUS
  Filled 2017-08-19: qty 1

## 2017-08-19 MED ORDER — INFLUENZA VAC SPLIT HIGH-DOSE 0.5 ML IM SUSY
0.5000 mL | PREFILLED_SYRINGE | INTRAMUSCULAR | Status: DC
  Filled 2017-08-19: qty 0.5

## 2017-08-19 MED ORDER — ROPINIROLE HCL 1 MG PO TABS
2.0000 mg | ORAL_TABLET | Freq: Four times a day (QID) | ORAL | Status: DC
  Administered 2017-08-19 – 2017-08-22 (×12): 2 mg via ORAL
  Filled 2017-08-19 (×12): qty 2

## 2017-08-19 MED ORDER — ONDANSETRON HCL 4 MG PO TABS: 4.0000 mg | ORAL_TABLET | Freq: Four times a day (QID) | ORAL | Status: DC | PRN

## 2017-08-19 MED ORDER — PNEUMOCOCCAL VAC POLYVALENT 25 MCG/0.5ML IJ INJ
0.5000 mL | INJECTION | INTRAMUSCULAR | Status: AC
  Administered 2017-08-21: 0.5 mL via INTRAMUSCULAR
  Filled 2017-08-19: qty 0.5

## 2017-08-19 NOTE — ED Triage Notes (Signed)
Patient was attempting to get to bathroom and fell. Patient just recently home from rehab and mostly wheelchair bound. A&O x3. HX MS

## 2017-08-19 NOTE — ED Notes (Signed)
Patient transported to X-ray 

## 2017-08-19 NOTE — ED Provider Notes (Signed)
[facility] Emergency Department Provider Note  ____________________________________________   First MD Initiated Contact with Patient 08/19/17 1628     (approximate)  I have reviewed the triage vital signs and the nursing notes.   HISTORY  Chief Complaint Fall    HPI Jacqeline Broers is a 71 y.o. female who comes to the emergency department via EMS after sustaining a mechanical fall today landing on her left side. She has a past medical history of multiple sclerosis and today has developed several episodes of profuse watery stool. She was attempting to get to the bathroom to defecate, however she did not make it and defecated on the floor and subsequently fell onto her left side. Her pain is most severe in her left shoulder her left hip and her left knee.Her pain is severe aching nonradiating worse with movement and improved with rest.   Past Medical History:  Diagnosis Date  . Anemia 2012   from labs at [facility]  . Back pain    s/p hemilaminectomy with h/o herniated disc at L4L5  . Blood clot in vein    behind left knee  . BP (high blood pressure) 03/08/2014   Last Assessment & Plan:  Blood pressure has been controlled without significant dizzyness or associated fatigue. Taking antihypertensives as directed without difficulty.    Marland Kitchen CAD (coronary artery disease) 10/18/2011  . Chronic diastolic heart failure ([place]) 09/07/2014   Last Assessment & Plan:  Edema and sob seemingly baseline   . Collagen vascular disease ([place])   . Depression    after husband died  . DVT (deep venous thrombosis) ([place]) 05/14/2012  . Essential thrombocytosis ([place])   . Hyperlipidemia   . 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[place]) 03/05/2015  . Hypotension 03/26/2013  . IBS (irritable bowel syndrome)   . Insomnia   . Lupus anticoagulant positive   . Macular degeneration   . MS (multiple sclerosis) ([place])   . Multiple sclerosis ([place]) 04/29/2008   Per neurology at [facility]    . NEUROPATHY  04/29/2008   Per Neurology at [facility]    . RLS (restless legs syndrome)   . Spinal stenosis    lumbar  . Urinary retention    with high post void residuals 2013, per [facility]    Patient Active Problem List   Diagnosis Date Noted  . Intractable pain 08/19/2017  . DS (disseminated sclerosis) ([place]) 09/15/2015  . Back sprain 06/05/2015  . Chest wall contusion 06/05/2015  . Effusion of knee 05/22/2015  . Arthritis of knee, degenerative 05/22/2015  . Contusion of shoulder 04/14/2015  . 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[place]) 03/05/2015  . Complete rotator cuff rupture of left shoulder 02/05/2015  . Infraspinatus tenosynovitis 02/05/2015  . Chronic diastolic heart failure ([place]) 09/07/2014  . Deep vein thrombosis ([place]) 07/10/2014  . HLD (hyperlipidemia) 03/08/2014  . BP (high blood pressure) 03/08/2014  . UTI (urinary tract infection) 02/05/2014  . Cellulitis, toe 08/18/2013  . Bradycardia 03/26/2013  . Hypotension 03/26/2013  . RLS (restless legs syndrome) 08/31/2012  . CN (constipation) 07/20/2012  . Prolapse of urethra 07/20/2012  . Frequent UTI 07/20/2012  . Neurogenic dysfunction of the urinary bladder 07/20/2012  . Atrophy of vagina 07/20/2012  . DVT (deep venous thrombosis) ([place]) 05/14/2012  . Risk for falls 03/16/2012  . Lumbar canal stenosis 02/16/2012  . Restless leg 01/20/2012  . Urinary retention 12/04/2011  . CAD (coronary artery disease) 10/18/2011  . Anemia 10/17/2011  . Diastolic dysfunction 83/15/1761  . Tachycardia  10/01/2011  . Thrombocythemia, essential ([place]) 05/26/2011  . Edema 05/26/2011  . VITAMIN D DEFICIENCY 04/29/2010  . HYPERCHOLESTEROLEMIA 04/29/2010  . DEPRESSION, MILD 04/29/2010  . CYSTOCELE WITHOUT MENTION UTERINE PROLAPSE LAT 04/29/2010  . UNS ADVRS EFF OTH RX MEDICINAL&BIOLOGICAL SBSTNC 04/29/2010  . Multiple sclerosis ([place]) 04/29/2008  . NEUROPATHY 04/29/2008  . ARTHRITIS 04/29/2008  . FATIGUE 04/29/2008    Past Surgical History:    Procedure Laterality Date  . APPENDECTOMY    . CHOLECYSTECTOMY    . CHOLECYSTECTOMY    . LUMBAR LAMINECTOMY      Prior to Admission medications   Medication Sig Start Date End Date Taking? Authorizing Provider  AMPYRA 10 MG TB12 Take 10 mg by mouth 2 (two) times daily.  09/11/12  Yes [provider]  baclofen (LIORESAL) 10 MG tablet Take 10 mg by mouth 3 (three) times daily as needed.    Yes [provider]  gabapentin (NEURONTIN) 800 MG tablet Take 800-1,600 mg by mouth 3 (three) times daily. Take 800 mg in the morning and afternoon. Take 1600 mg in the evening. 05/10/13  Yes Tonia Ghent, MD  hydroxyurea (HYDREA) 500 MG capsule Take one by mouth Monday, Tuesday, Wed, Thursday, Friday 05/10/13  Yes Tonia Ghent, MD  rOPINIRole (REQUIP) 2 MG tablet Take 2 mg by mouth 4 (four) times daily.   Yes [provider]  traMADol (ULTRAM) 50 MG tablet Take 1 tablet (50 mg total) by mouth every 6 (six) hours as needed. 05/10/13  Yes Tonia Ghent, MD  XARELTO 20 MG TABS tablet Take 20 mg by mouth daily with breakfast.  09/07/15  Yes [provider]  rOPINIRole (REQUIP) 1 MG tablet TAKE ONE TABLET FOUR TIMES DAILY Patient not taking: Reported on 08/19/2017 08/08/14   Tonia Ghent, MD  trimethoprim (TRIMPEX) 100 MG tablet Take 1 tablet (100 mg total) by mouth daily. Patient not taking: Reported on 08/11/2017 05/10/13   Tonia Ghent, MD    Allergies Atorvastatin  Family History  Problem Relation Age of Onset  . Heart disease Mother   . Thyroid cancer Mother   . Ovarian cancer Mother   . Alcohol abuse Father   . Heart disease Father   . Diabetes Sister   . Diabetes Brother     Social History Social History  Substance Use Topics  . Smoking status: Never Smoker  . Smokeless tobacco: Never Used  . Alcohol use No    Review of Systems Constitutional: No fever/chills Eyes: No visual changes. ENT: No sore throat. Cardiovascular: Denies chest  pain. Respiratory: Denies shortness of breath. Gastrointestinal: Positive abdominal pain.  No nausea, no vomiting.  Positive diarrhea.  No constipation. Genitourinary: Negative for dysuria. Musculoskeletal: Negative for back pain. Skin: Negative for rash. Neurological: Negative for headaches, focal weakness or numbness.   ____________________________________________   PHYSICAL EXAM:  VITAL SIGNS: ED Triage Vitals  Enc Vitals Group     BP 08/19/17 1620 (!) 175/85     Pulse Rate 08/19/17 1620 97     Resp 08/19/17 1620 (!) 27     Temp 08/19/17 1620 97.7 F (36.5 C)     Temp Source 08/19/17 1620 Oral     SpO2 08/19/17 1620 96 %     Weight 08/19/17 1617 125 lb (56.7 kg)     Height 08/19/17 1617 5\' 3"  (1.6 m)     Head Circumference --      Peak Flow --      Pain  Score 08/19/17 1616 10     Pain Loc --      Pain Edu? --      Excl. in [place]? --     Constitutional: Alert and oriented 4 chronically ill-appearing appears in some discomfort Eyes: PERRL EOMI. Head: Atraumatic. Nose: No congestion/rhinnorhea. Mouth/Throat: No trismus Neck: No stridor.   Cardiovascular: Normal rate, regular rhythm. Grossly normal heart sounds.  Good peripheral circulation. Respiratory: Normal respiratory effort.  No retractions. Lungs CTAB and moving good air Gastrointestinal: Soft nontender Musculoskeletal: Full range of motion left shoulder able to touch her right shoulder with her left hand she is somewhat uncomfortable over the humeral head. Significant discomfort with internal and external rotation of left hip  Neurologic:  Normal speech and language. No gross focal neurologic deficits are appreciated. Skin:  Skin is warm, dry and intact. No rash noted. Psychiatric: Mood and affect are normal. Speech and behavior are normal.    ____________________________________________   DIFFERENTIAL includes but not limited to  Shoulder dislocation, shoulder fracture, rib fracture, hemothorax,  intra-abdominal injury ____________________________________________   LABS (all labs ordered are listed, but only abnormal results are displayed)  Labs Reviewed  BASIC METABOLIC PANEL - Abnormal; Notable for the following:       Result Value   BUN 24 (*)    All other components within normal limits  HEPATIC FUNCTION PANEL - Abnormal; Notable for the following:    Bilirubin, Direct <0.1 (*)    All other components within normal limits  CBC WITH DIFFERENTIAL/PLATELET - Abnormal; Notable for the following:    RDW 15.9 (*)    Lymphs Abs 0.9 (*)    All other components within normal limits  URINALYSIS, COMPLETE (UACMP) WITH MICROSCOPIC - Abnormal; Notable for the following:    Color, Urine COLORLESS (*)    APPearance CLEAR (*)    Leukocytes, UA TRACE (*)    Squamous Epithelial / LPF 0-5 (*)    All other components within normal limits  CBC - Abnormal; Notable for the following:    RBC 3.44 (*)    Hemoglobin 10.4 (*)    HCT 31.9 (*)    RDW 16.2 (*)    All other components within normal limits  BASIC METABOLIC PANEL - Abnormal; Notable for the following:    BUN 23 (*)    Calcium 8.4 (*)    All other components within normal limits  LIPID PANEL  TSH    Blood work reviewed and interpreted by me is unremarkable __________________________________________  EKG  ED ECG REPORT I, Darel Hong, the attending physician, personally viewed and interpreted this ECG.  Date: 08/19/2017 EKG Time:  Rate: 92 Rhythm: normal sinus rhythm QRS Axis: normal Intervals: normal ST/T Wave abnormalities: normal Narrative Interpretation: no evidence of acute ischemia  ____________________________________________  RADIOLOGY  X-rays and CT scans and reviewed by me show no acute fractures or dislocations ____________________________________________   PROCEDURES  Procedure(s) performed: no  Procedures  Critical Care performed: no  Observation:  no ____________________________________________   INITIAL IMPRESSION / ASSESSMENT AND PLAN / ED COURSE  Pertinent labs & imaging results that were available during my care of the patient were reviewed by me and considered in my medical decision making (see chart for details).  The patient arrives clearly uncomfortable appearing. She has full range of motion of her left shoulder although with some bony tenderness. When I range her left hip she has exquisite discomfort in the left hip as well as the distal left femur. She'll  require CT scan of her abdomen pelvis to evaluate the etiology of her diarrhea as well as x-rays of the left shoulder and left hip chest and left knee. Morphine intravenously now for pain control.     ----------------------------------------- 5:44 PM on 08/19/2017 -----------------------------------------  The patient's chest x-ray is read as having a small effusion on the left which in the setting of trauma is concerning for hemothorax. I've added a CT chest onto her CT abdomen and pelvis. ____________________________________________   ----------------------------------------- 7:32 PM on 08/19/2017 -----------------------------------------  Fortunately the patient's CT scans and x-rays are negative for acute traumatic pathology. She remains in a tremendous amount of pain however and has required 3 doses of intravenous pain control. She is unable to get up on her own and unable to perform activities of daily living. At this point she requires inpatient admission for intravenous pain control as well as occupational and physical therapy evaluation.  FINAL CLINICAL IMPRESSION(S) / ED DIAGNOSES  Final diagnoses:  Contusion of rib on left side, initial encounter  Contusion of left hip, initial encounter  Intractable pain      NEW MEDICATIONS STARTED DURING THIS VISIT:  Current Discharge Medication List       Note:  This document was prepared using Dragon  voice recognition software and may include unintentional dictation errors.     Darel Hong, MD 08/20/17 1410

## 2017-08-19 NOTE — ED Notes (Signed)
Pt arrived with stool in depends. Patient cleaned and brief changed

## 2017-08-19 NOTE — ED Notes (Signed)
Pt transported to room 125 

## 2017-08-19 NOTE — H&P (Signed)
[facility] [place] NAME: Cynthia Price    MR#:  [id number]  DATE OF BIRTH:  [birth year]  DATE OF ADMISSION:  08/19/2017  PRIMARY CARE PHYSICIAN: [name], MD   REQUESTING/REFERRING PHYSICIAN: Rifenbark  CHIEF COMPLAINT:   fall HISTORY OF PRESENT ILLNESS:  Cynthia Price  is a 71 y.o. female with a known history of  MS, CHRONIC PAIN, history of DVT on Xarelto, depression, hyperlipidemia, chronic diastolic congestive heart failure is present to the ED with intractable pain after she sustained a mechanical fall. Imaging studies did not reveal any broken bones but patient was unable to get out of the bed and ambulate as she was in severe pain and hospitalist team is called to admit the patient for pain management. Patient is resting comfortably during my examination as she has received pain medicine. Her son is at bedside.  PAST MEDICAL HISTORY:   Past Medical History:  Diagnosis Date  . Anemia 2012   from labs at [facility]  . Back pain    s/p hemilaminectomy with h/o herniated disc at L4L5  . Blood clot in vein    behind left knee  . BP (high blood pressure) 03/08/2014   Last Assessment & Plan:  Blood pressure has been controlled without significant dizzyness or associated fatigue. Taking antihypertensives as directed without difficulty.    Marland Kitchen CAD (coronary artery disease) 10/18/2011  . Chronic diastolic heart failure ([place]) 09/07/2014   Last Assessment & Plan:  Edema and sob seemingly baseline   . Collagen vascular disease ([place])   . Depression    after husband died  . DVT (deep venous thrombosis) ([place]) 05/14/2012  . Essential thrombocytosis ([place])   . Hyperlipidemia   . 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[place]) 03/05/2015  . Hypotension 03/26/2013  . IBS (irritable bowel syndrome)   . Insomnia   . Lupus anticoagulant positive   . Macular degeneration   . MS (multiple sclerosis) ([place])   . Multiple sclerosis ([place]) 04/29/2008    Per neurology at [facility]    . NEUROPATHY 04/29/2008   Per Neurology at [facility]    . RLS (restless legs syndrome)   . Spinal stenosis    lumbar  . Urinary retention    with high post void residuals 2013, per [facility]    PAST SURGICAL HISTOIRY:   Past Surgical History:  Procedure Laterality Date  . APPENDECTOMY    . CHOLECYSTECTOMY    . CHOLECYSTECTOMY    . LUMBAR LAMINECTOMY      SOCIAL HISTORY:   Social History  Substance Use Topics  . Smoking status: Never Smoker  . Smokeless tobacco: Never Used  . Alcohol use No    FAMILY HISTORY:   Family History  Problem Relation Age of Onset  . Heart disease Mother   . Thyroid cancer Mother   . Ovarian cancer Mother   . Alcohol abuse Father   . Heart disease Father   . Diabetes Sister   . Diabetes Brother     DRUG ALLERGIES:   Allergies  Allergen Reactions  . Atorvastatin     Muscle aches at >40mg      REVIEW OF SYSTEMS:  CONSTITUTIONAL: No fever, fatigue or weakness.  EYES: No blurred or double vision.  EARS, NOSE, AND THROAT: No tinnitus or ear pain.  RESPIRATORY: No cough, shortness of breath, wheezing or hemoptysis.  CARDIOVASCULAR: No chest pain, orthopnea, edema.  GASTROINTESTINAL: No nausea, vomiting, diarrhea or abdominal pain.  GENITOURINARY: No dysuria, hematuria.  ENDOCRINE: No polyuria, nocturia,  HEMATOLOGY: No anemia, easy bruising or bleeding SKIN: No rash or lesion. MUSCULOSKELETAL:reporting severe left-sided pain after she sustained a fall .has chronic history of multiple sclerosis NEUROLOGIC: No tingling, numbness, weakness.  PSYCHIATRY: No anxiety or depression.   MEDICATIONS AT HOME:   Prior to Admission medications   Medication Sig Start Date End Date Taking? Authorizing Provider  AMPYRA 10 MG TB12 Take 10 mg by mouth 2 (two) times daily.  09/11/12  Yes [provider]  baclofen (LIORESAL) 10 MG tablet Take 10 mg by mouth 3 (three) times daily as needed.    Yes [provider]   gabapentin (NEURONTIN) 800 MG tablet Take 800-1,600 mg by mouth 3 (three) times daily. Take 800 mg in the morning and afternoon. Take 1600 mg in the evening. 05/10/13  Yes Tonia Ghent, MD  hydroxyurea (HYDREA) 500 MG capsule Take one by mouth Monday, Tuesday, Wed, Thursday, Friday 05/10/13  Yes Tonia Ghent, MD  rOPINIRole (REQUIP) 2 MG tablet Take 2 mg by mouth 4 (four) times daily.   Yes [provider]  traMADol (ULTRAM) 50 MG tablet Take 1 tablet (50 mg total) by mouth every 6 (six) hours as needed. 05/10/13  Yes Tonia Ghent, MD  XARELTO 20 MG TABS tablet Take 20 mg by mouth daily with breakfast.  09/07/15  Yes [provider]  rOPINIRole (REQUIP) 1 MG tablet TAKE ONE TABLET FOUR TIMES DAILY Patient not taking: Reported on 08/19/2017 08/08/14   Tonia Ghent, MD  trimethoprim (TRIMPEX) 100 MG tablet Take 1 tablet (100 mg total) by mouth daily. Patient not taking: Reported on 08/11/2017 05/10/13   Tonia Ghent, MD      VITAL SIGNS:  Blood pressure (!) 105/52, pulse 78, temperature 97.7 F (36.5 C), temperature source Oral, resp. rate 18, height 5\' 3"  (1.6 m), weight 56.7 kg (125 lb), SpO2 94 %.  PHYSICAL EXAMINATION:  GENERAL:  71 y.o.-year-old patient lying in the bed with no acute distress.  EYES: Pupils equal, round, reactive to light and accommodation. No scleral icterus. Extraocular muscles intact.  HEENT: Head atraumatic, normocephalic. Oropharynx and nasopharynx clear.  NECK:  Supple, no jugular venous distention. No thyroid enlargement, no tenderness.  LUNGS: Normal breath sounds bilaterally, no wheezing, rales,rhonchi or crepitation. No use of accessory muscles of respiration.  CARDIOVASCULAR: S1, S2 normal. No murmurs, rubs, or gallops.  ABDOMEN: Soft, nontender, nondistended. Bowel sounds present. No organomegaly or mass.  EXTREMITIES: left upper and lower extremity are tender with bruising, range of motion is limited from diffuse pain  No pedal  edema, cyanosis, or clubbing.  NEUROLOGIC: Cranial nerves II through XII are intact. Muscle strength diminished in all extremities. Sensation intact. Gait not checked.  PSYCHIATRIC: The patient is alert and oriented x 3.  SKIN: No obvious rash, lesion, or ulcer.   LABORATORY PANEL:   CBC  Recent Labs Lab 08/19/17 1709  WBC 5.8  HGB 12.1  HCT 36.6  PLT 373   ------------------------------------------------------------------------------------------------------------------  Chemistries   Recent Labs Lab 08/19/17 1709  NA 141  K 4.1  CL 104  CO2 28  GLUCOSE 99  BUN 24*  CREATININE 0.81  CALCIUM 9.1  AST 25  ALT 18  ALKPHOS 61  BILITOT 0.5   ------------------------------------------------------------------------------------------------------------------  Cardiac Enzymes No results for input(s): TROPONINI in the last 168 hours. ------------------------------------------------------------------------------------------------------------------  RADIOLOGY:  Dg Chest 1 View  Result Date: 08/19/2017 CLINICAL DATA:  Fall walking  to bathroom this afternoon with left shoulder pain. EXAM: CHEST 1 VIEW COMPARISON:  06/14/2015 and left shoulder series today. FINDINGS: Lungs are adequately inflated with mild left base opacification which may be due to atelectasis/ effusion. Mild hazy density over the left upper lung not present on the shoulder films today likely overlying soft tissues. Right lung is clear. Mild cardiomegaly. Degenerative change of the spine. No acute fracture identified. IMPRESSION: Mild hazy left base opacification which may be due to atelectasis/ effusion. Mild cardiomegaly. Electronically Signed   By: Marin Olp M.D.   On: 08/19/2017 17:34   Ct Chest W Contrast  Result Date: 08/19/2017 CLINICAL DATA:  Patient was attempting to get to bathroom and fell. Patient just recently home from rehab and mostly wheelchair bound, hx of appy and chole. EXAM: CT CHEST,  ABDOMEN, AND PELVIS WITH CONTRAST TECHNIQUE: Multidetector CT imaging of the chest, abdomen and pelvis was performed following the standard protocol during bolus administration of intravenous contrast. CONTRAST:  40mL ISOVUE-300 IOPAMIDOL (ISOVUE-300) INJECTION 61% COMPARISON:  Chest CT 06/14/2015, chest radiograph 08/19/2017 FINDINGS: CT CHEST FINDINGS Cardiovascular: Coronary artery calcification and aortic atherosclerotic calcification. Mediastinum/Nodes: No axillary supraclavicular adenopathy. No mediastinal hilar adenopathy. No pericardial fluid. Esophagus normal. Lungs/Pleura: The volume loss in the LEFT hemithorax. Mild basilar atelectasis. No pulmonary infiltrate followup pulmonary edema or pneumothorax. No suspicious nodularity. Airways normal. Musculoskeletal: No evidence of thoracic trauma. No rib fracture or clavicle fracture. No scapular fracture CT ABDOMEN AND PELVIS FINDINGS Hepatobiliary: Mild intra and extrahepatic biliary duct dilatation following cholecystectomy. Pancreas: Pancreas is normal. No ductal dilatation. No pancreatic inflammation. Spleen: No evidence splenic laceration. Adrenals/urinary tract: Adrenal glands normal. Kidneys enhance symmetrically. Bladder is intact. Stomach/Bowel: No evidence of bowel injury. Vascular/Lymphatic: Abdominal aorta normal without evidence of injury. Iliac artery is normal. 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 Pessary device in the vagina. For Other: No free fluid. Musculoskeletal: No evidence of pelvic fracture or spine fracture. Posterior lumbar fusion. IMPRESSION: Chest Impression: 1. No evidence of thoracic trauma. 2. Mild LEFT basilar atelectasis. Abdomen / Pelvis Impression: 1. No evidence of trauma in the abdomen pelvis. 2. No pelvic or spine fracture . Electronically Signed   By: Suzy Bouchard M.D.   On: 08/19/2017 19:00   Ct Abdomen Pelvis W Contrast  Result Date: 08/19/2017 CLINICAL DATA:  Patient was attempting to get to  bathroom and fell. Patient just recently home from rehab and mostly wheelchair bound, hx of appy and chole. EXAM: CT CHEST, ABDOMEN, AND PELVIS WITH CONTRAST TECHNIQUE: Multidetector CT imaging of the chest, abdomen and pelvis was performed following the standard protocol during bolus administration of intravenous contrast. CONTRAST:  48mL ISOVUE-300 IOPAMIDOL (ISOVUE-300) INJECTION 61% COMPARISON:  Chest CT 06/14/2015, chest radiograph 08/19/2017 FINDINGS: CT CHEST FINDINGS Cardiovascular: Coronary artery calcification and aortic atherosclerotic calcification. Mediastinum/Nodes: No axillary supraclavicular adenopathy. No mediastinal hilar adenopathy. No pericardial fluid. Esophagus normal. Lungs/Pleura: The volume loss in the LEFT hemithorax. Mild basilar atelectasis. No pulmonary infiltrate followup pulmonary edema or pneumothorax. No suspicious nodularity. Airways normal. Musculoskeletal: No evidence of thoracic trauma. No rib fracture or clavicle fracture. No scapular fracture CT ABDOMEN AND PELVIS FINDINGS Hepatobiliary: Mild intra and extrahepatic biliary duct dilatation following cholecystectomy. Pancreas: Pancreas is normal. No ductal dilatation. No pancreatic inflammation. Spleen: No evidence splenic laceration. Adrenals/urinary tract: Adrenal glands normal. Kidneys enhance symmetrically. Bladder is intact. Stomach/Bowel: No evidence of bowel injury. Vascular/Lymphatic: Abdominal aorta normal without evidence of injury. Iliac artery is normal. Reproductive: Uterus has a exophytic calcified  leiomyoma. Pessary device in the vagina. For Other: No free fluid. Musculoskeletal: No evidence of pelvic fracture or spine fracture. Posterior lumbar fusion. IMPRESSION: Chest Impression: 1. No evidence of thoracic trauma. 2. Mild LEFT basilar atelectasis. Abdomen / Pelvis Impression: 1. No evidence of trauma in the abdomen pelvis. 2. No pelvic or spine fracture . Electronically Signed   By: Suzy Bouchard M.D.   On:  08/19/2017 19:00   Dg Shoulder Left  Result Date: 08/19/2017 CLINICAL DATA:  71 y/o  F; status post fall with pain. EXAM: LEFT SHOULDER - 2+ VIEW COMPARISON:  None. FINDINGS: There is no evidence of fracture or dislocation. There is no evidence of arthropathy or other focal bone abnormality. Soft tissues are unremarkable. IMPRESSION: Negative. Electronically Signed   By: Kristine Garbe M.D.   On: 08/19/2017 17:28   Dg Knee Complete 4 Views Left  Result Date: 08/19/2017 CLINICAL DATA:  Fall walking to bathroom this afternoon with left knee pain. EXAM: LEFT KNEE - COMPLETE 4+ VIEW COMPARISON:  None. FINDINGS: Osteopenia. Mild degenerative change of the lateral compartment and patellofemoral joints. No acute fracture or dislocation. No significant joint effusion. IMPRESSION: No acute findings. Mild osteoarthritic change most prominent over the lateral compartment. Electronically Signed   By: Marin Olp M.D.   On: 08/19/2017 17:31   Dg Hip Unilat With Pelvis 2-3 Views Left  Result Date: 08/19/2017 CLINICAL DATA:  Fall this afternoon walking to bathroom. Left hip pain. EXAM: DG HIP (WITH OR WITHOUT PELVIS) 2-3V LEFT COMPARISON:  11/02/2014 FINDINGS: Diffuse osteopenia. Mild symmetric degenerative change of the hips. No acute fracture or dislocation. Degenerative change of the spine. Partially visualized fusion hardware over the lower lumbar spine intact. Stable calcification over the right pelvis. IMPRESSION: No acute findings. Mild symmetric degenerative change of the hips. Electronically Signed   By: Marin Olp M.D.   On: 08/19/2017 17:30    EKG:   Orders placed or performed during the hospital encounter of 08/19/17  . ED EKG  . ED EKG  . EKG 12-Lead  . EKG 12-Lead    IMPRESSION AND PLAN:   Cynthia Price  is a 71 y.o. female with a known history of  MS, CHRONIC PAIN, history of DVT on Xarelto, depression, hyperlipidemia, chronic diastolic congestive heart failure is present to  the ED with intractable pain after she sustained a fall. Imaging studies did not reveal any broken bones but patient was unable to get out of the bed and ambulate as she was in severe pain and hospitalist team is called to admit the patient for pain management  #INTRACTABLE PAIN Admit patient to MedSurg unit Pain management as tolerated. Morphine IV for severe pain as needed PT consult placed  #Chronic history of multiple sclerosis Continue home medication baclofen for muscle relaxation Continue Neurontin, tramadol Continue her home medication Ampyra   #History of hyperlipidemia Not on home medications check fasting lipid panel   #History of DVT continue Xarelto  DVT prophylaxis patient is on Xarelto  All the records are reviewed and case discussed with ED provider. Management plans discussed with the patient, family and they are in agreement.  CODE STATUS: DNR,SON IS THE HEALTHCARE POWER OF ATTORNEY  TOTAL TIME TAKING CARE OF THIS PATIENT: 43 minutes.   Note: This dictation was prepared with Dragon dictation along with smaller phrase technology. Any transcriptional errors that result from this process are unintentional.  Nicholes Mango M.D on 08/19/2017 at 8:41 PM  Between 7am to 6pm - Pager -  [phone]  After 6pm go to [email or web address] - password [email or web address] [facility]  [facility]  Office  [phone]  CC: Primary care physician; [name], MD

## 2017-08-19 NOTE — ED Notes (Signed)
Spoke with the floor - they are verifying whether this is an appropriate placement and will call back.

## 2017-08-20 ENCOUNTER — Observation Stay: Payer: PPO

## 2017-08-20 DIAGNOSIS — R52 Pain, unspecified: Secondary | ICD-10-CM | POA: Diagnosis not present

## 2017-08-20 DIAGNOSIS — G35 Multiple sclerosis: Secondary | ICD-10-CM | POA: Diagnosis not present

## 2017-08-20 DIAGNOSIS — I82509 Chronic embolism and thrombosis of unspecified deep veins of unspecified lower extremity: Secondary | ICD-10-CM | POA: Diagnosis not present

## 2017-08-20 DIAGNOSIS — S0990XA Unspecified injury of head, initial encounter: Secondary | ICD-10-CM | POA: Diagnosis not present

## 2017-08-20 DIAGNOSIS — E785 Hyperlipidemia, unspecified: Secondary | ICD-10-CM | POA: Diagnosis not present

## 2017-08-20 LAB — BASIC METABOLIC PANEL
ANION GAP: 7 (ref 5–15)
BUN: 23 mg/dL — ABNORMAL HIGH (ref 6–20)
CO2: 28 mmol/L (ref 22–32)
Calcium: 8.4 mg/dL — ABNORMAL LOW (ref 8.9–10.3)
Chloride: 106 mmol/L (ref 101–111)
Creatinine, Ser: 0.78 mg/dL (ref 0.44–1.00)
GFR calc non Af Amer: >60 mL/min (ref >60)
GLUCOSE: 96 mg/dL (ref 65–99)
POTASSIUM: 4.2 mmol/L (ref 3.5–5.1)
Sodium: 141 mmol/L (ref 135–145)

## 2017-08-20 LAB — CBC
HCT: 31.9 % — ABNORMAL LOW (ref 35.0–47.0)
Hemoglobin: 10.4 g/dL — ABNORMAL LOW (ref 12.0–16.0)
MCH: 30.3 pg (ref 26.0–34.0)
MCHC: 32.7 g/dL (ref 32.0–36.0)
MCV: 92.8 fL (ref 80.0–100.0)
Platelets: 316 10*3/uL (ref 150–440)
RBC: 3.44 MIL/uL — ABNORMAL LOW (ref 3.80–5.20)
RDW: 16.2 % — ABNORMAL HIGH (ref 11.5–14.5)
WBC: 4.5 10*3/uL (ref 3.6–11.0)

## 2017-08-20 LAB — LIPID PANEL
CHOL/HDL RATIO: 2.3 ratio
Cholesterol: 162 mg/dL (ref 0–200)
HDL: 72 mg/dL (ref >40)
LDL CALC: 80 mg/dL (ref 0–99)
TRIGLYCERIDES: 49 mg/dL (ref <150)
VLDL: 10 mg/dL (ref 0–40)

## 2017-08-20 LAB — TSH: TSH: 4.008 u[IU]/mL (ref 0.350–4.500)

## 2017-08-20 MED ORDER — GABAPENTIN 400 MG PO CAPS
800.0000 mg | ORAL_CAPSULE | Freq: Three times a day (TID) | ORAL | Status: DC
  Administered 2017-08-20 – 2017-08-21 (×3): 800 mg via ORAL
  Filled 2017-08-20 (×3): qty 2

## 2017-08-20 MED ORDER — LIDOCAINE 5 % EX PTCH
2.0000 | MEDICATED_PATCH | CUTANEOUS | Status: DC
  Administered 2017-08-20 – 2017-08-22 (×3): 2 via TRANSDERMAL
  Filled 2017-08-20 (×4): qty 2

## 2017-08-20 MED ORDER — GABAPENTIN 400 MG PO CAPS
800.0000 mg | ORAL_CAPSULE | Freq: Two times a day (BID) | ORAL | Status: DC
  Administered 2017-08-20: 09:00:00 800 mg via ORAL
  Filled 2017-08-20: qty 2

## 2017-08-20 MED ORDER — LIDOCAINE 5 % EX PTCH: 1.0000 | MEDICATED_PATCH | CUTANEOUS | Status: DC

## 2017-08-20 MED ORDER — OXYCODONE-ACETAMINOPHEN 5-325 MG PO TABS
1.0000 | ORAL_TABLET | Freq: Four times a day (QID) | ORAL | Status: DC | PRN
  Administered 2017-08-21 – 2017-08-22 (×2): 1 via ORAL
  Filled 2017-08-20 (×3): qty 1

## 2017-08-20 MED ORDER — GABAPENTIN 400 MG PO CAPS: 800.0000 mg | ORAL_CAPSULE | Freq: Three times a day (TID) | ORAL | Status: DC

## 2017-08-20 MED ORDER — GABAPENTIN 400 MG PO CAPS: 1600.0000 mg | ORAL_CAPSULE | Freq: Every day | ORAL | Status: DC

## 2017-08-20 MED ORDER — GABAPENTIN 400 MG PO CAPS
800.0000 mg | ORAL_CAPSULE | ORAL | Status: DC
  Administered 2017-08-20: 15:00:00 800 mg via ORAL
  Filled 2017-08-20: qty 2

## 2017-08-20 NOTE — Evaluation (Signed)
Physical Therapy Evaluation Patient Details Name: Tyara Dassow MRN: [id number] DOB: 13-Nov-1946 Today's Date: 08/20/2017   History of Present Illness  71 yo female with onset of fall in SNF and basilar atelectasis noted upon admission.  Has been in severe pain with no fracture findings.  PMHx:  MS, chronic pain, DVT, CHF, falls, back pain, PN, RLS, spinal stenosis, pulm vascular disease, HTN, OA  Clinical Impression  Pt is up to side of bed with max assist of PT and by the end of session she was demonstrating very low O2 sats 84%.  Replaced O 2 via nasal cannula and nursing aware but did not turn it off on pt.  Will follow up with pt acutely to increase her activity tolerance and progress her to standing as she is able.    Follow Up Recommendations SNF    Equipment Recommendations  None recommended by PT    Recommendations for Other Services       Precautions / Restrictions Precautions Precautions: Fall Precaution Comments: pain sensitivity on LLE lower leg Restrictions Weight Bearing Restrictions: No Other Position/Activity Restrictions: back pain with movement      Mobility  Bed Mobility Overal bed mobility: Needs Assistance Bed Mobility: Supine to Sit;Sit to Supine     Supine to sit: Mod assist;Max assist Sit to supine: Total assist   General bed mobility comments: pt was sick on her stomach and did not help to return to bed, used trendelenburg feature to scoot up but had low O2 sats by then  Transfers                 General transfer comment: unable to get to chair d/t nausea  Ambulation/Gait             General Gait Details: deferred  Stairs            Wheelchair Mobility    Modified Rankin (Stroke Patients Only)       Balance Overall balance assessment: Needs assistance;History of Falls Sitting-balance support: Feet supported;Bilateral upper extremity supported (assistance with trunk) Sitting balance-Leahy Scale: Poor                                        Pertinent Vitals/Pain Pain Assessment: Faces Faces Pain Scale: Hurts whole lot Pain Location: LLE and back Pain Descriptors / Indicators: Sore;Cramping Pain Intervention(s): Monitored during session;Premedicated before session;Repositioned;Limited activity within patient's tolerance    Home Living Family/patient expects to be discharged to:: Private residence Living Arrangements: Children Available Help at Discharge: Family;Available 24 hours/day           Home Equipment: Walker - 2 wheels      Prior Function Level of Independence: Independent with assistive device(s)         Comments: reports she was able to get away without the RW until recently     Hand Dominance        Extremity/Trunk Assessment   Upper Extremity Assessment Upper Extremity Assessment: Generalized weakness    Lower Extremity Assessment Lower Extremity Assessment: Generalized weakness    Cervical / Trunk Assessment Cervical / Trunk Assessment: Kyphotic  Communication   Communication: Expressive difficulties (speech very low and sometimes difficult to understand)  Cognition Arousal/Alertness: Awake/alert Behavior During Therapy: Flat affect Overall Cognitive Status: No family/caregiver present to determine baseline cognitive functioning  General Comments: Pt is slow to respond verbally and may be giving inaccurate history      General Comments General comments (skin integrity, edema, etc.): Pt was not on O2 and called nursing when PT arrived.  Was determined that respiratory therapist may have turned off and left off during PT session with nsg ok.  Had 89% sat to start in bed and by end of session was 84% on room air.  Retuned cannula to pt and per nsg back to 2L O2, nsg to assess and replace purwick.    Exercises     Assessment/Plan    PT Assessment Patient needs continued PT services  PT Problem List  Decreased strength;Decreased range of motion;Decreased activity tolerance;Decreased balance;Decreased mobility;Decreased coordination;Decreased safety awareness;Impaired sensation;Cardiopulmonary status limiting activity;Decreased skin integrity;Pain;Other (comment) (has B hand numbness)       PT Treatment Interventions DME instruction;Gait training;Functional mobility training;Therapeutic activities;Therapeutic exercise;Balance training;Neuromuscular re-education;Patient/family education    PT Goals (Current goals can be found in the Care Plan section)  Acute Rehab PT Goals Patient Stated Goal: to help try to stand PT Goal Formulation: With patient Time For Goal Achievement: 09/10/17 Potential to Achieve Goals: Good    Frequency Min 2X/week   Barriers to discharge Other (comment) (no family to determine her assistance available)      Co-evaluation               AM-PAC PT "6 Clicks" Daily Activity  Outcome Measure Difficulty turning over in bed (including adjusting bedclothes, sheets and blankets)?: Unable Difficulty moving from lying on back to sitting on the side of the bed? : Unable Difficulty sitting down on and standing up from a chair with arms (e.g., wheelchair, bedside commode, etc,.)?: Unable Help needed moving to and from a bed to chair (including a wheelchair)?: Total Help needed walking in hospital room?: Total Help needed climbing 3-5 steps with a railing? : Total 6 Click Score: 6    End of Session Equipment Utilized During Treatment: Oxygen Activity Tolerance: Other (comment) (nauseated and felt she might vomit during sitting bedside) Patient left: in bed;with call bell/phone within reach;with bed alarm set Nurse Communication: Mobility status;Other (comment) (purwick off and O2 sats) PT Visit Diagnosis: History of falling (Z91.81);Other abnormalities of gait and mobility (R26.89)    Time: 8676-7209 PT Time Calculation (min) (ACUTE ONLY): 25  min   Charges:   PT Evaluation $PT Eval Moderate Complexity: 1 Mod PT Treatments $Therapeutic Activity: 8-22 mins   PT G Codes:   PT G-Codes **NOT FOR INPATIENT CLASS** Functional Assessment Tool Used: AM-PAC 6 Clicks Basic Mobility;Clinical judgement Functional Limitation: Mobility: Walking and moving around Mobility: Walking and Moving Around Current Status (O7096): At least 80 percent but less than 100 percent impaired, limited or restricted Mobility: Walking and Moving Around Goal Status [phone]): At least 20 percent but less than 40 percent impaired, limited or restricted    Ramond Dial 08/20/2017, 1:25 PM   Mee Hives, PT MS Acute Rehab Dept. Number: [facility] [id number] and [place], ascom [phone]

## 2017-08-20 NOTE — Progress Notes (Signed)
[facility] [place] NAME: Cynthia Price    MR#:  [id number]  DATE OF BIRTH:  1946/07/13  SUBJECTIVE:  CHIEF COMPLAINT:   Chief Complaint  Patient presents with  . Fall     Have MS, weakness- had a fall and pain , so brought to ER> Was recently in rehab, overall pain and weakness.  REVIEW OF SYSTEMS:  CONSTITUTIONAL: No fever, positive for fatigue or weakness.  EYES: No blurred or double vision.  EARS, NOSE, AND THROAT: No tinnitus or ear pain.  RESPIRATORY: No cough, shortness of breath, wheezing or hemoptysis.  CARDIOVASCULAR: No chest pain, orthopnea, edema.  GASTROINTESTINAL: No nausea, vomiting, diarrhea or abdominal pain.  GENITOURINARY: No dysuria, hematuria.  ENDOCRINE: No polyuria, nocturia,  HEMATOLOGY: No anemia, easy bruising or bleeding SKIN: No rash or lesion. MUSCULOSKELETAL: No joint pain or arthritis.   NEUROLOGIC: No tingling, numbness, weakness.  PSYCHIATRY: No anxiety or depression.   ROS  DRUG ALLERGIES:   Allergies  Allergen Reactions  . Atorvastatin     Muscle aches at >40mg      VITALS:  Blood pressure (!) 98/38, pulse 84, temperature 99.6 F (37.6 C), temperature source Oral, resp. rate 20, height 5\' 3"  (1.6 m), weight 56.7 kg (125 lb), SpO2 97 %.  PHYSICAL EXAMINATION:  GENERAL:  71 y.o.-year-old patient lying in the bed with acute distress due to generalized pain.  EYES: Pupils equal, round, reactive to light and accommodation. No scleral icterus. Extraocular muscles intact.  HEENT: Head atraumatic, normocephalic. Oropharynx and nasopharynx clear.  NECK:  Supple, no jugular venous distention. No thyroid enlargement, no tenderness.  LUNGS: Normal breath sounds bilaterally, no wheezing, rales,rhonchi or crepitation. No use of accessory muscles of respiration.  CARDIOVASCULAR: S1, S2 normal. No murmurs, rubs, or gallops.  ABDOMEN: Soft, nontender, nondistended. Bowel sounds present. No organomegaly or  mass.  EXTREMITIES: No pedal edema, cyanosis, or clubbing.  NEUROLOGIC: Cranial nerves II through XII are intact. Muscle strength 3-4/5 in all extremities. Sensation intact. Gait not checked.  PSYCHIATRIC: The patient is alert and oriented x 3.  SKIN: No obvious rash, lesion, or ulcer.   Physical Exam LABORATORY PANEL:   CBC  Recent Labs Lab 08/20/17 0650  WBC 4.5  HGB 10.4*  HCT 31.9*  PLT 316   ------------------------------------------------------------------------------------------------------------------  Chemistries   Recent Labs Lab 08/19/17 1709 08/20/17 0650  NA 141 141  K 4.1 4.2  CL 104 106  CO2 28 28  GLUCOSE 99 96  BUN 24* 23*  CREATININE 0.81 0.78  CALCIUM 9.1 8.4*  AST 25  --   ALT 18  --   ALKPHOS 61  --   BILITOT 0.5  --    ------------------------------------------------------------------------------------------------------------------  Cardiac Enzymes No results for input(s): TROPONINI in the last 168 hours. ------------------------------------------------------------------------------------------------------------------  RADIOLOGY:  Dg Chest 1 View  Result Date: 08/19/2017 CLINICAL DATA:  Fall walking to bathroom this afternoon with left shoulder pain. EXAM: CHEST 1 VIEW COMPARISON:  06/14/2015 and left shoulder series today. FINDINGS: Lungs are adequately inflated with mild left base opacification which may be due to atelectasis/ effusion. Mild hazy density over the left upper lung not present on the shoulder films today likely overlying soft tissues. Right lung is clear. Mild cardiomegaly. Degenerative change of the spine. No acute fracture identified. IMPRESSION: Mild hazy left base opacification which may be due to atelectasis/ effusion. Mild cardiomegaly. Electronically Signed   By: Marin Olp M.D.   On: 08/19/2017 17:34  Ct Chest W Contrast  Result Date: 08/19/2017 CLINICAL DATA:  Patient was attempting to get to bathroom and fell.  Patient just recently home from rehab and mostly wheelchair bound, hx of appy and chole. EXAM: CT CHEST, ABDOMEN, AND PELVIS WITH CONTRAST TECHNIQUE: Multidetector CT imaging of the chest, abdomen and pelvis was performed following the standard protocol during bolus administration of intravenous contrast. CONTRAST:  32mL ISOVUE-300 IOPAMIDOL (ISOVUE-300) INJECTION 61% COMPARISON:  Chest CT 06/14/2015, chest radiograph 08/19/2017 FINDINGS: CT CHEST FINDINGS Cardiovascular: Coronary artery calcification and aortic atherosclerotic calcification. Mediastinum/Nodes: No axillary supraclavicular adenopathy. No mediastinal hilar adenopathy. No pericardial fluid. Esophagus normal. Lungs/Pleura: The volume loss in the LEFT hemithorax. Mild basilar atelectasis. No pulmonary infiltrate followup pulmonary edema or pneumothorax. No suspicious nodularity. Airways normal. Musculoskeletal: No evidence of thoracic trauma. No rib fracture or clavicle fracture. No scapular fracture CT ABDOMEN AND PELVIS FINDINGS Hepatobiliary: Mild intra and extrahepatic biliary duct dilatation following cholecystectomy. Pancreas: Pancreas is normal. No ductal dilatation. No pancreatic inflammation. Spleen: No evidence splenic laceration. Adrenals/urinary tract: Adrenal glands normal. Kidneys enhance symmetrically. Bladder is intact. Stomach/Bowel: No evidence of bowel injury. Vascular/Lymphatic: Abdominal aorta normal without evidence of injury. Iliac artery is normal. 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 Pessary device in the vagina. For Other: No free fluid. Musculoskeletal: No evidence of pelvic fracture or spine fracture. Posterior lumbar fusion. IMPRESSION: Chest Impression: 1. No evidence of thoracic trauma. 2. Mild LEFT basilar atelectasis. Abdomen / Pelvis Impression: 1. No evidence of trauma in the abdomen pelvis. 2. No pelvic or spine fracture . Electronically Signed   By: Suzy Bouchard M.D.   On: 08/19/2017 19:00    Ct Abdomen Pelvis W Contrast  Result Date: 08/19/2017 CLINICAL DATA:  Patient was attempting to get to bathroom and fell. Patient just recently home from rehab and mostly wheelchair bound, hx of appy and chole. EXAM: CT CHEST, ABDOMEN, AND PELVIS WITH CONTRAST TECHNIQUE: Multidetector CT imaging of the chest, abdomen and pelvis was performed following the standard protocol during bolus administration of intravenous contrast. CONTRAST:  24mL ISOVUE-300 IOPAMIDOL (ISOVUE-300) INJECTION 61% COMPARISON:  Chest CT 06/14/2015, chest radiograph 08/19/2017 FINDINGS: CT CHEST FINDINGS Cardiovascular: Coronary artery calcification and aortic atherosclerotic calcification. Mediastinum/Nodes: No axillary supraclavicular adenopathy. No mediastinal hilar adenopathy. No pericardial fluid. Esophagus normal. Lungs/Pleura: The volume loss in the LEFT hemithorax. Mild basilar atelectasis. No pulmonary infiltrate followup pulmonary edema or pneumothorax. No suspicious nodularity. Airways normal. Musculoskeletal: No evidence of thoracic trauma. No rib fracture or clavicle fracture. No scapular fracture CT ABDOMEN AND PELVIS FINDINGS Hepatobiliary: Mild intra and extrahepatic biliary duct dilatation following cholecystectomy. Pancreas: Pancreas is normal. No ductal dilatation. No pancreatic inflammation. Spleen: No evidence splenic laceration. Adrenals/urinary tract: Adrenal glands normal. Kidneys enhance symmetrically. Bladder is intact. Stomach/Bowel: No evidence of bowel injury. Vascular/Lymphatic: Abdominal aorta normal without evidence of injury. Iliac artery is normal. 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 Pessary device in the vagina. For Other: No free fluid. Musculoskeletal: No evidence of pelvic fracture or spine fracture. Posterior lumbar fusion. IMPRESSION: Chest Impression: 1. No evidence of thoracic trauma. 2. Mild LEFT basilar atelectasis. Abdomen / Pelvis Impression: 1. No evidence of trauma in  the abdomen pelvis. 2. No pelvic or spine fracture . Electronically Signed   By: Suzy Bouchard M.D.   On: 08/19/2017 19:00   Dg Shoulder Left  Result Date: 08/19/2017 CLINICAL DATA:  71 y/o  F; status post fall with pain. EXAM: LEFT SHOULDER - 2+ VIEW COMPARISON:  None.  FINDINGS: There is no evidence of fracture or dislocation. There is no evidence of arthropathy or other focal bone abnormality. Soft tissues are unremarkable. IMPRESSION: Negative. Electronically Signed   By: Kristine Garbe M.D.   On: 08/19/2017 17:28   Dg Knee Complete 4 Views Left  Result Date: 08/19/2017 CLINICAL DATA:  Fall walking to bathroom this afternoon with left knee pain. EXAM: LEFT KNEE - COMPLETE 4+ VIEW COMPARISON:  None. FINDINGS: Osteopenia. Mild degenerative change of the lateral compartment and patellofemoral joints. No acute fracture or dislocation. No significant joint effusion. IMPRESSION: No acute findings. Mild osteoarthritic change most prominent over the lateral compartment. Electronically Signed   By: Marin Olp M.D.   On: 08/19/2017 17:31   Dg Hip Unilat With Pelvis 2-3 Views Left  Result Date: 08/19/2017 CLINICAL DATA:  Fall this afternoon walking to bathroom. Left hip pain. EXAM: DG HIP (WITH OR WITHOUT PELVIS) 2-3V LEFT COMPARISON:  11/02/2014 FINDINGS: Diffuse osteopenia. Mild symmetric degenerative change of the hips. No acute fracture or dislocation. Degenerative change of the spine. Partially visualized fusion hardware over the lower lumbar spine intact. Stable calcification over the right pelvis. IMPRESSION: No acute findings. Mild symmetric degenerative change of the hips. Electronically Signed   By: Marin Olp M.D.   On: 08/19/2017 17:30    ASSESSMENT AND PLAN:   Active Problems:   Intractable pain  Cynthia Price  is a 71 y.o. female with a known history of  MS, CHRONIC PAIN, history of DVT on Xarelto, depression, hyperlipidemia, chronic diastolic congestive heart failure is  present to the ED with intractable pain after she sustained a fall. Imaging studies did not reveal any broken bones but patient was unable to get out of the bed and ambulate as she was in severe pain and hospitalist team is called to admit the patient for pain management  #INTRACTABLE PAIN Admit patient to MedSurg unit Pain management as tolerated. Morphine IV for severe pain as needed   Added oral percocet. PT consult- suggest SNF.  #Chronic history of multiple sclerosis Continue home medication baclofen for muscle relaxation Continue Neurontin, tramadol Continue her home medication Ampyra   #History of hyperlipidemia Not on home medications check fasting lipid panel  # Fall   No infection found yet.   Likely deconditioning due to her Age and MS  #History of DVT continue Xarelto  DVT prophylaxis patient is on Xarelto   All the records are reviewed and case discussed with Care Management/Social Workerr. Management plans discussed with the patient, family and they are in agreement.  CODE STATUS: full.  TOTAL TIME TAKING CARE OF THIS PATIENT: 35 minutes.   POSSIBLE D/C IN 1-2 DAYS, DEPENDING ON CLINICAL CONDITION.   Vaughan Basta M.D on 08/20/2017   Between 7am to 6pm - Pager - [phone]  After 6pm go to [email or web address] - password [email or web address] [facility]  Office  [phone]  CC: Primary care physician; [name], MD  Note: This dictation was prepared with Dragon dictation along with smaller phrase technology. Any transcriptional errors that result from this process are unintentional.

## 2017-08-21 ENCOUNTER — Observation Stay: Payer: PPO

## 2017-08-21 DIAGNOSIS — S199XXA Unspecified injury of neck, initial encounter: Secondary | ICD-10-CM | POA: Diagnosis not present

## 2017-08-21 DIAGNOSIS — I82509 Chronic embolism and thrombosis of unspecified deep veins of unspecified lower extremity: Secondary | ICD-10-CM | POA: Diagnosis not present

## 2017-08-21 DIAGNOSIS — R202 Paresthesia of skin: Secondary | ICD-10-CM

## 2017-08-21 DIAGNOSIS — G35 Multiple sclerosis: Secondary | ICD-10-CM | POA: Diagnosis not present

## 2017-08-21 DIAGNOSIS — M542 Cervicalgia: Secondary | ICD-10-CM | POA: Diagnosis not present

## 2017-08-21 DIAGNOSIS — R52 Pain, unspecified: Secondary | ICD-10-CM | POA: Diagnosis not present

## 2017-08-21 DIAGNOSIS — E785 Hyperlipidemia, unspecified: Secondary | ICD-10-CM | POA: Diagnosis not present

## 2017-08-21 MED ORDER — GABAPENTIN 400 MG PO CAPS
800.0000 mg | ORAL_CAPSULE | Freq: Four times a day (QID) | ORAL | Status: DC
  Administered 2017-08-21 – 2017-08-22 (×5): 800 mg via ORAL
  Filled 2017-08-21 (×5): qty 2

## 2017-08-21 NOTE — Discharge Instructions (Addendum)
Need to follow with her neurologist in office in 2 weeks.  Information on my medicine - XARELTO (Rivaroxaban)  This medication education was reviewed with me or my healthcare representative as part of my discharge preparation.  The pharmacist that spoke with me during my hospital stay was:  Ramond Dial, [facility]  Why was Xarelto prescribed for you? Xarelto was prescribed for you to reduce the risk of a blood clot forming  What do you need to know about xarelto ? Take your Xarelto ONCE DAILY at the same time every day with your evening meal. If you have difficulty swallowing the tablet whole, you may crush it and mix in applesauce just prior to taking your dose.  Take Xarelto exactly as prescribed by your doctor and DO NOT stop taking Xarelto without talking to the doctor who prescribed the medication.  Stopping without other stroke prevention medication to take the place of Xarelto may increase your risk of developing a clot that causes a stroke.  Refill your prescription before you run out.  After discharge, you should have regular check-up appointments with your healthcare provider that is prescribing your Xarelto.  In the future your dose may need to be changed if your kidney function or weight changes by a significant amount.  What do you do if you miss a dose? If you are taking Xarelto ONCE DAILY and you miss a dose, take it as soon as you remember on the same day then continue your regularly scheduled once daily regimen the next day. Do not take two doses of Xarelto at the same time or on the same day.   Important Safety Information A possible side effect of Xarelto is bleeding. You should call your healthcare provider right away if you experience any of the following: ? Bleeding from an injury or your nose that does not stop. ? Unusual colored urine (red or dark brown) or unusual colored stools (red or black). ? Unusual bruising for unknown reasons. ? A serious fall or if  you hit your head (even if there is no bleeding).  Some medicines may interact with Xarelto and might increase your risk of bleeding while on Xarelto. To help avoid this, consult your healthcare provider or pharmacist prior to using any new prescription or non-prescription medications, including herbals, vitamins, non-steroidal anti-inflammatory drugs (NSAIDs) and supplements.  This website has more information on Xarelto: [email or web address]

## 2017-08-21 NOTE — Care Management Obs Status (Signed)
[place] NOTIFICATION   Patient Details  Name: Cynthia Price MRN: [id number] Date of Birth: 10-03-1946   Medicare Observation Status Notification Given:  Yes    Marshell Garfinkel, RN 08/21/2017, 1:01 PM

## 2017-08-21 NOTE — Plan of Care (Signed)
Problem: Safety: Goal: Ability to remain free from injury will improve Outcome: Progressing Pt is high fall risk with yellow arm band and yellow socks on. Bed alarm is on.

## 2017-08-21 NOTE — Clinical Social Work Note (Signed)
Clinical Social Work Assessment  Patient Details  Name: Cynthia Price MRN: [id number] Date of Birth: 09-Feb-1946  Date of referral:  08/21/17               Reason for consult:  Facility Placement, Discharge Planning, [employer]                Permission sought to share information with:  Tourist information centre manager, Customer service manager, Family Supports Permission granted to share information::  Yes, Verbal Permission Granted  Name::        Agency::     Relationship::  son at bed side  Contact Information:     Housing/Transportation Living arrangements for the past 2 months:  [place] of Information:  Patient, Medical Team, Case Manager, Adult Children Patient Interpreter Needed:  None Criminal Activity/Legal Involvement Pertinent to Current Situation/Hospitalization:  No - Comment as needed Significant Relationships:  Adult Children, Other Family Members Lives with:  Adult Children Do you feel safe going back to the place where you live?  No Need for family participation in patient care:  Yes (Comment)  Care giving concerns:  Patient coming from home after a fall and progressive pain.  Has a current chronic illness of MS and at this time son is interested in short term rehab for patient to get stronger. He also reports he has been searching the Internet and wants more information about PACE program.  LCSW provided education regarding PACE and made referral to CM to place referral for son.   At this time, both patient and son are hopeful for SNF placement in rehab.     Social Worker assessment / plan:  Consult placed for Short term rehab and discharge planning. Met with patient and family. Both agreeable. SNF work up completed and [facility] has been contacted for pre-auth and possible admit today.  Awaiting bed offers and insurance review and acceptance or denial. Will follow up.  Plan:  SNF  Employment status:  Retired Programmer, applications PT Recommendations:  [place] / Referral to community resources:  [place]  Patient/Family's Response to care:  Understanding  Patient/Family's Understanding of and Emotional Response to Diagnosis, Current Treatment, and Prognosis:  Still assessing, assessment was cut short due to neurologist arriving and needing to complete assessment. Will continue to follow up and assist with understanding and emotional responses.   Emotional Assessment Appearance:  Appears stated age Attitude/Demeanor/Rapport:    Affect (typically observed):  Accepting, Adaptable Orientation:  Oriented to Self, Oriented to Place, Oriented to  Time, Oriented to Situation Alcohol / Substance use:  Not Applicable Psych involvement (Current and /or in the community):  No (Comment)  Discharge Needs  Concerns to be addressed:  No discharge needs identified Readmission within the last 30 days:  No Current discharge risk:  None Barriers to Discharge:  Continued Medical Work up, [place], [place], [place] 08/21/2017, 1:32 PM

## 2017-08-21 NOTE — Clinical Social Work Placement (Addendum)
3:48 PM LCSW reviewed case with insurance company.  Patient has just completed therapy and willingly signed herself out on 9/12 to avoid copay days.   If patient returns to SNF *requesting [facility], she will have 1 day (day 20) at no cost and on day 21 she will begin paying 150.00 a day.    Insurance has authorized patient for 7 days, but anticipating very short stay (possibly less than 7 per report).  [name] (son) and patient were given all information and all options including: Pay copay Pay privately [place] for medicaid Hiring private sitters Having additional family members come help.  They are unclear of what they want to do, thus will follow up with CSW on Tuesday. MD has been made aware and still pending neurology work up and needs. Possibly DC on Tuesday if medically stable.   Plan: TBD  CLINICAL SOCIAL WORK PLACEMENT  NOTE  Date:  08/21/2017  Patient Details  Name: Cynthia Price MRN: [id number] Date of Birth: 04-28-1946  Clinical Social Work is seeking post-discharge placement for this patient at the [place] level of care (*CSW will initial, date and re-position this form in  chart as items are completed):  Yes   Patient/family provided with [facility] Work Department's list of facilities offering this level of care within the geographic area requested by the patient (or if unable, by the patient's family).  Yes   Patient/family informed of their freedom to choose among providers that offer the needed level of care, that participate in Medicare, Medicaid or managed care program needed by the patient, have an available bed and are willing to accept the patient.  Yes   Patient/family informed of [facility]'s ownership interest in [facility] and [facility], as well as of the fact that they are under no obligation to receive care at these facilities.  PASRR submitted to EDS on       PASRR number received on        Existing PASRR number confirmed on 08/21/17     FL2 transmitted to all facilities in geographic area requested by pt/family on 08/21/17     FL2 transmitted to all facilities within larger geographic area on       Patient informed that his/her managed care company has contracts with or will negotiate with certain facilities, including the following:            Patient/family informed of bed offers received.  Patient chooses bed at       Physician recommends and patient chooses bed at      Patient to be transferred to   on  .  Patient to be transferred to facility by       Patient family notified on   of transfer.  Name of family member notified:        PHYSICIAN Please sign FL2, Please sign DNR     Additional Comment:    _______________________________________________ [name], LCSW 08/21/2017, 1:31 PM

## 2017-08-21 NOTE — NC FL2 (Signed)
[place] LEVEL OF CARE SCREENING TOOL     IDENTIFICATION  Patient Name: Cynthia Price Birthdate: 23-Dec-1945 Sex: female Admission Date (Current Location): 08/19/2017  [place] and [place] Number:  Engineering geologist and Address:  [facility], [address] [place], [place] [address]      Provider Number: [id number]  Attending Physician Name and Address:  Vaughan Basta, *  Relative Name and Phone Number:       Current Level of Care: Hospital Recommended Level of Care: [place] Prior Approval Number:    Date Approved/Denied:   PASRR Number:   [id number]  Discharge Plan: SNF    Current Diagnoses: Patient Active Problem List   Diagnosis Date Noted  . Intractable pain 08/19/2017  . DS (disseminated sclerosis) ([place]) 09/15/2015  . Back sprain 06/05/2015  . Chest wall contusion 06/05/2015  . Effusion of knee 05/22/2015  . Arthritis of knee, degenerative 05/22/2015  . Contusion of shoulder 04/14/2015  . 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[place]) 03/05/2015  . Complete rotator cuff rupture of left shoulder 02/05/2015  . Infraspinatus tenosynovitis 02/05/2015  . Chronic diastolic heart failure ([place]) 09/07/2014  . Deep vein thrombosis ([place]) 07/10/2014  . HLD (hyperlipidemia) 03/08/2014  . BP (high blood pressure) 03/08/2014  . UTI (urinary tract infection) 02/05/2014  . Cellulitis, toe 08/18/2013  . Bradycardia 03/26/2013  . Hypotension 03/26/2013  . RLS (restless legs syndrome) 08/31/2012  . CN (constipation) 07/20/2012  . Prolapse of urethra 07/20/2012  . Frequent UTI 07/20/2012  . Neurogenic dysfunction of the urinary bladder 07/20/2012  . Atrophy of vagina 07/20/2012  . DVT (deep venous thrombosis) ([place]) 05/14/2012  . Risk for falls 03/16/2012  . Lumbar canal stenosis 02/16/2012  . Restless leg 01/20/2012  . Urinary retention 12/04/2011  . CAD (coronary artery disease) 10/18/2011  . Anemia  10/17/2011  . Diastolic dysfunction 72/53/6644  . Tachycardia 10/01/2011  . Thrombocythemia, essential ([place]) 05/26/2011  . Edema 05/26/2011  . VITAMIN D DEFICIENCY 04/29/2010  . HYPERCHOLESTEROLEMIA 04/29/2010  . DEPRESSION, MILD 04/29/2010  . CYSTOCELE WITHOUT MENTION UTERINE PROLAPSE LAT 04/29/2010  . UNS ADVRS EFF OTH RX MEDICINAL&BIOLOGICAL SBSTNC 04/29/2010  . Multiple sclerosis ([place]) 04/29/2008  . NEUROPATHY 04/29/2008  . ARTHRITIS 04/29/2008  . FATIGUE 04/29/2008    Orientation RESPIRATION BLADDER Height & Weight     Self, Time, Situation, Place  O2 (2L) Incontinent, External catheter Weight: 125 lb (56.7 kg) Height:  5\' 3"  (160 cm)  BEHAVIORAL SYMPTOMS/MOOD NEUROLOGICAL BOWEL NUTRITION STATUS      Incontinent Diet (See DC summary)  AMBULATORY STATUS COMMUNICATION OF NEEDS Skin   Extensive Assist Verbally Normal                       Personal Care Assistance Level of Assistance  Bathing, Feeding, Dressing Bathing Assistance: Maximum assistance Feeding assistance: Limited assistance Dressing Assistance: Maximum assistance     Functional Limitations Info  Sight, Hearing, Speech Sight Info: Adequate Hearing Info: Adequate Speech Info: Adequate    SPECIAL CARE FACTORS FREQUENCY  PT (By licensed PT), OT (By licensed OT)     PT Frequency: 5x a week OT Frequency: 5x a week            Contractures Contractures Info: Not present    Additional Factors Info  Code Status, Allergies Code Status Info: DNR Allergies Info: Atorvastatin           Current Medications (08/21/2017):  This is the current hospital  active medication list Current Facility-Administered Medications  Medication Dose Route Frequency Provider Last Rate Last Dose  . baclofen (LIORESAL) tablet 10 mg  10 mg Oral TID PRN Nicholes Mango, MD   10 mg at 08/20/17 1315  . dalfampridine TB12 10 mg  10 mg Oral BID Nicholes Mango, MD   10 mg at 08/21/17 1107  . gabapentin (NEURONTIN) capsule 800 mg   800 mg Oral TID Vaughan Basta, MD   800 mg at 08/21/17 1043  . hydroxyurea (HYDREA) capsule 500 mg  500 mg Oral Once per day on Mon Tue Wed Thu Fri Gouru, Aruna, MD   500 mg at 08/21/17 1041  . Influenza vac split quadrivalent PF (FLUZONE HIGH-DOSE) injection 0.5 mL  0.5 mL Intramuscular Tomorrow-1000 Vaughan Basta, MD      . lidocaine (LIDODERM) 5 % 2 patch  2 patch Transdermal Q24H Lance Coon, MD   2 patch at 08/21/17 0230  . morphine 2 MG/ML injection 2 mg  2 mg Intravenous Q4H PRN Gouru, Aruna, MD      . ondansetron (ZOFRAN) tablet 4 mg  4 mg Oral Q6H PRN Gouru, Aruna, MD       Or  . ondansetron (ZOFRAN) injection 4 mg  4 mg Intravenous Q6H PRN Gouru, Aruna, MD      . oxyCODONE-acetaminophen (PERCOCET/ROXICET) 5-325 MG per tablet 1 tablet  1 tablet Oral Q6H PRN Vaughan Basta, MD   1 tablet at 08/21/17 [id number]  . rivaroxaban (XARELTO) tablet 20 mg  20 mg Oral Q breakfast Gouru, Aruna, MD   20 mg at 08/21/17 1042  . rOPINIRole (REQUIP) tablet 2 mg  2 mg Oral QID Gouru, Aruna, MD   2 mg at 08/21/17 1042  . traMADol (ULTRAM) tablet 50 mg  50 mg Oral Q6H PRN Gouru, Aruna, MD   50 mg at 08/21/17 0219  . trimethoprim (TRIMPEX) tablet 100 mg  100 mg Oral Daily Gouru, Aruna, MD   100 mg at 08/20/17 0840     Discharge Medications: Please see discharge summary for a list of discharge medications.  Relevant Imaging Results:  Relevant Lab Results:   Additional Information SSN:   [id number]  [name], [place]

## 2017-08-21 NOTE — Care Management (Signed)
Met with patient and she would like to go to [facility] for rehab. CSW updated.

## 2017-08-21 NOTE — Consult Note (Signed)
Reason for Consult:Right arm pain Referring Physician: Anselm Jungling  CC: Right arm pain and numbness  HPI: Cynthia Price is an 71 y.o. female with a history of MS admitted after a fall at home in the bathroom due to intractable pain.  Patient reports that she fell while in the bathroom.  Has a history of MS since the age of 20.  Is currently followed by neurology at [facility].  Reports she has had difficulty with gait for the past few years.  Also has problems with both upper extremities, left more than right.  Since the fall has had neck pain.  The right shoulder hurts as well as the fact that she now has numbness in the right hand (all fingers).  Shoulder pain has improved since admission.  Past Medical History:  Diagnosis Date  . Anemia 2012   from labs at [facility]  . Back pain    s/p hemilaminectomy with h/o herniated disc at L4L5  . Blood clot in vein    behind left knee  . BP (high blood pressure) 03/08/2014   Last Assessment & Plan:  Blood pressure has been controlled without significant dizzyness or associated fatigue. Taking antihypertensives as directed without difficulty.    Marland Kitchen CAD (coronary artery disease) 10/18/2011  . Chronic diastolic heart failure ([place]) 09/07/2014   Last Assessment & Plan:  Edema and sob seemingly baseline   . Collagen vascular disease ([place])   . Depression    after husband died  . DVT (deep venous thrombosis) ([place]) 05/14/2012  . Essential thrombocytosis ([place])   . Hyperlipidemia   . 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[place]) 03/05/2015  . Hypotension 03/26/2013  . IBS (irritable bowel syndrome)   . Insomnia   . Lupus anticoagulant positive   . Macular degeneration   . MS (multiple sclerosis) ([place])   . Multiple sclerosis ([place]) 04/29/2008   Per neurology at [facility]    . NEUROPATHY 04/29/2008   Per Neurology at [facility]    . RLS (restless legs syndrome)   . Spinal stenosis    lumbar  . Urinary retention    with high post void residuals 2013, per Mallie Mussel    Past Surgical  History:  Procedure Laterality Date  . APPENDECTOMY    . CHOLECYSTECTOMY    . CHOLECYSTECTOMY    . LUMBAR LAMINECTOMY      Family History  Problem Relation Age of Onset  . Heart disease Mother   . Thyroid cancer Mother   . Ovarian cancer Mother   . Alcohol abuse Father   . Heart disease Father   . Diabetes Sister   . Diabetes Brother     Social History:  reports that she has never smoked. She has never used smokeless tobacco. She reports that she does not drink alcohol or use drugs.  Allergies  Allergen Reactions  . Atorvastatin     Muscle aches at >40mg      Medications:  I have reviewed the patient's current medications. Prior to Admission:  Prescriptions Prior to Admission  Medication Sig Dispense Refill Last Dose  . baclofen (LIORESAL) 10 MG tablet Take 10 mg by mouth 3 (three) times daily as needed.    PRN at PRN  . gabapentin (NEURONTIN) 800 MG tablet Take 800 mg by mouth 4 (four) times daily.    08/19/2017 at 0800  . hydroxyurea (HYDREA) 500 MG capsule Take one by mouth Monday, Tuesday, Wed, Thursday, Friday 90 capsule 3 08/18/2017 at 0800  . rOPINIRole (REQUIP) 2 MG tablet  Take 2 mg by mouth 4 (four) times daily.   08/19/2017 at 0800  . traMADol (ULTRAM) 50 MG tablet Take 1 tablet (50 mg total) by mouth every 6 (six) hours as needed. 120 tablet 12 PRN at PRN  . XARELTO 20 MG TABS tablet Take 20 mg by mouth daily with breakfast.    08/19/2017 at 0800  . AMPYRA 10 MG TB12 Take 10 mg by mouth 2 (two) times daily.      Marland Kitchen rOPINIRole (REQUIP) 1 MG tablet TAKE ONE TABLET FOUR TIMES DAILY (Patient not taking: Reported on 08/19/2017) 120 tablet 5 Not Taking at Unknown time  . trimethoprim (TRIMPEX) 100 MG tablet Take 1 tablet (100 mg total) by mouth daily. (Patient not taking: Reported on 08/11/2017) 90 tablet 3 Not Taking at Unknown time   Scheduled: . dalfampridine  10 mg Oral BID  . gabapentin  800 mg Oral QID  . hydroxyurea  500 mg Oral Once per day on Mon Tue Wed Thu Fri  .  Influenza vac split quadrivalent PF  0.5 mL Intramuscular Tomorrow-1000  . lidocaine  2 patch Transdermal Q24H  . rivaroxaban  20 mg Oral Q breakfast  . rOPINIRole  2 mg Oral QID    ROS: History obtained from the patient  General ROS: falls asleep frequently Psychological ROS: negative for - behavioral disorder, hallucinations, memory difficulties, mood swings or suicidal ideation Ophthalmic ROS: negative for - blurry vision, double vision, eye pain or loss of vision ENT ROS: negative for - epistaxis, nasal discharge, oral lesions, sore throat, tinnitus or vertigo Allergy and Immunology ROS: negative for - hives or itchy/watery eyes Hematological and Lymphatic ROS: negative for - bleeding problems, bruising or swollen lymph nodes Endocrine ROS: negative for - galactorrhea, hair pattern changes, polydipsia/polyuria or temperature intolerance Respiratory ROS: negative for - cough, hemoptysis, shortness of breath or wheezing Cardiovascular ROS: negative for - chest pain, dyspnea on exertion, edema or irregular heartbeat Gastrointestinal ROS: negative for - abdominal pain, diarrhea, hematemesis, nausea/vomiting or stool incontinence Genito-Urinary ROS: negative for - dysuria, hematuria, incontinence or urinary frequency/urgency Musculoskeletal ROS: joint pain Neurological ROS: as noted in HPI Dermatological ROS: negative for rash and skin lesion changes  Physical Examination: Blood pressure (!) 114/51, pulse 81, temperature 98.8 F (37.1 C), temperature source Oral, resp. rate 20, height 5\' 3"  (1.6 m), weight 56.7 kg (125 lb), SpO2 93 %.  HEENT-  Normocephalic, no lesions, without obvious abnormality.  Normal external eye and conjunctiva.  Normal TM's bilaterally.  Normal auditory canals and external ears. Normal external nose, mucus membranes and septum.  Normal pharynx. Cardiovascular- S1, S2 normal, pulses palpable throughout   Lungs- chest clear, no wheezing, rales, normal symmetric air  entry Abdomen- soft, non-tender; bowel sounds normal; no masses,  no organomegaly Extremities- no edema Lymph-no adenopathy palpable Musculoskeletal-pain on palpation of the left knee and right shoulder Skin-warm and dry, no hyperpigmentation, vitiligo, or suspicious lesions  Neurological Examination   Mental Status: Alert, oriented, thought content appropriate.  Speech fluent without evidence of aphasia.  Able to follow 3 step commands without difficulty. Cranial Nerves: II: Discs flat bilaterally; Visual fields grossly normal, pupils equal, round, reactive to light and accommodation III,IV, VI: ptosis not present, extra-ocular motions intact bilaterally V,VII: decreased left NLF, facial light touch sensation normal bilaterally VIII: hearing normal bilaterally IX,X: gag reflex present XI: bilateral shoulder shrug XII: midline tongue extension Motor: Right : Upper extremity   4+/5    Left:     Upper  extremity   4+/5  Lower extremity   3-4/5    Lower extremity   3-4/5 Tone and bulk:normal tone throughout; no atrophy noted Sensory: Pinprick and light touch intact throughout, bilaterally Deep Tendon Reflexes: 2+ and symmetric throughout Plantars: Right: upgoing   Left: upgoing Cerebellar: Normal finger-to-nose testing bilaterally Gait: not tested due to safety concerns    Laboratory Studies:   Basic Metabolic Panel:  Recent Labs Lab 08/19/17 1709 08/20/17 0650  NA 141 141  K 4.1 4.2  CL 104 106  CO2 28 28  GLUCOSE 99 96  BUN 24* 23*  CREATININE 0.81 0.78  CALCIUM 9.1 8.4*    Liver Function Tests:  Recent Labs Lab 08/19/17 1709  AST 25  ALT 18  ALKPHOS 61  BILITOT 0.5  PROT 7.0  ALBUMIN 3.9   No results for input(s): LIPASE, AMYLASE in the last 168 hours. No results for input(s): AMMONIA in the last 168 hours.  CBC:  Recent Labs Lab 08/19/17 1709 08/20/17 0650  WBC 5.8 4.5  NEUTROABS 4.1  --   HGB 12.1 10.4*  HCT 36.6 31.9*  MCV 93.7 92.8  PLT 373  316    Cardiac Enzymes: No results for input(s): CKTOTAL, CKMB, CKMBINDEX, TROPONINI in the last 168 hours.  BNP: Invalid input(s): POCBNP  CBG: No results for input(s): GLUCAP in the last 168 hours.  Microbiology: Results for orders placed or performed in visit on 09/25/15  CULTURE, URINE COMPREHENSIVE     Status: None   Collection Time: 09/25/15 10:14 AM  Result Value Ref Range Status   Urine Culture, Comprehensive Final report  Final   Organism ID, Bacteria Comment  Final    Comment: No growth in 36 - 48 hours.  Microscopic Examination     Status: None   Collection Time: 09/25/15 10:20 AM  Result Value Ref Range Status   WBC, UA None seen 0 - 5 /hpf Final   RBC, UA None seen 0 - 2 /hpf Final   Epithelial Cells (non renal) None seen 0 - 10 /hpf Final   Renal Epithel, UA None seen None seen /hpf Final   Bacteria, UA None seen None seen/Few Final    Coagulation Studies: No results for input(s): LABPROT, INR in the last 72 hours.  Urinalysis:  Recent Labs Lab 08/19/17 1709  COLORURINE COLORLESS*  LABSPEC 1.006  PHURINE 7.0  GLUCOSEU NEGATIVE  HGBUR NEGATIVE  BILIRUBINUR NEGATIVE  KETONESUR NEGATIVE  PROTEINUR NEGATIVE  NITRITE NEGATIVE  LEUKOCYTESUR TRACE*    Lipid Panel:     Component Value Date/Time   CHOL 162 08/20/2017 0650   CHOL 190 05/21/2012 0840   TRIG 49 08/20/2017 0650   HDL 72 08/20/2017 0650   HDL 98 05/21/2012 0840   CHOLHDL 2.3 08/20/2017 0650   VLDL 10 08/20/2017 0650   LDLCALC 80 08/20/2017 0650   LDLCALC 82 05/21/2012 0840    HgbA1C: No results found for: HGBA1C  Urine Drug Screen:  No results found for: LABOPIA, COCAINSCRNUR, LABBENZ, AMPHETMU, THCU, LABBARB  Alcohol Level: No results for input(s): ETH in the last 168 hours.  Other results: EKG: sinus rhythm at 92 bpm.  Imaging: Ct Head Wo Contrast  Result Date: 08/20/2017 CLINICAL DATA:  Fall.  History of multiple sclerosis. EXAM: CT HEAD WITHOUT CONTRAST TECHNIQUE:  Contiguous axial images were obtained from the base of the skull through the vertex without intravenous contrast. COMPARISON:  Head CT 11/02/2014 FINDINGS: Brain: No mass lesion, intraparenchymal hemorrhage or extra-axial collection. No  evidence of acute cortical infarct. Periventricular white matter disease has worsened compared to the prior study. Size and configuration of ventricles are unchanged. Vascular: No hyperdense vessel or unexpected calcification. Skull: Normal visualized skull base, calvarium and extracranial soft tissues. Sinuses/Orbits: No sinus fluid levels or advanced mucosal thickening. No mastoid effusion. Normal orbits. IMPRESSION: 1. Confluent white matter disease, progressed from the prior study and likely indicating worsening demyelinating disease. 2. No acute abnormality. Electronically Signed   By: Ulyses Jarred M.D.   On: 08/20/2017 22:20   Ct Cervical Spine Wo Contrast  Result Date: 08/21/2017 CLINICAL DATA:  Pain and weakness, fall. History of multiple sclerosis. EXAM: CT CERVICAL SPINE WITHOUT CONTRAST TECHNIQUE: Multidetector CT imaging of the cervical spine was performed without intravenous contrast. Multiplanar CT image reconstructions were also generated. COMPARISON:  None. FINDINGS: ALIGNMENT: Maintained lordosis. Vertebral bodies in alignment. SKULL BASE AND VERTEBRAE: Cervical vertebral bodies and posterior elements are intact. Moderate to severe C5-6 and C6-7 disc height loss with endplate sclerosis and marginal spurring compatible with degenerative discs, mild at C4-5. Osteopenia without destructive bony lesions. C1-2 articulation maintained with moderate arthropathy. SOFT TISSUES AND SPINAL CANAL: Normal. DISC LEVELS: No significant osseous canal stenosis. Multilevel mild to moderate neural foraminal narrowing. UPPER CHEST: Lung apices are clear. OTHER: None. IMPRESSION: No acute fracture deformity or malalignment. Electronically Signed   By: Elon Alas M.D.   On:  08/21/2017 20:15     Assessment/Plan: 71 year old female with a long history of MS.  Has difficulty with gait and chronic pain.  Since fall has had more pain but this appears to be slowly improving.  Also has new numbness in her right hand.  CT of the cervical spine ordered and reviewed.  Spinal canal preserved.  Multilevel neuroforaminal stenosis noted.  Recommendations: 1. PT/OT 2. If no improvement with therapy would consider NCV/EMG on an outpatient basis 3. Patient with multiple chronic complaints that would be best addressed by her outpatient neurologist.    Alexis Goodell, MD Neurology [phone] 08/21/2017, 9:32 PM

## 2017-08-21 NOTE — Consult Note (Signed)
Reason for Consult:Right arm pain Referring Physician: Anselm Jungling  CC: Right arm pain and numbness  HPI: Cynthia Price is an 71 y.o. female with a history of MS admitted after a fall at home in the bathroom due to intractable pain.  Patient reports that she fell while in the bathroom.  Has a history of MS since the age of 60.  Is currently followed by neurology at [facility].  Reports she has had difficulty with gait for the past few years.  Also has problems with both upper extremities, left more than right.  Since the fall has had neck pain.  The right shoulder hurts as well as the fact that she now has numbness in the right hand (all fingers).  Shoulder pain has improved since admission.  Past Medical History:  Diagnosis Date  . Anemia 2012   from labs at [facility]  . Back pain    s/p hemilaminectomy with h/o herniated disc at L4L5  . Blood clot in vein    behind left knee  . BP (high blood pressure) 03/08/2014   Last Assessment & Plan:  Blood pressure has been controlled without significant dizzyness or associated fatigue. Taking antihypertensives as directed without difficulty.    Marland Kitchen CAD (coronary artery disease) 10/18/2011  . Chronic diastolic heart failure ([place]) 09/07/2014   Last Assessment & Plan:  Edema and sob seemingly baseline   . Collagen vascular disease ([place])   . Depression    after husband died  . DVT (deep venous thrombosis) ([place]) 05/14/2012  . Essential thrombocytosis ([place])   . Hyperlipidemia   . 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[place]) 03/05/2015  . Hypotension 03/26/2013  . IBS (irritable bowel syndrome)   . Insomnia   . Lupus anticoagulant positive   . Macular degeneration   . MS (multiple sclerosis) ([place])   . Multiple sclerosis ([place]) 04/29/2008   Per neurology at [facility]    . NEUROPATHY 04/29/2008   Per Neurology at [facility]    . RLS (restless legs syndrome)   . Spinal stenosis    lumbar  . Urinary retention    with high post void residuals 2013, per Mallie Mussel    Past Surgical  History:  Procedure Laterality Date  . APPENDECTOMY    . CHOLECYSTECTOMY    . CHOLECYSTECTOMY    . LUMBAR LAMINECTOMY      Family History  Problem Relation Age of Onset  . Heart disease Mother   . Thyroid cancer Mother   . Ovarian cancer Mother   . Alcohol abuse Father   . Heart disease Father   . Diabetes Sister   . Diabetes Brother     Social History:  reports that she has never smoked. She has never used smokeless tobacco. She reports that she does not drink alcohol or use drugs.  Allergies  Allergen Reactions  . Atorvastatin     Muscle aches at >40mg      Medications:  I have reviewed the patient's current medications. Prior to Admission:  Prescriptions Prior to Admission  Medication Sig Dispense Refill Last Dose  . baclofen (LIORESAL) 10 MG tablet Take 10 mg by mouth 3 (three) times daily as needed.    PRN at PRN  . gabapentin (NEURONTIN) 800 MG tablet Take 800 mg by mouth 4 (four) times daily.    08/19/2017 at 0800  . hydroxyurea (HYDREA) 500 MG capsule Take one by mouth Monday, Tuesday, Wed, Thursday, Friday 90 capsule 3 08/18/2017 at 0800  . rOPINIRole (REQUIP) 2 MG tablet  Take 2 mg by mouth 4 (four) times daily.   08/19/2017 at 0800  . traMADol (ULTRAM) 50 MG tablet Take 1 tablet (50 mg total) by mouth every 6 (six) hours as needed. 120 tablet 12 PRN at PRN  . XARELTO 20 MG TABS tablet Take 20 mg by mouth daily with breakfast.    08/19/2017 at 0800  . AMPYRA 10 MG TB12 Take 10 mg by mouth 2 (two) times daily.      Marland Kitchen rOPINIRole (REQUIP) 1 MG tablet TAKE ONE TABLET FOUR TIMES DAILY (Patient not taking: Reported on 08/19/2017) 120 tablet 5 Not Taking at Unknown time  . trimethoprim (TRIMPEX) 100 MG tablet Take 1 tablet (100 mg total) by mouth daily. (Patient not taking: Reported on 08/11/2017) 90 tablet 3 Not Taking at Unknown time   Scheduled: . dalfampridine  10 mg Oral BID  . gabapentin  800 mg Oral QID  . hydroxyurea  500 mg Oral Once per day on Mon Tue Wed Thu Fri  .  Influenza vac split quadrivalent PF  0.5 mL Intramuscular Tomorrow-1000  . lidocaine  2 patch Transdermal Q24H  . rivaroxaban  20 mg Oral Q breakfast  . rOPINIRole  2 mg Oral QID    ROS: History obtained from the patient  General ROS: falls asleep frequently Psychological ROS: negative for - behavioral disorder, hallucinations, memory difficulties, mood swings or suicidal ideation Ophthalmic ROS: negative for - blurry vision, double vision, eye pain or loss of vision ENT ROS: negative for - epistaxis, nasal discharge, oral lesions, sore throat, tinnitus or vertigo Allergy and Immunology ROS: negative for - hives or itchy/watery eyes Hematological and Lymphatic ROS: negative for - bleeding problems, bruising or swollen lymph nodes Endocrine ROS: negative for - galactorrhea, hair pattern changes, polydipsia/polyuria or temperature intolerance Respiratory ROS: negative for - cough, hemoptysis, shortness of breath or wheezing Cardiovascular ROS: negative for - chest pain, dyspnea on exertion, edema or irregular heartbeat Gastrointestinal ROS: negative for - abdominal pain, diarrhea, hematemesis, nausea/vomiting or stool incontinence Genito-Urinary ROS: negative for - dysuria, hematuria, incontinence or urinary frequency/urgency Musculoskeletal ROS: joint pain Neurological ROS: as noted in HPI Dermatological ROS: negative for rash and skin lesion changes  Physical Examination: Blood pressure (!) 114/51, pulse 81, temperature 98.8 F (37.1 C), temperature source Oral, resp. rate 20, height 5\' 3"  (1.6 m), weight 56.7 kg (125 lb), SpO2 93 %.  HEENT-  Normocephalic, no lesions, without obvious abnormality.  Normal external eye and conjunctiva.  Normal TM's bilaterally.  Normal auditory canals and external ears. Normal external nose, mucus membranes and septum.  Normal pharynx. Cardiovascular- S1, S2 normal, pulses palpable throughout   Lungs- chest clear, no wheezing, rales, normal symmetric air  entry Abdomen- soft, non-tender; bowel sounds normal; no masses,  no organomegaly Extremities- no edema Lymph-no adenopathy palpable Musculoskeletal-pain on palpation of the left knee and right shoulder Skin-warm and dry, no hyperpigmentation, vitiligo, or suspicious lesions  Neurological Examination   Mental Status: Alert, oriented, thought content appropriate.  Speech fluent without evidence of aphasia.  Able to follow 3 step commands without difficulty. Cranial Nerves: II: Discs flat bilaterally; Visual fields grossly normal, pupils equal, round, reactive to light and accommodation III,IV, VI: ptosis not present, extra-ocular motions intact bilaterally V,VII: decreased left NLF, facial light touch sensation normal bilaterally VIII: hearing normal bilaterally IX,X: gag reflex present XI: bilateral shoulder shrug XII: midline tongue extension Motor: Right : Upper extremity   4+/5    Left:     Upper  extremity   4+/5  Lower extremity   3-4/5    Lower extremity   3-4/5 Tone and bulk:normal tone throughout; no atrophy noted Sensory: Pinprick and light touch intact throughout, bilaterally Deep Tendon Reflexes: 2+ and symmetric throughout Plantars: Right: upgoing   Left: upgoing Cerebellar: Normal finger-to-nose testing bilaterally Gait: not tested due to safety concerns    Laboratory Studies:   Basic Metabolic Panel:  Recent Labs Lab 08/19/17 1709 08/20/17 0650  NA 141 141  K 4.1 4.2  CL 104 106  CO2 28 28  GLUCOSE 99 96  BUN 24* 23*  CREATININE 0.81 0.78  CALCIUM 9.1 8.4*    Liver Function Tests:  Recent Labs Lab 08/19/17 1709  AST 25  ALT 18  ALKPHOS 61  BILITOT 0.5  PROT 7.0  ALBUMIN 3.9   No results for input(s): LIPASE, AMYLASE in the last 168 hours. No results for input(s): AMMONIA in the last 168 hours.  CBC:  Recent Labs Lab 08/19/17 1709 08/20/17 0650  WBC 5.8 4.5  NEUTROABS 4.1  --   HGB 12.1 10.4*  HCT 36.6 31.9*  MCV 93.7 92.8  PLT 373  316    Cardiac Enzymes: No results for input(s): CKTOTAL, CKMB, CKMBINDEX, TROPONINI in the last 168 hours.  BNP: Invalid input(s): POCBNP  CBG: No results for input(s): GLUCAP in the last 168 hours.  Microbiology: Results for orders placed or performed in visit on 09/25/15  CULTURE, URINE COMPREHENSIVE     Status: None   Collection Time: 09/25/15 10:14 AM  Result Value Ref Range Status   Urine Culture, Comprehensive Final report  Final   Organism ID, Bacteria Comment  Final    Comment: No growth in 36 - 48 hours.  Microscopic Examination     Status: None   Collection Time: 09/25/15 10:20 AM  Result Value Ref Range Status   WBC, UA None seen 0 - 5 /hpf Final   RBC, UA None seen 0 - 2 /hpf Final   Epithelial Cells (non renal) None seen 0 - 10 /hpf Final   Renal Epithel, UA None seen None seen /hpf Final   Bacteria, UA None seen None seen/Few Final    Coagulation Studies: No results for input(s): LABPROT, INR in the last 72 hours.  Urinalysis:  Recent Labs Lab 08/19/17 1709  COLORURINE COLORLESS*  LABSPEC 1.006  PHURINE 7.0  GLUCOSEU NEGATIVE  HGBUR NEGATIVE  BILIRUBINUR NEGATIVE  KETONESUR NEGATIVE  PROTEINUR NEGATIVE  NITRITE NEGATIVE  LEUKOCYTESUR TRACE*    Lipid Panel:     Component Value Date/Time   CHOL 162 08/20/2017 0650   CHOL 190 05/21/2012 0840   TRIG 49 08/20/2017 0650   HDL 72 08/20/2017 0650   HDL 98 05/21/2012 0840   CHOLHDL 2.3 08/20/2017 0650   VLDL 10 08/20/2017 0650   LDLCALC 80 08/20/2017 0650   LDLCALC 82 05/21/2012 0840    HgbA1C: No results found for: HGBA1C  Urine Drug Screen:  No results found for: LABOPIA, COCAINSCRNUR, LABBENZ, AMPHETMU, THCU, LABBARB  Alcohol Level: No results for input(s): ETH in the last 168 hours.  Other results: EKG: sinus rhythm at 92 bpm.  Imaging: Ct Head Wo Contrast  Result Date: 08/20/2017 CLINICAL DATA:  Fall.  History of multiple sclerosis. EXAM: CT HEAD WITHOUT CONTRAST TECHNIQUE:  Contiguous axial images were obtained from the base of the skull through the vertex without intravenous contrast. COMPARISON:  Head CT 11/02/2014 FINDINGS: Brain: No mass lesion, intraparenchymal hemorrhage or extra-axial collection. No  evidence of acute cortical infarct. Periventricular white matter disease has worsened compared to the prior study. Size and configuration of ventricles are unchanged. Vascular: No hyperdense vessel or unexpected calcification. Skull: Normal visualized skull base, calvarium and extracranial soft tissues. Sinuses/Orbits: No sinus fluid levels or advanced mucosal thickening. No mastoid effusion. Normal orbits. IMPRESSION: 1. Confluent white matter disease, progressed from the prior study and likely indicating worsening demyelinating disease. 2. No acute abnormality. Electronically Signed   By: Ulyses Jarred M.D.   On: 08/20/2017 22:20   Ct Cervical Spine Wo Contrast  Result Date: 08/21/2017 CLINICAL DATA:  Pain and weakness, fall. History of multiple sclerosis. EXAM: CT CERVICAL SPINE WITHOUT CONTRAST TECHNIQUE: Multidetector CT imaging of the cervical spine was performed without intravenous contrast. Multiplanar CT image reconstructions were also generated. COMPARISON:  None. FINDINGS: ALIGNMENT: Maintained lordosis. Vertebral bodies in alignment. SKULL BASE AND VERTEBRAE: Cervical vertebral bodies and posterior elements are intact. Moderate to severe C5-6 and C6-7 disc height loss with endplate sclerosis and marginal spurring compatible with degenerative discs, mild at C4-5. Osteopenia without destructive bony lesions. C1-2 articulation maintained with moderate arthropathy. SOFT TISSUES AND SPINAL CANAL: Normal. DISC LEVELS: No significant osseous canal stenosis. Multilevel mild to moderate neural foraminal narrowing. UPPER CHEST: Lung apices are clear. OTHER: None. IMPRESSION: No acute fracture deformity or malalignment. Electronically Signed   By: Elon Alas M.D.   On:  08/21/2017 20:15     Assessment/Plan: 71 year old female with a long history of MS.  Has difficulty with gait and chronic pain.  Since fall has had more pain but this appears to be slowly improving.  Also has new numbness in her right hand.  CT of the cervical spine ordered and reviewed.  Sinal canal preserved.  Multilevel neuroforaminal stenosis noted as well.    Recommendations: 1.  PT/OT 2.  If symptoms continue despite therapy patient may benefit from a NCV/EMG as an outpatient. 3.  Patient has multiple other chronic complaints that would be best addressed by her outpatient neurologist.       Alexis Goodell, MD Neurology [phone] 08/21/2017, 9:32 PM

## 2017-08-22 DIAGNOSIS — I272 Pulmonary hypertension, unspecified: Secondary | ICD-10-CM | POA: Diagnosis not present

## 2017-08-22 DIAGNOSIS — M25512 Pain in left shoulder: Secondary | ICD-10-CM | POA: Diagnosis not present

## 2017-08-22 DIAGNOSIS — M62838 Other muscle spasm: Secondary | ICD-10-CM | POA: Diagnosis not present

## 2017-08-22 DIAGNOSIS — R52 Pain, unspecified: Secondary | ICD-10-CM | POA: Diagnosis not present

## 2017-08-22 DIAGNOSIS — I82509 Chronic embolism and thrombosis of unspecified deep veins of unspecified lower extremity: Secondary | ICD-10-CM | POA: Diagnosis not present

## 2017-08-22 DIAGNOSIS — G2581 Restless legs syndrome: Secondary | ICD-10-CM | POA: Diagnosis not present

## 2017-08-22 DIAGNOSIS — G35 Multiple sclerosis: Secondary | ICD-10-CM | POA: Diagnosis not present

## 2017-08-22 DIAGNOSIS — M48061 Spinal stenosis, lumbar region without neurogenic claudication: Secondary | ICD-10-CM | POA: Diagnosis not present

## 2017-08-22 DIAGNOSIS — S7002XA Contusion of left hip, initial encounter: Secondary | ICD-10-CM | POA: Diagnosis not present

## 2017-08-22 DIAGNOSIS — G629 Polyneuropathy, unspecified: Secondary | ICD-10-CM | POA: Diagnosis not present

## 2017-08-22 DIAGNOSIS — M25562 Pain in left knee: Secondary | ICD-10-CM | POA: Diagnosis not present

## 2017-08-22 DIAGNOSIS — M1712 Unilateral primary osteoarthritis, left knee: Secondary | ICD-10-CM | POA: Diagnosis not present

## 2017-08-22 DIAGNOSIS — E785 Hyperlipidemia, unspecified: Secondary | ICD-10-CM | POA: Diagnosis not present

## 2017-08-22 DIAGNOSIS — M359 Systemic involvement of connective tissue, unspecified: Secondary | ICD-10-CM | POA: Diagnosis not present

## 2017-08-22 DIAGNOSIS — M329 Systemic lupus erythematosus, unspecified: Secondary | ICD-10-CM | POA: Diagnosis not present

## 2017-08-22 DIAGNOSIS — W19XXXD Unspecified fall, subsequent encounter: Secondary | ICD-10-CM | POA: Diagnosis not present

## 2017-08-22 DIAGNOSIS — D699 Hemorrhagic condition, unspecified: Secondary | ICD-10-CM | POA: Diagnosis not present

## 2017-08-22 DIAGNOSIS — I2510 Atherosclerotic heart disease of native coronary artery without angina pectoris: Secondary | ICD-10-CM | POA: Diagnosis not present

## 2017-08-22 DIAGNOSIS — I82432 Acute embolism and thrombosis of left popliteal vein: Secondary | ICD-10-CM | POA: Diagnosis not present

## 2017-08-22 DIAGNOSIS — M25552 Pain in left hip: Secondary | ICD-10-CM | POA: Diagnosis not present

## 2017-08-22 DIAGNOSIS — S7002XD Contusion of left hip, subsequent encounter: Secondary | ICD-10-CM | POA: Diagnosis not present

## 2017-08-22 DIAGNOSIS — G894 Chronic pain syndrome: Secondary | ICD-10-CM | POA: Diagnosis not present

## 2017-08-22 DIAGNOSIS — I10 Essential (primary) hypertension: Secondary | ICD-10-CM | POA: Diagnosis not present

## 2017-08-22 DIAGNOSIS — H3530 Unspecified macular degeneration: Secondary | ICD-10-CM | POA: Diagnosis not present

## 2017-08-22 DIAGNOSIS — I825Y2 Chronic embolism and thrombosis of unspecified deep veins of left proximal lower extremity: Secondary | ICD-10-CM | POA: Diagnosis not present

## 2017-08-22 DIAGNOSIS — R918 Other nonspecific abnormal finding of lung field: Secondary | ICD-10-CM | POA: Diagnosis not present

## 2017-08-22 DIAGNOSIS — W1800XA Striking against unspecified object with subsequent fall, initial encounter: Secondary | ICD-10-CM | POA: Diagnosis not present

## 2017-08-22 DIAGNOSIS — I110 Hypertensive heart disease with heart failure: Secondary | ICD-10-CM | POA: Diagnosis not present

## 2017-08-22 DIAGNOSIS — I5032 Chronic diastolic (congestive) heart failure: Secondary | ICD-10-CM | POA: Diagnosis not present

## 2017-08-22 DIAGNOSIS — S20219D Contusion of unspecified front wall of thorax, subsequent encounter: Secondary | ICD-10-CM | POA: Diagnosis not present

## 2017-08-22 MED ORDER — TRAMADOL HCL 50 MG PO TABS: 50.0000 mg | ORAL_TABLET | Freq: Four times a day (QID) | ORAL | 0 refills | Status: DC | PRN

## 2017-08-22 MED ORDER — OXYCODONE-ACETAMINOPHEN 5-325 MG PO TABS: 1.0000 | ORAL_TABLET | Freq: Four times a day (QID) | ORAL | 0 refills | Status: DC | PRN

## 2017-08-22 NOTE — Progress Notes (Signed)
Patient is medically stable for D/C to [facility] today. Per [facility] admissions coordinator at [facility] patient can come today to room 408. RN will call report and arrange EMS for transport. [facility] authorization has been received. Clinical Education officer, museum (CSW) sent D/C orders to [facility] via [facility]. Patient is aware of above. CSW contacted patient's son [name] and left him a voicemail making him aware of above. Please reconsult if future social work needs arise. CSW signing off.   [employer], LCSW [phone]

## 2017-08-22 NOTE — Discharge Summary (Signed)
[place] [facility] [place] NAME: Cynthia Price    MR#:  [id number]  DATE OF BIRTH:  Dec 21, 1945  DATE OF ADMISSION:  08/19/2017 ADMITTING PHYSICIAN: Nicholes Mango, MD  DATE OF DISCHARGE: 08/22/2017  PRIMARY CARE PHYSICIAN: [name], MD    ADMISSION DIAGNOSIS:  Intractable pain [R52] Contusion of left hip, initial encounter [S70.02XA] Contusion of rib on left side, initial encounter [S20.212A]  DISCHARGE DIAGNOSIS:  Active Problems:   Intractable pain   SECONDARY DIAGNOSIS:   Past Medical History:  Diagnosis Date  . Anemia 2012   from labs at [facility]  . Back pain    s/p hemilaminectomy with h/o herniated disc at L4L5  . Blood clot in vein    behind left knee  . BP (high blood pressure) 03/08/2014   Last Assessment & Plan:  Blood pressure has been controlled without significant dizzyness or associated fatigue. Taking antihypertensives as directed without difficulty.    Marland Kitchen CAD (coronary artery disease) 10/18/2011  . Chronic diastolic heart failure ([place]) 09/07/2014   Last Assessment & Plan:  Edema and sob seemingly baseline   . Collagen vascular disease ([place])   . Depression    after husband died  . DVT (deep venous thrombosis) ([place]) 05/14/2012  . Essential thrombocytosis ([place])   . Hyperlipidemia   . 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[place]) 03/05/2015  . Hypotension 03/26/2013  . IBS (irritable bowel syndrome)   . Insomnia   . Lupus anticoagulant positive   . Macular degeneration   . MS (multiple sclerosis) ([place])   . Multiple sclerosis ([place]) 04/29/2008   Per neurology at [facility]    . NEUROPATHY 04/29/2008   Per [facility]rology at [facility]    . RLS (restless legs syndrome)   . Spinal stenosis    lumbar  . Urinary retention    with high post void residuals 2013, per [place]:   Cynthia Price a 71 y.o. femalewith a known history of MS, CHRONIC PAIN, history of DVT on Xarelto, depression, hyperlipidemia,  chronic diastolic congestive heart failure is present to the ED with intractable pain after she sustained a fall. Imaging studies did not reveal any broken bones but patient was unable to get out of the bed and ambulate as she was in severe pain and hospitalist team is called to admit the patient for pain management  #INTRACTABLE PAIN Admit patient to MedSurg unit Pain management as tolerated. Morphine IV for severe pain as needed   Added oral percocet. Pain is controlled now. PT consult- suggest SNF.  #Chronic history of multiple sclerosis Continue home medication baclofen for muscle relaxation Continue Neurontin, tramadol Continue her home medication Ampyra Appreciated neurology help, CT shows progression of disease.  Suggested to have CT cervical spine due to RUE symptoms, shows some neural compressions, but no spinal cord compromise. Neurologist suggested management by her primary care physician.  # RUE numbness   Neuro ordered evaluation of cervical spine.  #History of hyperlipidemia Not on home medications check fasting lipid panel  # Fall   No infection found yet.   Likely deconditioning due to her Age 71 and MS   PT suggest SNF.  #History of DVT continue Xarelto  DVT prophylaxis patient is on Xarelto  DISCHARGE CONDITIONS:   Stable.  CONSULTS OBTAINED:  Treatment Team:  Alexis Goodell, MD  DRUG ALLERGIES:   Allergies  Allergen Reactions  . Atorvastatin     Muscle aches at >40mg   DISCHARGE MEDICATIONS:   Current Discharge Medication List    START taking these medications   Details  oxyCODONE-acetaminophen (PERCOCET/ROXICET) 5-325 MG tablet Take 1 tablet by mouth every 6 (six) hours as needed for severe pain. Qty: 15 tablet, Refills: 0      CONTINUE these medications which have NOT CHANGED   Details  baclofen (LIORESAL) 10 MG tablet Take 10 mg by mouth 3 (three) times daily as needed.     gabapentin (NEURONTIN) 800 MG tablet Take 800 mg by  mouth 4 (four) times daily.     hydroxyurea (HYDREA) 500 MG capsule Take one by mouth Monday, Tuesday, Wed, Thursday, Friday Qty: 90 capsule, Refills: 3    rOPINIRole (REQUIP) 2 MG tablet Take 2 mg by mouth 4 (four) times daily.    traMADol (ULTRAM) 50 MG tablet Take 1 tablet (50 mg total) by mouth every 6 (six) hours as needed. Qty: 120 tablet, Refills: 12    XARELTO 20 MG TABS tablet Take 20 mg by mouth daily with breakfast.     AMPYRA 10 MG TB12 Take 10 mg by mouth 2 (two) times daily.       STOP taking these medications     trimethoprim (TRIMPEX) 100 MG tablet          DISCHARGE INSTRUCTIONS:   Follow with Your primary neurologist in office in 1-2 weeks.  If you experience worsening of your admission symptoms, develop shortness of breath, life threatening emergency, suicidal or homicidal thoughts you must seek medical attention immediately by calling 911 or calling your MD immediately  if symptoms less severe.  You Must read complete instructions/literature along with all the possible adverse reactions/side effects for all the Medicines you take and that have been prescribed to you. Take any new Medicines after you have completely understood and accept all the possible adverse reactions/side effects.   Please note  You were cared for by a hospitalist during your hospital stay. If you have any questions about your discharge medications or the care you received while you were in the hospital after you are discharged, you can call the unit and asked to speak with the hospitalist on call if the hospitalist that took care of you is not available. Once you are discharged, your primary care physician will handle any further medical issues. Please note that NO REFILLS for any discharge medications will be authorized once you are discharged, as it is imperative that you return to your primary care physician (or establish a relationship with a primary care physician if you do not have one)  for your aftercare needs so that they can reassess your need for medications and monitor your lab values.    Today   CHIEF COMPLAINT:   Chief Complaint  Patient presents with  . Fall    HISTORY OF PRESENT ILLNESS:  Cynthia Price  is a 71 y.o. female with a known history of MS, CHRONIC PAIN, history of DVT on Xarelto, depression, hyperlipidemia, chronic diastolic congestive heart failure is present to the ED with intractable pain after she sustained a mechanical fall. Imaging studies did not reveal any broken bones but patient was unable to get out of the bed and ambulate as she was in severe pain and hospitalist team is called to admit the patient for pain management. Patient is resting comfortably during my examination as she has received pain medicine. Her son is at bedside.  VITAL SIGNS:  Blood pressure (!) 118/56, pulse 89, temperature 98.4 F (36.9 C),  temperature source Oral, resp. rate 16, height 5\' 3"  (1.6 m), weight 56.7 kg (125 lb), SpO2 95 %.  I/O:   Intake/Output Summary (Last 24 hours) at 08/22/17 1527 Last data filed at 08/22/17 1328  Gross per 24 hour  Intake              240 ml  Output             1550 ml  Net            -1310 ml    PHYSICAL EXAMINATION:   GENERAL:  71 y.o.-year-old patient lying in the bed with acute distress due to generalized pain.  EYES: Pupils equal, round, reactive to light and accommodation. No scleral icterus. Extraocular muscles intact.  HEENT: Head atraumatic, normocephalic. Oropharynx and nasopharynx clear.  NECK:  Supple, no jugular venous distention. No thyroid enlargement, no tenderness.  LUNGS: Normal breath sounds bilaterally, no wheezing, rales,rhonchi or crepitation. No use of accessory muscles of respiration.  CARDIOVASCULAR: S1, S2 normal. No murmurs, rubs, or gallops.  ABDOMEN: Soft, nontender, nondistended. Bowel sounds present. No organomegaly or mass.  EXTREMITIES: No pedal edema, cyanosis, or clubbing.  NEUROLOGIC:  Cranial nerves II through XII are intact. Muscle strength 3-4/5 in all extremities. Sensation intact. Gait not checked.  PSYCHIATRIC: The patient is alert and oriented x 3.  SKIN: No obvious rash, lesion, or ulcer.   DATA REVIEW:   CBC  Recent Labs Lab 08/20/17 0650  WBC 4.5  HGB 10.4*  HCT 31.9*  PLT 316    Chemistries   Recent Labs Lab 08/19/17 1709 08/20/17 0650  NA 141 141  K 4.1 4.2  CL 104 106  CO2 28 28  GLUCOSE 99 96  BUN 24* 23*  CREATININE 0.81 0.78  CALCIUM 9.1 8.4*  AST 25  --   ALT 18  --   ALKPHOS 61  --   BILITOT 0.5  --     Cardiac Enzymes No results for input(s): TROPONINI in the last 168 hours.  Microbiology Results  Results for orders placed or performed in visit on 09/25/15  CULTURE, URINE COMPREHENSIVE     Status: None   Collection Time: 09/25/15 10:14 AM  Result Value Ref Range Status   Urine Culture, Comprehensive Final report  Final   Organism ID, Bacteria Comment  Final    Comment: No growth in 36 - 48 hours.  Microscopic Examination     Status: None   Collection Time: 09/25/15 10:20 AM  Result Value Ref Range Status   WBC, UA None seen 0 - 5 /hpf Final   RBC, UA None seen 0 - 2 /hpf Final   Epithelial Cells (non renal) None seen 0 - 10 /hpf Final   Renal Epithel, UA None seen None seen /hpf Final   Bacteria, UA None seen None seen/Few Final    RADIOLOGY:  Ct Head Wo Contrast  Result Date: 08/20/2017 CLINICAL DATA:  Fall.  History of multiple sclerosis. EXAM: CT HEAD WITHOUT CONTRAST TECHNIQUE: Contiguous axial images were obtained from the base of the skull through the vertex without intravenous contrast. COMPARISON:  Head CT 11/02/2014 FINDINGS: Brain: No mass lesion, intraparenchymal hemorrhage or extra-axial collection. No evidence of acute cortical infarct. Periventricular white matter disease has worsened compared to the prior study. Size and configuration of ventricles are unchanged. Vascular: No hyperdense vessel or  unexpected calcification. Skull: Normal visualized skull base, calvarium and extracranial soft tissues. Sinuses/Orbits: No sinus fluid levels or advanced mucosal thickening.  No mastoid effusion. Normal orbits. IMPRESSION: 1. Confluent white matter disease, progressed from the prior study and likely indicating worsening demyelinating disease. 2. No acute abnormality. Electronically Signed   By: Ulyses Jarred M.D.   On: 08/20/2017 22:20   Ct Cervical Spine Wo Contrast  Result Date: 08/21/2017 CLINICAL DATA:  Pain and weakness, fall. History of multiple sclerosis. EXAM: CT CERVICAL SPINE WITHOUT CONTRAST TECHNIQUE: Multidetector CT imaging of the cervical spine was performed without intravenous contrast. Multiplanar CT image reconstructions were also generated. COMPARISON:  None. FINDINGS: ALIGNMENT: Maintained lordosis. Vertebral bodies in alignment. SKULL BASE AND VERTEBRAE: Cervical vertebral bodies and posterior elements are intact. Moderate to severe C5-6 and C6-7 disc height loss with endplate sclerosis and marginal spurring compatible with degenerative discs, mild at C4-5. Osteopenia without destructive bony lesions. C1-2 articulation maintained with moderate arthropathy. SOFT TISSUES AND SPINAL CANAL: Normal. DISC LEVELS: No significant osseous canal stenosis. Multilevel mild to moderate neural foraminal narrowing. UPPER CHEST: Lung apices are clear. OTHER: None. IMPRESSION: No acute fracture deformity or malalignment. Electronically Signed   By: Elon Alas M.D.   On: 08/21/2017 20:15    EKG:   Orders placed or performed during the hospital encounter of 08/19/17  . ED EKG  . ED EKG  . EKG 12-Lead  . EKG 12-Lead      Management plans discussed with the patient, family and they are in agreement.  CODE STATUS:     Code Status Orders        Start     Ordered   08/19/17 2056  Do not attempt resuscitation (DNR)  Continuous    Question Answer Comment  In the event of cardiac or  respiratory ARREST Do not call a "code blue"   In the event of cardiac or respiratory ARREST Do not perform Intubation, CPR, defibrillation or ACLS   In the event of cardiac or respiratory ARREST Use medication by any route, position, wound care, and other measures to relive pain and suffering. May use oxygen, suction and manual treatment of airway obstruction as needed for comfort.   Comments RN may pronounce      08/19/17 2055    Code Status History    Date Active Date Inactive Code Status Order ID Comments User Context   This patient has a current code status but no historical code status.    Advance Directive Documentation     Most Recent Value  Type of Advance Directive  Living will  Pre-existing out of facility DNR order (yellow form or pink MOST form)  Yellow form placed in chart (order not valid for inpatient use)  "MOST" Form in Place?  -      TOTAL TIME TAKING CARE OF THIS PATIENT: 35 minutes.    Vaughan Basta M.D on 08/22/2017 at 3:27 PM  Between 7am to 6pm - Pager - [phone]  After 6pm go to [email or web address] - password [email or web address] [facility]  Office  [phone]  CC: Primary care physician; [name], MD   Note: This dictation was prepared with Dragon dictation along with smaller phrase technology. Any transcriptional errors that result from this process are unintentional.

## 2017-08-22 NOTE — Care Management (Signed)
Patient currently at 63 hours in observation status with discharge planning delay related to patient's co-pay to SNF. Patient has history of [facility] and had agreed to that on our discussion yesterday. I have notified [facility] of patient's potential need.

## 2017-08-22 NOTE — Progress Notes (Signed)
Clinical Social Worker (CSW) contacted patient's son Naiyah Klostermann and made him aware that [facility] is requesting $1,920 up front for patient's co-pays. Per [name] he will pay the co-pay up front and accepted the bed offer from [facility]. CSW explained long term care options to son including medicaid and private pay for SNF. CSW also discussed hospice options to son. Son is agreeable for patient to D/C to [facility] for short term rehab and pay the co-pays up front. [facility] admissions coordinator at [facility] is aware of above and will reach out to [place] regarding method of payment. Per [facility] Team SNF authorization has been received, auth # [id number]. Patient can D/C to [facility] when medically stable.   [employer], LCSW [phone]

## 2017-08-22 NOTE — Clinical Social Work Placement (Signed)
   CLINICAL SOCIAL WORK PLACEMENT  NOTE  Date:  08/22/2017  Patient Details  Name: Cynthia Price MRN: [id number] Date of Birth: 1946/05/22  Clinical Social Work is seeking post-discharge placement for this patient at the [place] level of care (*CSW will initial, date and re-position this form in  chart as items are completed):  Yes   Patient/family provided with [place] Work Department's list of facilities offering this level of care within the geographic area requested by the patient (or if unable, by the patient's family).  Yes   Patient/family informed of their freedom to choose among providers that offer the needed level of care, that participate in Medicare, Medicaid or managed care program needed by the patient, have an available bed and are willing to accept the patient.  Yes   Patient/family informed of [facility]'s ownership interest in [facility] and [facility] as well as of the fact that they are under no obligation to receive care at these facilities.  PASRR submitted to EDS on       PASRR number received on       Existing PASRR number confirmed on 08/21/17     FL2 transmitted to all facilities in geographic area requested by pt/family on 08/21/17     FL2 transmitted to all facilities within larger geographic area on       Patient informed that his/her managed care company has contracts with or will negotiate with certain facilities, including the following:        Yes   Patient/family informed of bed offers received.  Patient chooses bed at  [facility] )     Physician recommends and patient chooses bed at      Patient to be transferred to  [facility] ) on 08/22/17.  Patient to be transferred to facility by  [facility] )     Patient family notified on 08/22/17 of transfer.  Name of family member notified:   ([place] left patient's son [name] a voicemail making him aware of D/C today. )      PHYSICIAN       Additional Comment:    _______________________________________________ Quasim Doyon, Veronia Beets, LCSW 08/22/2017, 4:28 PM

## 2017-08-22 NOTE — Progress Notes (Signed)
Called report to Early Osmond LPN at [facility] .

## 2017-08-22 NOTE — Progress Notes (Signed)
Physical Therapy Treatment Patient Details Name: Cynthia Price MRN: [id number] DOB: 08-07-1946 Today's Date: 08/22/2017    History of Present Illness 71 yo female with onset of fall in SNF and basilar atelectasis noted upon admission.  Has been in severe pain with no fracture findings.  PMHx:  MS, chronic pain, DVT, CHF, falls, back pain, PN, RLS, spinal stenosis, pulm vascular disease, HTN, OA    PT Comments    Participated in exercises as described below.  Pt was able to tolerate increased activity today.  Sat edge of bed with mod/max a x 1.  She was able to initiate movement but unable to complete.  Once sitting she remained upright with supervision.  She attempted to stand x 3 with walker and mod assist but was only able to fully stand on third attempt.    Pt continues with R LE pain limiting her mobility.  During exercises and standing, RLE with "cramp" and draw up into hip/knee flexion.    SNF remains appropriate upon discharge.   Follow Up Recommendations  SNF     Equipment Recommendations  None recommended by PT    Recommendations for Other Services       Precautions / Restrictions Precautions Precautions: Fall Precaution Comments: pain sensitivity on LLE lower leg Restrictions Weight Bearing Restrictions: No Other Position/Activity Restrictions: back pain and RLE with movement    Mobility  Bed Mobility Overal bed mobility: Needs Assistance Bed Mobility: Supine to Sit;Sit to Supine     Supine to sit: Max assist Sit to supine: Max assist   General bed mobility comments: able to initiate movement but unable to complete without assist.  Transfers Overall transfer level: Needs assistance Equipment used: Rolling walker (2 wheeled) Transfers: Sit to/from Stand Sit to Stand: Mod assist         General transfer comment: stood x 3 - only stood fully on 3rd attempt  Ambulation/Gait             General Gait Details: deferred   Stairs             Wheelchair Mobility    Modified Rankin (Stroke Patients Only)       Balance Overall balance assessment: Needs assistance Sitting-balance support: Feet supported;Bilateral upper extremity supported Sitting balance-Leahy Scale: Poor     Standing balance support: Bilateral upper extremity supported Standing balance-Leahy Scale: Poor                              Cognition Arousal/Alertness: Awake/alert Behavior During Therapy: WFL for tasks assessed/performed Overall Cognitive Status: Within Functional Limits for tasks assessed                                        Exercises Other Exercises Other Exercises: BLE AAROM for BLE for ankle pumps, heel slides, SLR, ab/add x 10 RLE, to tolerance LLE due to pain    General Comments        Pertinent Vitals/Pain Pain Assessment: 0-10 Faces Pain Scale: Hurts worst Pain Location: reports minimal pain at rest, 10/10 with movement Pain Descriptors / Indicators: Sore;Cramping Pain Intervention(s): Limited activity within patient's tolerance;Monitored during session    Home Living                      Prior Function  PT Goals (current goals can now be found in the care plan section) Progress towards PT goals: Progressing toward goals    Frequency    Min 2X/week      PT Plan Current plan remains appropriate    Co-evaluation              AM-PAC PT "6 Clicks" Daily Activity  Outcome Measure  Difficulty turning over in bed (including adjusting bedclothes, sheets and blankets)?: Unable Difficulty moving from lying on back to sitting on the side of the bed? : Unable Difficulty sitting down on and standing up from a chair with arms (e.g., wheelchair, bedside commode, etc,.)?: Unable Help needed moving to and from a bed to chair (including a wheelchair)?: Total Help needed walking in hospital room?: Total Help needed climbing 3-5 steps with a railing? : Total 6 Click  Score: 6    End of Session Equipment Utilized During Treatment: Oxygen   Patient left: in bed;with call bell/phone within reach;with bed alarm set Nurse Communication: Other (comment)       Time: 8270-7867 PT Time Calculation (min) (ACUTE ONLY): 24 min  Charges:  $Therapeutic Exercise: 8-22 mins $Therapeutic Activity: 8-22 mins                    G Codes:       Chesley Noon, PTA 08/22/17, 12:13 PM

## 2017-08-22 NOTE — Progress Notes (Signed)
[place] [facility] [place] NAME: Cynthia Price    MR#:  [id number]  DATE OF BIRTH:  1946/09/07  SUBJECTIVE:  CHIEF COMPLAINT:   Chief Complaint  Patient presents with  . Fall     Have MS, weakness- had a fall and pain , so brought to ER, Was recently in rehab, overall pain and weakness. Today c/o pain and numbness in RUE since the fall.  REVIEW OF SYSTEMS:  CONSTITUTIONAL: No fever, positive for fatigue or weakness.  EYES: No blurred or double vision.  EARS, NOSE, AND THROAT: No tinnitus or ear pain.  RESPIRATORY: No cough, shortness of breath, wheezing or hemoptysis.  CARDIOVASCULAR: No chest pain, orthopnea, edema.  GASTROINTESTINAL: No nausea, vomiting, diarrhea or abdominal pain.  GENITOURINARY: No dysuria, hematuria.  ENDOCRINE: No polyuria, nocturia,  HEMATOLOGY: No anemia, easy bruising or bleeding SKIN: No rash or lesion. MUSCULOSKELETAL: No joint pain or arthritis.   NEUROLOGIC: No tingling, numbness, weakness.  PSYCHIATRY: No anxiety or depression.   ROS  DRUG ALLERGIES:   Allergies  Allergen Reactions  . Atorvastatin     Muscle aches at >40mg      VITALS:  Blood pressure (!) 107/49, pulse 80, temperature 98.2 F (36.8 C), resp. rate 20, height 5\' 3"  (1.6 m), weight 56.7 kg (125 lb), SpO2 94 %.  PHYSICAL EXAMINATION:  GENERAL:  71 y.o.-year-old patient lying in the bed with acute distress due to generalized pain.  EYES: Pupils equal, round, reactive to light and accommodation. No scleral icterus. Extraocular muscles intact.  HEENT: Head atraumatic, normocephalic. Oropharynx and nasopharynx clear.  NECK:  Supple, no jugular venous distention. No thyroid enlargement, no tenderness.  LUNGS: Normal breath sounds bilaterally, no wheezing, rales,rhonchi or crepitation. No use of accessory muscles of respiration.  CARDIOVASCULAR: S1, S2 normal. No murmurs, rubs, or gallops.  ABDOMEN: Soft, nontender, nondistended. Bowel sounds  present. No organomegaly or mass.  EXTREMITIES: No pedal edema, cyanosis, or clubbing.  NEUROLOGIC: Cranial nerves II through XII are intact. Muscle strength 3-4/5 in all extremities. Sensation intact. Gait not checked.  PSYCHIATRIC: The patient is alert and oriented x 3.  SKIN: No obvious rash, lesion, or ulcer.   Physical Exam LABORATORY PANEL:   CBC  Recent Labs Lab 08/20/17 0650  WBC 4.5  HGB 10.4*  HCT 31.9*  PLT 316   ------------------------------------------------------------------------------------------------------------------  Chemistries   Recent Labs Lab 08/19/17 1709 08/20/17 0650  NA 141 141  K 4.1 4.2  CL 104 106  CO2 28 28  GLUCOSE 99 96  BUN 24* 23*  CREATININE 0.81 0.78  CALCIUM 9.1 8.4*  AST 25  --   ALT 18  --   ALKPHOS 61  --   BILITOT 0.5  --    ------------------------------------------------------------------------------------------------------------------  Cardiac Enzymes No results for input(s): TROPONINI in the last 168 hours. ------------------------------------------------------------------------------------------------------------------  RADIOLOGY:  Ct Head Wo Contrast  Result Date: 08/20/2017 CLINICAL DATA:  Fall.  History of multiple sclerosis. EXAM: CT HEAD WITHOUT CONTRAST TECHNIQUE: Contiguous axial images were obtained from the base of the skull through the vertex without intravenous contrast. COMPARISON:  Head CT 11/02/2014 FINDINGS: Brain: No mass lesion, intraparenchymal hemorrhage or extra-axial collection. No evidence of acute cortical infarct. Periventricular white matter disease has worsened compared to the prior study. Size and configuration of ventricles are unchanged. Vascular: No hyperdense vessel or unexpected calcification. Skull: Normal visualized skull base, calvarium and extracranial soft tissues. Sinuses/Orbits: No sinus fluid levels or advanced mucosal thickening. No mastoid  effusion. Normal orbits. IMPRESSION: 1.  Confluent white matter disease, progressed from the prior study and likely indicating worsening demyelinating disease. 2. No acute abnormality. Electronically Signed   By: Ulyses Jarred M.D.   On: 08/20/2017 22:20   Ct Cervical Spine Wo Contrast  Result Date: 08/21/2017 CLINICAL DATA:  Pain and weakness, fall. History of multiple sclerosis. EXAM: CT CERVICAL SPINE WITHOUT CONTRAST TECHNIQUE: Multidetector CT imaging of the cervical spine was performed without intravenous contrast. Multiplanar CT image reconstructions were also generated. COMPARISON:  None. FINDINGS: ALIGNMENT: Maintained lordosis. Vertebral bodies in alignment. SKULL BASE AND VERTEBRAE: Cervical vertebral bodies and posterior elements are intact. Moderate to severe C5-6 and C6-7 disc height loss with endplate sclerosis and marginal spurring compatible with degenerative discs, mild at C4-5. Osteopenia without destructive bony lesions. C1-2 articulation maintained with moderate arthropathy. SOFT TISSUES AND SPINAL CANAL: Normal. DISC LEVELS: No significant osseous canal stenosis. Multilevel mild to moderate neural foraminal narrowing. UPPER CHEST: Lung apices are clear. OTHER: None. IMPRESSION: No acute fracture deformity or malalignment. Electronically Signed   By: Elon Alas M.D.   On: 08/21/2017 20:15    ASSESSMENT AND PLAN:   Active Problems:   Intractable pain  Cynthia Price  is a 71 y.o. female with a known history of  MS, CHRONIC PAIN, history of DVT on Xarelto, depression, hyperlipidemia, chronic diastolic congestive heart failure is present to the ED with intractable pain after she sustained a fall. Imaging studies did not reveal any broken bones but patient was unable to get out of the bed and ambulate as she was in severe pain and hospitalist team is called to admit the patient for pain management  #INTRACTABLE PAIN Admit patient to MedSurg unit Pain management as tolerated. Morphine IV for severe pain as needed    Added oral percocet. PT consult- suggest SNF.  #Chronic history of multiple sclerosis Continue home medication baclofen for muscle relaxation Continue Neurontin, tramadol Continue her home medication Ampyra Awaited neurology help, CT shows progression of disease.  # RUE numbness   Neuro ordered evaluation of cervical spine.  #History of hyperlipidemia Not on home medications check fasting lipid panel  # Fall   No infection found yet.   Likely deconditioning due to her Age and MS   PT suggest SNF.  #History of DVT continue Xarelto  DVT prophylaxis patient is on Xarelto   All the records are reviewed and case discussed with Care Management/Social Workerr. Management plans discussed with the patient, family and they are in agreement.  CODE STATUS: full.  TOTAL TIME TAKING CARE OF THIS PATIENT: 35 minutes.   POSSIBLE D/C IN 1-2 DAYS, DEPENDING ON CLINICAL CONDITION.   Vaughan Basta M.D on 08/22/2017   Between 7am to 6pm - Pager - [phone]  After 6pm go to [email or web address] - password [email or web address] [place] [facility]  Office  [phone]  CC: Primary care physician; [name], MD  Note: This dictation was prepared with Dragon dictation along with smaller phrase technology. Any transcriptional errors that result from this process are unintentional.

## 2017-08-22 NOTE — Care Management (Signed)
RNCM received call from patient stating that she would like to pay co-pay for [facility]. CSW notified.

## 2017-08-23 DIAGNOSIS — I5032 Chronic diastolic (congestive) heart failure: Secondary | ICD-10-CM | POA: Diagnosis not present

## 2017-08-23 DIAGNOSIS — W1800XA Striking against unspecified object with subsequent fall, initial encounter: Secondary | ICD-10-CM | POA: Diagnosis not present

## 2017-08-23 DIAGNOSIS — I825Y2 Chronic embolism and thrombosis of unspecified deep veins of left proximal lower extremity: Secondary | ICD-10-CM | POA: Diagnosis not present

## 2017-08-23 DIAGNOSIS — G35 Multiple sclerosis: Secondary | ICD-10-CM | POA: Diagnosis not present

## 2017-08-24 ENCOUNTER — Other Ambulatory Visit: Payer: Self-pay | Admitting: *Deleted

## 2017-08-24 DIAGNOSIS — G35 Multiple sclerosis: Secondary | ICD-10-CM

## 2017-08-24 NOTE — Patient Outreach (Signed)
[facility]) Care Management  08/24/2017  Magdalyn Arenivas 1946/10/19 [id number]   Met with [name], SW at facility.  She states that she will meet with patient soon, but son has indicated he may want to look at ALF for patient.   Spoke briefly with patient, she wants to speak with SW re: finances.  RNCM let Tiffany know patient wants to speak with her about finances.  Plan to refer to [facility] LCSW to follow while at facility. May need some assistance with ALF placement.  Will let [facility] RNCM know that patient is at facility.   [name]. [name], RN, BSN, [place] [phone]) Business Cell  [phone]) Toll Free Office

## 2017-08-25 ENCOUNTER — Other Ambulatory Visit: Payer: Self-pay | Admitting: *Deleted

## 2017-08-25 ENCOUNTER — Ambulatory Visit: Payer: Self-pay | Admitting: *Deleted

## 2017-08-25 NOTE — Patient Outreach (Signed)
Late entry: 08/24/17 at 3:42 pm- Received a call from [facility] coordinator informing this RN CM  did a brief visit with pt at [facility], plans to refer [facility] LCSW to follow up with pt while at [facility].    This RN CM was following pt for transition of care (SNF discharge 08/09/17) prior to hospitalization September 22-25,2018 for intractable pain (result of fall at home), contusion left hip,contusion rib of left side.    Plan:  RN CM to follow up with pt at discharge from SNF.               [name].   [place] Care Management  [phone]

## 2017-08-29 DIAGNOSIS — I10 Essential (primary) hypertension: Secondary | ICD-10-CM | POA: Diagnosis not present

## 2017-08-29 DIAGNOSIS — I82432 Acute embolism and thrombosis of left popliteal vein: Secondary | ICD-10-CM | POA: Diagnosis not present

## 2017-08-29 DIAGNOSIS — M1712 Unilateral primary osteoarthritis, left knee: Secondary | ICD-10-CM | POA: Diagnosis not present

## 2017-08-29 DIAGNOSIS — G35 Multiple sclerosis: Secondary | ICD-10-CM | POA: Diagnosis not present

## 2017-08-31 ENCOUNTER — Ambulatory Visit: Payer: Self-pay | Admitting: *Deleted

## 2017-09-05 ENCOUNTER — Other Ambulatory Visit: Payer: Self-pay | Admitting: *Deleted

## 2017-09-05 ENCOUNTER — Ambulatory Visit: Payer: Self-pay | Admitting: *Deleted

## 2017-09-05 NOTE — Patient Outreach (Signed)
[facility] [facility]) Care Management  09/05/2017  Dailyn Reith 1946/06/10 [id number]   Phone call to [place] discharge planner [name] to follow up on patient's progress in rehab. Voicemail message left requesting a return call.     Sheralyn Boatman [facility] Care Management [phone]

## 2017-09-06 ENCOUNTER — Other Ambulatory Visit: Payer: Self-pay | Admitting: *Deleted

## 2017-09-06 NOTE — Patient Outreach (Signed)
Follow up call made to see if pt discharged from [facility] as this RN CM viewed in [place] pt called  PCP office 08/31/17 with a medication issue/ requested give son a call back.  Spoke with son [name] (on consent form), HIPAA identifiers verified on pt, informed this RN CM pt currently in rehab([facility]) in Room 408.   Plan:  RN CM to inform [place] LCSW pt remains at [facility].             Plan to follow up with pt at discharge from SNF.    [name].   [place] Care Management  [phone]

## 2017-09-07 ENCOUNTER — Other Ambulatory Visit: Payer: Self-pay | Admitting: *Deleted

## 2017-09-07 DIAGNOSIS — D699 Hemorrhagic condition, unspecified: Secondary | ICD-10-CM | POA: Diagnosis not present

## 2017-09-07 NOTE — Patient Outreach (Signed)
[facility]) Care Management  09/07/2017  Cynthia Price 14-Mar-1946 [id number]   Spoke with staff at facility, Patient will discharge home 09/08/17. Plan RNCM will let [facility] care team know of discharge plans. [name]. [name], RN, BSN, [place] [phone]) Business Cell  [phone]) Toll Free Office

## 2017-09-07 NOTE — Patient Outreach (Signed)
  [name]) Care Management  [facility] Social Work  09/07/2017  Cynthia Price [birth year] [id number]  Subjective:  Patient is a 71 year old female currently in rehab at [facility]. Patient was not in the facility at thet ime of this social worker's visit. Per her nurse, she had been taken to a doctor's appointment.  This social worker spoke to [employer], Diplomatic Services operational officer of Business affairs who confirmed that patient will be discharging home tomorrow with [place] through [place].  Objective:   Encounter Medications:  Outpatient Encounter Prescriptions as of 09/07/2017  Medication Sig  . AMPYRA 10 MG TB12 Take 10 mg by mouth 2 (two) times daily.   . baclofen (LIORESAL) 10 MG tablet Take 10 mg by mouth 3 (three) times daily as needed.   . gabapentin (NEURONTIN) 800 MG tablet Take 800 mg by mouth 4 (four) times daily.   . hydroxyurea (HYDREA) 500 MG capsule Take one by mouth Monday, Tuesday, Wed, Thursday, Friday  . oxyCODONE-acetaminophen (PERCOCET/ROXICET) 5-325 MG tablet Take 1 tablet by mouth every 6 (six) hours as needed for severe pain.  Marland Kitchen rOPINIRole (REQUIP) 2 MG tablet Take 2 mg by mouth 4 (four) times daily.  . traMADol (ULTRAM) 50 MG tablet Take 1 tablet (50 mg total) by mouth every 6 (six) hours as needed.  Alveda Reasons 20 MG TABS tablet Take 20 mg by mouth daily with breakfast.    No facility-administered encounter medications on file as of 09/07/2017.     Functional Status:  In your present state of health, do you have any difficulty performing the following activities: 08/19/2017 08/18/2017  Hearing? N N  Vision? Y N  Difficulty concentrating or making decisions? N N  Walking or climbing stairs? Y Y  Dressing or bathing? N N  Doing errands, shopping? Tempie Donning  Preparing Food and eating ? - Y  Using the Toilet? - N  In the past six months, have you accidently leaked urine? - Y  Do you have problems with loss of bowel control? - N  Managing your Medications? - N   Managing your Finances? - N  Housekeeping or managing your Housekeeping? - Y  Some recent data might be hidden    Fall/Depression Screening:  PHQ 2/9 Scores 08/18/2017  PHQ - 2 Score 2  PHQ- 9 Score 4    Assessment:  Patient not seen by this social worker as patient was at a doctor's appointment when this social worker arrived.  However is was confirmed that patient will discharge tomorrow back home with [facility] through [facility].  Plan: This Education officer, museum will follow up with patient to assess for community resource needs following her discharge from [facility].    Sheralyn Boatman [facility] Care Management [phone]

## 2017-09-08 ENCOUNTER — Other Ambulatory Visit: Payer: Self-pay | Admitting: *Deleted

## 2017-09-08 NOTE — Patient Outreach (Signed)
Unsuccessful telephone encounter to [name], 71 year old female.   This RN CM was informed by Burgess Amor RN CM post acute care coordinator 09/07/17  That pt  will be discharging home today from [facility] with home health.   Also view in Epic, [place] LCSW note Actuary of business affairs confirmed pt will be discharging home 10/12 with [place] through [facility].  Pt's recent hospitalization September 22-24,2018 for intractable pain, then admitted to [facility].  HIPAA compliant voice messages left on pt's home phone as well as son Erik's cell phone [name] lives with pt, on consent form).   Plan:  If no response to voice messages left, Plan to request coworker Richarda Osmond RN CM covering for this RN CM next week to follow up telephonically.   [name].   [place] Care Management  [phone]

## 2017-09-11 ENCOUNTER — Ambulatory Visit: Payer: Self-pay | Admitting: *Deleted

## 2017-09-11 ENCOUNTER — Other Ambulatory Visit: Payer: Self-pay | Admitting: *Deleted

## 2017-09-11 NOTE — Patient Outreach (Signed)
[facility]) Care Management [place] Telephone Outreach, Transition of Care day 1  09/11/2017  Aman Batley 1946/09/12 [id number]  Successful telephone outreach to [name], 71 y/o female referred to [place] for transition of care by [facility] post-acute facilitator RN CM, after recent hospitalization September 22-25, 2018 for intractable pain following fall in which patient incurred (L) hip and (L) rib cage contusions.  Patient was discharged to SNF at [facility] for rehabilitation post-hospital discharge and was subsequently discharged home from [facility] on September 08, 2017 to self-care with home health services for PT in place through [facility] home health agency.  Patient has history including, but not limited to, MS, chronic pain, previous DVT (on anticoagulant therapy), dCHF, and depression.  HIPAA/ identity verified with patient during 30 minute phone call today, and patient provides verbal consent for ongoing [place] involvement in her care post- SNF discharge.  Today, patient reports that she "is doing okay" now that she is home from SNF.  Reports chronic/ ongoing pain in her (L) knee, which she states is "controlled pretty good" with prescribed and OTC medications; reports currently using OTC lidocaine patches in addition to tramadol.  Reports pain is "3-4/ 10" during time of phone call while she is at rest, states pain increases to "8/10" with activity.  Patient sounds to be in no apparent/ obvious distress throughout entirety of today's phone call.  I explained that I was contacting patient for primary [facility] RN CM, and she verbalizes understanding.   Patient further reports:  Medications: -- Has all medicationsand takes as prescribed;denies questions about current medications.  -- Verbalizes good general understanding of the purpose, dosing, and scheduling of medications.   -- self-manage medications and denies issues with swallowing  medications -- patient was recently discharged from SNF and all medications were thoroughly reviewed with patient today during phone call  Home health [facility]) services: -- [facility] services for PT [facility] through [facility] agency -- reports that she has not yet heard from [facility] agency, but expects a call soon, as she "was just discharged home on Friday." -- confirms that she has phone number for [facility] agency, stating that this is the same [place] that visited her prior to her last hospitalization -- encouraged patient to contact [facility] agency tomorrow to initiate services if she has not heard from someone at company by tomorrow, and she verbalizes understanding and agreement with this plan   Provider appointments: -- reports that she attended post-SNF hematology at [facility] on Friday September 08, 2017; reports "no changes" in overall plan of care as a result of that visit -- reports does not yet have scheduled appointment with PCP for post-hospital/ SNF discharge evaluation; encouraged patient to schedule this appointment as soon as possible -- discussed need for patient to obtain flu shot for upcoming cold season, and she verbalizes agreement; patient agrees to schedule PCP appointment "this week" -- discussed alternate options to obtain flu shot, including her pharmacy, but stressed that patient would benefit from prompt PCP appointment after her recent hospitalizations.   Safety/ Mobility/ Falls: -- denies new/ recent falls post- SNF discharge -- assistive devices: cane, walker, wheelchair, states "mainly using wheelchair" since she has been home from SNF -- general fall risks/ prevention education discussed with patient today  Social/ Community Resource needs: -- patient states she has supportive family members that assist with care needs as indicated; reports lives with son who regularly assists/ participates in her daily  care as needed -- family provides transportation for patient to all provider  appointments, errands, etc -- acknowledges that she continues working with [place]  Advanced Directive (AD) Planning:   --reports has living will and declines desire today to make changes  Patient denies further issues, concerns, or problems today.  I provided patient with my direct phone number should she has questions or problems this week, and confirmed that patient has primary [facility] RN CM phone number as well as the main [place] office phone number, and the [facility] CM 24-hour nurse advice phone number should issues arise prior to next scheduled [place] outreach.  Explained that primary [facility] RN CM would contact patient by phone next week for ongoing transition of care and patient verbalized understanding and agreement with this plan.  Plan:  Patient will take medications as prescribed and will promptly schedule PCP office visit post- SNF discharge  Patient will continue using assistive devices for fall prevention as indicated  Patient will contact home health agency if she has not heard from them to schedule home visit; patient will activelt participate in [facility] services as ordered post- SNF discharge  I will update patient's primary [facility] RN CM of today's successful telephone outreach to patient for initiation of transition of care  Oneta Rack, RN, BSN, [place] Care Management  [phone]

## 2017-09-13 ENCOUNTER — Other Ambulatory Visit: Payer: Self-pay | Admitting: *Deleted

## 2017-09-13 NOTE — Patient Outreach (Signed)
[facility]) Care Management  09/13/2017  Cynthia Price [birth year] [id number]    Referral received from HTA to assist patient with transition home from the SNF and to discuss level of care needs. Per patient, she currently rents a home with her son who is on disability. He, however is able to assist her with food, household needs and transportation to the doctor. Patient states that she will eventually needs more hands on care and has been speaking with her daughter who lives in [place] regarding arranging a in home care aid for patient. Patient's daughter helps as much as she can, but she lives and works full time in [place].  Per patient, her finances are limited, she has applied for Medicaid in the past but  is not eligible. Patient looking to change insurance coverage due to the co-pay requirement for [facility] services through current insurance provider.  Contact number for the Senior's Health Information program offered, however patient stated that she did not have a pen and would get the number through a friend of hers.  Patient states that her main problem is getting out of the door to her mode of transpotration to get to her [place] appointments, She has a new wheelchair and is getting familiar with it's use at this time. Patient states that she has one step leading up to her home that is difficult to navigate. Her church has agreed to assist with getting a temporary wheelchair ramp built, however she is waiting on the [place]  to send her the necessary paperwork to sign in order to get the temporary ramp built. Per patient, she just spoke with them yesterday.  Patient states that for right now, she would like to remain in her own home, however is aware that she may need facility care as her medical needs increase. She is scheduled for knee surgery on 09/26/17 and understands that she may have to return to a SNF for rehab.    Plan: This Education officer, museum will follow up  with patient within 2 weeks regarding the temporary wheelchair ramp and arrangements made for in home care.   Sheralyn Boatman [facility] Care Management [phone]

## 2017-09-18 DIAGNOSIS — I82432 Acute embolism and thrombosis of left popliteal vein: Secondary | ICD-10-CM | POA: Diagnosis not present

## 2017-09-18 DIAGNOSIS — I10 Essential (primary) hypertension: Secondary | ICD-10-CM | POA: Diagnosis not present

## 2017-09-18 DIAGNOSIS — I272 Pulmonary hypertension, unspecified: Secondary | ICD-10-CM | POA: Diagnosis not present

## 2017-09-18 DIAGNOSIS — N319 Neuromuscular dysfunction of bladder, unspecified: Secondary | ICD-10-CM | POA: Diagnosis not present

## 2017-09-18 DIAGNOSIS — I5032 Chronic diastolic (congestive) heart failure: Secondary | ICD-10-CM | POA: Diagnosis not present

## 2017-09-18 DIAGNOSIS — I825Y2 Chronic embolism and thrombosis of unspecified deep veins of left proximal lower extremity: Secondary | ICD-10-CM | POA: Diagnosis not present

## 2017-09-18 DIAGNOSIS — E782 Mixed hyperlipidemia: Secondary | ICD-10-CM | POA: Diagnosis not present

## 2017-09-18 DIAGNOSIS — G35 Multiple sclerosis: Secondary | ICD-10-CM | POA: Diagnosis not present

## 2017-09-18 DIAGNOSIS — D473 Essential (hemorrhagic) thrombocythemia: Secondary | ICD-10-CM | POA: Diagnosis not present

## 2017-09-18 DIAGNOSIS — Z01818 Encounter for other preprocedural examination: Secondary | ICD-10-CM | POA: Diagnosis not present

## 2017-09-18 DIAGNOSIS — N390 Urinary tract infection, site not specified: Secondary | ICD-10-CM | POA: Diagnosis not present

## 2017-09-18 DIAGNOSIS — M1712 Unilateral primary osteoarthritis, left knee: Secondary | ICD-10-CM | POA: Diagnosis not present

## 2017-09-18 DIAGNOSIS — R569 Unspecified convulsions: Secondary | ICD-10-CM | POA: Diagnosis not present

## 2017-09-18 DIAGNOSIS — I825Z2 Chronic embolism and thrombosis of unspecified deep veins of left distal lower extremity: Secondary | ICD-10-CM | POA: Diagnosis not present

## 2017-09-18 DIAGNOSIS — G2581 Restless legs syndrome: Secondary | ICD-10-CM | POA: Diagnosis not present

## 2017-09-19 ENCOUNTER — Other Ambulatory Visit: Payer: Self-pay | Admitting: *Deleted

## 2017-09-19 NOTE — Patient Outreach (Signed)
3:36 pm Unsuccessful telephone encounter to Cynthia Price, 71 year old female for transition of care/ongoing follow up on recent SNF discharge 09/08/17, admitted to rehab after recent hospitalization September 22-25,2018 for intractable pain result of fall/pt occurred left hip/left rib cage contusions.   HIPAA compliant voice message left with contact name and number.   3:40 pm  Successful telephone encounter with Cynthia Price, 71 year old female as pt returned phone call.   Spoke with pt, discussed purpose of call for transition of care/ongoing follow up on recent SNF discharge 09/08/17, admitted to rehab after recent hospitalization September 22-25,2018 for intractable pain following fall  Resulting in left hip/left rib cage contusions.  HIPAA identifiers verified by pt during call.  Pt reports been trying to get up with MD about upcoming left knee surgery, scheduled with Dr Lorri Frederick at [facility] on 09/26/17.   Pt reports since discharge home, not doing good/pain in left knee/medication helps a little/needs surgery, to have total  Replacement.   Pt reports not getting HH PT, currently on hold until she really needs it as cannot afford the $25 co pay each visit.  Pt reports no falls, using her wheelchair.  Pt reports taking all of her medications as ordered, did not schedule a follow up appointment with PCP, don't have time,thing about upcoming surgery.   Plan:  As discussed with pt, plan to follow up again next week telephonically before surgery date/check on status.     [name].   [place] Care Management  [phone]

## 2017-09-19 NOTE — Patient Outreach (Signed)
Successful telephone encounter with Cynthia Price, 71 year old female for transition of care/recent SNF discharge 09/08/17.   Spoke with pt who requested RN CM to call her back in 15 minutes/HIPAA not provided.     Plan:  As requested by pt, plan to follow up again today telephonically.    [name].   [place] Care Management  [phone]

## 2017-09-25 ENCOUNTER — Other Ambulatory Visit: Payer: Self-pay | Admitting: *Deleted

## 2017-09-25 NOTE — Patient Outreach (Signed)
[facility]) Care Management  09/25/2017  Delani Kohli 03-21-1946 [id number]    Phone call to patient to follow up on her temporary wheelchair ramp and arrangements made for in home care.  Per patient, she is preparing for total knee replacement  surgery tomorrow. Per patient she expects to go to rehab following her surgery. Patient states that she has received the paperwork to sign from her management company for the temporary wheelchair ramp, however has not had a chance to complete and return the paperwork. This Education officer, museum strongly recommended completing the paperwork in order for the temp ramp to be installed before she returns home from rehab. Patient states that she wil leave the paperwork with her son to complete in her absence. Per patient her son and her church family are her primary support, mainly her son.   Plan: This Education officer, museum will follow up with patient regarding the wheelchair ramp and support following her knee surgery.    Sheralyn Boatman [facility] Care Management [phone]

## 2017-09-26 DIAGNOSIS — D473 Essential (hemorrhagic) thrombocythemia: Secondary | ICD-10-CM | POA: Diagnosis not present

## 2017-09-26 DIAGNOSIS — R601 Generalized edema: Secondary | ICD-10-CM | POA: Diagnosis not present

## 2017-09-26 DIAGNOSIS — M1712 Unilateral primary osteoarthritis, left knee: Secondary | ICD-10-CM | POA: Diagnosis not present

## 2017-09-26 DIAGNOSIS — I824Y2 Acute embolism and thrombosis of unspecified deep veins of left proximal lower extremity: Secondary | ICD-10-CM | POA: Diagnosis not present

## 2017-09-26 DIAGNOSIS — R609 Edema, unspecified: Secondary | ICD-10-CM | POA: Diagnosis not present

## 2017-09-26 DIAGNOSIS — I5032 Chronic diastolic (congestive) heart failure: Secondary | ICD-10-CM | POA: Diagnosis not present

## 2017-09-26 DIAGNOSIS — G2581 Restless legs syndrome: Secondary | ICD-10-CM | POA: Diagnosis not present

## 2017-09-26 DIAGNOSIS — G35 Multiple sclerosis: Secondary | ICD-10-CM | POA: Diagnosis not present

## 2017-09-26 DIAGNOSIS — I82432 Acute embolism and thrombosis of left popliteal vein: Secondary | ICD-10-CM | POA: Diagnosis not present

## 2017-09-26 DIAGNOSIS — I82532 Chronic embolism and thrombosis of left popliteal vein: Secondary | ICD-10-CM | POA: Diagnosis not present

## 2017-09-26 DIAGNOSIS — I10 Essential (primary) hypertension: Secondary | ICD-10-CM | POA: Diagnosis not present

## 2017-09-26 DIAGNOSIS — I82412 Acute embolism and thrombosis of left femoral vein: Secondary | ICD-10-CM | POA: Diagnosis not present

## 2017-09-29 DIAGNOSIS — I8290 Acute embolism and thrombosis of unspecified vein: Secondary | ICD-10-CM | POA: Diagnosis not present

## 2017-10-05 ENCOUNTER — Other Ambulatory Visit: Payer: Self-pay | Admitting: *Deleted

## 2017-10-05 NOTE — Patient Outreach (Signed)
Successful telephone encounter to Cynthia Price, 71 year old female for transition of care/ongoing follow up on recent SNF discharge 09/08/17, admitted to rehab after recent hospitalization September 22-25,2018 for intractable pain- result of fall, pt occurred left hip/left rib cage contusions.  Spoke with pt,  HIPAA identifiers verified.  Pt reports she could not have her knee (left) surgery, trying to get rid of blood clot, making it/struggling on one leg,no falls, son helping out more. Pt reports let Home health know she could not afford their services (PT, RN).    Pt reports son is giving her medications, includes doing Lovenox injections twice a day, brings her meals to bedroom.  Pt reports she went to [facility] so many times, cancelled appointment with PCP, plan to call and reschedule/try to get someone to take me.    Plan:   As discussed with pt, plan to follow up again next week - home visit (part of ongoing transition of care).     [name].   [place] Care Management  [phone]

## 2017-10-06 ENCOUNTER — Other Ambulatory Visit: Payer: Self-pay | Admitting: *Deleted

## 2017-10-09 ENCOUNTER — Encounter: Payer: Self-pay | Admitting: *Deleted

## 2017-10-09 DIAGNOSIS — M1712 Unilateral primary osteoarthritis, left knee: Secondary | ICD-10-CM | POA: Diagnosis not present

## 2017-10-09 DIAGNOSIS — I825Y2 Chronic embolism and thrombosis of unspecified deep veins of left proximal lower extremity: Secondary | ICD-10-CM | POA: Diagnosis not present

## 2017-10-09 DIAGNOSIS — D473 Essential (hemorrhagic) thrombocythemia: Secondary | ICD-10-CM | POA: Diagnosis not present

## 2017-10-09 DIAGNOSIS — I5032 Chronic diastolic (congestive) heart failure: Secondary | ICD-10-CM | POA: Diagnosis not present

## 2017-10-09 DIAGNOSIS — Z23 Encounter for immunization: Secondary | ICD-10-CM | POA: Diagnosis not present

## 2017-10-09 NOTE — Patient Outreach (Addendum)
[facility]) Care Management  10/09/2017  Cynthia Price 1946-04-06 [id number]   Phone call to patient on 10/06/17 to follow up on paper work that needed to be signed in order for the management company for her apartment  to authorize the installation of her wheelchair ramp.  Per patient, her church will install the ramp when all of the paperwork is ready.  Patient  has the paper work but just has to sign it and send it back. Per patient, she will be able to get this done on her own.   Patient verbalized not additional social work needs at this time. Patient to be closed to social work at this time.   Sheralyn Boatman [facility] Care Management [phone]

## 2017-10-11 ENCOUNTER — Other Ambulatory Visit: Payer: Self-pay | Admitting: *Deleted

## 2017-10-11 NOTE — Patient Outreach (Signed)
[name]) Care Management   10/11/2017  Cynthia Price 09-Dec-1945 [id number]  Cynthia Price is an 71 y.o. female  Subjective:  Pt reports been having muscle spasms all morning, daughter's future husband Was helping her off the toilet, hurt my left shoulder/did not know how to lift her up.  Pt reports Saw PCP yesterday, told her would not give her anything else for her muscle spasms,  Tylenol and Gabapentin not working.   Pt received a call during home visit from PCP office,  Was told muscle relaxer medication to be calling into her pharmacy.  Pt reports having  Diarrhea once today.  Pt reports no recent falls, son continues to assist/administer  Medications/Lovenox injections. Son reports pt to see Hematologist 11/28, questionable pt To go back on Xarelto.    Objective:  Vitals:   10/11/17 1540  BP: 104/60  Pulse: 82  Resp: 16  SpO2: 96%     ROS  Physical Exam  Constitutional: She is oriented to person, place, and time. She appears well-developed and well-nourished.  Cardiovascular: Normal rate, regular rhythm and normal heart sounds.  Respiratory: Effort normal and breath sounds normal.  GI: Soft. Bowel sounds are normal.  Musculoskeletal: She exhibits edema.  Neurological: She is alert and oriented to person, place, and time.  Skin: Skin is warm and dry.  Slight redness noted in left lower extremity/per pt been like that.   Psychiatric: She has a normal mood and affect. Her behavior is normal. Judgment and thought content normal.    Encounter Medications:   Outpatient Encounter Medications as of 10/11/2017  Medication Sig  . AMPYRA 10 MG TB12 Take 10 mg by mouth 2 (two) times daily.   . baclofen (LIORESAL) 10 MG tablet Take 10 mg by mouth 3 (three) times daily as needed.   . gabapentin (NEURONTIN) 800 MG tablet Take 800 mg by mouth 4 (four) times daily.   . hydroxyurea (HYDREA) 500 MG capsule Take one by mouth Monday, Tuesday, Wed, Thursday, Friday  .  oxyCODONE-acetaminophen (PERCOCET/ROXICET) 5-325 MG tablet Take 1 tablet by mouth every 6 (six) hours as needed for severe pain. (Patient not taking: Reported on 09/11/2017)  . rOPINIRole (REQUIP) 2 MG tablet Take 2 mg by mouth 4 (four) times daily.  . traMADol (ULTRAM) 50 MG tablet Take 1 tablet (50 mg total) by mouth every 6 (six) hours as needed.  Alveda Reasons 20 MG TABS tablet Take 20 mg by mouth daily with breakfast.    No facility-administered encounter medications on file as of 10/11/2017.     Functional Status:   In your present state of health, do you have any difficulty performing the following activities: 10/11/2017 08/19/2017  Hearing? N N  Vision? N Y  Difficulty concentrating or making decisions? N N  Walking or climbing stairs? Y Y  Dressing or bathing? N N  Doing errands, shopping? Cynthia Price [place]  Preparing Food and eating ? Y -  Using the Toilet? N -  In the past six months, have you accidently leaked urine? Y -  Do you have problems with loss of bowel control? N -  Managing your Medications? Y -  Managing your Finances? N -  Housekeeping or managing your Housekeeping? Y -  Some recent data might be hidden    Fall/Depression Screening:    Fall Risk  10/11/2017 09/11/2017 08/18/2017  Falls in the past year? Yes Yes Yes  Number falls in past yr: 2 or more 2 or more 1  Injury with Fall? Yes Yes Yes  Risk Factor Category  High Fall Risk High Fall Risk -  Risk for fall due to : Impaired balance/gait;Impaired mobility History of fall(s);Impaired mobility;Other (Comment) -  Risk for fall due to: Comment - diagnosis of MS -  Follow up Falls prevention discussed Falls prevention discussed Falls prevention discussed   PHQ 2/9 Scores 10/11/2017 08/18/2017  PHQ - 2 Score 4 2  PHQ- 9 Score 9 4    Assessment:  Pleasant 71 year old female, in bed upon arrival, having muscle spasms.   Son present  During home visit.   This RN CM following pt for transition of care/recent SNF discharge  09/08/17,  Admitted to rehab following September 22-25, 2018 hospitalization for intractable pain- result of  Fall.   Skin integrity: slight redness noted in Left lower extremity/per pt been there, MD aware.  Both legs  warm to touch.  Trace edema top of both feet.  Pain/muscle spasms:  Acute pain +5 today as reported by pt in right shoulder/pt took one Tramadol   During home visit.   Family to pick up new prescription of muscle relaxer at local pharmacy.     Plan:  As discussed with pt, plan to follow up again tomorrow telephonically, check on status, if        Picked up muscle relaxer medication.    [facility] CM Care Plan Problem One     Most Recent Value  Care Plan Problem One  Risk for hospitalization related to recent hospital visit September 22-25,2018 for fall and subsequent SNF visit   Role Documenting the Problem One  Care Management [place] for Problem One  Active  [facility] Long Term Goal   Pt will not experience re-admission over the next 31 days,as evidenced by pt reporting and review of EMR during RN CM outreach   [facility] Long Term Goal Start Date  09/11/17  Interventions for Problem One Long Term Goal  Home visit done- reviewed medications/ongoing adherence- son administers meds/Lovenox.    [facility] CM Short Term Goal #1   Pt will be free from falls within the next 7 days   THN CM Short Term Goal #1 Start Date  10/05/17  [facility] CM Short Term Goal #1 Met Date  10/12/17  THN CM Short Term Goal #2   Pt would schedule PCP folllow up appoiintment within the next 7 days   THN CM Short Term Goal #2 Start Date  10/05/17  [facility] CM Short Term Goal #2 Met Date  10/12/17     [name].   [place] Care Management  [phone]

## 2017-10-12 ENCOUNTER — Other Ambulatory Visit: Payer: Self-pay | Admitting: *Deleted

## 2017-10-12 NOTE — Patient Outreach (Signed)
Successful telephone encounter to Cynthia Price,71 year old female, follow up on yesterday's home visit- check on status of acute pain in  right shoulder, chronic muscle spasms.  Spoke with pt, reports right shoulder is better today, PCP's nurse called to check on her too, informed her MD would not be sending a prescription of a muscle relaxer,that right shoulder would have to heal/keep moving.   Pt provided HIPAA identifiers during call.   Pt also reports the muscle spasms are on and off, currently taking Baclofen and Gabapentin together which helps.   Pt reports PCP would send something for her diarrhea but she declined as does not have it today.   RN CM discussed with pt plan to complete transition of care program, follow up again next month telephonically-check on status to which pt agreed..    Plan:  As discussed with pt, plan to follow up again next month telephonically- check on clinical status.             Transition of care program completed.   [name].   [place] Care Management  [phone]

## 2017-10-24 DIAGNOSIS — N319 Neuromuscular dysfunction of bladder, unspecified: Secondary | ICD-10-CM | POA: Diagnosis not present

## 2017-10-24 DIAGNOSIS — D649 Anemia, unspecified: Secondary | ICD-10-CM | POA: Diagnosis not present

## 2017-10-24 DIAGNOSIS — R339 Retention of urine, unspecified: Secondary | ICD-10-CM | POA: Diagnosis not present

## 2017-10-24 DIAGNOSIS — G35 Multiple sclerosis: Secondary | ICD-10-CM | POA: Diagnosis not present

## 2017-10-24 DIAGNOSIS — I5032 Chronic diastolic (congestive) heart failure: Secondary | ICD-10-CM | POA: Diagnosis not present

## 2017-10-24 DIAGNOSIS — F329 Major depressive disorder, single episode, unspecified: Secondary | ICD-10-CM | POA: Diagnosis not present

## 2017-10-24 DIAGNOSIS — H3530 Unspecified macular degeneration: Secondary | ICD-10-CM | POA: Diagnosis not present

## 2017-10-24 DIAGNOSIS — G4733 Obstructive sleep apnea (adult) (pediatric): Secondary | ICD-10-CM | POA: Diagnosis not present

## 2017-10-24 DIAGNOSIS — I272 Pulmonary hypertension, unspecified: Secondary | ICD-10-CM | POA: Diagnosis not present

## 2017-10-24 DIAGNOSIS — G629 Polyneuropathy, unspecified: Secondary | ICD-10-CM | POA: Diagnosis not present

## 2017-10-24 DIAGNOSIS — I110 Hypertensive heart disease with heart failure: Secondary | ICD-10-CM | POA: Diagnosis not present

## 2017-10-24 DIAGNOSIS — I2510 Atherosclerotic heart disease of native coronary artery without angina pectoris: Secondary | ICD-10-CM | POA: Diagnosis not present

## 2017-10-24 DIAGNOSIS — I825Y2 Chronic embolism and thrombosis of unspecified deep veins of left proximal lower extremity: Secondary | ICD-10-CM | POA: Diagnosis not present

## 2017-10-24 DIAGNOSIS — M1712 Unilateral primary osteoarthritis, left knee: Secondary | ICD-10-CM | POA: Diagnosis not present

## 2017-10-25 DIAGNOSIS — D699 Hemorrhagic condition, unspecified: Secondary | ICD-10-CM | POA: Diagnosis not present

## 2017-11-03 ENCOUNTER — Emergency Department
Admission: EM | Admit: 2017-11-03 | Discharge: 2017-11-03 | Disposition: A | Discharge status: Home / Self Care | Payer: PPO | Attending: Emergency Medicine | Admitting: Emergency Medicine

## 2017-11-03 ENCOUNTER — Emergency Department: Payer: PPO

## 2017-11-03 ENCOUNTER — Other Ambulatory Visit: Payer: Self-pay

## 2017-11-03 DIAGNOSIS — Z7901 Long term (current) use of anticoagulants: Secondary | ICD-10-CM | POA: Insufficient documentation

## 2017-11-03 DIAGNOSIS — M179 Osteoarthritis of knee, unspecified: Secondary | ICD-10-CM | POA: Diagnosis not present

## 2017-11-03 DIAGNOSIS — G35 Multiple sclerosis: Secondary | ICD-10-CM | POA: Diagnosis not present

## 2017-11-03 DIAGNOSIS — I110 Hypertensive heart disease with heart failure: Secondary | ICD-10-CM | POA: Insufficient documentation

## 2017-11-03 DIAGNOSIS — M25551 Pain in right hip: Secondary | ICD-10-CM | POA: Diagnosis not present

## 2017-11-03 DIAGNOSIS — M25561 Pain in right knee: Secondary | ICD-10-CM | POA: Insufficient documentation

## 2017-11-03 DIAGNOSIS — I5032 Chronic diastolic (congestive) heart failure: Secondary | ICD-10-CM | POA: Insufficient documentation

## 2017-11-03 DIAGNOSIS — G8929 Other chronic pain: Secondary | ICD-10-CM | POA: Insufficient documentation

## 2017-11-03 DIAGNOSIS — I2510 Atherosclerotic heart disease of native coronary artery without angina pectoris: Secondary | ICD-10-CM | POA: Insufficient documentation

## 2017-11-03 DIAGNOSIS — M1611 Unilateral primary osteoarthritis, right hip: Secondary | ICD-10-CM | POA: Diagnosis not present

## 2017-11-03 LAB — COMPREHENSIVE METABOLIC PANEL
ALT: 15 U/L (ref 14–54)
ANION GAP: 11 (ref 5–15)
AST: 23 U/L (ref 15–41)
Albumin: 3.8 g/dL (ref 3.5–5.0)
Alkaline Phosphatase: 65 U/L (ref 38–126)
BUN: 22 mg/dL — ABNORMAL HIGH (ref 6–20)
CALCIUM: 9 mg/dL (ref 8.9–10.3)
CHLORIDE: 103 mmol/L (ref 101–111)
CO2: 28 mmol/L (ref 22–32)
Creatinine, Ser: 0.97 mg/dL (ref 0.44–1.00)
GFR calc non Af Amer: 57 mL/min — ABNORMAL LOW (ref >60)
Glucose, Bld: 102 mg/dL — ABNORMAL HIGH (ref 65–99)
Potassium: 4.2 mmol/L (ref 3.5–5.1)
SODIUM: 142 mmol/L (ref 135–145)
Total Bilirubin: 0.2 mg/dL — ABNORMAL LOW (ref 0.3–1.2)
Total Protein: 7.4 g/dL (ref 6.5–8.1)

## 2017-11-03 LAB — CBC WITH DIFFERENTIAL/PLATELET
Basophils Absolute: 0.1 10*3/uL (ref 0–0.1)
Basophils Relative: 1 %
EOS ABS: 0 10*3/uL (ref 0–0.7)
EOS PCT: 1 %
HCT: 36.7 % (ref 35.0–47.0)
Hemoglobin: 12.1 g/dL (ref 12.0–16.0)
Lymphocytes Relative: 17 %
Lymphs Abs: 0.9 10*3/uL — ABNORMAL LOW (ref 1.0–3.6)
MCH: 31.8 pg (ref 26.0–34.0)
MCHC: 33 g/dL (ref 32.0–36.0)
MCV: 96.1 fL (ref 80.0–100.0)
MONO ABS: 0.7 10*3/uL (ref 0.2–0.9)
MONOS PCT: 13 %
Neutro Abs: 3.3 10*3/uL (ref 1.4–6.5)
Neutrophils Relative %: 68 %
PLATELETS: 383 10*3/uL (ref 150–440)
RBC: 3.82 MIL/uL (ref 3.80–5.20)
RDW: 15.4 % — ABNORMAL HIGH (ref 11.5–14.5)
WBC: 5 10*3/uL (ref 3.6–11.0)

## 2017-11-03 MED ORDER — LIDOCAINE 5 % EX PTCH
1.0000 | MEDICATED_PATCH | CUTANEOUS | Status: DC
  Administered 2017-11-03: 1 via TRANSDERMAL
  Filled 2017-11-03: qty 1

## 2017-11-03 MED ORDER — FENTANYL CITRATE (PF) 100 MCG/2ML IJ SOLN
INTRAMUSCULAR | Status: AC
  Administered 2017-11-03: 50 ug via INTRAVENOUS
  Filled 2017-11-03: qty 2

## 2017-11-03 MED ORDER — FENTANYL CITRATE (PF) 100 MCG/2ML IJ SOLN
50.0000 ug | Freq: Once | INTRAMUSCULAR | Status: AC
  Administered 2017-11-03: 50 ug via INTRAVENOUS

## 2017-11-03 NOTE — ED Provider Notes (Signed)
[facility] Emergency Department Provider Note  ____________________________________________   I have reviewed the triage vital signs and the nursing notes.   HISTORY  Chief Complaint Spasms   History limited by: Not Limited   HPI [name] is a 71 y.o. female who presents to the emergency department today for x-rays secondary to right extremity pain.   LOCATION:right hip and right knee DURATION:at least 9 months (since march) TIMING: constant SEVERITY: severe QUALITY: spasms CONTEXT: patient states that she has known joint disease for a long time. Has seen orthopedic doctors who have recommended left knee replacement and have done joint injections in the past. Apparently spasms/pain was worse today. When PT evaluated her they recommended she come to the emergency department for x-rays. MODIFYING FACTORS: worse with movement ASSOCIATED SYMPTOMS: none  Per medical record review patient has a history of left knee pain with orthopedic evaluation.  Past Medical History:  Diagnosis Date  . Anemia 2012   from labs at [facility]  . Back pain    s/p hemilaminectomy with h/o herniated disc at L4L5  . Blood clot in vein    behind left knee  . BP (high blood pressure) 03/08/2014   Last Assessment & Plan:  Blood pressure has been controlled without significant dizzyness or associated fatigue. Taking antihypertensives as directed without difficulty.    Marland Kitchen CAD (coronary artery disease) 10/18/2011  . Chronic diastolic heart failure ([place]) 09/07/2014   Last Assessment & Plan:  Edema and sob seemingly baseline   . Collagen vascular disease ([place])   . Depression    after husband died  . DVT (deep venous thrombosis) ([place]) 05/14/2012  . Essential thrombocytosis ([place])   . Hyperlipidemia   . 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[place]) 03/05/2015  . Hypotension 03/26/2013  . IBS (irritable bowel syndrome)   . Insomnia   . Lupus anticoagulant positive   . Macular  degeneration   . MS (multiple sclerosis) ([place])   . Multiple sclerosis ([place]) 04/29/2008   Per neurology at [facility]    . NEUROPATHY 04/29/2008   Per [facility]rology at [facility]    . RLS (restless legs syndrome)   . Spinal stenosis    lumbar  . Urinary retention    with high post void residuals 2013, per [facility] uro    Patient Active Problem List   Diagnosis Date Noted  . Intractable pain 08/19/2017  . DS (disseminated sclerosis) ([place]) 09/15/2015  . Back sprain 06/05/2015  . Chest wall contusion 06/05/2015  . Effusion of knee 05/22/2015  . Arthritis of knee, degenerative 05/22/2015  . Contusion of shoulder 04/14/2015  . 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[place]) 03/05/2015  . Complete rotator cuff rupture of left shoulder 02/05/2015  . Infraspinatus tenosynovitis 02/05/2015  . Chronic diastolic heart failure ([place]) 09/07/2014  . Deep vein thrombosis ([place]) 07/10/2014  . HLD (hyperlipidemia) 03/08/2014  . BP (high blood pressure) 03/08/2014  . UTI (urinary tract infection) 02/05/2014  . Cellulitis, toe 08/18/2013  . Bradycardia 03/26/2013  . Hypotension 03/26/2013  . RLS (restless legs syndrome) 08/31/2012  . CN (constipation) 07/20/2012  . Prolapse of urethra 07/20/2012  . Frequent UTI 07/20/2012  . Neurogenic dysfunction of the urinary bladder 07/20/2012  . Atrophy of vagina 07/20/2012  . DVT (deep venous thrombosis) ([place]) 05/14/2012  . Risk for falls 03/16/2012  . Lumbar canal stenosis 02/16/2012  . Restless leg 01/20/2012  . Urinary retention 12/04/2011  . CAD (coronary artery disease) 10/18/2011  . Anemia 10/17/2011  . Diastolic dysfunction 33/29/5188  .  Tachycardia 10/01/2011  . Thrombocythemia, essential ([place]) 05/26/2011  . Edema 05/26/2011  . VITAMIN D DEFICIENCY 04/29/2010  . HYPERCHOLESTEROLEMIA 04/29/2010  . DEPRESSION, MILD 04/29/2010  . CYSTOCELE WITHOUT MENTION UTERINE PROLAPSE LAT 04/29/2010  . UNS ADVRS EFF OTH RX MEDICINAL&BIOLOGICAL SBSTNC 04/29/2010  . Multiple  sclerosis ([place]) 04/29/2008  . NEUROPATHY 04/29/2008  . ARTHRITIS 04/29/2008  . FATIGUE 04/29/2008    Past Surgical History:  Procedure Laterality Date  . APPENDECTOMY    . CHOLECYSTECTOMY    . CHOLECYSTECTOMY    . LUMBAR LAMINECTOMY      Prior to Admission medications   Medication Sig Start Date End Date Taking? Authorizing Provider  AMPYRA 10 MG TB12 Take 10 mg by mouth 2 (two) times daily.  09/11/12   [provider]  baclofen (LIORESAL) 10 MG tablet Take 10 mg by mouth 3 (three) times daily as needed.     [provider]  enoxaparin (LOVENOX) 40 MG/0.4ML injection Inject 40 mg daily into the skin. Pt takes twice a day    [provider]  gabapentin (NEURONTIN) 800 MG tablet Take 800 mg by mouth 4 (four) times daily.  05/10/13   Tonia Ghent, MD  hydroxyurea (HYDREA) 500 MG capsule Take one by mouth Monday, Tuesday, Wed, Thursday, Friday 05/10/13   Tonia Ghent, MD  oxyCODONE-acetaminophen (PERCOCET/ROXICET) 5-325 MG tablet Take 1 tablet by mouth every 6 (six) hours as needed for severe pain. Patient not taking: Reported on 09/11/2017 08/22/17   Vaughan Basta, MD  rOPINIRole (REQUIP) 2 MG tablet Take 2 mg by mouth 4 (four) times daily.    [provider]  traMADol (ULTRAM) 50 MG tablet Take 1 tablet (50 mg total) by mouth every 6 (six) hours as needed. 08/22/17   Vaughan Basta, MD  XARELTO 20 MG TABS tablet Take 20 mg by mouth daily with breakfast.  09/07/15   [provider]    Allergies Atorvastatin  Family History  Problem Relation Age of Onset  . Heart disease Mother   . Thyroid cancer Mother   . Ovarian cancer Mother   . Alcohol abuse Father   . Heart disease Father   . Diabetes Sister   . Diabetes Brother     Social History Social History   Tobacco Use  . Smoking status: Never Smoker  . Smokeless tobacco: Never Used  Substance Use Topics  . Alcohol use: No  . Drug use: No    Review of  Systems Constitutional: No fever/chills Eyes: No visual changes. ENT: No sore throat. Cardiovascular: Denies chest pain. Respiratory: Denies shortness of breath. Gastrointestinal: No abdominal pain.  No nausea, no vomiting.  No diarrhea.   Genitourinary: Negative for dysuria. Musculoskeletal: Positive for right hip and knee pain. Skin: Negative for rash. Neurological: Negative for headaches, focal weakness or numbness.  ____________________________________________   PHYSICAL EXAM:  VITAL SIGNS: ED Triage Vitals  Enc Vitals Group     BP 11/03/17 1428 113/63     Pulse Rate 11/03/17 1428 83     Resp 11/03/17 1428 20     Temp 11/03/17 1428 97.9 F (36.6 C)     Temp Source 11/03/17 1428 Oral     SpO2 --      Weight 11/03/17 1427 125 lb (56.7 kg)     Height 11/03/17 1427 5\' 4"  (1.626 m)    Constitutional: Alert and oriented. Well appearing and in no distress. Eyes: Conjunctivae are normal.  ENT   Head: Normocephalic and atraumatic.  Nose: No congestion/rhinnorhea.   Mouth/Throat: Mucous membranes are moist.   Neck: No stridor. Hematological/Lymphatic/Immunilogical: No cervical lymphadenopathy. Cardiovascular: Normal rate, regular rhythm.  No murmurs, rubs, or gallops.  Respiratory: Normal respiratory effort without tachypnea nor retractions. Breath sounds are clear and equal bilaterally. No wheezes/rales/rhonchi. Gastrointestinal: Soft and non tender. No rebound. No guarding.  Genitourinary: Deferred Musculoskeletal: Right hip with tenderness to manipulation. Right knee tender to palpation. Neurologic:  Normal speech and language. No gross focal neurologic deficits are appreciated.  Skin:  Skin is warm, dry and intact. No rash noted. Psychiatric: Mood and affect are normal. Speech and behavior are normal. Patient exhibits appropriate insight and judgment.  ____________________________________________    LABS (pertinent positives/negatives)  CBC wnl except  RDW 15.4, lymphocyte # CMP wnl except glu 102, bun 22, tot bili 0.2, gfr  ____________________________________________   EKG  None  ____________________________________________    RADIOLOGY  Right knee No fracture or dislocation. Chondrocalcinosis.  Right hip Moderate symmetric narrowing of hip joints. No fracture or dislocation.  ____________________________________________   PROCEDURES  Procedures  ____________________________________________   INITIAL IMPRESSION / ASSESSMENT AND PLAN / ED COURSE  Pertinent labs & imaging results that were available during my care of the patient were reviewed by me and considered in my medical decision making (see chart for details).  Patient presents to the emergency department today requesting x-rays of the right hip and knee secondary to chronic pain.  Patient states his pain has been going on for months.  On exam there is tenderness palpation of the knee and manipulation of the hip.  No overlying erythema or warmth to the knee.  X-rays were performed which did not show any acute process.  At this point I think arthritis likely.  Did discuss with patient that she would benefit from following up with her orthopedic doctors with whom she already has established care.  Stated they could potentially offer further injections.   ____________________________________________   FINAL CLINICAL IMPRESSION(S) / ED DIAGNOSES  Final diagnoses:  Right hip pain     Note: This dictation was prepared with Dragon dictation. Any transcriptional errors that result from this process are unintentional     Cynthia Pear, MD 11/03/17 1645

## 2017-11-03 NOTE — ED Triage Notes (Addendum)
Pt came to ED via EMS from home c/o muscle spasms, specifically in right hip and knee. Pt has history of MS. Reports here for pain control. Also reports resolving blood clot in left leg, does PT. Compliant with meds.

## 2017-11-03 NOTE — Discharge Instructions (Signed)
Please seek medical attention for any high fevers, chest pain, shortness of breath, change in behavior, persistent vomiting, bloody stool or any other new or concerning symptoms.  

## 2017-11-08 DIAGNOSIS — I2510 Atherosclerotic heart disease of native coronary artery without angina pectoris: Secondary | ICD-10-CM | POA: Diagnosis not present

## 2017-11-08 DIAGNOSIS — G35 Multiple sclerosis: Secondary | ICD-10-CM | POA: Diagnosis not present

## 2017-11-08 DIAGNOSIS — M1712 Unilateral primary osteoarthritis, left knee: Secondary | ICD-10-CM | POA: Diagnosis not present

## 2017-11-08 DIAGNOSIS — I825Y2 Chronic embolism and thrombosis of unspecified deep veins of left proximal lower extremity: Secondary | ICD-10-CM | POA: Diagnosis not present

## 2017-11-10 ENCOUNTER — Encounter: Payer: Self-pay | Admitting: *Deleted

## 2017-11-10 ENCOUNTER — Ambulatory Visit: Payer: Self-pay | Admitting: *Deleted

## 2017-11-10 ENCOUNTER — Other Ambulatory Visit: Payer: Self-pay | Admitting: *Deleted

## 2017-11-10 NOTE — Patient Outreach (Addendum)
[facility] [facility]) Care Management [place] Telephone Outreach  11/10/2017  Cynthia Price [birth year] [id number]  Successful telephone outreach to Cynthia Price, 71 y/o female referred to [place] for transition of care by [facility] post-acute facilitator RN CM, after recent hospitalization September 22-25, 2018 for intractable pain following fall in which patient incurred (L) hip and (L) rib cage contusions.  Patient was discharged to SNF at [facility] for rehabilitation post-hospital discharge and was subsequently discharged home from [facility] on September 08, 2017 to self-care with home health services for PT in place through [facility] home health agency.  Patient has history including, but not limited to, MS, chronic pain, previous DVT (on anticoagulant therapy), dCHF, and depression.  HIPAA/ identity verified with patient during phone call today.  Today, patient reports that she "is doing pretty good," and states that she continues having pain, which she states is "controlled good" with prescribed and OTC medications.  Reports chronic/ ongoing pain in her (L) knee, stating that when she is sitting her pain is "not bad," but when she goes to stand or ambulate, it "gets bad to about 8/10."  Reports "spasms" in her legs bilaterally with movement, stating that her "left knee is worse than the right."  Briefly discussed/ reviewed recent ED visit with patient; patient accurately stated that no changes to overall plan of care were made during ED visit on 11/03/17. Patient sounds to be in no apparent/ obvious distress throughout entirety of today's phone call.  I explained that I was contacting patient for primary [facility] RN CM, and she verbalizes understanding.   Patient further reports:  Medications: -- Has all medicationsand takes as prescribed;denies questions about current medications. -- verbalizes accurate report of use of prescribed medications for pain when compared to review  of EMR   Safety/ Mobility/ Falls: -- denies new/ recent falls post- SNF discharge -- assistive devices: cane, walker, wheelchair, states "mainly using wheelchair" since home from SNF -- general fall risks/ prevention education reiterated with patient today  Patient denies further issues, concerns, or problems today. I confirmed that patient has the main [facility] CM office phone number, and the [facility] CM 24-hour nurse advice phone number should issues arise prior to next [place] outreach by phone by primary [facility] RN CM.  Explained that primary [facility] RN CM would contact patient by phone and encouraged patient to contact main [facility] CM by using office phone number if needs arise prior to next [place] telephone outreach, and she verbalized understanding and agreement with this plan.  Plan:  Patient will continue using assistive devices for fall prevention as indicated  Patient will contact care providers for any needs, issues, or concerns that arise  I will update patient's primary [facility] RN CM of today's successful telephone outreach to patient   [facility] CM Care Plan Problem Two     Most Recent Value  Care Plan Problem Two  pain- acute right shoulder/chronic muscle spasms in legs   Role Documenting the Problem Two  Care Management [place] for Problem Two  Active  [facility]N CM Short Term Goal #1   Management of acute right shoulder pain/muscle spasms would continue to improve over the next 14 days  [Goal extended/ modified today ]  THN CM Short Term Goal #1 Start Date  11/10/17  Interventions for Short Term Goal #2   Discussed patient's current pain level with her, and her overall plan for pain management,  discussed recent ED  visit with patient,  encouraged patient to continue maintaining close contact with care providers in regards to chronic pain     Oneta Rack, RN, BSN, [place] Coordinator [facility] Care Management  [phone]

## 2017-11-21 ENCOUNTER — Emergency Department: Payer: PPO

## 2017-11-21 ENCOUNTER — Emergency Department
Admission: EM | Admit: 2017-11-21 | Discharge: 2017-11-21 | Disposition: A | Discharge status: Home / Self Care | Payer: PPO | Attending: Emergency Medicine | Admitting: Emergency Medicine

## 2017-11-21 DIAGNOSIS — I110 Hypertensive heart disease with heart failure: Secondary | ICD-10-CM | POA: Insufficient documentation

## 2017-11-21 DIAGNOSIS — G8929 Other chronic pain: Secondary | ICD-10-CM | POA: Insufficient documentation

## 2017-11-21 DIAGNOSIS — M79604 Pain in right leg: Secondary | ICD-10-CM | POA: Diagnosis not present

## 2017-11-21 DIAGNOSIS — Z79899 Other long term (current) drug therapy: Secondary | ICD-10-CM | POA: Insufficient documentation

## 2017-11-21 DIAGNOSIS — I5032 Chronic diastolic (congestive) heart failure: Secondary | ICD-10-CM | POA: Insufficient documentation

## 2017-11-21 DIAGNOSIS — Z7901 Long term (current) use of anticoagulants: Secondary | ICD-10-CM | POA: Insufficient documentation

## 2017-11-21 DIAGNOSIS — M79661 Pain in right lower leg: Secondary | ICD-10-CM | POA: Diagnosis not present

## 2017-11-21 DIAGNOSIS — I2510 Atherosclerotic heart disease of native coronary artery without angina pectoris: Secondary | ICD-10-CM | POA: Insufficient documentation

## 2017-11-21 MED ORDER — OXYCODONE HCL 5 MG PO TABS
10.0000 mg | ORAL_TABLET | Freq: Once | ORAL | Status: AC
  Administered 2017-11-21: 10 mg via ORAL
  Filled 2017-11-21: qty 2

## 2017-11-21 NOTE — ED Notes (Signed)
X ray in room.

## 2017-11-21 NOTE — ED Notes (Signed)
ED Provider at bedside. 

## 2017-11-21 NOTE — ED Provider Notes (Signed)
[facility] Emergency Department Provider Note  ____________________________________________   First MD Initiated Contact with Patient 11/21/17 1020     (approximate)  I have reviewed the triage vital signs and the nursing notes.   HISTORY  Chief Complaint Leg Pain   HPI Cynthia Price is a 71 y.o. female with history of multiple sclerosis as well as DVT on Lovenox and hospice care who is presenting to the emergency department right lower extremity pain.  She is not reporting any injury but says that the pain is sharp intermittent worse with movement.  She says that she received pain medication prior to coming to the hospital as her pain is improved but she reports that still is an 8 out of 10 despite not wanting any pain medication in addition at this time.  Not reporting any fever.  Patient has an indwelling Foley and recently started Keflex for antibiotic treatment for UTI.  Not reporting any swelling or redness to the lower extremity.  Patient reports the pain is more anterior but does say that it is diffuse to the right lower extremity to the leg.  Past Medical History:  Diagnosis Date  . Anemia 2012   from labs at [facility]  . Back pain    s/p hemilaminectomy with h/o herniated disc at L4L5  . Blood clot in vein    behind left knee  . BP (high blood pressure) 03/08/2014   Last Assessment & Plan:  Blood pressure has been controlled without significant dizzyness or associated fatigue. Taking antihypertensives as directed without difficulty.    Marland Kitchen CAD (coronary artery disease) 10/18/2011  . Chronic diastolic heart failure ([place]) 09/07/2014   Last Assessment & Plan:  Edema and sob seemingly baseline   . Collagen vascular disease ([place])   . Depression    after husband died  . DVT (deep venous thrombosis) ([place]) 05/14/2012  . Essential thrombocytosis ([place])   . Hyperlipidemia   . 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[place]) 03/05/2015  . Hypotension 03/26/2013  . IBS  (irritable bowel syndrome)   . Insomnia   . Lupus anticoagulant positive   . Macular degeneration   . MS (multiple sclerosis) ([place])   . Multiple sclerosis ([place]) 04/29/2008   Per neurology at [facility]    . NEUROPATHY 04/29/2008   Per Neurology at [facility]    . RLS (restless legs syndrome)   . Spinal stenosis    lumbar  . Urinary retention    with high post void residuals 2013, per [facility] u[facility]    Patient Active Problem List   Diagnosis Date Noted  . Intractable pain 08/19/2017  . DS (disseminated sclerosis) ([place]) 09/15/2015  . Back sprain 06/05/2015  . Chest wall contusion 06/05/2015  . Effusion of knee 05/22/2015  . Arthritis of knee, degenerative 05/22/2015  . Contusion of shoulder 04/14/2015  . 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[place]) 03/05/2015  . Complete rotator cuff rupture of left shoulder 02/05/2015  . Infraspinatus tenosynovitis 02/05/2015  . Chronic diastolic heart failure ([place]) 09/07/2014  . Deep vein thrombosis ([place]) 07/10/2014  . HLD (hyperlipidemia) 03/08/2014  . BP (high blood pressure) 03/08/2014  . UTI (urinary tract infection) 02/05/2014  . Cellulitis, toe 08/18/2013  . Bradycardia 03/26/2013  . Hypotension 03/26/2013  . RLS (restless legs syndrome) 08/31/2012  . CN (constipation) 07/20/2012  . Prolapse of urethra 07/20/2012  . Frequent UTI 07/20/2012  . Neurogenic dysfunction of the urinary bladder 07/20/2012  . Atrophy of vagina 07/20/2012  . DVT (deep venous thrombosis) (  [place]) 05/14/2012  . Risk for falls 03/16/2012  . Lumbar canal stenosis 02/16/2012  . Restless leg 01/20/2012  . Urinary retention 12/04/2011  . CAD (coronary artery disease) 10/18/2011  . Anemia 10/17/2011  . Diastolic dysfunction 60/63/0160  . Tachycardia 10/01/2011  . Thrombocythemia, essential ([place]) 05/26/2011  . Edema 05/26/2011  . VITAMIN D DEFICIENCY 04/29/2010  . HYPERCHOLESTEROLEMIA 04/29/2010  . DEPRESSION, MILD 04/29/2010  . CYSTOCELE WITHOUT MENTION UTERINE PROLAPSE LAT  04/29/2010  . UNS ADVRS EFF OTH RX MEDICINAL&BIOLOGICAL SBSTNC 04/29/2010  . Multiple sclerosis ([place]) 04/29/2008  . NEUROPATHY 04/29/2008  . ARTHRITIS 04/29/2008  . FATIGUE 04/29/2008    Past Surgical History:  Procedure Laterality Date  . APPENDECTOMY    . CHOLECYSTECTOMY    . CHOLECYSTECTOMY    . LUMBAR LAMINECTOMY      Prior to Admission medications   Medication Sig Start Date End Date Taking? Authorizing Provider  AMPYRA 10 MG TB12 Take 10 mg by mouth 2 (two) times daily.  09/11/12   [provider]  baclofen (LIORESAL) 10 MG tablet Take 10 mg by mouth 3 (three) times daily as needed.     [provider]  enoxaparin (LOVENOX) 40 MG/0.4ML injection Inject 40 mg daily into the skin. Pt takes twice a day    [provider]  gabapentin (NEURONTIN) 800 MG tablet Take 800 mg by mouth 4 (four) times daily.  05/10/13   Tonia Ghent, MD  hydroxyurea (HYDREA) 500 MG capsule Take one by mouth Monday, Tuesday, Wed, Thursday, Friday 05/10/13   Tonia Ghent, MD  oxyCODONE-acetaminophen (PERCOCET/ROXICET) 5-325 MG tablet Take 1 tablet by mouth every 6 (six) hours as needed for severe pain. Patient not taking: Reported on 09/11/2017 08/22/17   Vaughan Basta, MD  rOPINIRole (REQUIP) 2 MG tablet Take 2 mg by mouth 4 (four) times daily.    [provider]  sodium chloride (OCEAN) 0.65 % nasal spray Place 1 spray into the nose every 4 (four) hours as needed. 07/21/17   [provider]  topiramate (TOPAMAX) 50 MG tablet Take 1 tablet by mouth 2 (two) times daily. 10/11/17   [provider]  traMADol (ULTRAM) 50 MG tablet Take 1 tablet (50 mg total) by mouth every 6 (six) hours as needed. 08/22/17   Vaughan Basta, MD  XARELTO 20 MG TABS tablet Take 20 mg by mouth daily with breakfast.  09/07/15   [provider]    Allergies Atorvastatin  Family History  Problem Relation Age of Onset  . Heart disease Mother   .  Thyroid cancer Mother   . Ovarian cancer Mother   . Alcohol abuse Father   . Heart disease Father   . Diabetes Sister   . Diabetes Brother     Social History Social History   Tobacco Use  . Smoking status: Never Smoker  . Smokeless tobacco: Never Used  Substance Use Topics  . Alcohol use: No  . Drug use: No    Review of Systems  Constitutional: No fever/chills Eyes: No visual changes. ENT: No sore throat. Cardiovascular: Denies chest pain. Respiratory: Denies shortness of breath. Gastrointestinal: No abdominal pain.  No nausea, no vomiting.  No diarrhea.  No constipation. Genitourinary: Negative for dysuria. Musculoskeletal: Negative for back pain. Skin: Negative for rash. Neurological: Negative for headaches, focal weakness or numbness.   ____________________________________________   PHYSICAL EXAM:  VITAL SIGNS: ED Triage Vitals  Enc Vitals Group     BP 11/21/17 1027 (!) 93/41  Pulse Rate 11/21/17 1027 71     Resp 11/21/17 1027 16     Temp 11/21/17 1027 97.9 F (36.6 C)     Temp Source 11/21/17 1027 Oral     SpO2 11/21/17 1027 99 %     Weight 11/21/17 1028 125 lb (56.7 kg)     Height 11/21/17 1028 5\' 4"  (1.626 m)     Head Circumference --      Peak Flow --      Pain Score 11/21/17 1026 8     Pain Loc --      Pain Edu? --      Excl. in [place]? --     Constitutional: Alert and oriented. Well appearing and in no acute distress. Eyes: Conjunctivae are normal.  Head: Atraumatic. Nose: No congestion/rhinnorhea. Mouth/Throat: Mucous membranes are moist.  Neck: No stridor.   Cardiovascular: Normal rate, regular rhythm. Grossly normal heart sounds.  Good peripheral circulation. Respiratory: Normal respiratory effort.  No retractions. Lungs CTAB. Gastrointestinal: Soft and nontender. No distention. No CVA tenderness. Musculoskeletal: Very mild and diffuse tenderness to the right calf as well as anterior tibia.  There is no redness, no swelling, no edema.   Distally the patient is neurovascularly intact with sensation to light touch to the bilateral lower 70s as well as dorsalis pedis pulses that are present and equal.  Patient also able to range her toes.  I do not see any bony deformity or effusion. Neurologic:  Normal speech and language. No gross focal neurologic deficits are appreciated. Skin:  Skin is warm, dry and intact. No rash noted. Psychiatric: Mood and affect are normal. Speech and behavior are normal.  ____________________________________________   LABS (all labs ordered are listed, but only abnormal results are displayed)  Labs Reviewed - No data to display ____________________________________________  EKG   ____________________________________________  RADIOLOGY  No acute finding on the x-ray of the tibia and fibula to the right as well as a negative ultrasound study ____________________________________________   PROCEDURES  Procedure(s) performed:   Procedures  Critical Care performed:   ____________________________________________   INITIAL IMPRESSION / ASSESSMENT AND PLAN / ED COURSE  Pertinent labs & imaging results that were available during my care of the patient were reviewed by me and considered in my medical decision making (see chart for details).  DDX: DVT, cellulitis, muscle spasm, fracture, knee effusion, ankle effusion, As part of my medical decision making, I reviewed the following data within the [facility] Notes from prior ED visits     ----------------------------------------- 12:10 PM on 11/21/2017 -----------------------------------------  Patient without any distress at this time but says that the pain has come back.  We will give her another 10 mg of oxycodone as she has received at home.  Patient's son appears frustrated and is saying that he is overwhelmed at home taking care of his mother.  He says that these issues have been chronic with the patient having pain  in both lower extremities.  However, the patient wishes to go home and says there are multiple relatives who are able to assist.  Patient will be discharged at this time.  She will follow-up with orthopedic doctor at [facility] as well as hospice care.  ____________________________________________   FINAL CLINICAL IMPRESSION(S) / ED DIAGNOSES  Chronic pain.  lower extremity pain.    NEW MEDICATIONS STARTED DURING THIS VISIT:  This SmartLink is deprecated. Use AVSMEDLIST instead to display the medication list for a patient.   Note:  This  document was prepared using Systems analyst and may include unintentional dictation errors.     Orbie Pyo, MD 11/21/17 [phone]

## 2017-11-21 NOTE — ED Triage Notes (Signed)
Pt presents via EMS c/o right leg pain starting this am. Report hx DVT. Pt has hx MS and is on Hospice care per EMS. Lives at home with son. Pt A&Ox4.

## 2017-11-21 NOTE — ED Notes (Signed)
Pending EMS transport. Son at bedside.

## 2017-11-21 NOTE — ED Notes (Signed)
Patient transported to [facility] 

## 2017-11-22 ENCOUNTER — Other Ambulatory Visit: Payer: Self-pay | Admitting: *Deleted

## 2017-11-22 DIAGNOSIS — Z9181 History of falling: Secondary | ICD-10-CM | POA: Diagnosis not present

## 2017-11-22 DIAGNOSIS — R252 Cramp and spasm: Secondary | ICD-10-CM | POA: Diagnosis not present

## 2017-11-22 DIAGNOSIS — I5032 Chronic diastolic (congestive) heart failure: Secondary | ICD-10-CM | POA: Diagnosis not present

## 2017-11-22 DIAGNOSIS — W19XXXA Unspecified fall, initial encounter: Secondary | ICD-10-CM | POA: Diagnosis not present

## 2017-11-22 DIAGNOSIS — M5031 Other cervical disc degeneration,  high cervical region: Secondary | ICD-10-CM | POA: Diagnosis not present

## 2017-11-22 DIAGNOSIS — M79604 Pain in right leg: Secondary | ICD-10-CM | POA: Diagnosis not present

## 2017-11-22 DIAGNOSIS — G8929 Other chronic pain: Secondary | ICD-10-CM | POA: Diagnosis not present

## 2017-11-22 DIAGNOSIS — I110 Hypertensive heart disease with heart failure: Secondary | ICD-10-CM | POA: Diagnosis not present

## 2017-11-22 DIAGNOSIS — M542 Cervicalgia: Secondary | ICD-10-CM | POA: Diagnosis not present

## 2017-11-22 DIAGNOSIS — Z86718 Personal history of other venous thrombosis and embolism: Secondary | ICD-10-CM | POA: Diagnosis not present

## 2017-11-22 DIAGNOSIS — Z7901 Long term (current) use of anticoagulants: Secondary | ICD-10-CM | POA: Diagnosis not present

## 2017-11-22 DIAGNOSIS — R531 Weakness: Secondary | ICD-10-CM | POA: Diagnosis not present

## 2017-11-22 DIAGNOSIS — R296 Repeated falls: Secondary | ICD-10-CM | POA: Diagnosis not present

## 2017-11-22 DIAGNOSIS — I10 Essential (primary) hypertension: Secondary | ICD-10-CM | POA: Diagnosis not present

## 2017-11-22 DIAGNOSIS — G35 Multiple sclerosis: Secondary | ICD-10-CM | POA: Diagnosis not present

## 2017-11-22 DIAGNOSIS — Z741 Need for assistance with personal care: Secondary | ICD-10-CM | POA: Diagnosis not present

## 2017-11-22 DIAGNOSIS — R937 Abnormal findings on diagnostic imaging of other parts of musculoskeletal system: Secondary | ICD-10-CM | POA: Diagnosis not present

## 2017-11-22 NOTE — Patient Outreach (Signed)
Unsuccessful telephone encounter to Cynthia Price,71 year old female, follow up on earlier call with son [name] today  who reported pt was at [facility] ED for dehydration, to be discharged home today.    As discussed with son, RN CM to follow up with pt to inform of discharge from [place] services due to receiving Hospice services.   HIPAA compliant voice message left with RN CM's contact name and number.    Plan:  If no response to voice message left, plan to follow up within next 2 days.    [name].   [place] [facility]  [phone]

## 2017-11-22 NOTE — Patient Outreach (Signed)
Successful telephone encounter for Cynthia Price, 71 year old female, spoke with son [name] (on consent form) who reports pt is currently in ER at [facility] - dehydrated, HIPAA identifiers on pt provided by son.   RN CM discussed with son today's call was to  follow up on 12/24 ED visit to which son reports yesterday's ED visit no IV fluids were given to pt  but visit to [facility] ED today agreed pt was dehydrated, IV fluids given. Son reports ED MD is also going to give pt something else for pain, cannot take  Morphine and Oxycodone -drowsy.    RN CM discussed with son  about pt receiving Hospice (view in EMR  12/24 ED provider note- Pt on hospice care) to which son confirmed  pt is receiving Hospice care, not too happy with them as told Hospice nurse pt not drinking, put a call into them today  but no call back,reason for ED visit.    RN CM informed son with pt receiving Hospice, [facility] to discharge from [place] services.  RN CM discussed with son to inform Hospice of  Today's ED visit (dehydration) to  which son agreed to do.    Plan:  As discussed with son, plan to follow up with pt later today telephonically - inform of discharge from [place] services due to receiving Hospice services.    [name].   [place] [facility]  [phone]

## 2017-11-23 ENCOUNTER — Other Ambulatory Visit: Payer: Self-pay | Admitting: *Deleted

## 2017-11-23 ENCOUNTER — Encounter: Payer: Self-pay | Admitting: *Deleted

## 2017-11-23 DIAGNOSIS — R296 Repeated falls: Secondary | ICD-10-CM | POA: Diagnosis not present

## 2017-11-23 DIAGNOSIS — I10 Essential (primary) hypertension: Secondary | ICD-10-CM | POA: Diagnosis not present

## 2017-11-23 DIAGNOSIS — Z741 Need for assistance with personal care: Secondary | ICD-10-CM | POA: Diagnosis not present

## 2017-11-23 DIAGNOSIS — G35 Multiple sclerosis: Secondary | ICD-10-CM | POA: Diagnosis not present

## 2017-11-23 NOTE — Patient Outreach (Addendum)
Successful telephone encounter to [name], 71 year old female follow up on yesterday's ED visit, inform pt of case closure due to receiving Hospice services.   Pt's history includes but not limited to MS, CAD, DVT, Chronic diastolic HF, Restless leg syndrome, depression.  Spoke with pt, HIPAA identifiers verified.  Pt reports on yesterday's ED visit (at [facility]), was given fluids (dehydration), took x rays, no changes- degenerative in right hip/knee. Pt reports they also gave her something new for pain, pain was so terrible the other night, does not know the name of new medication.   Discussed with pt conversation with son yesterday, informed RN CM she is  receiving Hospice services to which pt confirmed- reports Hospice gave me Oxycodone,had a reaction (hallucination),cannot take.  RN CM discussed with pt letting Hospice know about yesterday's ED visit, new pain medication given to which pt reports son takes care of that.    RN CM also discussed with pt with her receiving Hospice services, plan to discharge from [place] services/notify PCP.      Plan:  As discussed with pt, plan to discharge from [place] services with her receiving Hospice.           Plan to inform PCP of discharge, send case closure letter.          Plan to inform [facility] CMA to close case.   Addendum:  Visit information added.   [name].   [place] [facility]  [phone]

## 2017-12-19 DIAGNOSIS — L98491 Non-pressure chronic ulcer of skin of other sites limited to breakdown of skin: Secondary | ICD-10-CM | POA: Insufficient documentation

## 2018-01-03 DIAGNOSIS — E441 Mild protein-calorie malnutrition: Secondary | ICD-10-CM | POA: Insufficient documentation

## 2018-05-17 ENCOUNTER — Ambulatory Visit (INDEPENDENT_AMBULATORY_CARE_PROVIDER_SITE_OTHER): Payer: Medicare HMO | Admitting: Neurology

## 2018-05-17 ENCOUNTER — Encounter: Payer: Self-pay | Admitting: Neurology

## 2018-05-17 ENCOUNTER — Other Ambulatory Visit: Payer: Self-pay

## 2018-05-17 VITALS — BP 99/62 | HR 97 | Ht 64.0 in | Wt 106.0 lb

## 2018-05-17 DIAGNOSIS — G2581 Restless legs syndrome: Secondary | ICD-10-CM

## 2018-05-17 DIAGNOSIS — N390 Urinary tract infection, site not specified: Secondary | ICD-10-CM

## 2018-05-17 DIAGNOSIS — M62838 Other muscle spasm: Secondary | ICD-10-CM | POA: Diagnosis not present

## 2018-05-17 DIAGNOSIS — R262 Difficulty in walking, not elsewhere classified: Secondary | ICD-10-CM | POA: Diagnosis not present

## 2018-05-17 DIAGNOSIS — R339 Retention of urine, unspecified: Secondary | ICD-10-CM

## 2018-05-17 DIAGNOSIS — F32A Depression, unspecified: Secondary | ICD-10-CM

## 2018-05-17 DIAGNOSIS — M48061 Spinal stenosis, lumbar region without neurogenic claudication: Secondary | ICD-10-CM | POA: Diagnosis not present

## 2018-05-17 DIAGNOSIS — F329 Major depressive disorder, single episode, unspecified: Secondary | ICD-10-CM

## 2018-05-17 DIAGNOSIS — G35 Multiple sclerosis: Secondary | ICD-10-CM

## 2018-05-17 MED ORDER — BACLOFEN 10 MG PO TABS
10.0000 mg | ORAL_TABLET | Freq: Three times a day (TID) | ORAL | 11 refills | Status: DC
Start: 2018-05-17 — End: 2018-12-17

## 2018-05-17 NOTE — Progress Notes (Signed)
[facility]  PATIENT: Cynthia Price DOB: 1945/12/30  REFERRING DOCTOR OR PCP:  Frazier Richards SOURCE: patient, notes from [facility] (Moshe Cipro, [place] and [place], Imaging and lab reports, MRI images on CD PACS.     _________________________________   HISTORICAL  CHIEF COMPLAINT:  Chief Complaint  Patient presents with  . Gait Disturbance    [name] is here with her son [name] and friend Arbie Cookey, to transfer care of MS to Dr. Felecia Shelling.  Most recently followed by [facility]. Lives at [facility] [place], [place] [phone].  Sts. she was dx. at age 74, and has never been on any MS DMT.  Sts. she never had any problem until approx. 2010 when she fell while trying to change a lightbulb in a ceiling light fixture.  Sts. she hurt her back and required surgery (Dr. Sharol Harness at [facility]). She had trouble walking from that point on,   . Spasticity    and around 2012, she began haivng more spasticity in her left leg. Has been in a w/c since Dec. 2018.    HISTORY OF PRESENT ILLNESS:  I had the pleasure seeing patient, Cynthia Price, and [facility] for neurologic consultation regarding her worsening spasticity and progressive symptoms of MS.  She is a 72 year old woman who was diagnose with MS about 35 years ago.   She initially had tingling in her left leg.   As she also had a hematologic issue, she was not sent to a neurologist initially.   Then one day, she had weakness in her left arm for one day that was better the next day.   She also had intermittent diplopia.     She did well for many years and 15 years ago had no limp and was able to climb a ladder.   Over the next 5-10 years, she developed a mild limp but could still climb stairs.   She had further progressive gait issues mostly due to left leg weakness and spasticity over the next few years.    In 2012, she was able to walk a block without a cane.   he has had multiple operations  in her lower back and has PLIF at [facility].    After her last lumbar operation in 2013, she had a lot more difficulty with her gait.   She had fallen before the surgery. After her surgery, gait was worse and she needed to use a walker.     She went from the walker to a chair about a year ago due to worsening left > right leg weakness and spasticity.     She has left arm weakness on her left starting around 2013.      She also has a left rotator cuff tear.   She can transfer by herself to an even level but not up from the bed or to the bathroom (lift is needed).      In the past, she was on baclofen but it was stopped due to theoretical concern about sedation as also on opiate.    She is on tizanidine only at bedtime (4 mg).    Her legs draw up more at night from spasticity, left > right.     She also reports needing a catheter due to urinary incontinence (lives in SNF and they recommended the catheter).    She has reduce division out of her left eye and colors are desaturated out of her left eye.  She has trouble with sleep maintenance insomnia (occasioanlly sleep onset).   She wakes up after a couple hours and then has trouble falling back asleep.    She spends most of the time in bed aso is no longer fatigued.  PT was working with her to try to get back to a walker but now is only trying to help with transfers.     She feels more depressed over the past couple years.    She feels her cognition is doing well.   She is on Xarelto due to a DVT after a sternal fracture about 6 months ago.   Reportedly, her repeat U/S was normal.        The following MRI's were personally reviewed: MRI of the lumbar spine 01/19/2017 shows L3-L4 fusion, mild spinal stenosis at L2-L3, left hemilaminectomy at L4-L5 with mild spinal stenosis and bilateral foraminal and lateral recess stenosis, moderately severe left foraminal narrowing at L5-S1.  MRI of the brain 05/26/2016 personally compared to the MRI of the brain 06/19/2007.   There are multiple periventricular, juxtacortical and deep white matter foci in the brain, there are also a focus in the left thalamus and in the left middle cerebellar peduncle.  There has been mild to moderate progression between 2008 and 2017.  MRI of the cervical spine 03/10/2011.  There is a large focus adjacent to C4 within the spinal cord and a smaller focus adjacent to C7-T1.  Mild degenerative changes are noted.  She has kyphosis.  Multiple records from [facility] neurology were reviewed and part of the summary above.  REVIEW OF SYSTEMS: Constitutional: No fevers, chills, sweats, or change in appetite.   She has insomnia Eyes: No visual changes, double vision, eye pain Ear, nose and throat: No hearing loss, ear pain, nasal congestion, sore throat Cardiovascular: No chest pain, palpitations Respiratory: No shortness of breath at rest or with exertion.   No wheezes GastrointestinaI: No nausea, vomiting, diarrhea, abdominal pain, fecal incontinence Genitourinary:Has indwelling catheter. Musculoskeletal: No neck pain, back pain Integumentary: No rash, pruritus, skin lesions Neurological: as above Psychiatric: Notes depression and mild anxiety Endocrine: No palpitations, diaphoresis, change in appetite, change in weigh or increased thirst Hematologic/Lymphatic: No anemia, purpura, petechiae. Allergic/Immunologic: No itchy/runny eyes, nasal congestion, recent allergic reactions, rashes  ALLERGIES: Allergies  Allergen Reactions  . Atorvastatin     Muscle aches at >40mg      HOME MEDICATIONS:  Current Outpatient Medications:  .  AMPYRA 10 MG TB12, Take 10 mg by mouth 2 (two) times daily. , Disp: , Rfl:  .  diclofenac sodium (VOLTAREN) 1 % GEL, Apply topically 4 (four) times daily., Disp: , Rfl:  .  escitalopram (LEXAPRO) 5 MG tablet, , Disp: , Rfl:  .  gabapentin (NEURONTIN) 800 MG tablet, Take 800 mg by mouth 4 (four) times daily. , Disp: , Rfl:  .  guaifenesin (ROBITUSSIN) 100  MG/5ML syrup, Take 200 mg by mouth 3 (three) times daily as needed for cough., Disp: , Rfl:  .  hydroxyurea (HYDREA) 500 MG capsule, Take one by mouth Monday, Tuesday, Wed, Thursday, Friday, Disp: 90 capsule, Rfl: 3 .  metoCLOPramide (REGLAN) 5 MG tablet, Take by mouth., Disp: , Rfl:  .  Multiple Vitamin (MULTIVITAMIN) tablet, Take 1 tablet by mouth daily., Disp: , Rfl:  .  oxyCODONE (OXY IR/ROXICODONE) 5 MG immediate release tablet, , Disp: , Rfl:  .  oxyCODONE-acetaminophen (PERCOCET/ROXICET) 5-325 MG tablet, Take 1 tablet by mouth every 6 (six) hours as needed for  severe pain., Disp: 15 tablet, Rfl: 0 .  rOPINIRole (REQUIP) 2 MG tablet, Take 2 mg by mouth 4 (four) times daily., Disp: , Rfl:  .  tiZANidine (ZANAFLEX) 4 MG tablet, , Disp: , Rfl:  .  XARELTO 20 MG TABS tablet, Take 20 mg by mouth daily with breakfast. , Disp: , Rfl:  .  baclofen (LIORESAL) 10 MG tablet, Take 1 tablet (10 mg total) by mouth 3 (three) times daily., Disp: 90 each, Rfl: 11  PAST MEDICAL HISTORY: Past Medical History:  Diagnosis Date  . Anemia 2012   from labs at [facility]  . Back pain    s/p hemilaminectomy with h/o herniated disc at L4L5  . Blood clot in vein    behind left knee  . BP (high blood pressure) 03/08/2014   Last Assessment & Plan:  Blood pressure has been controlled without significant dizzyness or associated fatigue. Taking antihypertensives as directed without difficulty.    Marland Kitchen CAD (coronary artery disease) 10/18/2011  . Chronic diastolic heart failure ([place]) 09/07/2014   Last Assessment & Plan:  Edema and sob seemingly baseline   . Collagen vascular disease ([place])   . Depression    after husband died  . DVT (deep venous thrombosis) ([place]) 05/14/2012  . Essential thrombocytosis ([place])   . Hyperlipidemia   . 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[place]) 03/05/2015  . Hypotension 03/26/2013  . IBS (irritable bowel syndrome)   . Insomnia   . Lupus anticoagulant positive   . Macular degeneration   . MS  (multiple sclerosis) ([place])   . Multiple sclerosis ([place]) 04/29/2008   [facility]    . NEUROPATHY 04/29/2008   Per Neurology at [facility]    . RLS (restless legs syndrome)   . Spinal stenosis    lumbar  . Urinary retention    with high post void residuals 2013, per [facility] uro    PAST SURGICAL HISTORY: Past Surgical History:  Procedure Laterality Date  . APPENDECTOMY    . CHOLECYSTECTOMY    . CHOLECYSTECTOMY    . LUMBAR LAMINECTOMY      FAMILY HISTORY: Family History  Problem Relation Age of Onset  . Heart disease Mother   . Thyroid cancer Mother   . Ovarian cancer Mother   . Alcohol abuse Father   . Heart disease Father   . Diabetes Sister   . Diabetes Brother   . Multiple sclerosis Paternal Aunt   . Multiple sclerosis Cousin     SOCIAL HISTORY:  Social History   Socioeconomic History  . Marital status: Widowed    Spouse name: Not on file  . Number of children: 2  . Years of education: Not on file  . Highest education level: Not on file  Occupational History    Employer: Retired  Scientific laboratory technician  . Financial resource strain: Not on file  . Food insecurity:    Worry: Not on file    Inability: Not on file  . Transportation needs:    Medical: Not on file    Non-medical: Not on file  Tobacco Use  . Smoking status: Never Smoker  . Smokeless tobacco: Never Used  Substance and Sexual Activity  . Alcohol use: No  . Drug use: No  . Sexual activity: Not on file  Lifestyle  . Physical activity:    Days per week: Not on file    Minutes per session: Not on file  . Stress: Not on file  Relationships  . Social connections:  Talks on phone: Not on file    Gets together: Not on file    Attends religious service: Not on file    Active member of club or organization: Not on file    Attends meetings of clubs or organizations: Not on file    Relationship status: Not on file  . Intimate partner violence:    Fear of current or ex partner: Not on file    Emotionally  abused: Not on file    Physically abused: Not on file    Forced sexual activity: Not on file  Other Topics Concern  . Not on file  Social History Narrative  . Not on file     PHYSICAL EXAM  Vitals:   05/17/18 1321  BP: 99/62  Pulse: 97  Weight: 106 lb (48.1 kg)  Height: 5\' 4"  (1.626 m)    Body mass index is 18.19 kg/m.   General: The patient is well-developed and well-nourished woman in a wheelchair and in no acute distress  Eyes:  Funduscopic exam shows normal optic discs and retinal vessels.  Neck: The neck is supple, no carotid bruits are noted.  The neck is nontender.  Cardiovascular: The heart has a regular rate and rhythm with a normal S1 and S2. There were no murmurs, gallops or rubs. Lungs are clear to auscultation.  Skin: Extremities are without significant edema.  Musculoskeletal:  Back is nontender  Neurologic Exam  Mental status: The patient is alert and oriented x 3 at the time of the examination. The patient has apparent normal recent and remote memory, with an apparently normal attention span and concentration ability.   Speech is normal.  Cranial nerves: Extraocular movements are full. Pupils are equal, round, and reactive to light and accomodation.  Visual fields are full.  Facial symmetry is present. There is good facial sensation to soft touch bilaterally.Facial strength is normal.  Trapezius and sternocleidomastoid strength is normal. No dysarthria is noted.  The tongue is midline, and the patient has symmetric elevation of the soft palate. No obvious hearing deficits are noted.  Motor: Muscle bulk was normal.  She had normal muscle tone in the right arm, mildly increased muscle tone in the left arm, moderately increased muscle tone in the right leg and severely increased muscle tone with contractures of the hamstring in the left leg.   Strength was 5/5 in the right arm, 4 to 4+/5 in the left arm,  4-/5 in the right leg and 2-3/5 in the left  leg  Sensory: Sensory testing was intact to the arm but she had reduced sensation to vibration and touch in the left leg.  Coordination: Cerebellar testing reveals good right finger-nose-finger reduced left finger-nose-finger and very reduced right heel-to-shin.  She could not do left heel-to-shin  Gait and station: She cannot stand or walk.  Reflexes: Deep tendon reflexes are increased, legs greater than arms and left greater than right.  There was clonus at the ankles and cross adductor responses, more intense on the left.  The plantar response was extensor on the left.    DIAGNOSTIC DATA (LABS, IMAGING, TESTING) - I reviewed patient records, labs, notes, testing and imaging myself where available.  Lab Results  Component Value Date   WBC 5.0 11/03/2017   HGB 12.1 11/03/2017   HCT 36.7 11/03/2017   MCV 96.1 11/03/2017   PLT 383 11/03/2017      Component Value Date/Time   NA 142 11/03/2017 1432   NA 143 11/02/2014 1914  K 4.2 11/03/2017 1432   K 3.8 11/02/2014 1914   CL 103 11/03/2017 1432   CL 107 11/02/2014 1914   CO2 28 11/03/2017 1432   CO2 31 11/02/2014 1914   GLUCOSE 102 (H) 11/03/2017 1432   GLUCOSE 97 11/02/2014 1914   BUN 22 (H) 11/03/2017 1432   BUN 14 11/02/2014 1914   CREATININE 0.97 11/03/2017 1432   CREATININE 1.01 11/02/2014 1914   CALCIUM 9.0 11/03/2017 1432   CALCIUM 8.7 11/02/2014 1914   PROT 7.4 11/03/2017 1432   PROT 6.8 11/02/2014 1914   ALBUMIN 3.8 11/03/2017 1432   ALBUMIN 3.4 11/02/2014 1914   AST 23 11/03/2017 1432   AST 29 11/02/2014 1914   ALT 15 11/03/2017 1432   ALT 26 11/02/2014 1914   ALKPHOS 65 11/03/2017 1432   ALKPHOS 81 11/02/2014 1914   BILITOT 0.2 (L) 11/03/2017 1432   BILITOT 0.3 11/02/2014 1914   GFRNONAA 57 (L) 11/03/2017 1432   GFRNONAA 58 (L) 11/02/2014 1914   GFRNONAA >60 06/27/2014 1844   GFRAA >60 11/03/2017 1432   GFRAA >60 11/02/2014 1914   GFRAA >60 06/27/2014 1844   Lab Results  Component Value Date    CHOL 162 08/20/2017   HDL 72 08/20/2017   LDLCALC 80 08/20/2017   LDLDIRECT 114.8 11/17/2011   TRIG 49 08/20/2017   CHOLHDL 2.3 08/20/2017   No results found for: HGBA1C No results found for: [id number] Lab Results  Component Value Date   TSH 4.008 08/20/2017       ASSESSMENT AND PLAN  Multiple sclerosis ([place])  Urinary retention  Frequent UTI  Spinal stenosis of lumbar region without neurogenic claudication  Restless leg  Muscle spasticity  Depression, unspecified depression type  Inability to walk  In summary, Mrs. Sherrow is a 72 year old woman with multiple sclerosis.  7 years after diagnosis, it is difficult for me to determine if she has primary progressive MS or secondary progressive MS.  Regardless, she has significant worsening of her gait following a fall and lumbar surgery in 2013.  Since moving to a skilled nursing facility and being taken off of baclofen, she has had much worse spasticity in her legs, especially in the left.  She has contractures of the hamstrings, left worse than right.  I will place her back on baclofen to see if that will help her spasticity and enable her to increase function.  If there is no significant benefit after a few weeks, they will call us back and I will see if we can schedule her for Botox injections in the legs.  We briefly discussed disease modifying therapies.  Although she is theoretically a candidate for Ocrevus, I would be reluctant to place her on that medication given her inability to walk and being at a higher than typical set up for pneumonia and other infections.  She will return to see me in 3 months or sooner if we decide on Botox.  She or her family should call if any new or worsening neurologic symptoms.  Thank you for asking me to see Mrs. Hall Busing.  Please let me know if I can be of further assistance with her or other patients in the future.  In addition to the comprehensive [id number] new patient visit, 35 minutes of  additional complex record review was performed including reviewing multiple office visits and personally reviewing images from 4 separate MRI scans.  Rehana Uncapher A. Felecia Shelling, MD, PhD, FAAN Certified in Neurology, Clinical Neurophysiology, Sleep Medicine, Pain Medicine and Neuroimaging  Director, [place] at [facility] [address], [place], [place] [address] [phone]

## 2018-06-19 DIAGNOSIS — R21 Rash and other nonspecific skin eruption: Secondary | ICD-10-CM | POA: Insufficient documentation

## 2018-07-02 ENCOUNTER — Telehealth: Payer: Self-pay | Admitting: Neurology

## 2018-07-02 NOTE — Telephone Encounter (Signed)
LMOM home #  that we will see if we can get Botox approved for bilat leg spasticity and call her to schedule.  Will call her back if we have any difficulty.  Spoke with son as well/fim

## 2018-07-02 NOTE — Telephone Encounter (Signed)
Pt said pain is better since PT but the jerking has not changed. She is interested in pursing botox. Please call to advise.

## 2018-07-04 ENCOUNTER — Other Ambulatory Visit: Payer: Self-pay | Admitting: Orthopedic Surgery

## 2018-07-04 DIAGNOSIS — M5412 Radiculopathy, cervical region: Secondary | ICD-10-CM

## 2018-07-09 NOTE — Telephone Encounter (Signed)
Spoke with [employer]. She would like to know if we were able to get Botox approved for the spasticity in her legs. I'll check with Danielle/fim

## 2018-07-09 NOTE — Telephone Encounter (Signed)
Pt returning RN's call, stating she would like a call back to also discus the prednisone Dr. Felecia Shelling put her on back in June. Di not wish to discuss further with me. Please call to advise

## 2018-07-10 NOTE — Telephone Encounter (Signed)
I called the patient to schedule her but she did not answer and the VM was not set up.

## 2018-07-10 NOTE — Telephone Encounter (Signed)
LMOM at [phone] for pt. to call to schedule Botox.  I also tried to reach pt. at [phone], and her son at [phone], but both of these vm's were full/fim

## 2018-07-10 NOTE — Telephone Encounter (Signed)
Noted, thank you

## 2018-07-23 ENCOUNTER — Ambulatory Visit: Payer: PPO

## 2018-07-27 ENCOUNTER — Ambulatory Visit
Admission: RE | Admit: 2018-07-27 | Discharge: 2018-07-27 | Disposition: A | Discharge status: Home / Self Care | Payer: Medicare HMO | Source: Ambulatory Visit | Attending: Orthopedic Surgery | Admitting: Orthopedic Surgery

## 2018-07-27 DIAGNOSIS — M5412 Radiculopathy, cervical region: Secondary | ICD-10-CM | POA: Diagnosis present

## 2018-07-27 DIAGNOSIS — M4802 Spinal stenosis, cervical region: Secondary | ICD-10-CM | POA: Diagnosis not present

## 2018-07-27 DIAGNOSIS — G35 Multiple sclerosis: Secondary | ICD-10-CM | POA: Diagnosis not present

## 2018-07-27 DIAGNOSIS — M50322 Other cervical disc degeneration at C5-C6 level: Secondary | ICD-10-CM | POA: Insufficient documentation

## 2018-08-06 DIAGNOSIS — M4802 Spinal stenosis, cervical region: Secondary | ICD-10-CM | POA: Insufficient documentation

## 2018-08-06 DIAGNOSIS — Z789 Other specified health status: Secondary | ICD-10-CM | POA: Insufficient documentation

## 2018-08-13 NOTE — Telephone Encounter (Signed)
Pt has called stating she hasn't heard from anyone re: Botox, pt is asking for a call back to discuss moving forward

## 2018-08-14 NOTE — Telephone Encounter (Signed)
I called the number below but she did not answer, the mailbox was full and would not accept messages.

## 2018-08-27 ENCOUNTER — Encounter: Payer: Self-pay | Admitting: Nurse Practitioner

## 2018-08-27 ENCOUNTER — Ambulatory Visit: Payer: Medicare HMO | Attending: Nurse Practitioner | Admitting: Nurse Practitioner

## 2018-08-27 ENCOUNTER — Other Ambulatory Visit: Payer: Self-pay

## 2018-08-27 DIAGNOSIS — Z79899 Other long term (current) drug therapy: Secondary | ICD-10-CM | POA: Insufficient documentation

## 2018-08-27 DIAGNOSIS — D696 Thrombocytopenia, unspecified: Secondary | ICD-10-CM | POA: Diagnosis not present

## 2018-08-27 DIAGNOSIS — E785 Hyperlipidemia, unspecified: Secondary | ICD-10-CM | POA: Insufficient documentation

## 2018-08-27 DIAGNOSIS — Z811 Family history of alcohol abuse and dependence: Secondary | ICD-10-CM | POA: Insufficient documentation

## 2018-08-27 DIAGNOSIS — Z9049 Acquired absence of other specified parts of digestive tract: Secondary | ICD-10-CM | POA: Insufficient documentation

## 2018-08-27 DIAGNOSIS — M5441 Lumbago with sciatica, right side: Secondary | ICD-10-CM

## 2018-08-27 DIAGNOSIS — M47896 Other spondylosis, lumbar region: Secondary | ICD-10-CM | POA: Diagnosis not present

## 2018-08-27 DIAGNOSIS — I509 Heart failure, unspecified: Secondary | ICD-10-CM | POA: Insufficient documentation

## 2018-08-27 DIAGNOSIS — Z8041 Family history of malignant neoplasm of ovary: Secondary | ICD-10-CM | POA: Insufficient documentation

## 2018-08-27 DIAGNOSIS — M4802 Spinal stenosis, cervical region: Secondary | ICD-10-CM | POA: Diagnosis not present

## 2018-08-27 DIAGNOSIS — M5117 Intervertebral disc disorders with radiculopathy, lumbosacral region: Secondary | ICD-10-CM | POA: Insufficient documentation

## 2018-08-27 DIAGNOSIS — M79605 Pain in left leg: Secondary | ICD-10-CM

## 2018-08-27 DIAGNOSIS — G894 Chronic pain syndrome: Secondary | ICD-10-CM | POA: Insufficient documentation

## 2018-08-27 DIAGNOSIS — E559 Vitamin D deficiency, unspecified: Secondary | ICD-10-CM | POA: Diagnosis not present

## 2018-08-27 DIAGNOSIS — Z820 Family history of epilepsy and other diseases of the nervous system: Secondary | ICD-10-CM | POA: Insufficient documentation

## 2018-08-27 DIAGNOSIS — K589 Irritable bowel syndrome without diarrhea: Secondary | ICD-10-CM | POA: Diagnosis not present

## 2018-08-27 DIAGNOSIS — Z79891 Long term (current) use of opiate analgesic: Secondary | ICD-10-CM | POA: Insufficient documentation

## 2018-08-27 DIAGNOSIS — Z833 Family history of diabetes mellitus: Secondary | ICD-10-CM | POA: Insufficient documentation

## 2018-08-27 DIAGNOSIS — M899 Disorder of bone, unspecified: Secondary | ICD-10-CM | POA: Insufficient documentation

## 2018-08-27 DIAGNOSIS — F329 Major depressive disorder, single episode, unspecified: Secondary | ICD-10-CM | POA: Insufficient documentation

## 2018-08-27 DIAGNOSIS — M1712 Unilateral primary osteoarthritis, left knee: Secondary | ICD-10-CM | POA: Insufficient documentation

## 2018-08-27 DIAGNOSIS — M25562 Pain in left knee: Secondary | ICD-10-CM

## 2018-08-27 DIAGNOSIS — M79602 Pain in left arm: Secondary | ICD-10-CM

## 2018-08-27 DIAGNOSIS — M542 Cervicalgia: Secondary | ICD-10-CM | POA: Diagnosis not present

## 2018-08-27 DIAGNOSIS — G35 Multiple sclerosis: Secondary | ICD-10-CM | POA: Diagnosis not present

## 2018-08-27 DIAGNOSIS — M79601 Pain in right arm: Secondary | ICD-10-CM | POA: Diagnosis not present

## 2018-08-27 DIAGNOSIS — Z8249 Family history of ischemic heart disease and other diseases of the circulatory system: Secondary | ICD-10-CM | POA: Insufficient documentation

## 2018-08-27 DIAGNOSIS — Z808 Family history of malignant neoplasm of other organs or systems: Secondary | ICD-10-CM | POA: Insufficient documentation

## 2018-08-27 DIAGNOSIS — Z86718 Personal history of other venous thrombosis and embolism: Secondary | ICD-10-CM | POA: Insufficient documentation

## 2018-08-27 DIAGNOSIS — E7800 Pure hypercholesterolemia, unspecified: Secondary | ICD-10-CM | POA: Diagnosis not present

## 2018-08-27 DIAGNOSIS — G629 Polyneuropathy, unspecified: Secondary | ICD-10-CM | POA: Diagnosis not present

## 2018-08-27 DIAGNOSIS — Z7901 Long term (current) use of anticoagulants: Secondary | ICD-10-CM | POA: Insufficient documentation

## 2018-08-27 DIAGNOSIS — G2581 Restless legs syndrome: Secondary | ICD-10-CM | POA: Insufficient documentation

## 2018-08-27 DIAGNOSIS — Z9889 Other specified postprocedural states: Secondary | ICD-10-CM | POA: Insufficient documentation

## 2018-08-27 DIAGNOSIS — M25561 Pain in right knee: Secondary | ICD-10-CM

## 2018-08-27 DIAGNOSIS — M5442 Lumbago with sciatica, left side: Secondary | ICD-10-CM

## 2018-08-27 DIAGNOSIS — G8929 Other chronic pain: Secondary | ICD-10-CM

## 2018-08-27 DIAGNOSIS — Z789 Other specified health status: Secondary | ICD-10-CM

## 2018-08-27 DIAGNOSIS — I2510 Atherosclerotic heart disease of native coronary artery without angina pectoris: Secondary | ICD-10-CM | POA: Diagnosis not present

## 2018-08-27 DIAGNOSIS — M79604 Pain in right leg: Secondary | ICD-10-CM

## 2018-08-27 DIAGNOSIS — N8110 Cystocele, unspecified: Secondary | ICD-10-CM | POA: Insufficient documentation

## 2018-08-27 NOTE — Progress Notes (Signed)
Patient's Name: Cynthia Price  MRN: [id number]  Referring Provider: Harvest Dark, FNP  DOB: [birth year]  PCP: [name], MD  DOS: 08/27/2018  Note by: Dionisio David NP  Service setting: Ambulatory outpatient  Specialty: Interventional Pain Management  Location: [facility] (AMB) Pain Management Facility    Patient type: New Patient    Primary Reason(s) for Visit: Initial Patient Evaluation CC: Neck Pain and Arm Pain (bilateral, right arm is worse)  HPI  [name] is a 72 y.o. year old, female patient, who comes today for an initial evaluation. 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; ARTHRITIS; FATIGUE; UNS ADVRS EFF OTH RX MEDICINAL&BIOLOGICAL SBSTNC; Thrombocythemia, essential ([place]); Edema; Tachycardia; Anemia; Diastolic dysfunction; CAD (coronary artery disease); Urinary retention; Risk for falls; DVT (deep venous thrombosis) (HCC); RLS (restless legs syndrome); Bradycardia; Hypotension; Cellulitis, toe; UTI (urinary tract infection); Chronic diastolic heart failure ([place]); Complete rotator cuff rupture of left shoulder; Constipation; Contusion of shoulder; Deep vein thrombosis ([place]); Effusion of knee; Lumbar canal stenosis; HLD (hyperlipidemia); BP (high blood pressure); Infraspinatus tenosynovitis; 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[place]); DS (disseminated sclerosis) ([place]); Arthritis of knee, degenerative; Prolapse of urethra; Frequent UTI; Restless leg; Back sprain; Chest wall contusion; Neurogenic dysfunction of the urinary bladder; Vaginal atrophy; Intractable pain; Muscle spasticity; Inability to walk; Bladder mass; Chronic deep vein thrombosis (DVT) of proximal vein of left lower extremity ([place]); Degenerative cervical spinal stenosis; Encounter for general adult medical examination without abnormal findings; History of deep vein thrombosis (DVT) of lower extremity; Lumbosacral  radiculitis; Mild protein-calorie malnutrition ([place]); Rash; Resides in skilled nursing facility; Seizure-like activity ([place]); Ulcer, skin, non-healing, limited to breakdown of skin ([place]); Spondylosis of lumbar region without myelopathy or radiculopathy; Urinary retention with incomplete bladder emptying; Uterine procidentia; Complete tear of left rotator cuff; Rotator cuff tendinitis, right; Depressive disorder, not elsewhere classified; Effusion of left knee; Recurrent UTI; Hyperlipidemia; Mild pulmonary hypertension ([place]); Hypertension; Multiple sclerosis, 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); Neuromuscular dysfunction of bladder; Prolapse urethral mucosa; Restless legs syndrome (RLS); ET (essential thrombocythemia) ([place]); Vitamin D deficiency; Primary osteoarthritis of left knee; Chronic neck pain (Primary Area of Pain) (R>L); Chronic upper extremity pain (Secondary Area of Pain) (R>L); Chronic bilateral low back pain with bilateral sciatica [facility] Area of Pain) (L>R); Chronic pain of both knees ([place] Area of Pain) (L>R); Chronic pain of both lower extremities (L>R); Chronic pain syndrome; Pharmacologic therapy; Disorder of skeletal system; Problems influencing health status; and Long term current use of opiate analgesic on their problem list.. Her primarily concern today is the Neck Pain and Arm Pain (bilateral, right arm is worse)  Pain Assessment: Location: Right, Left Neck Radiating: radiates down both arms to fingers Onset: More than a month ago Duration: Chronic pain Quality: Numbness, Dull, Aching Severity: 10-Worst pain ever/10 (subjective, self-reported pain score)  Note: Reported level is compatible with observation. Clinically the patient looks like a 3/10 A 3/10 is viewed as "Moderate" and described as significantly interfering with activities of daily living (ADL). It becomes difficult to feed, bathe, get dressed, get on and off the toilet or to perform personal hygiene functions. Difficult to get  in and out of bed or a chair without assistance. Very distracting. With effort, it can be ignored when deeply involved in activities. Information on the proper use of the pain scale provided to the patient today. When using our objective Pain Scale, levels between 6 and 10/10 are said to belong in an emergency room,  as it progressively worsens from a 6/10, described as severely limiting, requiring emergency care not usually available at an outpatient pain management facility. At a 6/10 level, communication becomes difficult and requires great effort. Assistance to reach the emergency department may be required. Facial flushing and profuse sweating along with potentially dangerous increases in heart rate and blood pressure will be evident. Effect on ADL: Limits activities Timing: Constant Modifying factors: lying down BP: 123/65  HR: 87  Onset and Duration: Gradual and Present longer than 3 months Cause of pain: MS Severity: Getting worse, NAS-11 at its worse: 10/10, NAS-11 at its best: 8/10, NAS-11 now: 10/10 and NAS-11 on the average: 9/10 Timing: Morning, Night, During activity or exercise and After activity or exercise Aggravating Factors: Bending, Bowel movements, Lifiting, Motion, Prolonged sitting, Prolonged standing, Twisting and Walking Alleviating Factors: Medications, Resting and Using a brace Associated Problems: Depression, Fatigue, Inability to control bladder (urine), Inability to control bowel, Nausea, Numbness, Sadness, Spasms, Vomiting , Weakness, Pain that wakes patient up and Pain that does not allow patient to sleep Quality of Pain: Aching, Burning, Cramping, Disabling, Distressing, Getting longer, Pressure-like, Pulsating, Sharp, Shooting, Stabbing, Throbbing, Tingling and Uncomfortable Previous Examinations or Tests: Biopsy, CT scan, MRI scan, Nerve conduction test, Orthopedic evaluation and Psychiatric evaluation Previous Treatments: Narcotic medications, Physical Therapy,  Steroid treatments by mouth, Strengthening exercises and TENS  The patient comes into the clinics today for the first time for a chronic pain management evaluation.  According to the patient her primary area of pain is in her neck.  She admits that this pain is getting worse.  She admits the right is greater than the left.  She denies any previous surgery or interventional therapy.  She is currently in physical therapy at [facility] where she resides.  Her second area pain is in her arms.  She admits that she has numbness tingling with burning going down both arms.  She admits the right is worse than the left.  She admits that it affects all of her fingers.  Admits that physical therapy makes it worse however she continues at this time.  Her third area pain is in her lower back.  She admits that he is status post spinal surgery in 2013 by Dr. Owens Shark from [facility].  He admits that she is has not been able to walk the last year and a half.  She admits that she did have difficulty initially after the surgery but this resolved.  She has had interventional therapy but states that was approximately 10 years ago.  She is doing physical therapy.  She denies any recent images.  Her fourth area of pain is in her knees.  She admits that the left is greater than the right.  She denies any previous surgery.  She has had interventional therapy which was effective.  She again continues with physical therapy and has had recent images.  Her next area of pain is just generalized leg pain.  She admits that she is not able to walk because of the pain.  She admits the pain is constant.  She does have a history of DVT behind her left knee which she is currently on Xarelto.  She admits that is been on this since 2018.  Today I took the time to provide the patient with information regarding this pain practice. The patient was informed that the practice is divided into two sections: an interventional  pain management section, as well as a completely  separate and distinct medication management section. I explained that there are procedure days for interventional therapies, and evaluation days for follow-ups and medication management. Because of the amount of documentation required during both, they are kept separated. This means that there is the possibility that she may be scheduled for a procedure on one day, and medication management the next. I have also informed her that because of staffing and facility limitations, this practice will no longer take patients for medication management only. To illustrate the reasons for this, I gave the patient the example of surgeons, and how inappropriate it would be to refer a patient to his/her care, just to write for the post-surgical antibiotics on a surgery done by a different surgeon.   Because interventional pain management is part of the board-certified specialty for the doctors, the patient was informed that joining this practice means that they are open to any and all interventional therapies. I made it clear that this does not mean that they will be forced to have any procedures done. What this means is that I believe interventional therapies to be essential part of the diagnosis and proper management of chronic pain conditions. Therefore, patients not interested in these interventional alternatives will be better served under the care of a different practitioner.  The patient was also made aware of my Comprehensive Pain Management Safety Guidelines where by joining this practice, they limit all of their nerve blocks and joint injections to those done by our practice, for as long as we are retained to manage their care. Historic Controlled Substance Pharmacotherapy Review  PMP and historical list of controlled substances: Tramadol 50 mg, oxycodone 5 mg, OxyContin ER 10 mg, morphine sulfate 100 mg, lorazepam 0.5 mg, oxycodone/acetaminophen 5/325 mg, lorazepam  1 mg, clonazepam 0.5 mg, Highest opioid analgesic regimen found: Oxycodone 5 mg 1 tablet every 4 hours (last fill date 12/08/2017) oxycodone 30 mg/day Most recent opioid analgesic: None Current opioid analgesics: None Highest recorded MME/day: 45 mg/day MME/day: 0 mg/day Medications: The patient did not bring the medication(s) to the appointment, as requested in our "New Patient Package" Pharmacodynamics: Desired effects: Analgesia: The patient reports >50% benefit. Reported improvement in function: The patient reports medication allows her to accomplish basic ADLs. Clinically meaningful improvement in function (CMIF): Sustained CMIF goals met Perceived effectiveness: Described as relatively effective, allowing for increase in activities of daily living (ADL) Undesirable effects: Side-effects or Adverse reactions: None reported Historical Monitoring: The patient  reports that she does not use drugs. List of all UDS Test(s): No results found for: MDMA, COCAINSCRNUR, PCPSCRNUR, PCPQUANT, CANNABQUANT, THCU, [place] List of all Serum Drug Screening Test(s):  No results found for: AMPHSCRSER, BARBSCRSER, BENZOSCRSER, COCAINSCRSER, PCPSCRSER, PCPQUANT, THCSCRSER, CANNABQUANT, OPIATESCRSER, OXYSCRSER, PROPOXSCRSER Historical Background Evaluation: [place] PDMP: Six (6) year initial data search conducted.               Department of public safety, offender search: Editor, commissioning Information) Non-contributory Risk Assessment Profile: Aberrant behavior: None observed or detected today Risk factors for fatal opioid overdose: None identified today Fatal overdose hazard ratio (HR): Calculation deferred Non-fatal overdose hazard ratio (HR): Calculation deferred Risk of opioid abuse or dependence: 0.7-3.0% with doses ? 36 MME/day and 6.1-26% with doses ? 120 MME/day. Substance use disorder (SUD) risk level: Pending results of Medical Psychology Evaluation for SUD Opioid risk tool (ORT) (Total Score): 1  ORT  Scoring interpretation table:  Score <3 = Low Risk for SUD  Score between 4-7 = Moderate Risk for SUD  Score >8 = High Risk for Opioid Abuse   PHQ-2 Depression Scale:  Total score: 1  PHQ-2 Scoring interpretation table: (Score and probability of major depressive disorder)  Score 0 = No depression  Score 1 = 15.4% Probability  Score 2 = 21.1% Probability  Score 3 = 38.4% Probability  Score 4 = 45.5% Probability  Score 5 = 56.4% Probability  Score 6 = 78.6% Probability   PHQ-9 Depression Scale:  Total score: 1  PHQ-9 Scoring interpretation table:  Score 0-4 = No depression  Score 5-9 = Mild depression  Score 10-14 = Moderate depression  Score 15-19 = Moderately severe depression  Score 20-27 = Severe depression (2.4 times higher risk of SUD and 2.89 times higher risk of overuse)   Pharmacologic Plan: Pending ordered tests and/or consults  Meds  The patient has a current medication list which includes the following prescription(s): ampyra, baclofen, carbidopa-levodopa, diclofenac sodium, escitalopram, gabapentin, guaifenesin, hydroxyurea, levetiracetam, metoclopramide, multivitamin, oxybutynin, oxycodone, ropinirole, tizanidine, xarelto, and ondansetron.  Current Outpatient Medications on File Prior to Visit  Medication Sig  . AMPYRA 10 MG TB12 Take 10 mg by mouth 2 (two) times daily.   . baclofen (LIORESAL) 10 MG tablet Take 1 tablet (10 mg total) by mouth 3 (three) times daily.  . carbidopa-levodopa (SINEMET IR) 25-100 MG tablet Take by mouth.  . diclofenac sodium (VOLTAREN) 1 % GEL Apply topically 4 (four) times daily.  Marland Kitchen escitalopram (LEXAPRO) 5 MG tablet   . gabapentin (NEURONTIN) 800 MG tablet Take 800 mg by mouth 4 (four) times daily.   Marland Kitchen guaifenesin (ROBITUSSIN) 100 MG/5ML syrup Take 200 mg by mouth 3 (three) times daily as needed for cough.  . hydroxyurea (HYDREA) 500 MG capsule Take one by mouth Monday, Tuesday, Wed, Thursday, Friday  . levETIRAcetam (KEPPRA) 250 MG  tablet   . metoCLOPramide (REGLAN) 5 MG tablet Take by mouth.  . Multiple Vitamin (MULTIVITAMIN) tablet Take 1 tablet by mouth daily.  Marland Kitchen oxybutynin (DITROPAN) 5 MG tablet   . oxyCODONE (OXY IR/ROXICODONE) 5 MG immediate release tablet   . rOPINIRole (REQUIP) 2 MG tablet Take 2 mg by mouth 4 (four) times daily.  Marland Kitchen tiZANidine (ZANAFLEX) 4 MG tablet   . XARELTO 20 MG TABS tablet Take 20 mg by mouth daily with breakfast.   . ondansetron (ZOFRAN) 4 MG tablet    No current facility-administered medications on file prior to visit.    Imaging Review  Cervical Imaging: Cervical MR wo contrast:  Results for orders placed during the hospital encounter of 07/27/18  MR CERVICAL SPINE WO CONTRAST   Narrative CLINICAL DATA:  Neck pain radiating into the arms with numbness and tingling for several months. History of multiple sclerosis and multiple falls.  EXAM: MRI CERVICAL SPINE WITHOUT CONTRAST  TECHNIQUE: Multiplanar, multisequence MR imaging of the cervical spine was performed. No intravenous contrast was administered.  COMPARISON:  Cervical spine CT 08/21/2017  FINDINGS: Alignment: Exaggerated upper thoracic kyphosis.  No listhesis.  Vertebrae: No fracture, suspicious osseous lesion, or significant marrow edema.  Cord: T2 hyperintense lesions in the cord on the left at C4, on the right at C5, and centrally/dorsally at C7-T1. Normal to mildly decreased cord volume at these levels.  Posterior Fossa, vertebral arteries, paraspinal tissues: Partially visualized T2 hyperintensities in the pons. Partially visualized cerebellar atrophy and or old infarcts. Preserved vertebral artery flow voids.  Disc levels:  C2-3: Minimal facet arthrosis without significant stenosis.  C3-4: Mild disc bulging, uncovertebral spurring, and mild facet  arthrosis result in mild spinal stenosis and mild bilateral neural foraminal stenosis.  C4-5: Disc bulging, uncovertebral spurring, and mild facet  arthrosis result in mild spinal stenosis and moderate right and mild left neural foraminal stenosis.  C5-6: Moderate disc space narrowing. Disc bulging, uncovertebral spurring, and mild facet arthrosis result in mild spinal stenosis and moderate to severe right and mild left neural foraminal stenosis.  C6-7: Moderate disc space narrowing. Mild disc bulging and uncovertebral spurring result in borderline left neural foraminal stenosis without spinal stenosis.  C7-T1: Negative.  IMPRESSION: 1. Cervical disc degeneration most notable at C5-6 where there is mild spinal stenosis and moderate to severe right neural foraminal stenosis. 2. Mild spinal stenosis and mild-to-moderate neural foraminal stenosis at C3-4 and C4-5. 3. Multiple cervical cord lesions consistent with the history of multiple sclerosis.   Electronically Signed   By: Logan Bores M.D.   On: 07/27/2018 16:07    Cervical CT wo contrast:  Results for orders placed during the hospital encounter of 08/19/17  CT CERVICAL SPINE WO CONTRAST   Narrative CLINICAL DATA:  Pain and weakness, fall. History of multiple sclerosis.  EXAM: CT CERVICAL SPINE WITHOUT CONTRAST  TECHNIQUE: Multidetector CT imaging of the cervical spine was performed without intravenous contrast. Multiplanar CT image reconstructions were also generated.  COMPARISON:  None.  FINDINGS: ALIGNMENT: Maintained lordosis. Vertebral bodies in alignment.  SKULL BASE AND VERTEBRAE: Cervical vertebral bodies and posterior elements are intact. Moderate to severe C5-6 and C6-7 disc height loss with endplate sclerosis and marginal spurring compatible with degenerative discs, mild at C4-5. Osteopenia without destructive bony lesions. C1-2 articulation maintained with moderate arthropathy.  SOFT TISSUES AND SPINAL CANAL: Normal.  DISC LEVELS: No significant osseous canal stenosis. Multilevel mild to moderate neural foraminal narrowing.  UPPER CHEST:  Lung apices are clear.  OTHER: None.  IMPRESSION: No acute fracture deformity or malalignment.   Electronically Signed   By: Elon Alas M.D.   On: 08/21/2017 20:15   Shoulder Imaging:  Results for orders placed during the hospital encounter of 08/19/17  DG Shoulder Left   Narrative CLINICAL DATA:  72 y/o  F; status post fall with pain.  EXAM: LEFT SHOULDER - 2+ VIEW  COMPARISON:  None.  FINDINGS: There is no evidence of fracture or dislocation. There is no evidence of arthropathy or other focal bone abnormality. Soft tissues are unremarkable.  IMPRESSION: Negative.   Electronically Signed   By: Kristine Garbe M.D.   On: 08/19/2017 17:28     Thoracic Imaging:  Thoracic DG 2-3 views:  Results for orders placed during the hospital encounter of 06/14/15  DG Thoracic Spine 2 View   Narrative CLINICAL DATA:  Patient reports a hard pain in the left chest since Friday. Pain radiates. Fall 3 weeks ago. Upper T-spine pain is worsening.  EXAM: THORACIC SPINE - 2-3 VIEWS  COMPARISON:  Chest x-ray 12 8,015  FINDINGS: There is exaggerated kyphosis of the thoracic spine. There is a wedge compression fracture of T11, favored to be chronic based on compares with previous studies. Mild degenerative changes are identified at multiple levels. No acute fractures are identified. No suspicious lytic or blastic lesions are identified.Status post lumbar spine fusion at L3-4.  IMPRESSION: 1. Chronic T11 wedge compression fracture. 2.  No evidence for acute  abnormality. 3. If there is persistent pain, follow-up images are recommended.   Electronically Signed   By: Nolon Nations M.D.   On: 06/14/2015 08:15   Hip Imaging:  Hip-R DG 2-3 views:  Results for orders placed during the hospital encounter of 11/03/17  DG Hip Unilat W or Wo Pelvis 2-3 Views Right   Narrative CLINICAL DATA:  Pain for 2 weeks  EXAM: DG HIP (WITH OR WITHOUT PELVIS) 2-3V  RIGHT  COMPARISON:  None.  FINDINGS: Frontal pelvis as well as frontal and lateral right hip images were obtained. Bones are osteoporotic. There is no appreciable fracture or dislocation. There is moderate symmetric narrowing of both hip joints. Calcification in the right pelvis most likely represents uterine leiomyomatous change. A rounded radiopaque foreign body in the lower pelvis may represent a pessary.  IMPRESSION: Moderate symmetric narrowing of both hip joints. No fracture or dislocation. Bones are osteoporotic. Apparent calcified uterine leiomyomas in right pelvis. Question pessary in midline lower pelvis.   Electronically Signed   By: Lowella Grip III M.D.   On: 11/03/2017 16:15    Hip-L DG 2-3 views:  Results for orders placed during the hospital encounter of 08/19/17  DG Hip Unilat With Pelvis 2-3 Views Left   Narrative CLINICAL DATA:  Fall this afternoon walking to bathroom. Left hip pain.  EXAM: DG HIP (WITH OR WITHOUT PELVIS) 2-3V LEFT  COMPARISON:  11/02/2014  FINDINGS: Diffuse osteopenia. Mild symmetric degenerative change of the hips. No acute fracture or dislocation. Degenerative change of the spine. Partially visualized fusion hardware over the lower lumbar spine intact. Stable calcification over the right pelvis.  IMPRESSION: No acute findings.  Mild symmetric degenerative change of the hips.   Electronically Signed   By: Marin Olp M.D.   On: 08/19/2017 17:30   Knee Imaging: . Knee-L MR w contrast:  Results for orders placed during the hospital encounter of 11/07/16  MR KNEE LEFT WO CONTRAST   Narrative CLINICAL DATA:  Chronic progressive left knee pain and swelling. Primary osteoarthritis the left knee. Left knee effusion.  EXAM: MRI OF THE LEFT KNEE WITHOUT CONTRAST  TECHNIQUE: Multiplanar, multisequence MR imaging of the knee was performed. No intravenous contrast was administered.  COMPARISON:   None.  FINDINGS: MENISCI  Medial meniscus:  Intact.  Lateral meniscus: Peripherally subluxed and diffusely degenerated but intact.  LIGAMENTS  Cruciates:  Intact ACL and PCL.  Collaterals: Medial collateral ligament is intact. Lateral collateral ligament complex is intact.  CARTILAGE  Patellofemoral: Diffuse thinning of the articular cartilage throughout the patellofemoral compartment.  Medial:  Diffuse thinning throughout the medial compartment.  Lateral: Extensive denuding of the articular cartilage in the lateral compartment with subcortical edema and prominent marginal osteophyte formation.  Joint: Large joint effusion with prominent synovitis and debris in the joint.  Popliteal Fossa:  No Baker cyst. Intact popliteus tendon.  Extensor Mechanism:  Intact quadriceps tendon and patellar tendon.  Bones:  Severe arthritic changes of the lateral compartment.  Other: None.  IMPRESSION: 1. Severe osteoarthritis of the lateral compartment of the left knee. 2. Prominent synovitis with a large joint effusion.   Electronically Signed   By: Lorriane Shire M.D.   On: 11/07/2016 16:12    Results for orders placed during the hospital encounter of 11/03/17  DG Knee Complete 4 Views Right   Narrative CLINICAL DATA:  Pain for 2 weeks  EXAM: RIGHT KNEE - COMPLETE 4+ VIEW  COMPARISON:  None.  FINDINGS: Frontal, lateral, and bilateral oblique views were obtained. No fracture or dislocation. No joint effusion. There is mild narrowing medially in the patellofemoral joint region. There is mild chondrocalcinosis laterally.  IMPRESSION: Mild narrowing medially and  in the patellofemoral joint. Mild chondrocalcinosis laterally, a finding that may be seen with osteoarthritis or with calcium pyrophosphate deposition disease. No fracture or joint effusion.   Electronically Signed   By: Lowella Grip III M.D.   On: 11/03/2017 16:16    Knee-L DG 4 views:  Results  for orders placed during the hospital encounter of 08/19/17  DG Knee Complete 4 Views Left   Narrative CLINICAL DATA:  Fall walking to bathroom this afternoon with left knee pain.  EXAM: LEFT KNEE - COMPLETE 4+ VIEW  COMPARISON:  None.  FINDINGS: Osteopenia. Mild degenerative change of the lateral compartment and patellofemoral joints. No acute fracture or dislocation. No significant joint effusion.  IMPRESSION: No acute findings.  Mild osteoarthritic change most prominent over the lateral compartment.   Electronically Signed   By: Marin Olp M.D.   On: 08/19/2017 17:31    Note: Available results from prior imaging studies were reviewed.        ROS  Cardiovascular History: Heart trouble, Abnormal heart rhythm, Daily Aspirin intake, Heart murmur and Blood thinners:  Anticoagulant Pulmonary or Respiratory History: Snoring  Neurological History: Curved spine and Incontinence:  Urinary Review of Past Neurological Studies:  Results for orders placed or performed during the hospital encounter of 08/19/17  CT HEAD WO CONTRAST   Narrative   CLINICAL DATA:  Fall.  History of multiple sclerosis.  EXAM: CT HEAD WITHOUT CONTRAST  TECHNIQUE: Contiguous axial images were obtained from the base of the skull through the vertex without intravenous contrast.  COMPARISON:  Head CT 11/02/2014  FINDINGS: Brain: No mass lesion, intraparenchymal hemorrhage or extra-axial collection. No evidence of acute cortical infarct. Periventricular white matter disease has worsened compared to the prior study. Size and configuration of ventricles are unchanged.  Vascular: No hyperdense vessel or unexpected calcification.  Skull: Normal visualized skull base, calvarium and extracranial soft tissues.  Sinuses/Orbits: No sinus fluid levels or advanced mucosal thickening. No mastoid effusion. Normal orbits.  IMPRESSION: 1. Confluent white matter disease, progressed from the prior  study and likely indicating worsening demyelinating disease. 2. No acute abnormality.   Electronically Signed   By: Ulyses Jarred M.D.   On: 08/20/2017 22:20    Psychological-Psychiatric History: Anxiousness and Depressed Gastrointestinal History: No reported gastrointestinal signs or symptoms such as vomiting or evacuating blood, reflux, heartburn, alternating episodes of diarrhea and constipation, inflamed or scarred liver, or pancreas or irrregular and/or infrequent bowel movements Genitourinary History: Peeing blood and Recurrent Urinary Tract infections Hematological History: Brusing easily Endocrine History: No reported endocrine signs or symptoms such as high or low blood sugar, rapid heart rate due to high thyroid levels, obesity or weight gain due to slow thyroid or thyroid disease Rheumatologic History: Joint aches and or swelling due to excess weight (Osteoarthritis) and Rheumatoid arthritis Musculoskeletal History: Multiple sclerosis Work History: Retired  Allergies  Ms. Ramos is allergic to atorvastatin.  Laboratory Chemistry  Inflammation Markers No results found for: CRP, ESRSEDRATE (CRP: Acute Phase) (ESR: Chronic Phase) Renal Function Markers Lab Results  Component Value Date   BUN 22 (H) 11/03/2017   CREATININE 0.97 11/03/2017   GFRAA >60 11/03/2017   GFRNONAA 57 (L) 11/03/2017   Hepatic Function Markers Lab Results  Component Value Date   AST 23 11/03/2017   ALT 15 11/03/2017   ALBUMIN 3.8 11/03/2017   ALKPHOS 65 11/03/2017   Electrolytes Lab Results  Component Value Date   NA 142 11/03/2017   K 4.2 11/03/2017   CL  103 11/03/2017   CALCIUM 9.0 11/03/2017   Neuropathy Markers No results found for: [id number] Bone Pathology Markers Lab Results  Component Value Date   ALKPHOS 65 11/03/2017   VD25OH 24 (L) 04/29/2010   CALCIUM 9.0 11/03/2017   Coagulation Parameters Lab Results  Component Value Date   INR 0.9 05/03/2012   LABPROT 12.5  05/03/2012   PLT 383 11/03/2017   Cardiovascular Markers Lab Results  Component Value Date   HGB 12.1 11/03/2017   HCT 36.7 11/03/2017   Note: Lab results reviewed.  [place]  Drug: Ms. Lentz  reports that she does not use drugs. Alcohol:  reports that she does not drink alcohol. Tobacco:  reports that she has never smoked. She has never used smokeless tobacco. Medical:  has a past medical history of Anemia (2012), Back pain, Blood clot in vein, BP (high blood pressure) (03/08/2014), CAD (coronary artery disease) (10/18/2011), Chronic diastolic heart failure ([place]) (09/07/2014), Collagen vascular disease ([place]), Depression, DVT (deep venous thrombosis) ([place]) (05/14/2012), Essential thrombocytosis ([place]), Hyperlipidemia, 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[place]) (03/05/2015), Hypotension (03/26/2013), IBS (irritable bowel syndrome), Insomnia, Lupus anticoagulant positive, Macular degeneration, MS (multiple sclerosis) ([place]), Multiple sclerosis ([place]) (04/29/2008), NEUROPATHY (04/29/2008), RLS (restless legs syndrome), Spinal stenosis, and Urinary retention. Family: family history includes Alcohol abuse in her father; Diabetes in her brother and sister; Heart disease in her father and mother; Multiple sclerosis in her cousin and paternal aunt; Ovarian cancer in her mother; Thyroid cancer in her mother.  Past Surgical History:  Procedure Laterality Date  . ANTERIOR LUMBAR FUSION  2012  . APPENDECTOMY    . CHOLECYSTECTOMY    . CHOLECYSTECTOMY    . LUMBAR DISC SURGERY    . LUMBAR LAMINECTOMY    . TONSILLECTOMY     Active Ambulatory Problems    Diagnosis Date Noted  . VITAMIN D DEFICIENCY 04/29/2010  . HYPERCHOLESTEROLEMIA 04/29/2010  . Depression 04/29/2010  . Multiple sclerosis ([place]) 04/29/2008  . NEUROPATHY 04/29/2008  . CYSTOCELE WITHOUT MENTION UTERINE PROLAPSE LAT 04/29/2010  . ARTHRITIS 04/29/2008  . FATIGUE 04/29/2008  . UNS ADVRS EFF OTH RX MEDICINAL&BIOLOGICAL SBSTNC 04/29/2010  .  Thrombocythemia, essential ([place]) 05/26/2011  . Edema 05/26/2011  . Tachycardia 10/01/2011  . Anemia 10/17/2011  . Diastolic dysfunction 26/83/4196  . CAD (coronary artery disease) 10/18/2011  . Urinary retention 12/04/2011  . Risk for falls 03/16/2012  . DVT (deep venous thrombosis) ([place]) 05/14/2012  . RLS (restless legs syndrome) 08/31/2012  . Bradycardia 03/26/2013  . Hypotension 03/26/2013  . Cellulitis, toe 08/18/2013  . UTI (urinary tract infection) 02/05/2014  . Chronic diastolic heart failure ([place]) 09/07/2014  . Complete rotator cuff rupture of left shoulder 02/05/2015  . Constipation 07/20/2012  . Contusion of shoulder 04/14/2015  . Deep vein thrombosis ([place]) 07/10/2014  . Effusion of knee 05/22/2015  . Lumbar canal stenosis 02/16/2012  . HLD (hyperlipidemia) 03/08/2014  . BP (high blood pressure) 03/08/2014  . Infraspinatus tenosynovitis 02/05/2015  . 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[place]) 03/05/2015  . DS (disseminated sclerosis) ([place]) 09/15/2015  . Arthritis of knee, degenerative 05/22/2015  . Prolapse of urethra 07/20/2012  . Frequent UTI 07/20/2012  . Restless leg 01/20/2012  . Back sprain 06/05/2015  . Chest wall contusion 06/05/2015  . Neurogenic dysfunction of the urinary bladder 07/20/2012  . Vaginal atrophy 07/20/2012  . Intractable pain 08/19/2017  . Muscle spasticity 05/17/2018  . Inability to walk 05/17/2018  . Bladder mass 01/20/2017  . Chronic deep vein thrombosis (DVT) of proximal vein of  left lower extremity ([place]) 05/16/2017  . Degenerative cervical spinal stenosis 08/06/2018  . Encounter for general adult medical examination without abnormal findings 11/24/2016  . History of deep vein thrombosis (DVT) of lower extremity 07/10/2014  . Lumbosacral radiculitis 02/16/2012  . Mild protein-calorie malnutrition ([place]) 01/03/2018  . Rash 06/19/2018  . Resides in skilled nursing facility 08/06/2018  . Seizure-like activity ([place]) 04/20/2017  .  Ulcer, skin, non-healing, limited to breakdown of skin ([place]) 12/19/2017  . Spondylosis of lumbar region without myelopathy or radiculopathy 05/23/2016  . Urinary retention with incomplete bladder emptying 12/22/2015  . 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 Complete tear of left rotator cuff 02/05/2015  . Rotator cuff tendinitis, right 06/19/2015  . Depressive disorder, not elsewhere classified 04/29/2010  . Effusion of left knee 05/22/2015  . Recurrent UTI 07/20/2012  . Hyperlipidemia 03/08/2014  . Mild pulmonary hypertension ([place]) 03/05/2015  . Hypertension 03/08/2014  . 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 Neuromuscular dysfunction of bladder 07/20/2012  . Prolapse urethral mucosa 07/20/2012  . Restless legs syndrome (RLS) 01/20/2012  . ET (essential thrombocythemia) ([place]) 12/22/2015  . Vitamin D deficiency 04/29/2010  . Primary osteoarthritis of left knee 05/22/2015  . Chronic neck pain (Primary Area of Pain) (R>L) 08/27/2018  . Chronic upper extremity pain (Secondary Area of Pain) (R>L) 08/27/2018  . Chronic bilateral low back pain with bilateral sciatica [facility] Area of Pain) (L>R) 08/27/2018  . Chronic pain of both knees (Fourth Area of Pain) (L>R) 08/27/2018  . Chronic pain of both lower extremities (L>R) 08/27/2018  . Chronic pain syndrome 08/27/2018  . Pharmacologic therapy 08/27/2018  . Disorder of skeletal system 08/27/2018  . Problems influencing health status 08/27/2018  . Long term current use of opiate analgesic 08/27/2018   Resolved Ambulatory Problems    Diagnosis Date Noted  . Rib fracture 10/17/2011   Past Medical History:  Diagnosis Date  . Back pain   . Blood clot in vein   . Collagen vascular disease ([place])   . Essential thrombocytosis ([place])   . IBS (irritable bowel syndrome)   . Insomnia   . Lupus anticoagulant positive   . Macular degeneration   . MS (multiple sclerosis) ([place])   . Spinal stenosis    Constitutional Exam   General appearance: Well nourished, well developed, and well hydrated. In no apparent acute distress Vitals:   08/27/18 1324  BP: 123/65  Pulse: 87  Resp: 18  Temp: 98.7 F (37.1 C)  SpO2: 97%  Weight: 107 lb (48.5 kg)  Height: '5\' 5"'$  (1.651 m)   BMI Assessment: Estimated body mass index is 17.81 kg/m as calculated from the following:   Height as of this encounter: '5\' 5"'$  (1.651 m).   Weight as of this encounter: 107 lb (48.5 kg).  BMI interpretation table: BMI level Category Range association with higher incidence of chronic pain  <18 kg/m2 Underweight   18.5-24.9 kg/m2 Ideal body weight   25-29.9 kg/m2 Overweight Increased incidence by 20%  30-34.9 kg/m2 Obese (Class I) Increased incidence by 68%  35-39.9 kg/m2 Severe obesity (Class II) Increased incidence by 136%  >40 kg/m2 Extreme obesity (Class III) Increased incidence by 254%   BMI Readings from Last 4 Encounters:  08/27/18 17.81 kg/m  05/17/18 18.19 kg/m  11/21/17 21.46 kg/m  11/03/17 21.46 kg/m   Wt Readings from Last 4 Encounters:  08/27/18 107 lb (48.5 kg)  05/17/18 106 lb (48.1 kg)  11/21/17 125 lb (56.7 kg)  11/03/17 125 lb (56.7 kg)  Psych/Mental status: Alert, oriented x 3 (person, place, & time)       Eyes: PERLA Respiratory: No evidence of acute respiratory distress  Cervical Spine Exam  Inspection: Paravertebral muscle atrophy Alignment: Asymmetric Functional ROM: Restricted ROM, to the left and up Stability: No instability detected Muscle strength & Tone: Functionally intact Sensory: Unimpaired Palpation: Complains of area being tender to palpation Cervical compression test      negative  Upper Extremity (UE) Exam    Side: Right upper extremity  Side: Left upper extremity  Inspection: No masses, redness, swelling, or asymmetry. No contractures  Inspection: No masses, redness, swelling, or asymmetry. No contractures  Functional ROM: Unrestricted ROM          Functional ROM: Unrestricted ROM           Muscle strength & Tone: Functionally intact  Muscle strength & Tone: Functionally intact  Sensory: Unimpaired  Sensory: Unimpaired  Palpation: No palpable anomalies              Palpation: No palpable anomalies              Specialized Test(s): Deferred         Specialized Test(s): Deferred          Thoracic Spine Exam  Inspection: prominent thoracic Kyphosis Alignment: Asymmetric Functional ROM: Restricted ROM Stability: No instability detected Sensory: Unimpaired Muscle strength & Tone: No palpable anomalies  Lumbar Spine Exam  Inspection: No masses, redness, or swelling Alignment: Symmetrical Functional ROM: Unrestricted ROM      Stability: No instability detected Muscle strength & Tone: Functionally intact Sensory: Unimpaired Palpation: No palpable anomalies       Provocative Tests: Lumbar Hyperextension and rotation test: evaluation deferred today       Patrick's Maneuver: evaluation deferred today                    Gait & Posture Assessment  Ambulation: Patient ambulates using a wheel chair Gait: Relatively normal for age and body habitus Posture: Thoracic kyphosis   Lower Extremity Exam    Side: Right lower extremity  Side: Left lower extremity  Inspection: No masses, redness, swelling, or asymmetry. No contractures  Inspection: increase in size  Functional ROM: Unrestricted ROM          Functional ROM: Limited ROM          Muscle strength & Tone: Functionally intact  Muscle strength & Tone: Movement possible against gravity, but not against resistance (3/5)  Sensory: Unimpaired  Sensory: Movement-associated pain  Palpation: No palpable anomalies  Palpation: Complains of area being tender to palpation   Assessment  Primary Diagnosis & Pertinent Problem List: Diagnoses of Chronic neck pain (Primary Area of Pain) (R>L), Chronic pain of both upper extremities, Chronic bilateral low back pain with bilateral sciatica (Tertiary Area of Pain) (L>R), Chronic pain of  both knees (Fourth Area of Pain) (L>R), Chronic pain of both lower extremities (L>R), Chronic pain syndrome, Pharmacologic therapy, Disorder of skeletal system, Problems influencing health status, and Long term current use of opiate analgesic were pertinent to this visit.  Visit Diagnosis: 1. Chronic neck pain (Primary Area of Pain) (R>L)   2. Chronic pain of both upper extremities   3. Chronic bilateral low back pain with bilateral sciatica [facility] Area of Pain) (L>R)   4. Chronic pain of both knees (Fourth Area of Pain) (L>R)   5. Chronic pain of both lower extremities (L>R)   6. Chronic pain syndrome  7. Pharmacologic therapy   8. Disorder of skeletal system   9. Problems influencing health status   10. Long term current use of opiate analgesic    Plan of Care  Initial treatment plan:  Please be advised that as per protocol, today's visit has been an evaluation only. We have not taken over the patient's controlled substance management.  Problem-specific plan: No problem-specific Assessment & Plan notes found for this encounter.  Ordered Lab-work, Procedure(s), Referral(s), & Consult(s): Orders Placed This Encounter  Procedures  . Compliance Drug Analysis, Ur  . Comp. Metabolic Panel (12)  . Magnesium  . Vitamin B12  . Sedimentation rate  . 25-Hydroxyvitamin D Lcms D2+D3  . C-reactive protein   Pharmacotherapy: Medications ordered:  No orders of the defined types were placed in this encounter.  Medications administered during this visit: Allye E. Caldas had no medications administered during this visit.   Pharmacotherapy under consideration:  Opioid Analgesics: The patient was informed that there is no guarantee that she would be a candidate for opioid analgesics. The decision will be made following CDC guidelines. This decision will be based on the results of diagnostic studies, as well as Ms. [place]'s risk profile.  Membrane stabilizer: To be determined at a later  time Muscle relaxant: To be determined at a later time NSAID: To be determined at a later time Other analgesic(s): To be determined at a later time   Interventional therapies under consideration: Ms. Molock was informed that there is no guarantee that she would be a candidate for interventional therapies. The decision will be based on the results of diagnostic studies, as well as Ms. Drouillard's risk profile.  Possible procedure(s): Diagnostic bilateral cervical epidural steroid injection Diagnostic bilateral cervical facet nerve block Diagnostic bilateral intra-articular knee injections   Provider-requested follow-up: Return for 2nd Visit, w/ Dr. Dossie Arbour.  Future Appointments  Date Time Provider [place]  09/17/2018  1:00 PM Milinda Pointer, MD [facility] None  11/13/2018  3:00 PM Sater, Nanine Means, MD [facility] None    Primary Care Physician: [name], MD Location: [facility] Note by:  Date: 08/27/2018; Time: 2:53 PM  Pain Score Disclaimer: We use the NRS-11 scale. This is a self-reported, subjective measurement of pain severity with only modest accuracy. It is used primarily to identify changes within a particular patient. It must be understood that outpatient pain scales are significantly less accurate that those used for research, where they can be applied under ideal controlled circumstances with minimal exposure to variables. In reality, the score is likely to be a combination of pain intensity and pain affect, where pain affect describes the degree of emotional arousal or changes in action readiness caused by the sensory experience of pain. Factors such as social and work situation, setting, emotional state, anxiety levels, expectation, and prior pain experience may influence pain perception and show large inter-individual differences that may also be affected by time variables.  Patient instructions provided during this  appointment: Patient Instructions   ____________________________________________________________________________________________  Appointment Policy Summary  It is our goal and responsibility to provide the medical community with assistance in the evaluation and management of patients with chronic pain. Unfortunately our resources are limited. Because we do not have an unlimited amount of time, or available appointments, we are required to closely monitor and manage their use. The following rules exist to maximize their use:  Patient's responsibilities: 1. Punctuality:  At what time should I arrive? You should be physically present in  our office 30 minutes before your scheduled appointment. Your scheduled appointment is with your assigned healthcare provider. However, it takes 5-10 minutes to be "checked-in", and another 15 minutes for the nurses to do the admission. If you arrive to our office at the time you were given for your appointment, you will end up being at least 20-25 minutes late to your appointment with the provider. 2. Tardiness:  What happens if I arrive only a few minutes after my scheduled appointment time? You will need to reschedule your appointment. The cutoff is your appointment time. This is why it is so important that you arrive at least 30 minutes before that appointment. If you have an appointment scheduled for 10:00 AM and you arrive at 10:01, you will be required to reschedule your appointment.  3. Plan ahead:  Always assume that you will encounter traffic on your way in. Plan for it. If you are dependent on a driver, make sure they understand these rules and the need to arrive early. 4. Other appointments and responsibilities:  Avoid scheduling any other appointments before or after your pain clinic appointments.  5. Be prepared:  Write down everything that you need to discuss with your healthcare provider and give this information to the admitting nurse. Write down the  medications that you will need refilled. Bring your pills and bottles (even the empty ones), to all of your appointments, except for those where a procedure is scheduled. 6. No children or pets:  Find someone to take care of them. It is not appropriate to bring them in. 7. Scheduling changes:  We request "advanced notification" of any changes or cancellations. 8. Advanced notification:  Defined as a time period of more than 24 hours prior to the originally scheduled appointment. This allows for the appointment to be offered to other patients. 9. Rescheduling:  When a visit is rescheduled, it will require the cancellation of the original appointment. For this reason they both fall within the category of "Cancellations".  10. Cancellations:  They require advanced notification. Any cancellation less than 24 hours before the  appointment will be recorded as a "No Show". 11. No Show:  Defined as an unkept appointment where the patient failed to notify or declare to the practice their intention or inability to keep the appointment.  Corrective process for repeat offenders:  1. Tardiness: Three (3) episodes of rescheduling due to late arrivals will be recorded as one (1) "No Show". 2. Cancellation or reschedule: Three (3) cancellations or rescheduling will be recorded as one (1) "No Show". 3. "No Shows": Three (3) "No Shows" within a 12 month period will result in discharge from the practice. ____________________________________________________________________________________________  ____________________________________________________________________________________________  Pain Scale  Introduction: The pain score used by this practice is the Verbal Numerical Rating Scale (VNRS-11). This is an 11-point scale. It is for adults and children 10 years or older. There are significant differences in how the pain score is reported, used, and applied. Forget everything you learned in the past and learn  this scoring system.  General Information: The scale should reflect your current level of pain. Unless you are specifically asked for the level of your worst pain, or your average pain. If you are asked for one of these two, then it should be understood that it is over the past 24 hours.  Basic Activities of Daily Living (ADL): Personal hygiene, dressing, eating, transferring, and using restroom.  Instructions: Most patients tend to report their level of pain as a combination  of two factors, their physical pain and their psychosocial pain. This last one is also known as "suffering" and it is reflection of how physical pain affects you socially and psychologically. From now on, report them separately. From this point on, when asked to report your pain level, report only your physical pain. Use the following table for reference.  Pain Clinic Pain Levels (0-5/10)  Pain Level Score  Description  No Pain 0   Mild pain 1 Nagging, annoying, but does not interfere with basic activities of daily living (ADL). Patients are able to eat, bathe, get dressed, toileting (being able to get on and off the toilet and perform personal hygiene functions), transfer (move in and out of bed or a chair without assistance), and maintain continence (able to control bladder and bowel functions). Blood pressure and heart rate are unaffected. A normal heart rate for a healthy adult ranges from 60 to 100 bpm (beats per minute).   Mild to moderate pain 2 Noticeable and distracting. Impossible to hide from other people. More frequent flare-ups. Still possible to adapt and function close to normal. It can be very annoying and may have occasional stronger flare-ups. With discipline, patients may get used to it and adapt.   Moderate pain 3 Interferes significantly with activities of daily living (ADL). It becomes difficult to feed, bathe, get dressed, get on and off the toilet or to perform personal hygiene functions. Difficult to get  in and out of bed or a chair without assistance. Very distracting. With effort, it can be ignored when deeply involved in activities.   Moderately severe pain 4 Impossible to ignore for more than a few minutes. With effort, patients may still be able to manage work or participate in some social activities. Very difficult to concentrate. Signs of autonomic nervous system discharge are evident: dilated pupils (mydriasis); mild sweating (diaphoresis); sleep interference. Heart rate becomes elevated (>115 bpm). Diastolic blood pressure (lower number) rises above 100 mmHg. Patients find relief in laying down and not moving.   Severe pain 5 Intense and extremely unpleasant. Associated with frowning face and frequent crying. Pain overwhelms the senses.  Ability to do any activity or maintain social relationships becomes significantly limited. Conversation becomes difficult. Pacing back and forth is common, as getting into a comfortable position is nearly impossible. Pain wakes you up from deep sleep. Physical signs will be obvious: pupillary dilation; increased sweating; goosebumps; brisk reflexes; cold, clammy hands and feet; nausea, vomiting or dry heaves; loss of appetite; significant sleep disturbance with inability to fall asleep or to remain asleep. When persistent, significant weight loss is observed due to the complete loss of appetite and sleep deprivation.  Blood pressure and heart rate becomes significantly elevated. Caution: If elevated blood pressure triggers a pounding headache associated with blurred vision, then the patient should immediately seek attention at an urgent or emergency care unit, as these may be signs of an impending stroke.    Emergency Department Pain Levels (6-10/10)  Emergency Room Pain 6 Severely limiting. Requires emergency care and should not be seen or managed at an outpatient pain management facility. Communication becomes difficult and requires great effort. Assistance to  reach the emergency department may be required. Facial flushing and profuse sweating along with potentially dangerous increases in heart rate and blood pressure will be evident.   Distressing pain 7 Self-care is very difficult. Assistance is required to transport, or use restroom. Assistance to reach the emergency department will be required. Tasks requiring coordination, such  as bathing and getting dressed become very difficult.   Disabling pain 8 Self-care is no longer possible. At this level, pain is disabling. The individual is unable to do even the most "basic" activities such as walking, eating, bathing, dressing, transferring to a bed, or toileting. Fine motor skills are lost. It is difficult to think clearly.   Incapacitating pain 9 Pain becomes incapacitating. Thought processing is no longer possible. Difficult to remember your own name. Control of movement and coordination are lost.   The worst pain imaginable 10 At this level, most patients pass out from pain. When this level is reached, collapse of the autonomic nervous system occurs, leading to a sudden drop in blood pressure and heart rate. This in turn results in a temporary and dramatic drop in blood flow to the brain, leading to a loss of consciousness. Fainting is one of the body's self defense mechanisms. Passing out puts the brain in a calmed state and causes it to shut down for a while, in order to begin the healing process.    Summary: 1. Refer to this scale when providing [place] with your pain level. 2. Be accurate and careful when reporting your pain level. This will help with your care. 3. Over-reporting your pain level will lead to loss of credibility. 4. Even a level of 1/10 means that there is pain and will be treated at our facility. 5. High, inaccurate reporting will be documented as "Symptom Exaggeration", leading to loss of credibility and suspicions of possible secondary gains such as obtaining more narcotics, or wanting  to appear disabled, for fraudulent reasons. 6. Only pain levels of 5 or below will be seen at our facility. 7. Pain levels of 6 and above will be sent to the Emergency Department and the appointment cancelled. ____________________________________________________________________________________________

## 2018-08-27 NOTE — Patient Instructions (Signed)

## 2018-08-27 NOTE — Progress Notes (Signed)
Safety precautions to be maintained throughout the outpatient stay will include: orient to surroundings, keep bed in low position, maintain call bell within reach at all times, provide assistance with transfer out of bed and ambulation.  

## 2018-08-31 LAB — 25-HYDROXYVITAMIN D LCMS D2+D3
25-HYDROXY, VITAMIN D-3: 38 ng/mL
25-HYDROXY, VITAMIN D: 38 ng/mL

## 2018-08-31 LAB — COMP. METABOLIC PANEL (12)
ALBUMIN: 4.4 g/dL (ref 3.5–4.8)
AST: 34 IU/L (ref 0–40)
Albumin/Globulin Ratio: 1.8 (ref 1.2–2.2)
Alkaline Phosphatase: 88 IU/L (ref 39–117)
BUN/Creatinine Ratio: 26 (ref 12–28)
BUN: 22 mg/dL (ref 8–27)
Bilirubin Total: <0.2 mg/dL (ref 0.0–1.2)
CALCIUM: 9.3 mg/dL (ref 8.7–10.3)
CREATININE: 0.86 mg/dL (ref 0.57–1.00)
Chloride: 100 mmol/L (ref 96–106)
GFR, EST AFRICAN AMERICAN: 78 mL/min/{1.73_m2} (ref >59)
GFR, EST NON AFRICAN AMERICAN: 68 mL/min/{1.73_m2} (ref >59)
GLUCOSE: 114 mg/dL — AB (ref 65–99)
Globulin, Total: 2.4 g/dL (ref 1.5–4.5)
Potassium: 4.4 mmol/L (ref 3.5–5.2)
SODIUM: 142 mmol/L (ref 134–144)
TOTAL PROTEIN: 6.8 g/dL (ref 6.0–8.5)

## 2018-08-31 LAB — COMPLIANCE DRUG ANALYSIS, UR

## 2018-08-31 LAB — VITAMIN B12: Vitamin B-12: 492 pg/mL (ref 232–1245)

## 2018-08-31 LAB — MAGNESIUM: Magnesium: 2.2 mg/dL (ref 1.6–2.3)

## 2018-08-31 LAB — SEDIMENTATION RATE: Sed Rate: 43 mm/hr — ABNORMAL HIGH (ref 0–40)

## 2018-08-31 LAB — C-REACTIVE PROTEIN: CRP: 2 mg/L (ref 0–10)

## 2018-09-13 NOTE — Progress Notes (Signed)
Patient's Name: Cynthia Price  MRN: [id number]  Referring Provider: [name], MD  DOB: Jan 09, 1946  PCP: [name], MD  DOS: 09/17/2018  Note by: Gaspar Cola, MD  Service setting: Ambulatory outpatient  Specialty: Interventional Pain Management  Location: [facility] (AMB) Pain Management Facility    Patient type: Established   Primary Reason(s) for Visit: Encounter for evaluation before starting new chronic pain management plan of care (Level of risk: moderate) CC: Shoulder Pain (bilateral, worse on right side)  HPI  Cynthia Price is a 72 y.o. year old, female patient, who comes today for a follow-up evaluation to review the test results and decide on a treatment plan. 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; ARTHRITIS; FATIGUE; UNS ADVRS EFF OTH RX MEDICINAL&BIOLOGICAL SBSTNC; Thrombocythemia, essential ([place]); Edema; Tachycardia; Anemia; Diastolic dysfunction; CAD (coronary artery disease); Urinary retention; Risk for falls; DVT (deep venous thrombosis) (HCC); RLS (restless legs syndrome); Bradycardia; Hypotension; Cellulitis, toe; UTI (urinary tract infection); Chronic diastolic heart failure ([place]); Complete rotator cuff rupture of shoulder (Left); Constipation; Contusion of shoulder; Deep vein thrombosis ([place]); Effusion of knee; Lumbar canal stenosis; HLD (hyperlipidemia); BP (high blood pressure); Infraspinatus tenosynovitis; 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[place]); DS (disseminated sclerosis) ([place]); Arthritis of knee, degenerative; Prolapse of urethra; Frequent UTI; Restless leg; Back sprain; Chest wall contusion; Neurogenic dysfunction of the urinary bladder; Vaginal atrophy; Intractable pain; Muscle spasticity; Inability to walk; Bladder mass; Chronic deep vein thrombosis (DVT) of proximal vein of left lower extremity ([place]); Degenerative cervical spinal stenosis; Encounter for general  adult medical examination without abnormal findings; History of deep vein thrombosis (DVT) of lower extremity; Lumbosacral radiculitis; Mild protein-calorie malnutrition ([place]); Rash; Resides in skilled nursing facility; Seizure-like activity ([place]); Ulcer, skin, non-healing, limited to breakdown of skin ([place]); Spondylosis of lumbar region without myelopathy or radiculopathy; Urinary retention with incomplete bladder emptying; Uterine procidentia; Complete tear of left rotator cuff; Rotator cuff tendinitis (Right); Depressive disorder, not elsewhere classified; Effusion of left knee; Recurrent UTI; Hyperlipidemia; Mild pulmonary hypertension ([place]); Hypertension; Multiple sclerosis, 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[place]); Neuromuscular dysfunction of bladder; Prolapse urethral mucosa; Restless legs syndrome (RLS); ET (essential thrombocythemia) ([place]); Vitamin D deficiency; Osteoarthritis of knee (Left); Chronic neck pain (Primary Area of Pain) (Bilateral) (R>L); Chronic low back pain (Tertiary Area of Pain) (Bilateral) (L>R) w/ sciatica (Bilateral); Chronic knee pain (Fourth Area of Pain) (Bilateral) (L>R); Chronic lower extremity pain (Bilateral) (L>R); Chronic pain syndrome; Pharmacologic therapy; Disorder of skeletal system; Problems influencing health status; Long term current use of opiate analgesic; Chronic upper extremity pain (Secondary Area of Pain) (Bilateral) (R>L); Long term current use of anticoagulant therapy (Xarelto); and DDD (degenerative disc disease), cervical on their problem list. Her primarily concern today is the Shoulder Pain (bilateral, worse on right side)  Pain Assessment: Location:   Shoulder Radiating: pain down both arms, with numbness in hands Onset: More than a month ago Duration: Chronic pain Quality: Aching, Sharp Severity: 5 /10 (subjective, self-reported pain score)  Note: Reported level is inconsistent with clinical observations. Clinically the patient looks like a 2/10 A 2/10 is viewed  as "Mild to Moderate" and described as noticeable and distracting. Impossible to hide from other people. More frequent flare-ups. Still possible to adapt and function close to normal. It can be very annoying and may have occasional stronger flare-ups. With discipline, patients may get used to it and adapt. Information on the proper use of the pain scale provided to the patient today. When using  our objective Pain Scale, levels between 6 and 10/10 are said to belong in an emergency room, as it progressively worsens from a 6/10, described as severely limiting, requiring emergency care not usually available at an outpatient pain management facility. At a 6/10 level, communication becomes difficult and requires great effort. Assistance to reach the emergency department may be required. Facial flushing and profuse sweating along with potentially dangerous increases in heart rate and blood pressure will be evident. Timing: Intermittent Modifying factors: Gabapentin, Oxycodone, lying down BP: 110/66  HR: 82  Cynthia Price comes in today for a follow-up visit after her initial evaluation on 08/27/2018. Today we went over the results of her tests. These were explained in "Layman's terms". During today's appointment we went over my diagnostic impression, as well as the proposed treatment plan.  According to the patient her primary area of pain is in her neck.  She admits that this pain is getting worse.  She admits the right is greater than the left.  She denies any previous surgery or interventional therapy.  She is currently in physical therapy at [facility] where she resides.  Her second area pain is in her arms.  She admits that she has numbness tingling with burning going down both arms.  She admits the right is worse than the left.  She admits that it affects all of her fingers.  Admits that physical therapy makes it worse however she continues at this time.  Her third area pain is in her lower back.  She  admits that he is status post spinal surgery in 2013 by Dr. Owens Shark from [facility].  He admits that she is has not been able to walk the last year and a half.  She admits that she did have difficulty initially after the surgery but this resolved.  She has had interventional therapy but states that was approximately 10 years ago.  She is doing physical therapy.  She denies any recent images.  Her fourth area of pain is in her knees.  She admits that the left is greater than the right.  She denies any previous surgery.  She has had interventional therapy which was effective.  She again continues with physical therapy and has had recent images.  Her next area of pain is just generalized leg pain.  She admits that she is not able to walk because of the pain.  She admits the pain is constant.  She does have a history of DVT behind her left knee which she is currently on Xarelto.  She admits that is been on this since 2018.  In considering the treatment plan options, Ms. Feng was reminded that I no longer take patients for medication management only. I asked her to let me know if she had no intention of taking advantage of the interventional therapies, so that we could make arrangements to provide this space to someone interested. I also made it clear that undergoing interventional therapies for the purpose of getting pain medications is very inappropriate on the part of a patient, and it will not be tolerated in this practice. This type of behavior would suggest true addiction and therefore it requires referral to an addiction specialist.   Further details on both, my assessment(s), as well as the proposed treatment plan, please see below.  Controlled Substance Pharmacotherapy Assessment REMS (Risk Evaluation and Mitigation Strategy)  Analgesic: None Highest recorded MME/day: 45 mg/day MME/day: 0 mg/day Pill Count: None expected due to no  prior prescriptions written by our  practice. Landis Martins, RN  09/17/2018  1:19 PM  Sign at close encounter Safety precautions to be maintained throughout the outpatient stay will include: orient to surroundings, keep bed in low position, maintain call bell within reach at all times, provide assistance with transfer out of bed and ambulation.    Pharmacokinetics: Liberation and absorption (onset of action): WNL Distribution (time to peak effect): WNL Metabolism and excretion (duration of action): WNL         Pharmacodynamics: Desired effects: Analgesia: Ms. Delahunty reports >50% benefit. Functional ability: Patient reports that medication allows her to accomplish basic ADLs Clinically meaningful improvement in function (CMIF): Sustained CMIF goals met Perceived effectiveness: Described as relatively effective, allowing for increase in activities of daily living (ADL) Undesirable effects: Side-effects or Adverse reactions: None reported Monitoring: [place] PMP: Online review of the past 35-monthperiod previously conducted. Not applicable at this point since we have not taken over the patient's medication management yet. List of other Serum/Urine Drug Screening Test(s):  No results found. List of all UDS test(s) done:  Lab Results  Component Value Date   SUMMARY FINAL 08/27/2018   Last UDS on record: Summary  Date Value Ref Range Status  08/27/2018 FINAL  Final    Comment:    ==================================================================== TOXASSURE COMP DRUG ANALYSIS,UR ==================================================================== Test                             Result       Flag       Units Drug Present and Declared for Prescription Verification   Oxycodone                      995          EXPECTED   ng/mg creat   Oxymorphone                    945          EXPECTED   ng/mg creat   Noroxycodone                   4186         EXPECTED   ng/mg creat   Noroxymorphone                 409          EXPECTED    ng/mg creat    Sources of oxycodone are scheduled prescription medications.    Oxymorphone, noroxycodone, and noroxymorphone are expected    metabolites of oxycodone. Oxymorphone is also available as a    scheduled prescription medication.   Gabapentin                     PRESENT      EXPECTED   Baclofen                       PRESENT      EXPECTED   Citalopram                     PRESENT      EXPECTED   Desmethylcitalopram            PRESENT      EXPECTED    Desmethylcitalopram is an expected metabolite of citalopram or    the enantiomeric form, escitalopram. Drug Present not Declared  for Prescription Verification   Acetaminophen                  PRESENT      UNEXPECTED Drug Absent but Declared for Prescription Verification   Levetiracetam                  Not Detected UNEXPECTED   Tizanidine                     Not Detected UNEXPECTED    Tizanidine, as indicated in the declared medication list, is not    always detected even when used as directed.   Diclofenac                     Not Detected UNEXPECTED    Diclofenac, as indicated in the declared medication list, is not    always detected even when used as directed.   Guaifenesin                    Not Detected UNEXPECTED ==================================================================== Test                      Result    Flag   Units      Ref Range   Creatinine              22               mg/dL      >=20 ==================================================================== Declared Medications:  The flagging and interpretation on this report are based on the  following declared medications.  Unexpected results may arise from  inaccuracies in the declared medications.  **Note: The testing scope of this panel includes these medications:  Baclofen  Escitalopram  Gabapentin  Guaifenesin  Levetiracetam  Oxycodone  **Note: The testing scope of this panel does not include small to  moderate amounts of these reported  medications:  Diclofenac  Tizanidine  **Note: The testing scope of this panel does not include following  reported medications:  Carbidopa (Carbidopa/Levodopa)  Dalfampridine  Hydroxyurea  Levodopa (Carbidopa/Levodopa)  Metoclopramide  Multivitamin  Ondansetron  Oxybutynin  Rivaroxaban  Ropinirole ==================================================================== For clinical consultation, please call [phone]. ====================================================================    UDS interpretation: Unexpected findings not considered significantly abnormal.          Medication Assessment Form: Patient introduced to form today Treatment compliance: Treatment may start today if patient agrees with proposed plan. Evaluation of compliance is not applicable at this point Risk Assessment Profile: Aberrant behavior: See initial evaluations. None observed or detected today Comorbid factors increasing risk of overdose: See initial evaluation. No additional risks detected today Risk of substance use disorder (SUD): Low  ORT Scoring interpretation table:  Score <3 = Low Risk for SUD  Score between 4-7 = Moderate Risk for SUD  Score >8 = High Risk for Opioid Abuse   Risk Mitigation Strategies:  Patient opioid safety counseling: Completed today. Counseling provided to patient as per "Patient Counseling Document". Document signed by patient, attesting to counseling and understanding Patient-Prescriber Agreement (PPA): Obtained today.  Controlled substance notification to other providers: Written and sent today.  Pharmacologic Plan: Today we may be taking over the patient's pharmacological regimen. See below.             Laboratory Chemistry  Inflammation Markers (CRP: Acute Phase) (ESR: Chronic Phase) Lab Results  Component Value Date   CRP 2 08/27/2018   ESRSEDRATE 43 (H) 08/27/2018  Rheumatology Markers No results found.  Renal Function  Markers Lab Results  Component Value Date   BUN 22 08/27/2018   CREATININE 0.86 08/27/2018   BCR 26 08/27/2018   GFRAA 78 08/27/2018   GFRNONAA 68 08/27/2018                             Hepatic Function Markers Lab Results  Component Value Date   AST 34 08/27/2018   ALT 15 11/03/2017   ALBUMIN 4.4 08/27/2018   ALKPHOS 88 08/27/2018   LIPASE 30 06/29/2012                        Electrolytes Lab Results  Component Value Date   NA 142 08/27/2018   K 4.4 08/27/2018   CL 100 08/27/2018   CALCIUM 9.3 08/27/2018   MG 2.2 08/27/2018                        Neuropathy Markers Lab Results  Component Value Date   VITAMINB12 492 08/27/2018                        CNS Tests No results found.  Bone Pathology Markers Lab Results  Component Value Date   VD25OH 24 (L) 04/29/2010   25OHVITD1 38 08/27/2018   25OHVITD2 <1.0 08/27/2018   25OHVITD3 38 08/27/2018                         Coagulation Parameters Lab Results  Component Value Date   INR 0.9 05/03/2012   LABPROT 12.5 05/03/2012   PLT 383 11/03/2017                        Cardiovascular Markers Lab Results  Component Value Date   TROPONINI <0.03 06/14/2015   HGB 12.1 11/03/2017   HCT 36.7 11/03/2017                         CA Markers No results found.  Note: Lab results reviewed.  Recent Diagnostic Imaging Review  Cervical Imaging: Cervical MR wo contrast:  Results for orders placed during the hospital encounter of 07/27/18  MR CERVICAL SPINE WO CONTRAST   Narrative CLINICAL DATA:  Neck pain radiating into the arms with numbness and tingling for several months. History of multiple sclerosis and multiple falls.  EXAM: MRI CERVICAL SPINE WITHOUT CONTRAST  TECHNIQUE: Multiplanar, multisequence MR imaging of the cervical spine was performed. No intravenous contrast was administered.  COMPARISON:  Cervical spine CT 08/21/2017  FINDINGS: Alignment: Exaggerated upper thoracic kyphosis.  No  listhesis.  Vertebrae: No fracture, suspicious osseous lesion, or significant marrow edema.  Cord: T2 hyperintense lesions in the cord on the left at C4, on the right at C5, and centrally/dorsally at C7-T1. Normal to mildly decreased cord volume at these levels.  Posterior Fossa, vertebral arteries, paraspinal tissues: Partially visualized T2 hyperintensities in the pons. Partially visualized cerebellar atrophy and or old infarcts. Preserved vertebral artery flow voids.  Disc levels:  C2-3: Minimal facet arthrosis without significant stenosis.  C3-4: Mild disc bulging, uncovertebral spurring, and mild facet arthrosis result in mild spinal stenosis and mild bilateral neural foraminal stenosis.  C4-5: Disc bulging, uncovertebral spurring, and mild facet arthrosis result in mild spinal stenosis and moderate right and mild left  neural foraminal stenosis.  C5-6: Moderate disc space narrowing. Disc bulging, uncovertebral spurring, and mild facet arthrosis result in mild spinal stenosis and moderate to severe right and mild left neural foraminal stenosis.  C6-7: Moderate disc space narrowing. Mild disc bulging and uncovertebral spurring result in borderline left neural foraminal stenosis without spinal stenosis.  C7-T1: Negative.  IMPRESSION: 1. Cervical disc degeneration most notable at C5-6 where there is mild spinal stenosis and moderate to severe right neural foraminal stenosis. 2. Mild spinal stenosis and mild-to-moderate neural foraminal stenosis at C3-4 and C4-5. 3. Multiple cervical cord lesions consistent with the history of multiple sclerosis.   Electronically Signed   By: Logan Bores M.D.   On: 07/27/2018 16:07    Cervical MR w/wo contrast:  Results for orders placed in visit on 09/26/11  MR Cervical Spine W Wo Contrast   Cervical CT wo contrast:  Results for orders placed during the hospital encounter of 08/19/17  CT CERVICAL SPINE WO CONTRAST    Narrative CLINICAL DATA:  Pain and weakness, fall. History of multiple sclerosis.  EXAM: CT CERVICAL SPINE WITHOUT CONTRAST  TECHNIQUE: Multidetector CT imaging of the cervical spine was performed without intravenous contrast. Multiplanar CT image reconstructions were also generated.  COMPARISON:  None.  FINDINGS: ALIGNMENT: Maintained lordosis. Vertebral bodies in alignment.  SKULL BASE AND VERTEBRAE: Cervical vertebral bodies and posterior elements are intact. Moderate to severe C5-6 and C6-7 disc height loss with endplate sclerosis and marginal spurring compatible with degenerative discs, mild at C4-5. Osteopenia without destructive bony lesions. C1-2 articulation maintained with moderate arthropathy.  SOFT TISSUES AND SPINAL CANAL: Normal.  DISC LEVELS: No significant osseous canal stenosis. Multilevel mild to moderate neural foraminal narrowing.  UPPER CHEST: Lung apices are clear.  OTHER: None.  IMPRESSION: No acute fracture deformity or malalignment.   Electronically Signed   By: Elon Alas M.D.   On: 08/21/2017 20:15    Cervical DG complete:  Results for orders placed in visit on 09/26/11  DG Cervical Spine Complete   Shoulder Imaging: Shoulder-L DG:  Results for orders placed during the hospital encounter of 08/19/17  DG Shoulder Left   Narrative CLINICAL DATA:  72 y/o  F; status post fall with pain.  EXAM: LEFT SHOULDER - 2+ VIEW  COMPARISON:  None.  FINDINGS: There is no evidence of fracture or dislocation. There is no evidence of arthropathy or other focal bone abnormality. Soft tissues are unremarkable.  IMPRESSION: Negative.   Electronically Signed   By: Kristine Garbe M.D.   On: 08/19/2017 17:28    Thoracic Imaging: Thoracic DG 2-3 views:  Results for orders placed during the hospital encounter of 06/14/15  DG Thoracic Spine 2 View   Narrative CLINICAL DATA:  Patient reports a hard pain in the left chest  since Friday. Pain radiates. Fall 3 weeks ago. Upper T-spine pain is worsening.  EXAM: THORACIC SPINE - 2-3 VIEWS  COMPARISON:  Chest x-ray 12 8,015  FINDINGS: There is exaggerated kyphosis of the thoracic spine. There is a wedge compression fracture of T11, favored to be chronic based on compares with previous studies. Mild degenerative changes are identified at multiple levels. No acute fractures are identified. No suspicious lytic or blastic lesions are identified.Status post lumbar spine fusion at L3-4.  IMPRESSION: 1. Chronic T11 wedge compression fracture. 2.  No evidence for acute  abnormality. 3. If there is persistent pain, follow-up images are recommended.   Electronically Signed   By: Nolon Nations M.D.  On: 06/14/2015 08:15    Lumbosacral Imaging: Lumbar MR w/wo contrast:  Results for orders placed in visit on 09/26/11  MR Lumbar Spine W Wo Contrast   Hip Imaging: Hip-R DG 2-3 views:  Results for orders placed during the hospital encounter of 11/03/17  DG Hip Unilat W or Wo Pelvis 2-3 Views Right   Narrative CLINICAL DATA:  Pain for 2 weeks  EXAM: DG HIP (WITH OR WITHOUT PELVIS) 2-3V RIGHT  COMPARISON:  None.  FINDINGS: Frontal pelvis as well as frontal and lateral right hip images were obtained. Bones are osteoporotic. There is no appreciable fracture or dislocation. There is moderate symmetric narrowing of both hip joints. Calcification in the right pelvis most likely represents uterine leiomyomatous change. A rounded radiopaque foreign body in the lower pelvis may represent a pessary.  IMPRESSION: Moderate symmetric narrowing of both hip joints. No fracture or dislocation. Bones are osteoporotic. Apparent calcified uterine leiomyomas in right pelvis. Question pessary in midline lower pelvis.   Electronically Signed   By: Lowella Grip III M.D.   On: 11/03/2017 16:15    Hip-L DG 2-3 views:  Results for orders placed during the  hospital encounter of 08/19/17  DG Hip Unilat With Pelvis 2-3 Views Left   Narrative CLINICAL DATA:  Fall this afternoon walking to bathroom. Left hip pain.  EXAM: DG HIP (WITH OR WITHOUT PELVIS) 2-3V LEFT  COMPARISON:  11/02/2014  FINDINGS: Diffuse osteopenia. Mild symmetric degenerative change of the hips. No acute fracture or dislocation. Degenerative change of the spine. Partially visualized fusion hardware over the lower lumbar spine intact. Stable calcification over the right pelvis.  IMPRESSION: No acute findings.  Mild symmetric degenerative change of the hips.   Electronically Signed   By: Marin Olp M.D.   On: 08/19/2017 17:30    Knee Imaging: Knee-L MR w contrast:  Results for orders placed during the hospital encounter of 11/07/16  MR KNEE LEFT WO CONTRAST   Narrative CLINICAL DATA:  Chronic progressive left knee pain and swelling. Primary osteoarthritis the left knee. Left knee effusion.  EXAM: MRI OF THE LEFT KNEE WITHOUT CONTRAST  TECHNIQUE: Multiplanar, multisequence MR imaging of the knee was performed. No intravenous contrast was administered.  COMPARISON:  None.  FINDINGS: MENISCI  Medial meniscus:  Intact.  Lateral meniscus: Peripherally subluxed and diffusely degenerated but intact.  LIGAMENTS  Cruciates:  Intact ACL and PCL.  Collaterals: Medial collateral ligament is intact. Lateral collateral ligament complex is intact.  CARTILAGE  Patellofemoral: Diffuse thinning of the articular cartilage throughout the patellofemoral compartment.  Medial:  Diffuse thinning throughout the medial compartment.  Lateral: Extensive denuding of the articular cartilage in the lateral compartment with subcortical edema and prominent marginal osteophyte formation.  Joint: Large joint effusion with prominent synovitis and debris in the joint.  Popliteal Fossa:  No Baker cyst. Intact popliteus tendon.  Extensor Mechanism:  Intact quadriceps  tendon and patellar tendon.  Bones:  Severe arthritic changes of the lateral compartment.  Other: None.  IMPRESSION: 1. Severe osteoarthritis of the lateral compartment of the left knee. 2. Prominent synovitis with a large joint effusion.   Electronically Signed   By: Lorriane Shire M.D.   On: 11/07/2016 16:12    Knee-R DG 4 views:  Results for orders placed during the hospital encounter of 11/03/17  DG Knee Complete 4 Views Right   Narrative CLINICAL DATA:  Pain for 2 weeks  EXAM: RIGHT KNEE - COMPLETE 4+ VIEW  COMPARISON:  None.  FINDINGS: Frontal, lateral, and bilateral oblique views were obtained. No fracture or dislocation. No joint effusion. There is mild narrowing medially in the patellofemoral joint region. There is mild chondrocalcinosis laterally.  IMPRESSION: Mild narrowing medially and in the patellofemoral joint. Mild chondrocalcinosis laterally, a finding that may be seen with osteoarthritis or with calcium pyrophosphate deposition disease. No fracture or joint effusion.   Electronically Signed   By: Lowella Grip III M.D.   On: 11/03/2017 16:16    Knee-L DG 4 views:  Results for orders placed during the hospital encounter of 08/19/17  DG Knee Complete 4 Views Left   Narrative CLINICAL DATA:  Fall walking to bathroom this afternoon with left knee pain.  EXAM: LEFT KNEE - COMPLETE 4+ VIEW  COMPARISON:  None.  FINDINGS: Osteopenia. Mild degenerative change of the lateral compartment and patellofemoral joints. No acute fracture or dislocation. No significant joint effusion.  IMPRESSION: No acute findings.  Mild osteoarthritic change most prominent over the lateral compartment.   Electronically Signed   By: Marin Olp M.D.   On: 08/19/2017 17:31    Complexity Note: Imaging results reviewed. Results shared with [name], using Layman's terms.                         Meds   Current Outpatient Medications:  .  AMPYRA 10 MG  TB12, Take 10 mg by mouth 2 (two) times daily. , Disp: , Rfl:  .  baclofen (LIORESAL) 10 MG tablet, Take 1 tablet (10 mg total) by mouth 3 (three) times daily., Disp: 90 each, Rfl: 11 .  carbidopa-levodopa (SINEMET IR) 25-100 MG tablet, Take by mouth., Disp: , Rfl:  .  diclofenac sodium (VOLTAREN) 1 % GEL, Apply topically 4 (four) times daily., Disp: , Rfl:  .  escitalopram (LEXAPRO) 5 MG tablet, , Disp: , Rfl:  .  gabapentin (NEURONTIN) 800 MG tablet, Take 800 mg by mouth 4 (four) times daily. , Disp: , Rfl:  .  guaifenesin (ROBITUSSIN) 100 MG/5ML syrup, Take 200 mg by mouth 3 (three) times daily as needed for cough., Disp: , Rfl:  .  hydroxyurea (HYDREA) 500 MG capsule, Take one by mouth Monday, Tuesday, Wed, Thursday, Friday, Disp: 90 capsule, Rfl: 3 .  levETIRAcetam (KEPPRA) 250 MG tablet, , Disp: , Rfl:  .  metoCLOPramide (REGLAN) 5 MG tablet, Take by mouth., Disp: , Rfl:  .  Multiple Vitamin (MULTIVITAMIN) tablet, Take 1 tablet by mouth daily., Disp: , Rfl:  .  ondansetron (ZOFRAN) 4 MG tablet, , Disp: , Rfl:  .  oxybutynin (DITROPAN) 5 MG tablet, , Disp: , Rfl:  .  [START ON 11/17/2018] oxyCODONE (OXY IR/ROXICODONE) 5 MG immediate release tablet, Take 1 tablet (5 mg total) by mouth every 6 (six) hours as needed for severe pain., Disp: 120 tablet, Rfl: 0 .  rOPINIRole (REQUIP) 2 MG tablet, Take 2 mg by mouth 4 (four) times daily., Disp: , Rfl:  .  tiZANidine (ZANAFLEX) 4 MG tablet, , Disp: , Rfl:  .  XARELTO 20 MG TABS tablet, Take 20 mg by mouth daily with breakfast. , Disp: , Rfl:  .  [START ON 10/18/2018] oxyCODONE (OXY IR/ROXICODONE) 5 MG immediate release tablet, Take 1 tablet (5 mg total) by mouth every 6 (six) hours as needed for severe pain., Disp: 120 tablet, Rfl: 0 .  oxyCODONE (OXY IR/ROXICODONE) 5 MG immediate release tablet, Take 1 tablet (5 mg total) by mouth every 6 (six) hours as  needed for severe pain., Disp: 120 tablet, Rfl: 0  ROS  Constitutional: Denies any fever or  chills Gastrointestinal: No reported hemesis, hematochezia, vomiting, or acute GI distress Musculoskeletal: Denies any acute onset joint swelling, redness, loss of ROM, or weakness Neurological: No reported episodes of acute onset apraxia, aphasia, dysarthria, agnosia, amnesia, paralysis, loss of coordination, or loss of consciousness  Allergies  Ms. Hemsley is allergic to atorvastatin.  [place]  Drug: Ms. Smolinski  reports that she does not use drugs. Alcohol:  reports that she does not drink alcohol. Tobacco:  reports that she has never smoked. She has never used smokeless tobacco. Medical:  has a past medical history of Anemia (2012), Back pain, Blood clot in vein, BP (high blood pressure) (03/08/2014), CAD (coronary artery disease) (10/18/2011), Chronic diastolic heart failure ([place]) (09/07/2014), Collagen vascular disease ([place]), Depression, DVT (deep venous thrombosis) ([place]) (05/14/2012), Essential thrombocytosis ([place]), Hyperlipidemia, 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[place]) (03/05/2015), Hypotension (03/26/2013), IBS (irritable bowel syndrome), Insomnia, Lupus anticoagulant positive, Macular degeneration, MS (multiple sclerosis) ([place]), Multiple sclerosis ([place]) (04/29/2008), NEUROPATHY (04/29/2008), RLS (restless legs syndrome), Spinal stenosis, and Urinary retention. Surgical: Ms. Starkel  has a past surgical history that includes Appendectomy; Cholecystectomy; Lumbar laminectomy; Cholecystectomy; Tonsillectomy; Lumbar disc surgery; and Anterior lumbar fusion (2012). Family: family history includes Alcohol abuse in her father; Diabetes in her brother and sister; Heart disease in her father and mother; Multiple sclerosis in her cousin and paternal aunt; Ovarian cancer in her mother; Thyroid cancer in her mother.  Constitutional Exam  General appearance: Well nourished, well developed, and well hydrated. In no apparent acute distress Vitals:   09/17/18 1317  BP: 110/66  Pulse: 82  Resp: 16  Temp: 98.3 F  (36.8 C)  TempSrc: Oral  SpO2: 97%  Weight: 109 lb (49.4 kg)  Height: 5' (1.524 m)   BMI Assessment: Estimated body mass index is 21.29 kg/m as calculated from the following:   Height as of this encounter: 5' (1.524 m).   Weight as of this encounter: 109 lb (49.4 kg).  BMI interpretation table: BMI level Category Range association with higher incidence of chronic pain  <18 kg/m2 Underweight   18.5-24.9 kg/m2 Ideal body weight   25-29.9 kg/m2 Overweight Increased incidence by 20%  30-34.9 kg/m2 Obese (Class I) Increased incidence by 68%  35-39.9 kg/m2 Severe obesity (Class II) Increased incidence by 136%  >40 kg/m2 Extreme obesity (Class III) Increased incidence by 254%   Patient's current BMI Ideal Body weight  Body mass index is 21.29 kg/m. Ideal body weight: 45.5 kg (100 lb 4.9 oz) Adjusted ideal body weight: 47.1 kg (103 lb 12.6 oz)   BMI Readings from Last 4 Encounters:  09/17/18 21.29 kg/m  08/27/18 17.81 kg/m  05/17/18 18.19 kg/m  11/21/17 21.46 kg/m   Wt Readings from Last 4 Encounters:  09/17/18 109 lb (49.4 kg)  08/27/18 107 lb (48.5 kg)  05/17/18 106 lb (48.1 kg)  11/21/17 125 lb (56.7 kg)  Psych/Mental status: Alert, oriented x 3 (person, place, & time)       Eyes: PERLA Respiratory: No evidence of acute respiratory distress  Cervical Spine Area Exam  Skin & Axial Inspection: No masses, redness, edema, swelling, or associated skin lesions Alignment: Asymmetric Functional ROM: Minimal ROM      Stability: No instability detected Muscle Tone/Strength: Functionally intact. No obvious neuro-muscular anomalies detected. Sensory (Neurological): Unimpaired Palpation: No palpable anomalies              Upper Extremity (UE) Exam  Side: Right upper extremity  Side: Left upper extremity  Skin & Extremity Inspection: Skin color, temperature, and hair growth are WNL. No peripheral edema or cyanosis. No masses, redness, swelling, asymmetry, or associated skin  lesions. No contractures.  Skin & Extremity Inspection: Skin color, temperature, and hair growth are WNL. No peripheral edema or cyanosis. No masses, redness, swelling, asymmetry, or associated skin lesions. No contractures.  Functional ROM: Decreased ROM for all joints of upper extremity  Functional ROM: Decreased ROM for all joints of upper extremity  Muscle Tone/Strength: Functionally intact. No obvious neuro-muscular anomalies detected.  Muscle Tone/Strength: Functionally intact. No obvious neuro-muscular anomalies detected.  Sensory (Neurological): Unimpaired          Sensory (Neurological): Unimpaired          Palpation: No palpable anomalies              Palpation: No palpable anomalies              Provocative Test(s):  Phalen's test: deferred Tinel's test: deferred Apley's scratch test (touch opposite shoulder):  Action 1 (Across chest): deferred Action 2 (Overhead): deferred Action 3 (LB reach): deferred   Provocative Test(s):  Phalen's test: deferred Tinel's test: deferred Apley's scratch test (touch opposite shoulder):  Action 1 (Across chest): deferred Action 2 (Overhead): deferred Action 3 (LB reach): deferred    Thoracic Spine Area Exam  Skin & Axial Inspection: No masses, redness, or swelling Alignment: Asymmetric Functional ROM: Unrestricted ROM Stability: No instability detected Muscle Tone/Strength: Functionally intact. No obvious neuro-muscular anomalies detected. Sensory (Neurological): Unimpaired Muscle strength & Tone: No palpable anomalies  Lumbar Spine Area Exam  Skin & Axial Inspection: No masses, redness, or swelling Alignment: Asymmetric Functional ROM: Unrestricted ROM       Stability: No instability detected Muscle Tone/Strength: Functionally intact. No obvious neuro-muscular anomalies detected. Sensory (Neurological): Unimpaired Palpation: No palpable anomalies       Provocative Tests: Hyperextension/rotation test: deferred today       Lumbar  quadrant test (Kemp's test): deferred today       Lateral bending test: deferred today       Patrick's Maneuver: deferred today                   FABER test: deferred today                   S-I anterior distraction/compression test: deferred today         S-I lateral compression test: deferred today         S-I Thigh-thrust test: deferred today         S-I Gaenslen's test: deferred today          Gait & Posture Assessment  Ambulation: Patient ambulates using a walker Gait: Significantly limited. Dependent on assistive device to ambulate Posture: Antalgic   Lower Extremity Exam    Side: Right lower extremity  Side: Left lower extremity  Stability: No instability observed          Stability: No instability observed          Skin & Extremity Inspection: Skin color, temperature, and hair growth are WNL. No peripheral edema or cyanosis. No masses, redness, swelling, asymmetry, or associated skin lesions. No contractures.  Skin & Extremity Inspection: Skin color, temperature, and hair growth are WNL. No peripheral edema or cyanosis. No masses, redness, swelling, asymmetry, or associated skin lesions. No contractures.  Functional ROM: Decreased ROM for all joints of the lower  extremity          Functional ROM: Decreased ROM for knee joint          Muscle Tone/Strength: Functionally intact. No obvious neuro-muscular anomalies detected.  Muscle Tone/Strength: Functionally intact. No obvious neuro-muscular anomalies detected.  Sensory (Neurological): Unimpaired  Sensory (Neurological): Unimpaired  Palpation: No palpable anomalies  Palpation: No palpable anomalies   Assessment & Plan  Primary Diagnosis & Pertinent Problem List: The primary encounter diagnosis was Chronic pain syndrome. Diagnoses of Multiple sclerosis ([place]), Chronic neck pain (Primary Area of Pain) (Bilateral) (R>L), Chronic upper extremity pain (Secondary Area of Pain) (Bilateral) (R>L), Chronic low back pain (Tertiary Area of Pain)  (Bilateral) (L>R) w/ sciatica (Bilateral), Chronic knee pain (Fourth Area of Pain) (Bilateral) (L>R), Long term current use of anticoagulant therapy (Xarelto), DDD (degenerative disc disease), cervical, and Chronic lower extremity pain (Bilateral) (L>R) were also pertinent to this visit.  Visit Diagnosis: 1. Chronic pain syndrome   2. Multiple sclerosis ([place])   3. Chronic neck pain (Primary Area of Pain) (Bilateral) (R>L)   4. Chronic upper extremity pain (Secondary Area of Pain) (Bilateral) (R>L)   5. Chronic low back pain (Tertiary Area of Pain) (Bilateral) (L>R) w/ sciatica (Bilateral)   6. Chronic knee pain (Fourth Area of Pain) (Bilateral) (L>R)   7. Long term current use of anticoagulant therapy (Xarelto)   8. DDD (degenerative disc disease), cervical   9. Chronic lower extremity pain (Bilateral) (L>R)    Problems updated and reviewed during this visit: No problems updated.  Plan of Care  Pharmacotherapy (Medications Ordered): Meds ordered this encounter  Medications  . oxyCODONE (OXY IR/ROXICODONE) 5 MG immediate release tablet    Sig: Take 1 tablet (5 mg total) by mouth every 6 (six) hours as needed for severe pain.    Dispense:  120 tablet    Refill:  0    [place] STOP ACT - Not applicable. Fill one day early if pharmacy is closed on scheduled refill date. Must last 30 days. Do not fill until: 11/17/2018. To last until: 12/17/2018.  [place] oxyCODONE (OXY IR/ROXICODONE) 5 MG immediate release tablet    Sig: Take 1 tablet (5 mg total) by mouth every 6 (six) hours as needed for severe pain.    Dispense:  120 tablet    Refill:  0    [place] STOP ACT - Not applicable. Fill one day early if pharmacy is closed on scheduled refill date. Must last 30 days. Do not fill until: 10/18/2018. To last until: 11/17/2018  . oxyCODONE (OXY IR/ROXICODONE) 5 MG immediate release tablet    Sig: Take 1 tablet (5 mg total) by mouth every 6 (six) hours as needed for severe pain.    Dispense:  120 tablet    Refill:   0    [place] STOP ACT - Not applicable. Fill one day early if pharmacy is closed on scheduled refill date. Must last 30 days. Do not fill until: 09/18/2018. To last until: 10/18/2018   Procedure Orders     Cervical Epidural Injection Lab Orders  No laboratory test(s) ordered today   Imaging Orders  No imaging studies ordered today   Referral Orders  No referral(s) requested today   Pharmacological management options:  Opioid Analgesics: We'll take over management today. See above orders Membrane stabilizer: We have discussed the possibility of optimizing this mode of therapy, if tolerated Muscle relaxant: We have discussed the possibility of a trial NSAID: We have discussed the possibility of a trial  Other analgesic(s): To be determined at a later time   Interventional management options: Planned, scheduled, and/or pending:    NOTE: Stop XARELTO x 3 days, prior to procedures. Restart 6 hours after. Diagnostic right-sided CESI #1 under fluoroscopy, no sedation   Considering:   Diagnostic bilateral cervical epidural steroid injection  Diagnostic bilateral cervical facet nerve block  Diagnostic bilateral intra-articular knee injections    PRN Procedures:   None at this time   Provider-requested follow-up: Return for Procedure (no sedation): (R) CESI #1, (Blood-thinner Protocol).  Future Appointments  Date Time Provider [place]  11/13/2018  3:00 PM Sater, Nanine Means, MD [facility] None    Primary Care Physician: [name], MD Location: [facility] Note by: Gaspar Cola, MD Date: 09/17/2018; Time: 1:56 PM

## 2018-09-17 ENCOUNTER — Other Ambulatory Visit: Payer: Self-pay

## 2018-09-17 ENCOUNTER — Encounter: Payer: Self-pay | Admitting: Pain Medicine

## 2018-09-17 ENCOUNTER — Ambulatory Visit: Payer: Medicare HMO | Attending: Pain Medicine | Admitting: Pain Medicine

## 2018-09-17 VITALS — BP 110/66 | HR 82 | Temp 98.3°F | Resp 16 | Ht 60.0 in | Wt 109.0 lb

## 2018-09-17 DIAGNOSIS — M25512 Pain in left shoulder: Secondary | ICD-10-CM | POA: Insufficient documentation

## 2018-09-17 DIAGNOSIS — M79601 Pain in right arm: Secondary | ICD-10-CM | POA: Diagnosis not present

## 2018-09-17 DIAGNOSIS — I2510 Atherosclerotic heart disease of native coronary artery without angina pectoris: Secondary | ICD-10-CM | POA: Insufficient documentation

## 2018-09-17 DIAGNOSIS — M5441 Lumbago with sciatica, right side: Secondary | ICD-10-CM | POA: Diagnosis not present

## 2018-09-17 DIAGNOSIS — N319 Neuromuscular dysfunction of bladder, unspecified: Secondary | ICD-10-CM | POA: Insufficient documentation

## 2018-09-17 DIAGNOSIS — Z9049 Acquired absence of other specified parts of digestive tract: Secondary | ICD-10-CM | POA: Insufficient documentation

## 2018-09-17 DIAGNOSIS — Z7901 Long term (current) use of anticoagulants: Secondary | ICD-10-CM | POA: Diagnosis not present

## 2018-09-17 DIAGNOSIS — Z79891 Long term (current) use of opiate analgesic: Secondary | ICD-10-CM | POA: Diagnosis not present

## 2018-09-17 DIAGNOSIS — F329 Major depressive disorder, single episode, unspecified: Secondary | ICD-10-CM | POA: Insufficient documentation

## 2018-09-17 DIAGNOSIS — Z5181 Encounter for therapeutic drug level monitoring: Secondary | ICD-10-CM | POA: Diagnosis not present

## 2018-09-17 DIAGNOSIS — E7800 Pure hypercholesterolemia, unspecified: Secondary | ICD-10-CM | POA: Insufficient documentation

## 2018-09-17 DIAGNOSIS — M25562 Pain in left knee: Secondary | ICD-10-CM

## 2018-09-17 DIAGNOSIS — I110 Hypertensive heart disease with heart failure: Secondary | ICD-10-CM | POA: Insufficient documentation

## 2018-09-17 DIAGNOSIS — M79602 Pain in left arm: Secondary | ICD-10-CM

## 2018-09-17 DIAGNOSIS — M4802 Spinal stenosis, cervical region: Secondary | ICD-10-CM | POA: Insufficient documentation

## 2018-09-17 DIAGNOSIS — M5030 Other cervical disc degeneration, unspecified cervical region: Secondary | ICD-10-CM | POA: Diagnosis not present

## 2018-09-17 DIAGNOSIS — M542 Cervicalgia: Secondary | ICD-10-CM

## 2018-09-17 DIAGNOSIS — M5417 Radiculopathy, lumbosacral region: Secondary | ICD-10-CM | POA: Diagnosis not present

## 2018-09-17 DIAGNOSIS — E559 Vitamin D deficiency, unspecified: Secondary | ICD-10-CM | POA: Insufficient documentation

## 2018-09-17 DIAGNOSIS — M5442 Lumbago with sciatica, left side: Secondary | ICD-10-CM

## 2018-09-17 DIAGNOSIS — M25511 Pain in right shoulder: Secondary | ICD-10-CM | POA: Insufficient documentation

## 2018-09-17 DIAGNOSIS — G894 Chronic pain syndrome: Secondary | ICD-10-CM

## 2018-09-17 DIAGNOSIS — K589 Irritable bowel syndrome without diarrhea: Secondary | ICD-10-CM | POA: Insufficient documentation

## 2018-09-17 DIAGNOSIS — M79605 Pain in left leg: Secondary | ICD-10-CM

## 2018-09-17 DIAGNOSIS — M79604 Pain in right leg: Secondary | ICD-10-CM

## 2018-09-17 DIAGNOSIS — Z86718 Personal history of other venous thrombosis and embolism: Secondary | ICD-10-CM | POA: Insufficient documentation

## 2018-09-17 DIAGNOSIS — G629 Polyneuropathy, unspecified: Secondary | ICD-10-CM | POA: Insufficient documentation

## 2018-09-17 DIAGNOSIS — M1712 Unilateral primary osteoarthritis, left knee: Secondary | ICD-10-CM | POA: Insufficient documentation

## 2018-09-17 DIAGNOSIS — G2581 Restless legs syndrome: Secondary | ICD-10-CM | POA: Insufficient documentation

## 2018-09-17 DIAGNOSIS — I5032 Chronic diastolic (congestive) heart failure: Secondary | ICD-10-CM | POA: Insufficient documentation

## 2018-09-17 DIAGNOSIS — M25561 Pain in right knee: Secondary | ICD-10-CM

## 2018-09-17 DIAGNOSIS — Z79899 Other long term (current) drug therapy: Secondary | ICD-10-CM | POA: Insufficient documentation

## 2018-09-17 DIAGNOSIS — M47896 Other spondylosis, lumbar region: Secondary | ICD-10-CM | POA: Diagnosis not present

## 2018-09-17 DIAGNOSIS — G35 Multiple sclerosis: Secondary | ICD-10-CM | POA: Diagnosis not present

## 2018-09-17 DIAGNOSIS — Z8249 Family history of ischemic heart disease and other diseases of the circulatory system: Secondary | ICD-10-CM | POA: Insufficient documentation

## 2018-09-17 DIAGNOSIS — G4700 Insomnia, unspecified: Secondary | ICD-10-CM | POA: Insufficient documentation

## 2018-09-17 DIAGNOSIS — R339 Retention of urine, unspecified: Secondary | ICD-10-CM | POA: Insufficient documentation

## 2018-09-17 DIAGNOSIS — G8929 Other chronic pain: Secondary | ICD-10-CM

## 2018-09-17 NOTE — Progress Notes (Signed)
Safety precautions to be maintained throughout the outpatient stay will include: orient to surroundings, keep bed in low position, maintain call bell within reach at all times, provide assistance with transfer out of bed and ambulation.  

## 2018-09-17 NOTE — Patient Instructions (Addendum)
____________________________________________________________________________________________  Pain Scale  Introduction: The pain score used by this practice is the Verbal Numerical Rating Scale (VNRS-11). This is an 11-point scale. It is for adults and children 10 years or older. There are significant differences in how the pain score is reported, used, and applied. Forget everything you learned in the past and learn this scoring system.  General Information: The scale should reflect your current level of pain. Unless you are specifically asked for the level of your worst pain, or your average pain. If you are asked for one of these two, then it should be understood that it is over the past 24 hours.  Basic Activities of Daily Living (ADL): Personal hygiene, dressing, eating, transferring, and using restroom.  Instructions: Most patients tend to report their level of pain as a combination of two factors, their physical pain and their psychosocial pain. This last one is also known as "suffering" and it is reflection of how physical pain affects you socially and psychologically. From now on, report them separately. From this point on, when asked to report your pain level, report only your physical pain. Use the following table for reference.  Pain Clinic Pain Levels (0-5/10)  Pain Level Score  Description  No Pain 0   Mild pain 1 Nagging, annoying, but does not interfere with basic activities of daily living (ADL). Patients are able to eat, bathe, get dressed, toileting (being able to get on and off the toilet and perform personal hygiene functions), transfer (move in and out of bed or a chair without assistance), and maintain continence (able to control bladder and bowel functions). Blood pressure and heart rate are unaffected. A normal heart rate for a healthy adult ranges from 60 to 100 bpm (beats per minute).   Mild to moderate pain 2 Noticeable and distracting. Impossible to hide from other  people. More frequent flare-ups. Still possible to adapt and function close to normal. It can be very annoying and may have occasional stronger flare-ups. With discipline, patients may get used to it and adapt.   Moderate pain 3 Interferes significantly with activities of daily living (ADL). It becomes difficult to feed, bathe, get dressed, get on and off the toilet or to perform personal hygiene functions. Difficult to get in and out of bed or a chair without assistance. Very distracting. With effort, it can be ignored when deeply involved in activities.   Moderately severe pain 4 Impossible to ignore for more than a few minutes. With effort, patients may still be able to manage work or participate in some social activities. Very difficult to concentrate. Signs of autonomic nervous system discharge are evident: dilated pupils (mydriasis); mild sweating (diaphoresis); sleep interference. Heart rate becomes elevated (>115 bpm). Diastolic blood pressure (lower number) rises above 100 mmHg. Patients find relief in laying down and not moving.   Severe pain 5 Intense and extremely unpleasant. Associated with frowning face and frequent crying. Pain overwhelms the senses.  Ability to do any activity or maintain social relationships becomes significantly limited. Conversation becomes difficult. Pacing back and forth is common, as getting into a comfortable position is nearly impossible. Pain wakes you up from deep sleep. Physical signs will be obvious: pupillary dilation; increased sweating; goosebumps; brisk reflexes; cold, clammy hands and feet; nausea, vomiting or dry heaves; loss of appetite; significant sleep disturbance with inability to fall asleep or to remain asleep. When persistent, significant weight loss is observed due to the complete loss of appetite and sleep deprivation.  Blood   pressure and heart rate becomes significantly elevated. Caution: If elevated blood pressure triggers a pounding headache  associated with blurred vision, then the patient should immediately seek attention at an urgent or emergency care unit, as these may be signs of an impending stroke.    Emergency Department Pain Levels (6-10/10)  Emergency Room Pain 6 Severely limiting. Requires emergency care and should not be seen or managed at an outpatient pain management facility. Communication becomes difficult and requires great effort. Assistance to reach the emergency department may be required. Facial flushing and profuse sweating along with potentially dangerous increases in heart rate and blood pressure will be evident.   Distressing pain 7 Self-care is very difficult. Assistance is required to transport, or use restroom. Assistance to reach the emergency department will be required. Tasks requiring coordination, such as bathing and getting dressed become very difficult.   Disabling pain 8 Self-care is no longer possible. At this level, pain is disabling. The individual is unable to do even the most "basic" activities such as walking, eating, bathing, dressing, transferring to a bed, or toileting. Fine motor skills are lost. It is difficult to think clearly.   Incapacitating pain 9 Pain becomes incapacitating. Thought processing is no longer possible. Difficult to remember your own name. Control of movement and coordination are lost.   The worst pain imaginable 10 At this level, most patients pass out from pain. When this level is reached, collapse of the autonomic nervous system occurs, leading to a sudden drop in blood pressure and heart rate. This in turn results in a temporary and dramatic drop in blood flow to the brain, leading to a loss of consciousness. Fainting is one of the body's self defense mechanisms. Passing out puts the brain in a calmed state and causes it to shut down for a while, in order to begin the healing process.    Summary: 1. Refer to this scale when providing [place] with your pain level. 2. Be  accurate and careful when reporting your pain level. This will help with your care. 3. Over-reporting your pain level will lead to loss of credibility. 4. Even a level of 1/10 means that there is pain and will be treated at our facility. 5. High, inaccurate reporting will be documented as "Symptom Exaggeration", leading to loss of credibility and suspicions of possible secondary gains such as obtaining more narcotics, or wanting to appear disabled, for fraudulent reasons. 6. Only pain levels of 5 or below will be seen at our facility. 7. Pain levels of 6 and above will be sent to the Emergency Department and the appointment cancelled. ____________________________________________________________________________________________   ____________________________________________________________________________________________  Blood Thinners  Recommended Time Interval Before and After Neuraxial Block or Catheter Removal  Drug (Generic) Brand Name Time Before Time After Comments  Abciximab Reopro 15 days 2 hours   Alteplase Activase 10 days 10 days   Apixaban Eliquis 3 days 6 hours   Aspirin > 325 mg Goody Powders/Excedrin 11 days  (Usually not stopped)  Aspirin ? 81 mg  7 days  (Usually not stopped)  Cholesterol Medication Lipitor 4 days    Cilostazol Pletal 3 days 5 hours   Clopidogrel Plavix 7-10 days 2 hours   Dabigatran Pradaxa 5 days 6 hours   Delteparin Fragmin 24 hours 4 hours   Dipyridamole + ASA Aggrenox 11days 2 hours   Enoxaparin  Lovenox 24 hours 4 hours   Eptifibatide Integrillin 8 hours 2 hours   Fish oil  4 days  Fondaparinux  Arixtra 72 hours 12 hours   Garlic supplements  7 days    Ginkgo biloba  36 hours    Ginseng  24 hours    Heparin (IV)  4 hours 2 hours   Heparin ([place])  12 hours 2 hours   Hydroxychloroquine Plaquenil 11 days    LMW Heparin  24 hours    LMWH  24 hours    NSAIDs  3 days  (Usually not stopped)  Prasugrel Effient 7-10 days 6 hours   Reteplase  Retavase 10 days 10 days   Rivaroxaban Xarelto 3 days 6 hours   Streptokinase Streptase 10 days 10 days   Tenecteplase TNKase 10 days 10 days   Thrombolytics  10 days  10 days Avoid x 10 days after inj.  Ticagrelor Brilinta 5-7 days 6 hours   Ticlodipine Ticlid 10-14 days 2 hours   Tinzaparin Innohep 24 hours 4 hours   Tirofiban Aggrastat 8 hours 2 hours   Vitamin E  4 days    Warfarin Coumadin 5 days 2 hours   ____________________________________________________________________________________________  ____________________________________________________________________________________________  Preparing for your procedure (without sedation)  Instructions: . Oral Intake: Do not eat or drink anything for at least 3 hours prior to your procedure. . Transportation: Unless otherwise stated by your physician, you may drive yourself after the procedure. . Blood Pressure Medicine: Take your blood pressure medicine with a sip of water the morning of the procedure. . Blood thinners: Notify our staff if you are taking any blood thinners. Depending on which one you take, there will be specific instructions on how and when to stop it. . Diabetics on insulin: Notify the staff so that you can be scheduled 1st case in the morning. If your diabetes requires high dose insulin, take only  of your normal insulin dose the morning of the procedure and notify the staff that you have done so. . Preventing infections: Shower with an antibacterial soap the morning of your procedure.  . Build-up your immune system: Take 1000 mg of Vitamin C with every meal (3 times a day) the day prior to your procedure. Marland Kitchen Antibiotics: Inform the staff if you have a condition or reason that requires you to take antibiotics before dental procedures. . Pregnancy: If you are pregnant, call and cancel the procedure. . Sickness: If you have a cold, fever, or any active infections, call and cancel the procedure. . Arrival: You must be  in the facility at least 30 minutes prior to your scheduled procedure. . Children: Do not bring any children with you. . Dress appropriately: Bring dark clothing that you would not mind if they get stained. . Valuables: Do not bring any jewelry or valuables.  Procedure appointments are reserved for interventional treatments only. Marland Kitchen No Prescription Refills. . No medication changes will be discussed during procedure appointments. . No disability issues will be discussed.  Reasons to call and reschedule or cancel your procedure: (Following these recommendations will minimize the risk of a serious complication.) . Surgeries: Avoid having procedures within 2 weeks of any surgery. (Avoid for 2 weeks before or after any surgery). . Flu Shots: Avoid having procedures within 2 weeks of a flu shots or . (Avoid for 2 weeks before or after immunizations). . Barium: Avoid having a procedure within 7-10 days after having had a radiological study involving the use of radiological contrast. (Myelograms, Barium swallow or enema study). . Heart attacks: Avoid any elective procedures or surgeries for the initial 6 months after  a "Myocardial Infarction" (Heart Attack). . Blood thinners: It is imperative that you stop these medications before procedures. Let us know if you if you take any blood thinner.  . Infection: Avoid procedures during or within two weeks of an infection (including chest colds or gastrointestinal problems). Symptoms associated with infections include: Localized redness, fever, chills, night sweats or profuse sweating, burning sensation when voiding, cough, congestion, stuffiness, runny nose, sore throat, diarrhea, nausea, vomiting, cold or Flu symptoms, recent or current infections. It is specially important if the infection is over the area that we intend to treat. Marland Kitchen Heart and lung problems: Symptoms that may suggest an active cardiopulmonary problem include: cough, chest pain, breathing  difficulties or shortness of breath, dizziness, ankle swelling, uncontrolled high or unusually low blood pressure, and/or palpitations. If you are experiencing any of these symptoms, cancel your procedure and contact your primary care physician for an evaluation.  Remember:  Regular Business hours are:  Monday to Thursday 8:00 AM to 4:00 PM  Provider's Schedule: Milinda Pointer, MD:  Procedure days: Tuesday and Thursday 7:30 AM to 4:00 PM  Gillis Santa, MD:  Procedure days: Monday and Wednesday 7:30 AM to 4:00 PM ____________________________________________________________________________________________   ____________________________________________________________________________________________  General Risks and Possible Complications  Patient Responsibilities: It is important that you read this as it is part of your informed consent. It is our duty to inform you of the risks and possible complications associated with treatments offered to you. It is your responsibility as a patient to read this and to ask questions about anything that is not clear or that you believe was not covered in this document.  Patient's Rights: You have the right to refuse treatment. You also have the right to change your mind, even after initially having agreed to have the treatment done. However, under this last option, if you wait until the last second to change your mind, you may be charged for the materials used up to that point.  Introduction: Medicine is not an Chief Strategy Officer. Everything in Medicine, including the lack of treatment(s), carries the potential for danger, harm, or loss (which is by definition: Risk). In Medicine, a complication is a secondary problem, condition, or disease that can aggravate an already existing one. All treatments carry the risk of possible complications. The fact that a side effects or complications occurs, does not imply that the treatment was conducted incorrectly. It must  be clearly understood that these can happen even when everything is done following the highest safety standards.  No treatment: You can choose not to proceed with the proposed treatment alternative. The "PRO(s)" would include: avoiding the risk of complications associated with the therapy. The "CON(s)" would include: not getting any of the treatment benefits. These benefits fall under one of three categories: diagnostic; therapeutic; and/or palliative. Diagnostic benefits include: getting information which can ultimately lead to improvement of the disease or symptom(s). Therapeutic benefits are those associated with the successful treatment of the disease. Finally, palliative benefits are those related to the decrease of the primary symptoms, without necessarily curing the condition (example: decreasing the pain from a flare-up of a chronic condition, such as incurable terminal cancer).  General Risks and Complications: These are associated to most interventional treatments. They can occur alone, or in combination. They fall under one of the following six (6) categories: no benefit or worsening of symptoms; bleeding; infection; nerve damage; allergic reactions; and/or death. 1. No benefits or worsening of symptoms: In Medicine there are no guarantees, only  probabilities. No healthcare provider can ever guarantee that a medical treatment will work, they can only state the probability that it may. Furthermore, there is always the possibility that the condition may worsen, either directly, or indirectly, as a consequence of the treatment. 2. Bleeding: This is more common if the patient is taking a blood thinner, either prescription or over the counter (example: Goody Powders, Fish oil, Aspirin, Garlic, etc.), or if suffering a condition associated with impaired coagulation (example: Hemophilia, cirrhosis of the liver, low platelet counts, etc.). However, even if you do not have one on these, it can still happen.  If you have any of these conditions, or take one of these drugs, make sure to notify your treating physician. 3. Infection: This is more common in patients with a compromised immune system, either due to disease (example: diabetes, cancer, human immunodeficiency virus [HIV], etc.), or due to medications or treatments (example: therapies used to treat cancer and rheumatological diseases). However, even if you do not have one on these, it can still happen. If you have any of these conditions, or take one of these drugs, make sure to notify your treating physician. 4. Nerve Damage: This is more common when the treatment is an invasive one, but it can also happen with the use of medications, such as those used in the treatment of cancer. The damage can occur to small secondary nerves, or to large primary ones, such as those in the spinal cord and brain. This damage may be temporary or permanent and it may lead to impairments that can range from temporary numbness to permanent paralysis and/or brain death. 5. Allergic Reactions: Any time a substance or material comes in contact with our body, there is the possibility of an allergic reaction. These can range from a mild skin rash (contact dermatitis) to a severe systemic reaction (anaphylactic reaction), which can result in death. 6. Death: In general, any medical intervention can result in death, most of the time due to an unforeseen complication. ____________________________________________________________________________________________  ____________________________________________________________________________________________  Medication Rules  Applies to: All patients receiving prescriptions (written or electronic).  Pharmacy of record: Pharmacy where electronic prescriptions will be sent. If written prescriptions are taken to a different pharmacy, please inform the nursing staff. The pharmacy listed in the electronic medical record should be the one  where you would like electronic prescriptions to be sent.  Prescription refills: Only during scheduled appointments. Applies to both, written and electronic prescriptions.  NOTE: The following applies primarily to controlled substances (Opioid* Pain Medications).   Patient's responsibilities: 1. Pain Pills: Bring all pain pills to every appointment (except for procedure appointments). 2. Pill Bottles: Bring pills in original pharmacy bottle. Always bring newest bottle. Bring bottle, even if empty. 3. Medication refills: You are responsible for knowing and keeping track of what medications you need refilled. The day before your appointment, write a list of all prescriptions that need to be refilled. Bring that list to your appointment and give it to the admitting nurse. Prescriptions will be written only during appointments. If you forget a medication, it will not be "Called in", "Faxed", or "electronically sent". You will need to get another appointment to get these prescribed. 4. Prescription Accuracy: You are responsible for carefully inspecting your prescriptions before leaving our office. Have the discharge nurse carefully go over each prescription with you, before taking them home. Make sure that your name is accurately spelled, that your address is correct. Check the name and dose of your medication to  make sure it is accurate. Check the number of pills, and the written instructions to make sure they are clear and accurate. Make sure that you are given enough medication to last until your next medication refill appointment. 5. Taking Medication: Take medication as prescribed. Never take more pills than instructed. Never take medication more frequently than prescribed. Taking less pills or less frequently is permitted and encouraged, when it comes to controlled substances (written prescriptions).  6. Inform other Doctors: Always inform, all of your healthcare providers, of all the medications you  take. 7. Pain Medication from other Providers: You are not allowed to accept any additional pain medication from any other Doctor or Healthcare provider. There are two exceptions to this rule. (see below) In the event that you require additional pain medication, you are responsible for notifying us, as stated below. 8. Medication Agreement: You are responsible for carefully reading and following our Medication Agreement. This must be signed before receiving any prescriptions from our practice. Safely store a copy of your signed Agreement. Violations to the Agreement will result in no further prescriptions. (Additional copies of our Medication Agreement are available upon request.) 9. Laws, Rules, & Regulations: All patients are expected to follow all Federal and [employer], [employer], Rules, [employer]. Ignorance of the Laws does not constitute a valid excuse. The use of any illegal substances is prohibited. 10. Adopted CDC guidelines & recommendations: Target dosing levels will be at or below 60 MME/day. Use of benzodiazepines** is not recommended.  Exceptions: There are only two exceptions to the rule of not receiving pain medications from other Healthcare Providers. 1. Exception #1 (Emergencies): In the event of an emergency (i.e.: accident requiring emergency care), you are allowed to receive additional pain medication. However, you are responsible for: As soon as you are able, call our office [phone], at any time of the day or night, and leave a message stating your name, the date and nature of the emergency, and the name and dose of the medication prescribed. In the event that your call is answered by a member of our staff, make sure to document and save the date, time, and the name of the person that took your information.  2. Exception #2 (Planned Surgery): In the event that you are scheduled by another doctor or dentist to have any type of surgery or procedure, you are allowed (for a period  no longer than 30 days), to receive additional pain medication, for the acute post-op pain. However, in this case, you are responsible for picking up a copy of our "Post-op Pain Management for Surgeons" handout, and giving it to your surgeon or dentist. This document is available at our office, and does not require an appointment to obtain it. Simply go to our office during business hours (Monday-Thursday from 8:00 AM to 4:00 PM) (Friday 8:00 AM to 12:00 Noon) or if you have a scheduled appointment with [place], prior to your surgery, and ask for it by name. In addition, you will need to provide [place] with your name, name of your surgeon, type of surgery, and date of procedure or surgery.  *Opioid medications include: morphine, codeine, oxycodone, oxymorphone, hydrocodone, hydromorphone, meperidine, tramadol, tapentadol, buprenorphine, fentanyl, methadone. **Benzodiazepine medications include: diazepam (Valium), alprazolam (Xanax), clonazepam (Klonopine), lorazepam (Ativan), clorazepate (Tranxene), chlordiazepoxide (Librium), estazolam (Prosom), oxazepam (Serax), temazepam (Restoril), triazolam (Halcion) (Last updated: 01/25/2018) ____________________________________________________________________________________________   ____________________________________________________________________________________________  Medication Recommendations and Reminders  Applies to: All patients receiving prescriptions (written and/or electronic).  Medication Rules &  Regulations: These rules and regulations exist for your safety and that of others. They are not flexible and neither are we. Dismissing or ignoring them will be considered "non-compliance" with medication therapy, resulting in complete and irreversible termination of such therapy. (See document titled "Medication Rules" for more details.) In all conscience, because of safety reasons, we cannot continue providing a therapy where the patient does not follow  instructions.  Pharmacy of record:   Definition: This is the pharmacy where your electronic prescriptions will be sent.   We do not endorse any particular pharmacy.  You are not restricted in your choice of pharmacy.  The pharmacy listed in the electronic medical record should be the one where you want electronic prescriptions to be sent.  If you choose to change pharmacy, simply notify our nursing staff of your choice of new pharmacy.  Recommendations:  Keep all of your pain medications in a safe place, under lock and key, even if you live alone.   After you fill your prescription, take 1 week's worth of pills and put them away in a safe place. You should keep a separate, properly labeled bottle for this purpose. The remainder should be kept in the original bottle. Use this as your primary supply, until it runs out. Once it's gone, then you know that you have 1 week's worth of medicine, and it is time to come in for a prescription refill. If you do this correctly, it is unlikely that you will ever run out of medicine.  To make sure that the above recommendation works, it is very important that you make sure your medication refill appointments are scheduled at least 1 week before you run out of medicine. To do this in an effective manner, make sure that you do not leave the office without scheduling your next medication management appointment. Always ask the nursing staff to show you in your prescription , when your medication will be running out. Then arrange for the receptionist to get you a return appointment, at least 7 days before you run out of medicine. Do not wait until you have 1 or 2 pills left, to come in. This is very poor planning and does not take into consideration that we may need to cancel appointments due to bad weather, sickness, or emergencies affecting our staff.  "Partial Fill": If for any reason your pharmacy does not have enough pills/tablets to completely fill or refill  your prescription, do not allow for a "partial fill". You will need a separate prescription to fill the remaining amount, which we will not provide. If the reason for the partial fill is your insurance, you will need to talk to the pharmacist about payment alternatives for the remaining tablets, but again, do not accept a partial fill.  Prescription refills and/or changes in medication(s):   Prescription refills, and/or changes in dose or medication, will be conducted only during scheduled medication management appointments. (Applies to both, written and electronic prescriptions.)  No refills on procedure days. No medication will be changed or started on procedure days. No changes, adjustments, and/or refills will be conducted on a procedure day. Doing so will interfere with the diagnostic portion of the procedure.  No phone refills. No medications will be "called into the pharmacy".  No Fax refills.  No weekend refills.  No Holliday refills.  No after hours refills.  Remember:  Business hours are:  Monday to Thursday 8:00 AM to 4:00 PM Provider's Schedule: Dionisio David, NP - Appointments are:  Medication management: Monday to Thursday 8:00 AM to 4:00 PM Milinda Pointer, MD - Appointments are:  Medication management: Monday and Wednesday 8:00 AM to 4:00 PM Procedure day: Tuesday and Thursday 7:30 AM to 4:00 PM Gillis Santa, MD - Appointments are:  Medication management: Tuesday and Thursday 8:00 AM to 4:00 PM Procedure day: Monday and Wednesday 7:30 AM to 4:00 PM (Last update: 01/25/2018) ____________________________________________________________________________________________   GENERAL RISKS AND COMPLICATIONS  What are the risk, side effects and possible complications? Generally speaking, most procedures are safe.  However, with any procedure there are risks, side effects, and the possibility of complications.  The risks and complications are dependent upon the sites that  are lesioned, or the type of nerve block to be performed.  The closer the procedure is to the spine, the more serious the risks are.  Great care is taken when placing the radio frequency needles, block needles or lesioning probes, but sometimes complications can occur. 1. Infection: Any time there is an injection through the skin, there is a risk of infection.  This is why sterile conditions are used for these blocks.  There are four possible types of infection. 1. Localized skin infection. 2. Central Nervous System Infection-This can be in the form of Meningitis, which can be deadly. 3. Epidural Infections-This can be in the form of an epidural abscess, which can cause pressure inside of the spine, causing compression of the spinal cord with subsequent paralysis. This would require an emergency surgery to decompress, and there are no guarantees that the patient would recover from the paralysis. 4. Discitis-This is an infection of the intervertebral discs.  It occurs in about 1% of discography procedures.  It is difficult to treat and it may lead to surgery.        2. Pain: the needles have to go through skin and soft tissues, will cause soreness.       3. Damage to internal structures:  The nerves to be lesioned may be near blood vessels or    other nerves which can be potentially damaged.       4. Bleeding: Bleeding is more common if the patient is taking blood thinners such as  aspirin, Coumadin, Ticiid, Plavix, etc., or if he/she have some genetic predisposition  such as hemophilia. Bleeding into the spinal canal can cause compression of the spinal  cord with subsequent paralysis.  This would require an emergency surgery to  decompress and there are no guarantees that the patient would recover from the  paralysis.       5. Pneumothorax:  Puncturing of a lung is a possibility, every time a needle is introduced in  the area of the chest or upper back.  Pneumothorax refers to free air around the   collapsed lung(s), inside of the thoracic cavity (chest cavity).  Another two possible  complications related to a similar event would include: Hemothorax and Chylothorax.   These are variations of the Pneumothorax, where instead of air around the collapsed  lung(s), you may have blood or chyle, respectively.       6. Spinal headaches: They may occur with any procedures in the area of the spine.       7. Persistent CSF (Cerebro-Spinal Fluid) leakage: This is a rare problem, but may occur  with prolonged intrathecal or epidural catheters either due to the formation of a fistulous  track or a dural tear.       8. Nerve damage: By working so close to  the spinal cord, there is always a possibility of  nerve damage, which could be as serious as a permanent spinal cord injury with  paralysis.       9. Death:  Although rare, severe deadly allergic reactions known as "Anaphylactic  reaction" can occur to any of the medications used.      10. Worsening of the symptoms:  We can always make thing worse.  What are the chances of something like this happening? Chances of any of this occuring are extremely low.  By statistics, you have more of a chance of getting killed in a motor vehicle accident: while driving to the hospital than any of the above occurring .  Nevertheless, you should be aware that they are possibilities.  In general, it is similar to taking a shower.  Everybody knows that you can slip, hit your head and get killed.  Does that mean that you should not shower again?  Nevertheless always keep in mind that statistics do not mean anything if you happen to be on the wrong side of them.  Even if a procedure has a 1 (one) in a 1,000,000 (million) chance of going wrong, it you happen to be that one..Also, keep in mind that by statistics, you have more of a chance of having something go wrong when taking medications.  Who should not have this procedure? If you are on a blood thinning medication (e.g.  Coumadin, Plavix, see list of "Blood Thinners"), or if you have an active infection going on, you should not have the procedure.  If you are taking any blood thinners, please inform your physician.  How should I prepare for this procedure?  Do not eat or drink anything at least six hours prior to the procedure.  Bring a driver with you .  It cannot be a taxi.  Come accompanied by an adult that can drive you back, and that is strong enough to help you if your legs get weak or numb from the local anesthetic.  Take all of your medicines the morning of the procedure with just enough water to swallow them.  If you have diabetes, make sure that you are scheduled to have your procedure done first thing in the morning, whenever possible.  If you have diabetes, take only half of your insulin dose and notify our nurse that you have done so as soon as you arrive at the clinic.  If you are diabetic, but only take blood sugar pills (oral hypoglycemic), then do not take them on the morning of your procedure.  You may take them after you have had the procedure.  Do not take aspirin or any aspirin-containing medications, at least eleven (11) days prior to the procedure.  They may prolong bleeding.  Wear loose fitting clothing that may be easy to take off and that you would not mind if it got stained with Betadine or blood.  Do not wear any jewelry or perfume  Remove any nail coloring.  It will interfere with some of our monitoring equipment.  NOTE: Remember that this is not meant to be interpreted as a complete list of all possible complications.  Unforeseen problems may occur.  BLOOD THINNERS The following drugs contain aspirin or other products, which can cause increased bleeding during surgery and should not be taken for 2 weeks prior to and 1 week after surgery.  If you should need take something for relief of minor pain, you may take acetaminophen which is found in  Tylenol,m Datril, Anacin-3 and  Panadol. It is not blood thinner. The products listed below are.  Do not take any of the products listed below in addition to any listed on your instruction sheet.  A.P.C or A.P.C with Codeine Codeine Phosphate Capsules #3 Ibuprofen Ridaura  ABC compound Congesprin Imuran rimadil  Advil Cope Indocin Robaxisal  Alka-Seltzer Effervescent Pain Reliever and Antacid Coricidin or Coricidin-D  Indomethacin Rufen  Alka-Seltzer plus Cold Medicine Cosprin Ketoprofen S-A-C Tablets  Anacin Analgesic Tablets or Capsules Coumadin Korlgesic Salflex  Anacin Extra Strength Analgesic tablets or capsules CP-2 Tablets Lanoril Salicylate  Anaprox Cuprimine Capsules Levenox Salocol  Anexsia-D Dalteparin Magan Salsalate  Anodynos Darvon compound Magnesium Salicylate Sine-off  Ansaid Dasin Capsules Magsal Sodium Salicylate  Anturane Depen Capsules Marnal Soma  APF Arthritis pain formula Dewitt's Pills Measurin Stanback  Argesic Dia-Gesic Meclofenamic Sulfinpyrazone  Arthritis Bayer Timed Release Aspirin Diclofenac Meclomen Sulindac  Arthritis pain formula Anacin Dicumarol Medipren Supac  Analgesic (Safety coated) Arthralgen Diffunasal Mefanamic Suprofen  Arthritis Strength Bufferin Dihydrocodeine Mepro Compound Suprol  Arthropan liquid Dopirydamole Methcarbomol with Aspirin Synalgos  ASA tablets/Enseals Disalcid Micrainin Tagament  Ascriptin Doan's Midol Talwin  Ascriptin A/D Dolene Mobidin Tanderil  Ascriptin Extra Strength Dolobid Moblgesic Ticlid  Ascriptin with Codeine Doloprin or Doloprin with Codeine Momentum Tolectin  Asperbuf Duoprin Mono-gesic Trendar  Aspergum Duradyne Motrin or Motrin IB Triminicin  Aspirin plain, buffered or enteric coated Durasal Myochrisine Trigesic  Aspirin Suppositories Easprin Nalfon Trillsate  Aspirin with Codeine Ecotrin Regular or Extra Strength Naprosyn Uracel  Atromid-S Efficin Naproxen Ursinus  Auranofin Capsules Elmiron Neocylate Vanquish  Axotal Emagrin Norgesic  Verin  Azathioprine Empirin or Empirin with Codeine Normiflo Vitamin E  Azolid Emprazil Nuprin Voltaren  Bayer Aspirin plain, buffered or children's or timed BC Tablets or powders Encaprin Orgaran Warfarin Sodium  Buff-a-Comp Enoxaparin Orudis Zorpin  Buff-a-Comp with Codeine Equegesic Os-Cal-Gesic   Buffaprin Excedrin plain, buffered or Extra Strength Oxalid   Bufferin Arthritis Strength Feldene Oxphenbutazone   Bufferin plain or Extra Strength Feldene Capsules Oxycodone with Aspirin   Bufferin with Codeine Fenoprofen Fenoprofen Pabalate or Pabalate-SF   Buffets II Flogesic Panagesic   Buffinol plain or Extra Strength Florinal or Florinal with Codeine Panwarfarin   Buf-Tabs Flurbiprofen Penicillamine   Butalbital Compound Four-way cold tablets Penicillin   Butazolidin Fragmin Pepto-Bismol   Carbenicillin Geminisyn Percodan   Carna Arthritis Reliever Geopen Persantine   Carprofen Gold's salt Persistin   Chloramphenicol Goody's Phenylbutazone   Chloromycetin Haltrain Piroxlcam   Clmetidine heparin Plaquenil   Cllnoril Hyco-pap Ponstel   Clofibrate Hydroxy chloroquine Propoxyphen         Before stopping any of these medications, be sure to consult the physician who ordered them.  Some, such as Coumadin (Warfarin) are ordered to prevent or treat serious conditions such as "deep thrombosis", "pumonary embolisms", and other heart problems.  The amount of time that you may need off of the medication may also vary with the medication and the reason for which you were taking it.  If you are taking any of these medications, please make sure you notify your pain physician before you undergo any procedures.         GENERAL RISKS AND COMPLICATIONS  What are the risk, side effects and possible complications? Generally speaking, most procedures are safe.  However, with any procedure there are risks, side effects, and the possibility of complications.  The risks and complications are dependent  upon the sites that are lesioned, or the type of  nerve block to be performed.  The closer the procedure is to the spine, the more serious the risks are.  Great care is taken when placing the radio frequency needles, block needles or lesioning probes, but sometimes complications can occur. 2. Infection: Any time there is an injection through the skin, there is a risk of infection.  This is why sterile conditions are used for these blocks.  There are four possible types of infection. 1. Localized skin infection. 2. Central Nervous System Infection-This can be in the form of Meningitis, which can be deadly. 3. Epidural Infections-This can be in the form of an epidural abscess, which can cause pressure inside of the spine, causing compression of the spinal cord with subsequent paralysis. This would require an emergency surgery to decompress, and there are no guarantees that the patient would recover from the paralysis. 4. Discitis-This is an infection of the intervertebral discs.  It occurs in about 1% of discography procedures.  It is difficult to treat and it may lead to surgery.        2. Pain: the needles have to go through skin and soft tissues, will cause soreness.       3. Damage to internal structures:  The nerves to be lesioned may be near blood vessels or    other nerves which can be potentially damaged.       4. Bleeding: Bleeding is more common if the patient is taking blood thinners such as  aspirin, Coumadin, Ticiid, Plavix, etc., or if he/she have some genetic predisposition  such as hemophilia. Bleeding into the spinal canal can cause compression of the spinal  cord with subsequent paralysis.  This would require an emergency surgery to  decompress and there are no guarantees that the patient would recover from the  paralysis.       5. Pneumothorax:  Puncturing of a lung is a possibility, every time a needle is introduced in  the area of the chest or upper back.  Pneumothorax refers to free air  around the  collapsed lung(s), inside of the thoracic cavity (chest cavity).  Another two possible  complications related to a similar event would include: Hemothorax and Chylothorax.   These are variations of the Pneumothorax, where instead of air around the collapsed  lung(s), you may have blood or chyle, respectively.       6. Spinal headaches: They may occur with any procedures in the area of the spine.       7. Persistent CSF (Cerebro-Spinal Fluid) leakage: This is a rare problem, but may occur  with prolonged intrathecal or epidural catheters either due to the formation of a fistulous  track or a dural tear.       8. Nerve damage: By working so close to the spinal cord, there is always a possibility of  nerve damage, which could be as serious as a permanent spinal cord injury with  paralysis.       9. Death:  Although rare, severe deadly allergic reactions known as "Anaphylactic  reaction" can occur to any of the medications used.      10. Worsening of the symptoms:  We can always make thing worse.  What are the chances of something like this happening? Chances of any of this occuring are extremely low.  By statistics, you have more of a chance of getting killed in a motor vehicle accident: while driving to the hospital than any of the above occurring .  Nevertheless, you should  be aware that they are possibilities.  In general, it is similar to taking a shower.  Everybody knows that you can slip, hit your head and get killed.  Does that mean that you should not shower again?  Nevertheless always keep in mind that statistics do not mean anything if you happen to be on the wrong side of them.  Even if a procedure has a 1 (one) in a 1,000,000 (million) chance of going wrong, it you happen to be that one..Also, keep in mind that by statistics, you have more of a chance of having something go wrong when taking medications.  Who should not have this procedure? If you are on a blood thinning medication  (e.g. Coumadin, Plavix, see list of "Blood Thinners"), or if you have an active infection going on, you should not have the procedure.  If you are taking any blood thinners, please inform your physician.  How should I prepare for this procedure?  Do not eat or drink anything at least six hours prior to the procedure.  Bring a driver with you .  It cannot be a taxi.  Come accompanied by an adult that can drive you back, and that is strong enough to help you if your legs get weak or numb from the local anesthetic.  Take all of your medicines the morning of the procedure with just enough water to swallow them.  If you have diabetes, make sure that you are scheduled to have your procedure done first thing in the morning, whenever possible.  If you have diabetes, take only half of your insulin dose and notify our nurse that you have done so as soon as you arrive at the clinic.  If you are diabetic, but only take blood sugar pills (oral hypoglycemic), then do not take them on the morning of your procedure.  You may take them after you have had the procedure.  Do not take aspirin or any aspirin-containing medications, at least eleven (11) days prior to the procedure.  They may prolong bleeding.  Wear loose fitting clothing that may be easy to take off and that you would not mind if it got stained with Betadine or blood.  Do not wear any jewelry or perfume  Remove any nail coloring.  It will interfere with some of our monitoring equipment.  NOTE: Remember that this is not meant to be interpreted as a complete list of all possible complications.  Unforeseen problems may occur.  BLOOD THINNERS The following drugs contain aspirin or other products, which can cause increased bleeding during surgery and should not be taken for 2 weeks prior to and 1 week after surgery.  If you should need take something for relief of minor pain, you may take acetaminophen which is found in Tylenol,m Datril, Anacin-3  and Panadol. It is not blood thinner. The products listed below are.  Do not take any of the products listed below in addition to any listed on your instruction sheet.  A.P.C or A.P.C with Codeine Codeine Phosphate Capsules #3 Ibuprofen Ridaura  ABC compound Congesprin Imuran rimadil  Advil Cope Indocin Robaxisal  Alka-Seltzer Effervescent Pain Reliever and Antacid Coricidin or Coricidin-D  Indomethacin Rufen  Alka-Seltzer plus Cold Medicine Cosprin Ketoprofen S-A-C Tablets  Anacin Analgesic Tablets or Capsules Coumadin Korlgesic Salflex  Anacin Extra Strength Analgesic tablets or capsules CP-2 Tablets Lanoril Salicylate  Anaprox Cuprimine Capsules Levenox Salocol  Anexsia-D Dalteparin Magan Salsalate  Anodynos Darvon compound Magnesium Salicylate Sine-off  Ansaid Dasin Capsules  Magsal Sodium Salicylate  Anturane Depen Capsules Marnal Soma  APF Arthritis pain formula Dewitt's Pills Measurin Stanback  Argesic Dia-Gesic Meclofenamic Sulfinpyrazone  Arthritis Bayer Timed Release Aspirin Diclofenac Meclomen Sulindac  Arthritis pain formula Anacin Dicumarol Medipren Supac  Analgesic (Safety coated) Arthralgen Diffunasal Mefanamic Suprofen  Arthritis Strength Bufferin Dihydrocodeine Mepro Compound Suprol  Arthropan liquid Dopirydamole Methcarbomol with Aspirin Synalgos  ASA tablets/Enseals Disalcid Micrainin Tagament  Ascriptin Doan's Midol Talwin  Ascriptin A/D Dolene Mobidin Tanderil  Ascriptin Extra Strength Dolobid Moblgesic Ticlid  Ascriptin with Codeine Doloprin or Doloprin with Codeine Momentum Tolectin  Asperbuf Duoprin Mono-gesic Trendar  Aspergum Duradyne Motrin or Motrin IB Triminicin  Aspirin plain, buffered or enteric coated Durasal Myochrisine Trigesic  Aspirin Suppositories Easprin Nalfon Trillsate  Aspirin with Codeine Ecotrin Regular or Extra Strength Naprosyn Uracel  Atromid-S Efficin Naproxen Ursinus  Auranofin Capsules Elmiron Neocylate Vanquish  Axotal Emagrin  Norgesic Verin  Azathioprine Empirin or Empirin with Codeine Normiflo Vitamin E  Azolid Emprazil Nuprin Voltaren  Bayer Aspirin plain, buffered or children's or timed BC Tablets or powders Encaprin Orgaran Warfarin Sodium  Buff-a-Comp Enoxaparin Orudis Zorpin  Buff-a-Comp with Codeine Equegesic Os-Cal-Gesic   Buffaprin Excedrin plain, buffered or Extra Strength Oxalid   Bufferin Arthritis Strength Feldene Oxphenbutazone   Bufferin plain or Extra Strength Feldene Capsules Oxycodone with Aspirin   Bufferin with Codeine Fenoprofen Fenoprofen Pabalate or Pabalate-SF   Buffets II Flogesic Panagesic   Buffinol plain or Extra Strength Florinal or Florinal with Codeine Panwarfarin   Buf-Tabs Flurbiprofen Penicillamine   Butalbital Compound Four-way cold tablets Penicillin   Butazolidin Fragmin Pepto-Bismol   Carbenicillin Geminisyn Percodan   Carna Arthritis Reliever Geopen Persantine   Carprofen Gold's salt Persistin   Chloramphenicol Goody's Phenylbutazone   Chloromycetin Haltrain Piroxlcam   Clmetidine heparin Plaquenil   Cllnoril Hyco-pap Ponstel   Clofibrate Hydroxy chloroquine Propoxyphen         Before stopping any of these medications, be sure to consult the physician who ordered them.  Some, such as Coumadin (Warfarin) are ordered to prevent or treat serious conditions such as "deep thrombosis", "pumonary embolisms", and other heart problems.  The amount of time that you may need off of the medication may also vary with the medication and the reason for which you were taking it.  If you are taking any of these medications, please make sure you notify your pain physician before you undergo any procedures.         Epidural Steroid Injection Patient Information  Description: The epidural space surrounds the nerves as they exit the spinal cord.  In some patients, the nerves can be compressed and inflamed by a bulging disc or a tight spinal canal (spinal stenosis).  By injecting  steroids into the epidural space, we can bring irritated nerves into direct contact with a potentially helpful medication.  These steroids act directly on the irritated nerves and can reduce swelling and inflammation which often leads to decreased pain.  Epidural steroids may be injected anywhere along the spine and from the neck to the low back depending upon the location of your pain.   After numbing the skin with local anesthetic (like Novocaine), a small needle is passed into the epidural space slowly.  You may experience a sensation of pressure while this is being done.  The entire block usually last less than 10 minutes.  Conditions which may be treated by epidural steroids:   Low back and leg pain  Neck and arm pain  Spinal  stenosis  Post-laminectomy syndrome  Herpes zoster (shingles) pain  Pain from compression fractures  Preparation for the injection:  3. Do not eat any solid food or dairy products within 8 hours of your appointment.  4. You may drink clear liquids up to 3 hours before appointment.  Clear liquids include water, black coffee, juice or soda.  No milk or cream please. 5. You may take your regular medication, including pain medications, with a sip of water before your appointment  Diabetics should hold regular insulin (if taken separately) and take 1/2 normal NPH dos the morning of the procedure.  Carry some sugar containing items with you to your appointment. 6. A driver must accompany you and be prepared to drive you home after your procedure.  7. Bring all your current medications with your. 8. An IV may be inserted and sedation may be given at the discretion of the physician.   9. A blood pressure cuff, EKG and other monitors will often be applied during the procedure.  Some patients may need to have extra oxygen administered for a short period. 10. You will be asked to provide medical information, including your allergies, prior to the procedure.  We must know  immediately if you are taking blood thinners (like Coumadin/Warfarin)  Or if you are allergic to IV iodine contrast (dye). We must know if you could possible be pregnant.  Possible side-effects:  Bleeding from needle site  Infection (rare, may require surgery)  Nerve injury (rare)  Numbness & tingling (temporary)  Difficulty urinating (rare, temporary)  Spinal headache ( a headache worse with upright posture)  Light -headedness (temporary)  Pain at injection site (several days)  Decreased blood pressure (temporary)  Weakness in arm/leg (temporary)  Pressure sensation in back/neck (temporary)  Call if you experience:  Fever/chills associated with headache or increased back/neck pain.  Headache worsened by an upright position.  New onset weakness or numbness of an extremity below the injection site  Hives or difficulty breathing (go to the emergency room)  Inflammation or drainage at the infection site  Severe back/neck pain  Any new symptoms which are concerning to you  Please note:  Although the local anesthetic injected can often make your back or neck feel good for several hours after the injection, the pain will likely return.  It takes 3-7 days for steroids to work in the epidural space.  You may not notice any pain relief for at least that one week.  If effective, we will often do a series of three injections spaced 3-6 weeks apart to maximally decrease your pain.  After the initial series, we generally will wait several months before considering a repeat injection of the same type.  If you have any questions, please call [phone] [place] office will need to obtain clearance for the patient to stop her Xarelto for 3 days prior to her procedure. If we obtain the clearance to stop it we will contact you to schedule the procedure. Please remember the patient will need to be NPO for 3 hours prior to her procedure.

## 2018-09-18 MED ORDER — OXYCODONE HCL 5 MG PO TABS: 5.0000 mg | ORAL_TABLET | Freq: Four times a day (QID) | ORAL | 0 refills | Status: DC | PRN

## 2018-09-19 NOTE — Progress Notes (Signed)
Fax clearance sent 09-17-2018 to Dr. Ouida Sills.

## 2018-09-25 ENCOUNTER — Other Ambulatory Visit: Payer: Self-pay

## 2018-09-25 ENCOUNTER — Other Ambulatory Visit: Payer: Self-pay | Admitting: Pain Medicine

## 2018-09-25 ENCOUNTER — Telehealth: Payer: Self-pay

## 2018-09-25 DIAGNOSIS — Z86718 Personal history of other venous thrombosis and embolism: Secondary | ICD-10-CM

## 2018-09-25 DIAGNOSIS — I825Y2 Chronic embolism and thrombosis of unspecified deep veins of left proximal lower extremity: Secondary | ICD-10-CM

## 2018-09-25 DIAGNOSIS — Z7901 Long term (current) use of anticoagulants: Secondary | ICD-10-CM

## 2018-09-25 NOTE — Telephone Encounter (Signed)
I called the patient to schedule her cervical epidural and on the notes it said to stop xarelto x 3days. I told her this but then she asked about stopping her chemo pill. She said she didn't ask Dr. Dossie Arbour when she was here. Can someone call her regarding this? Her appt is 10/09/18

## 2018-09-25 NOTE — Telephone Encounter (Signed)
Spoke with patient and notified her that she needed to get labwork drawn before the procedure.  Patient states she didn't know when she could do it because she lives at [employer].  Informed patient that I would speak to Dr Dossie Arbour and call her back. Spoke with Dr Dossie Arbour and he states that she could come any day the week before scheduled procedure  to [facility] in our clinic to get labwork  Drawn. Attempted to call patient back on home phone and cell phone.  Patient did not answer either one after just talking to her at home 5 minutes ago.  Attempted several times to reach patient and got no answer.

## 2018-10-09 ENCOUNTER — Other Ambulatory Visit: Payer: Self-pay | Admitting: Pain Medicine

## 2018-10-09 ENCOUNTER — Ambulatory Visit: Payer: Medicare HMO | Attending: Pain Medicine | Admitting: Pain Medicine

## 2018-10-09 DIAGNOSIS — M542 Cervicalgia: Secondary | ICD-10-CM | POA: Insufficient documentation

## 2018-10-09 DIAGNOSIS — M47812 Spondylosis without myelopathy or radiculopathy, cervical region: Secondary | ICD-10-CM | POA: Insufficient documentation

## 2018-10-09 DIAGNOSIS — R937 Abnormal findings on diagnostic imaging of other parts of musculoskeletal system: Secondary | ICD-10-CM | POA: Insufficient documentation

## 2018-10-09 DIAGNOSIS — M4802 Spinal stenosis, cervical region: Secondary | ICD-10-CM | POA: Insufficient documentation

## 2018-10-09 NOTE — Progress Notes (Deleted)
NO SHOW

## 2018-10-09 NOTE — Progress Notes (Unsigned)
NO SHOW to today's appointment (10/09/18). The patient was last seen on 09/17/2018 at which time we scheduled her for her first interventional treatment, a diagnostic right-sided cervical epidural steroid injection #1. The patient did not keep her procedure appointment.

## 2018-10-30 ENCOUNTER — Ambulatory Visit: Payer: Medicare HMO | Admitting: Pain Medicine

## 2018-10-30 NOTE — Progress Notes (Deleted)
Patient's Name: Cynthia Price  MRN: [id number]  Referring Provider: [name], MD  DOB: [birth year]  PCP: [name], MD  DOS: 10/30/2018  Note by: Gaspar Cola, MD  Service setting: Ambulatory outpatient  Specialty: Interventional Pain Management  Patient type: Established  Location: [facility] (AMB) Pain Management Facility  Visit type: Interventional Procedure   Primary Reason for Visit: Interventional Pain Management Treatment. CC: No chief complaint on file.  Procedure:          Anesthesia, Analgesia, Anxiolysis:  Type: Diagnostic, Inter-Laminar, Cervical Epidural Steroid Injection  #1  Region: Posterior Cervico-thoracic Region Level: C7-T1 Laterality: Right-Sided Paramedial  Type: Local Anesthesia Indication(s): Analgesia         Route: Infiltration ([place]/IM) IV Access: Declined Sedation: Declined  Local Anesthetic: Lidocaine 1-2%  Position: Prone with head of the table was raised to facilitate breathing.   Indications: 1. DDD (degenerative disc disease), cervical   2. Cervical central spinal stenosis   3. Cervical foraminal stenosis   4. Cervicalgia   5. Chronic neck pain (Primary Area of Pain) (Bilateral) (R>L)   6. Chronic upper extremity pain (Secondary Area of Pain) (Bilateral) (R>L)   7. Degenerative cervical spinal stenosis   8. Multiple sclerosis ([place])   9. Long term current use of anticoagulant therapy (Xarelto)    Pain Score: Pre-procedure:  /10 Post-procedure:  /10  Pre-op Assessment:  Cynthia Price is a 72 y.o. (year old), female patient, seen today for interventional treatment. She  has a past surgical history that includes Appendectomy; Cholecystectomy; Lumbar laminectomy; Cholecystectomy; Tonsillectomy; Lumbar disc surgery; and Anterior lumbar fusion (2012). Cynthia Price has a current medication list which includes the following prescription(s): ampyra, baclofen, carbidopa-levodopa, diclofenac sodium, escitalopram, gabapentin, guaifenesin,  hydroxyurea, levetiracetam, metoclopramide, multivitamin, ondansetron, oxybutynin, oxycodone, oxycodone, oxycodone, ropinirole, tizanidine, and xarelto. Her primarily concern today is the No chief complaint on file.  Initial Vital Signs:  Pulse/HCG Rate:    Temp:   Resp:   BP:   SpO2:    BMI: Estimated body mass index is 21.29 kg/m as calculated from the following:   Height as of 09/17/18: 5' (1.524 m).   Weight as of 09/17/18: 109 lb (49.4 kg).  Risk Assessment: Allergies: Reviewed. She is allergic to atorvastatin.  Allergy Precautions: None required Coagulopathies: Reviewed. None identified.  Blood-thinner therapy: None at this time Active Infection(s): Reviewed. None identified. Cynthia Price is afebrile  Site Confirmation: Cynthia Price was asked to confirm the procedure and laterality before marking the site Procedure checklist: Completed Consent: Before the procedure and under the influence of no sedative(s), amnesic(s), or anxiolytics, the patient was informed of the treatment options, risks and possible complications. To fulfill our ethical and legal obligations, as recommended by the American Medical Association's Code of Ethics, I have informed the patient of my clinical impression; the nature and purpose of the treatment or procedure; the risks, benefits, and possible complications of the intervention; the alternatives, including doing nothing; the risk(s) and benefit(s) of the alternative treatment(s) or procedure(s); and the risk(s) and benefit(s) of doing nothing. The patient was provided information about the general risks and possible complications associated with the procedure. These may include, but are not limited to: failure to achieve desired goals, infection, bleeding, organ or nerve damage, allergic reactions, paralysis, and death. In addition, the patient was informed of those risks and complications associated to Spine-related procedures, such as failure to decrease pain;  infection (i.e.: Meningitis, epidural or intraspinal abscess); bleeding (i.e.: epidural hematoma, subarachnoid  hemorrhage, or any other type of intraspinal or peri-dural bleeding); organ or nerve damage (i.e.: Any type of peripheral nerve, nerve root, or spinal cord injury) with subsequent damage to sensory, motor, and/or autonomic systems, resulting in permanent pain, numbness, and/or weakness of one or several areas of the body; allergic reactions; (i.e.: anaphylactic reaction); and/or death. Furthermore, the patient was informed of those risks and complications associated with the medications. These include, but are not limited to: allergic reactions (i.e.: anaphylactic or anaphylactoid reaction(s)); adrenal axis suppression; blood sugar elevation that in diabetics may result in ketoacidosis or comma; water retention that in patients with history of congestive heart failure may result in shortness of breath, pulmonary edema, and decompensation with resultant heart failure; weight gain; swelling or edema; medication-induced neural toxicity; particulate matter embolism and blood vessel occlusion with resultant organ, and/or nervous system infarction; and/or aseptic necrosis of one or more joints. Finally, the patient was informed that Medicine is not an exact science; therefore, there is also the possibility of unforeseen or unpredictable risks and/or possible complications that may result in a catastrophic outcome. The patient indicated having understood very clearly. We have given the patient no guarantees and we have made no promises. Enough time was given to the patient to ask questions, all of which were answered to the patient's satisfaction. Cynthia Price has indicated that she wanted to continue with the procedure. Attestation: I, the ordering provider, attest that I have discussed with the patient the benefits, risks, side-effects, alternatives, likelihood of achieving goals, and potential problems during  recovery for the procedure that I have provided informed consent. Date  Time: {CHL [facility]-PAIN TIME CHOICES:21018001}  Pre-Procedure Preparation:  Monitoring: As per clinic protocol. Respiration, ETCO2, SpO2, BP, heart rate and rhythm monitor placed and checked for adequate function Safety Precautions: Patient was assessed for positional comfort and pressure points before starting the procedure. Time-out: I initiated and conducted the "Time-out" before starting the procedure, as per protocol. The patient was asked to participate by confirming the accuracy of the "Time Out" information. Verification of the correct person, site, and procedure were performed and confirmed by me, the nursing staff, and the patient. "Time-out" conducted as per Joint Commission's Universal Protocol (UP.01.01.01). Time:    Description of Procedure:          Target Area: For Epidural Steroid injections the target is the interlaminar space, initially targeting the lower border of the superior vertebral body lamina. Approach: Paramedial approach. Area Prepped: Entire PosteriorCervical Region Prepping solution: ChloraPrep (2% chlorhexidine gluconate and 70% isopropyl alcohol) Safety Precautions: Aspiration looking for blood return was conducted prior to all injections. At no point did we inject any substances, as a needle was being advanced. No attempts were made at seeking any paresthesias. Safe injection practices and needle disposal techniques used. Medications properly checked for expiration dates. SDV (single dose vial) medications used. Description of the Procedure: Protocol guidelines were followed. The procedure needle was introduced through the skin, ipsilateral to the reported pain, and advanced to the target area. Bone was contacted and the needle walked caudad, until the lamina was cleared. The epidural space was identified using "loss-of-resistance technique" with 2-3 ml of PF-NaCl (0.9% NSS), in a 5cc LOR glass  syringe. There were no vitals filed for this visit.  Start Time:   hrs. End Time:   hrs. Materials:  Needle(s) Type: Epidural needle Gauge: 17G Length: 3.5-in Medication(s): Please see orders for medications and dosing details.  Imaging Guidance (Spinal):  Type of Imaging Technique: Fluoroscopy Guidance (Spinal) Indication(s): Assistance in needle guidance and placement for procedures requiring needle placement in or near specific anatomical locations not easily accessible without such assistance. Exposure Time: Please see nurses notes. Contrast: Before injecting any contrast, we confirmed that the patient did not have an allergy to iodine, shellfish, or radiological contrast. Once satisfactory needle placement was completed at the desired level, radiological contrast was injected. Contrast injected under live fluoroscopy. No contrast complications. See chart for type and volume of contrast used. Fluoroscopic Guidance: I was personally present during the use of fluoroscopy. "Tunnel Vision Technique" used to obtain the best possible view of the target area. Parallax error corrected before commencing the procedure. "Direction-depth-direction" technique used to introduce the needle under continuous pulsed fluoroscopy. Once target was reached, antero-posterior, oblique, and lateral fluoroscopic projection used confirm needle placement in all planes. Images permanently stored in EMR. Interpretation: I personally interpreted the imaging intraoperatively. Adequate needle placement confirmed in multiple planes. Appropriate spread of contrast into desired area was observed. No evidence of afferent or efferent intravascular uptake. No intrathecal or subarachnoid spread observed. Permanent images saved into the patient's record.  Antibiotic Prophylaxis:   Anti-infectives (From admission, onward)   None     Indication(s): None identified  Post-operative Assessment:  Post-procedure Vital Signs:   Pulse/HCG Rate:    Temp:   Resp:   BP:   SpO2:    EBL: None  Complications: No immediate post-treatment complications observed by team, or reported by patient.  Note: The patient tolerated the entire procedure well. A repeat set of vitals were taken after the procedure and the patient was kept under observation following institutional policy, for this type of procedure. Post-procedural neurological assessment was performed, showing return to baseline, prior to discharge. The patient was provided with post-procedure discharge instructions, including a section on how to identify potential problems. Should any problems arise concerning this procedure, the patient was given instructions to immediately contact us, at any time, without hesitation. In any case, we plan to contact the patient by telephone for a follow-up status report regarding this interventional procedure.  Comments:  No additional relevant information.  Plan of Care   Imaging Orders  No imaging studies ordered today   Procedure Orders    No procedure(s) ordered today    Medications ordered for procedure: No orders of the defined types were placed in this encounter.  Medications administered: Fawnda Vitullo. [place] had no medications administered during this visit.  See the medical record for exact dosing, route, and time of administration.  Disposition: Discharge home  Discharge Date & Time: 10/30/2018;   hrs.   Physician-requested Follow-up: No follow-ups on file.  Future Appointments  Date Time Provider [place]  10/30/2018  1:30 PM Milinda Pointer, MD [facility] None  11/13/2018  3:00 PM Sater, Nanine Means, MD [facility] None   Primary Care Physician: [name], MD Location: [facility] Note by: Gaspar Cola, MD Date: 10/30/2018; Time: 6:54 AM  Disclaimer:  Medicine is not an Chief Strategy Officer. The only guarantee in medicine is that nothing is guaranteed. It is  important to note that the decision to proceed with this intervention was based on the information collected from the patient. The Data and conclusions were drawn from the patient's questionnaire, the interview, and the physical examination. Because the information was provided in large part by the patient, it cannot be guaranteed that it has not been purposely or unconsciously manipulated. Every effort  has been made to obtain as much relevant data as possible for this evaluation. It is important to note that the conclusions that lead to this procedure are derived in large part from the available data. Always take into account that the treatment will also be dependent on availability of resources and existing treatment guidelines, considered by other Pain Management Practitioners as being common knowledge and practice, at the time of the intervention. For Medico-Legal purposes, it is also important to point out that variation in procedural techniques and pharmacological choices are the acceptable norm. The indications, contraindications, technique, and results of the above procedure should only be interpreted and judged by a Board-Certified Interventional Pain Specialist with extensive familiarity and expertise in the same exact procedure and technique.

## 2018-11-12 ENCOUNTER — Telehealth: Payer: Self-pay | Admitting: *Deleted

## 2018-11-12 ENCOUNTER — Telehealth: Payer: Self-pay | Admitting: Pain Medicine

## 2018-11-12 NOTE — Telephone Encounter (Signed)
Spoke with [name] and states that the head nurse needs an order to stop taking Xarelto prior to procedure on 12/06/18.  There was information placed into her chart, AVS, at her last visit reminding her of this detail.  Patient had to cancel procedure most recently because they didn't stop her Xarelto.  Called and spoke with Vaughan Basta, head nurse at [facility] and gave information to her about new appt date and time as well as information  Of stopping Xarelto 3 days prior to procedure and beginning back 6 hours post procedure.  Nurse verbalizes u/o information given.

## 2018-11-12 NOTE — Telephone Encounter (Signed)
Pts friend called and cancelled procedure appt for tomorrow bc the nurse at [facility] has still been giving the pt xarelto when its supposed to be stopped for 5 days before the procedure. She said this is the second time this has happened and that the nurses at [facility] said the dr would have to  Call and tell them to stop giving her the medication before they could. Pt is rescheduled for 12/06/18

## 2018-11-13 ENCOUNTER — Ambulatory Visit (INDEPENDENT_AMBULATORY_CARE_PROVIDER_SITE_OTHER): Payer: Medicare Other | Admitting: Neurology

## 2018-11-13 ENCOUNTER — Other Ambulatory Visit: Payer: Self-pay

## 2018-11-13 ENCOUNTER — Encounter: Payer: Self-pay | Admitting: Neurology

## 2018-11-13 ENCOUNTER — Ambulatory Visit: Payer: Medicare HMO | Admitting: Pain Medicine

## 2018-11-13 VITALS — BP 112/70 | HR 72 | Resp 20 | Ht 60.0 in | Wt 109.0 lb

## 2018-11-13 DIAGNOSIS — M62838 Other muscle spasm: Secondary | ICD-10-CM | POA: Diagnosis not present

## 2018-11-13 DIAGNOSIS — G801 Spastic diplegic cerebral palsy: Secondary | ICD-10-CM

## 2018-11-13 DIAGNOSIS — M542 Cervicalgia: Secondary | ICD-10-CM | POA: Diagnosis not present

## 2018-11-13 DIAGNOSIS — G35 Multiple sclerosis: Secondary | ICD-10-CM

## 2018-11-13 DIAGNOSIS — R262 Difficulty in walking, not elsewhere classified: Secondary | ICD-10-CM

## 2018-11-13 DIAGNOSIS — Z789 Other specified health status: Secondary | ICD-10-CM

## 2018-11-13 DIAGNOSIS — N319 Neuromuscular dysfunction of bladder, unspecified: Secondary | ICD-10-CM

## 2018-11-13 NOTE — Progress Notes (Signed)
[facility]  PATIENT: Cynthia Price DOB: [birth year]  REFERRING DOCTOR OR PCP:  Frazier Richards SOURCE: patient, notes from [facility] Neurology ([place] [place], [place] and [place], Imaging and lab reports, MRI images on CD PACS.     _________________________________   HISTORICAL  CHIEF COMPLAINT:  Chief Complaint  Patient presents with  . Multiple Sclerosis    Rm. 13. Sts. restarting Baclofen helped spasticity in legs. She is a resident at [facility]. she can tell when they don't give her the Baclofen on time.  She never had Botox for spasticity.  Our office made multiple attempts to reach her to schedule Botox./fim    HISTORY OF PRESENT ILLNESS:  Cynthia Price is a 72 yo woman with progressive MS.  Update 11/13/2018: She reports that her spasticity is a little better on the oral baclofen 10 mg p.o. 3 times daily.  She tolerates it well.  We discussed going up a little bit on the dose.  She still has severe spasticity in the legs, left greater than right.  At the last visit we discussed Botox or Xeomin injections for the spasticity.  She would like to proceed.  I also had a long discussion with her about rehab services and living arrangements.  She is currently in a high level assisted living or a skilled nursing facility she was receiving some physical therapy but is not receiving it now.    Attendents do not always help her in time to use the bathroom.  She also has neck pain radiating down the arms to the fingers (uncear which fingers involved more).   Currently more to the left side but more to the right some days.     She is set up for an ESI.  In her current wheelchair, her neck is flexed most of the day due to poor ability to keep her head upright.  From 05/17/2018: She is a 72 year old woman who was diagnose with MS about 35 years ago.   She initially had tingling in her left leg.   As she also had a hematologic issue, she was not sent to a neurologist  initially.   Then one day, she had weakness in her left arm for one day that was better the next day.   She also had intermittent diplopia.     She did well for many years and 15 years ago had no limp and was able to climb a ladder.   Over the next 5-10 years, she developed a mild limp but could still climb stairs.   She had further progressive gait issues mostly due to left leg weakness and spasticity over the next few years.    In 2012, she was able to walk a block without a cane.   he has had multiple operations in her lower back and has PLIF at [facility].    After her last lumbar operation in 2013, she had a lot more difficulty with her gait.   She had fallen before the surgery. After her surgery, gait was worse and she needed to use a walker.     She went from the walker to a chair about a year ago due to worsening left > right leg weakness and spasticity.     She has left arm weakness on her left starting around 2013.      She also has a left rotator cuff tear.   She can transfer by herself to an even level but not up from the  bed or to the bathroom (lift is needed).      In the past, she was on baclofen but it was stopped due to theoretical concern about sedation as also on opiate.    She is on tizanidine only at bedtime (4 mg).    Her legs draw up more at night from spasticity, left > right.     She also reports needing a catheter due to urinary incontinence (lives in SNF and they recommended the catheter).    She has reduce division out of her left eye and colors are desaturated out of her left eye.      She has trouble with sleep maintenance insomnia (occasioanlly sleep onset).   She wakes up after a couple hours and then has trouble falling back asleep.    She spends most of the time in bed aso is no longer fatigued.  PT was working with her to try to get back to a walker but now is only trying to help with transfers.     She feels more depressed over the past couple years.    She feels her cognition  is doing well.   She is on Xarelto due to a DVT after a sternal fracture about 6 months ago.   Reportedly, her repeat U/S was normal.        The following MRI's were personally reviewed: MRI of the lumbar spine 01/19/2017 shows L3-L4 fusion, mild spinal stenosis at L2-L3, left hemilaminectomy at L4-L5 with mild spinal stenosis and bilateral foraminal and lateral recess stenosis, moderately severe left foraminal narrowing at L5-S1.  MRI of the brain 05/26/2016 personally compared to the MRI of the brain 06/19/2007.  There are multiple periventricular, juxtacortical and deep white matter foci in the brain, there are also a focus in the left thalamus and in the left middle cerebellar peduncle.  There has been mild to moderate progression between 2008 and 2017.  MRI of the cervical spine 03/10/2011.  There is a large focus adjacent to C4 within the spinal cord and a smaller focus adjacent to C7-T1.  Mild degenerative changes are noted.  She has kyphosis.  Multiple records from [facility] neurology were reviewed and part of the summary above.  REVIEW OF SYSTEMS: Constitutional: No fevers, chills, sweats, or change in appetite.   She has insomnia Eyes: No visual changes, double vision, eye pain Ear, nose and throat: No hearing loss, ear pain, nasal congestion, sore throat Cardiovascular: No chest pain, palpitations Respiratory: No shortness of breath at rest or with exertion.   No wheezes GastrointestinaI: No nausea, vomiting, diarrhea, abdominal pain, fecal incontinence Genitourinary:Has indwelling catheter. Musculoskeletal: No neck pain, back pain Integumentary: No rash, pruritus, skin lesions Neurological: as above Psychiatric: Notes depression and mild anxiety Endocrine: No palpitations, diaphoresis, change in appetite, change in weigh or increased thirst Hematologic/Lymphatic: No anemia, purpura, petechiae. Allergic/Immunologic: No itchy/runny eyes, nasal congestion, recent allergic reactions,  rashes  ALLERGIES: Allergies  Allergen Reactions  . Atorvastatin     Muscle aches at >40mg      HOME MEDICATIONS:  Current Outpatient Medications:  .  AMPYRA 10 MG TB12, Take 10 mg by mouth 2 (two) times daily. , Disp: , Rfl:  .  baclofen (LIORESAL) 10 MG tablet, Take 1 tablet (10 mg total) by mouth 3 (three) times daily., Disp: 90 each, Rfl: 11 .  carbidopa-levodopa (SINEMET IR) 25-100 MG tablet, Take by mouth., Disp: , Rfl:  .  diclofenac sodium (VOLTAREN) 1 % GEL, Apply topically 4 (four)  times daily., Disp: , Rfl:  .  escitalopram (LEXAPRO) 5 MG tablet, , Disp: , Rfl:  .  gabapentin (NEURONTIN) 800 MG tablet, Take 800 mg by mouth 4 (four) times daily. , Disp: , Rfl:  .  guaifenesin (ROBITUSSIN) 100 MG/5ML syrup, Take 200 mg by mouth 3 (three) times daily as needed for cough., Disp: , Rfl:  .  hydroxyurea (HYDREA) 500 MG capsule, Take one by mouth Monday, Tuesday, Wed, Thursday, Friday, Disp: 90 capsule, Rfl: 3 .  levETIRAcetam (KEPPRA) 250 MG tablet, , Disp: , Rfl:  .  metoCLOPramide (REGLAN) 5 MG tablet, Take by mouth., Disp: , Rfl:  .  Multiple Vitamin (MULTIVITAMIN) tablet, Take 1 tablet by mouth daily., Disp: , Rfl:  .  ondansetron (ZOFRAN) 4 MG tablet, , Disp: , Rfl:  .  [START ON 11/17/2018] oxyCODONE (OXY IR/ROXICODONE) 5 MG immediate release tablet, Take 1 tablet (5 mg total) by mouth every 6 (six) hours as needed for severe pain., Disp: 120 tablet, Rfl: 0 .  rOPINIRole (REQUIP) 2 MG tablet, Take 2 mg by mouth 4 (four) times daily., Disp: , Rfl:  .  tiZANidine (ZANAFLEX) 4 MG tablet, , Disp: , Rfl:  .  XARELTO 20 MG TABS tablet, Take 20 mg by mouth daily with breakfast. , Disp: , Rfl:  .  oxybutynin (DITROPAN) 5 MG tablet, , Disp: , Rfl:  .  oxyCODONE (OXY IR/ROXICODONE) 5 MG immediate release tablet, Take 1 tablet (5 mg total) by mouth every 6 (six) hours as needed for severe pain., Disp: 120 tablet, Rfl: 0  PAST MEDICAL HISTORY: Past Medical History:  Diagnosis Date  .  Anemia 2012   from labs at [facility]  . Back pain    s/p hemilaminectomy with h/o herniated disc at L4L5  . Blood clot in vein    behind left knee  . BP (high blood pressure) 03/08/2014   Last Assessment & Plan:  Blood pressure has been controlled without significant dizzyness or associated fatigue. Taking antihypertensives as directed without difficulty.    Marland Kitchen CAD (coronary artery disease) 10/18/2011  . Chronic diastolic heart failure ([place]) 09/07/2014   Last Assessment & Plan:  Edema and sob seemingly baseline   . Collagen vascular disease ([place])   . Depression    after husband died  . DVT (deep venous thrombosis) ([place]) 05/14/2012  . Essential thrombocytosis ([place])   . Hyperlipidemia   . 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[place]) 03/05/2015  . Hypotension 03/26/2013  . IBS (irritable bowel syndrome)   . Insomnia   . Lupus anticoagulant positive   . Macular degeneration   . MS (multiple sclerosis) ([place])   . Multiple sclerosis ([place]) 04/29/2008   Per neurology at [facility]    . NEUROPATHY 04/29/2008   Per Neurology at [facility]    . RLS (restless legs syndrome)   . Spinal stenosis    lumbar  . Urinary retention    with high post void residuals 2013, per [facility] uro    PAST SURGICAL HISTORY: Past Surgical History:  Procedure Laterality Date  . ANTERIOR LUMBAR FUSION  2012  . APPENDECTOMY    . CHOLECYSTECTOMY    . CHOLECYSTECTOMY    . LUMBAR DISC SURGERY    . LUMBAR LAMINECTOMY    . TONSILLECTOMY      FAMILY HISTORY: Family History  Problem Relation Age of Onset  . Heart disease Mother   . Thyroid cancer Mother   . Ovarian cancer Mother   . Alcohol abuse Father   .  Heart disease Father   . Diabetes Sister   . Diabetes Brother   . Multiple sclerosis Paternal Aunt   . Multiple sclerosis Cousin     SOCIAL HISTORY:  Social History   Socioeconomic History  . Marital status: Widowed    Spouse name: Not on file  . Number of children: 2  . Years of education: Not on file  . Highest  education level: Not on file  Occupational History    Employer: Retired  Scientific laboratory technician  . Financial resource strain: Not on file  . Food insecurity:    Worry: Not on file    Inability: Not on file  . Transportation needs:    Medical: Not on file    Non-medical: Not on file  Tobacco Use  . Smoking status: Never Smoker  . Smokeless tobacco: Never Used  Substance and Sexual Activity  . Alcohol use: No  . Drug use: No  . Sexual activity: Not on file  Lifestyle  . Physical activity:    Days per week: Not on file    Minutes per session: Not on file  . Stress: Not on file  Relationships  . Social connections:    Talks on phone: Not on file    Gets together: Not on file    Attends religious service: Not on file    Active member of club or organization: Not on file    Attends meetings of clubs or organizations: Not on file    Relationship status: Not on file  . Intimate partner violence:    Fear of current or ex partner: Not on file    Emotionally abused: Not on file    Physically abused: Not on file    Forced sexual activity: Not on file  Other Topics Concern  . Not on file  Social History Narrative  . Not on file     PHYSICAL EXAM  Vitals:   11/13/18 1358  BP: 112/70  Pulse: 72  Resp: 20  Weight: 109 lb (49.4 kg)  Height: 5' (1.524 m)    Body mass index is 21.29 kg/m.   General: The patient is well-developed and well-nourished woman in a wheelchair and in no acute distress  Eyes:  Funduscopic exam shows normal optic discs and retinal vessels.  Neck: The neck is supple, no carotid bruits are noted.  The neck is nontender.  Cardiovascular: The heart has a regular rate and rhythm with a normal S1 and S2. There were no murmurs, gallops or rubs. Lungs are clear to auscultation.  Skin: Extremities are without significant edema.  Musculoskeletal:  Back is nontender  Neurologic Exam  Mental status: The patient is alert and oriented x 3 at the time of the  examination. The patient has apparent normal recent and remote memory, with an apparently normal attention span and concentration ability.   Speech is normal.  Cranial nerves: Extraocular movements are full. Pupils are equal, round, and reactive to light and accomodation.  Visual fields are full.  Facial symmetry is present. There is good facial sensation to soft touch bilaterally.Facial strength is normal.  Trapezius and sternocleidomastoid strength is normal. No dysarthria is noted.  The tongue is midline, and the patient has symmetric elevation of the soft palate. No obvious hearing deficits are noted.  Motor: Muscle bulk was normal.  She had normal muscle tone in the right arm, mildly increased muscle tone in the left arm, moderately increased muscle tone in the right leg and severely increased muscle  tone with contractures of the hamstring in the left leg.   Strength was 5/5 in the right arm, 4 to 4+/5 in the left arm,  4-/5 in the right leg and 2-3/5 in the left leg  Sensory: Sensory testing was intact to the arm but she had reduced sensation to vibration and touch in the left leg.  Coordination: Cerebellar testing reveals good right finger-nose-finger reduced left finger-nose-finger and very reduced right heel-to-shin.  She could not do left heel-to-shin  Gait and station: She cannot stand or walk.  Reflexes: Deep tendon reflexes are increased, legs greater than arms and left greater than right.  There was clonus at the ankles and cross adductor responses, more intense on the left.  The plantar response was extensor on the left.    DIAGNOSTIC DATA (LABS, IMAGING, TESTING) - I reviewed patient records, labs, notes, testing and imaging myself where available.  Lab Results  Component Value Date   WBC 5.0 11/03/2017   HGB 12.1 11/03/2017   HCT 36.7 11/03/2017   MCV 96.1 11/03/2017   PLT 383 11/03/2017      Component Value Date/Time   NA 142 08/27/2018 1545   NA 143 11/02/2014 1914    K 4.4 08/27/2018 1545   K 3.8 11/02/2014 1914   CL 100 08/27/2018 1545   CL 107 11/02/2014 1914   CO2 28 11/03/2017 1432   CO2 31 11/02/2014 1914   GLUCOSE 114 (H) 08/27/2018 1545   GLUCOSE 102 (H) 11/03/2017 1432   GLUCOSE 97 11/02/2014 1914   BUN 22 08/27/2018 1545   BUN 14 11/02/2014 1914   CREATININE 0.86 08/27/2018 1545   CREATININE 1.01 11/02/2014 1914   CALCIUM 9.3 08/27/2018 1545   CALCIUM 8.7 11/02/2014 1914   PROT 6.8 08/27/2018 1545   PROT 6.8 11/02/2014 1914   ALBUMIN 4.4 08/27/2018 1545   ALBUMIN 3.4 11/02/2014 1914   AST 34 08/27/2018 1545   AST 29 11/02/2014 1914   ALT 15 11/03/2017 1432   ALT 26 11/02/2014 1914   ALKPHOS 88 08/27/2018 1545   ALKPHOS 81 11/02/2014 1914   BILITOT <0.2 08/27/2018 1545   BILITOT 0.3 11/02/2014 1914   GFRNONAA 68 08/27/2018 1545   GFRNONAA 58 (L) 11/02/2014 1914   GFRNONAA >60 06/27/2014 1844   GFRAA 78 08/27/2018 1545   GFRAA >60 11/02/2014 1914   GFRAA >60 06/27/2014 1844   Lab Results  Component Value Date   CHOL 162 08/20/2017   HDL 72 08/20/2017   LDLCALC 80 08/20/2017   LDLDIRECT 114.8 11/17/2011   TRIG 49 08/20/2017   CHOLHDL 2.3 08/20/2017   No results found for: HGBA1C Lab Results  Component Value Date   CXKGYJEH63 149 08/27/2018   Lab Results  Component Value Date   TSH 4.008 08/20/2017       ASSESSMENT AND PLAN  Multiple sclerosis ([place])  Cervicalgia  Muscle spasticity  Inability to walk  Resides in skilled nursing facility  Neuromuscular dysfunction of bladder  1.   Xeomin injections as follows: Left leg: 60 U  into the semimembranosus muscle 60 U  into the semitendinosus muscle 100 U into the short and long heads of the biceps femoris 40 U  into the abductor longus 40 units into the gastrocnemius  Right leg: 25 U  into the semimembranosus muscle 25 U  into the semitendinosus muscle 50 U into the short and long heads of the biceps femoris  2.   I ordered physical and occupational  therapy. 3.  Advised to be placed in a wheelchair with a reclining back as it would keep her head from being flexed all day. 4.   Return in 3 months or sooner if there are new or worsening neurologic symptoms  50-minute face-to-face evaluation with greater than one half the time counseling coordinating care about her progressive MS, severe spasticity, rehab issues.  Taiquan Campanaro A. Felecia Shelling, MD, PhD, FAAN Certified in Neurology, Clinical Neurophysiology, Sleep Medicine, Pain Medicine and Neuroimaging Director, [facility] [address] [place], [place] [address] [phone]

## 2018-12-03 ENCOUNTER — Telehealth: Payer: Self-pay | Admitting: Pain Medicine

## 2018-12-04 ENCOUNTER — Ambulatory Visit: Payer: Medicaid Other | Admitting: Pain Medicine

## 2018-12-06 ENCOUNTER — Ambulatory Visit: Payer: Self-pay | Admitting: Pain Medicine

## 2018-12-10 NOTE — Telephone Encounter (Signed)
error 

## 2018-12-13 ENCOUNTER — Non-Acute Institutional Stay: Payer: Medicare Other | Admitting: Nurse Practitioner

## 2018-12-13 ENCOUNTER — Encounter: Payer: Self-pay | Admitting: Nurse Practitioner

## 2018-12-13 VITALS — HR 82 | Resp 18 | Wt 106.6 lb

## 2018-12-13 DIAGNOSIS — R531 Weakness: Secondary | ICD-10-CM

## 2018-12-13 DIAGNOSIS — R0602 Shortness of breath: Secondary | ICD-10-CM | POA: Insufficient documentation

## 2018-12-13 DIAGNOSIS — Z515 Encounter for palliative care: Secondary | ICD-10-CM | POA: Insufficient documentation

## 2018-12-13 NOTE — Progress Notes (Signed)
[facility] Telephone: [phone] Fax: [phone]  PATIENT NAME: Cynthia Price DOB: 1946/05/16 MRN: [id number]  PRIMARY CARE PROVIDER:   [name], MD  REFERRING PROVIDER:  Dr Anderson/[facility] RESPONSIBLE PARTY:   Tashiba Timoney son [phone]  ASSESSMENT:     I visited and observe Cynthia Price. We talked about purpose for palliative care visit and she gave verbal consent. We talked about how she was feeling today. Cynthia Price endorses that she is doing okay except for her Foley has been bothering her. We talked about symptoms of pain and shortness of breath what she denies. We talked about her appetite. We talked about her functional-level. We talked about her attending social activities at the facility and visiting with friends. She talked about the holidays. She talked about her son and daughter at length. We talked about her residing at skilled [place] permanently. Cynthia Price endorses that she has adjusted. Cynthia Price does remaining DNR. Talked about role of palliative care and plan of care. Therapeutic listening and emotional support provided. I have attempted to call [name], her son with update on palliative care visit. I have updated nursing staff in the new changes to current goals or plan of care.   RECOMMENDATIONS and PLAN:   1.Palliative care encounter Z51.5; Palliative medicine team will continue to support patient, patient's family, and medical team. Visit consisted of counseling and education dealing with the complex and emotionally intense issues of symptom management and palliative care in the setting of serious and potentially life-threatening illness  2. Generalized weakness R53.1  secondary to CHF/MS continue with therapy as able. Encourage energy conservation and rest times.  3. Dyspneic R06.00 stable, secondary to congestive heart failure remain stable at present time. Continue daily weights and outpatient Cardiology  appointments  I spent 65 minutes providing this consultation,  from 10:45 to 11:50am. More than 50% of the time in this consultation was spent coordinating communication.   HISTORY OF PRESENT ILLNESS:  Cynthia Price is a 73 y.o. year old female with multiple medical problems including Coronary artery disease, chronic diastolic congestive heart failure, collagen vascular disease come up hypertension, pulmonary vascular disease, lupus, multiple sclerosis, arthritis, neuropathy, restless leg syndrome, spinal stenosis, macular degeneration, hyperlipidemia, thrombocytosis, anemia, history of chronic back pain s/p hemilaminectomy with history of herniated disc L4 L5, depression, vitamin D deficiency, cholecystectomy appendectomy. Ms. Gadison continues to reside in [place]. She does require assistance with transferring to a wheelchair where she does go and participate in activities. She is social at the facility. She does require assistance with ADLs. She does have a Foley. Staff endorses her appetite has been good with stble weight. No recent hospitalizations, wounds come infections. She is able to verbalize her name. At present she is lying in bed, appears comfortable. No visitors present. Palliative Care was asked to help address goals of care.   CODE STATUS: DNR  PPS: 40% HOSPICE ELIGIBILITY/DIAGNOSIS: TBD  PAST MEDICAL HISTORY:  Past Medical History:  Diagnosis Date  . Anemia 2012   from labs at [facility]  . Back pain    s/p hemilaminectomy with h/o herniated disc at L4L5  . Blood clot in vein    behind left knee  . BP (high blood pressure) 03/08/2014   Last Assessment & Plan:  Blood pressure has been controlled without significant dizzyness or associated fatigue. Taking antihypertensives as directed without difficulty.    Marland Kitchen CAD (coronary artery disease) 10/18/2011  . Chronic  diastolic heart failure ([place]) 09/07/2014   Last Assessment & Plan:  Edema and sob seemingly baseline   .  Collagen vascular disease ([place])   . Depression    after husband died  . DVT (deep venous thrombosis) ([place]) 05/14/2012  . Essential thrombocytosis ([place])   . Hyperlipidemia   . 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[place]) 03/05/2015  . Hypotension 03/26/2013  . IBS (irritable bowel syndrome)   . Insomnia   . Lupus anticoagulant positive   . Macular degeneration   . MS (multiple sclerosis) ([place])   . Multiple sclerosis ([place]) 04/29/2008   Per neurology at [facility]    . NEUROPATHY 04/29/2008   Per Neurology at [facility]    . RLS (restless legs syndrome)   . Spinal stenosis    lumbar  . Urinary retention    with high post void residuals 2013, per [facility] uro    SOCIAL HX:  Social History   Tobacco Use  . Smoking status: Never Smoker  . Smokeless tobacco: Never Used  Substance Use Topics  . Alcohol use: No    ALLERGIES:  Allergies  Allergen Reactions  . Atorvastatin     Muscle aches at >40mg       PERTINENT MEDICATIONS:  Outpatient Encounter Medications as of 12/13/2018  Medication Sig  . AMPYRA 10 MG TB12 Take 10 mg by mouth 2 (two) times daily.   . baclofen (LIORESAL) 10 MG tablet Take 1 tablet (10 mg total) by mouth 3 (three) times daily.  . carbidopa-levodopa (SINEMET IR) 25-100 MG tablet Take by mouth.  . diclofenac sodium (VOLTAREN) 1 % GEL Apply topically 4 (four) times daily.  Marland Kitchen escitalopram (LEXAPRO) 5 MG tablet   . gabapentin (NEURONTIN) 800 MG tablet Take 800 mg by mouth 4 (four) times daily.   Marland Kitchen guaifenesin (ROBITUSSIN) 100 MG/5ML syrup Take 200 mg by mouth 3 (three) times daily as needed for cough.  . hydroxyurea (HYDREA) 500 MG capsule Take one by mouth Monday, Tuesday, Wed, Thursday, Friday  . levETIRAcetam (KEPPRA) 250 MG tablet   . metoCLOPramide (REGLAN) 5 MG tablet Take by mouth.  . Multiple Vitamin (MULTIVITAMIN) tablet Take 1 tablet by mouth daily.  . ondansetron (ZOFRAN) 4 MG tablet   . oxybutynin (DITROPAN) 5 MG tablet   . oxyCODONE (OXY IR/ROXICODONE) 5 MG immediate  release tablet Take 1 tablet (5 mg total) by mouth every 6 (six) hours as needed for severe pain.  Marland Kitchen oxyCODONE (OXY IR/ROXICODONE) 5 MG immediate release tablet Take 1 tablet (5 mg total) by mouth every 6 (six) hours as needed for severe pain.  Marland Kitchen rOPINIRole (REQUIP) 2 MG tablet Take 2 mg by mouth 4 (four) times daily.  Marland Kitchen tiZANidine (ZANAFLEX) 4 MG tablet   . XARELTO 20 MG TABS tablet Take 20 mg by mouth daily with breakfast.    No facility-administered encounter medications on file as of 12/13/2018.     PHYSICAL EXAM:   General: NAD, frail appearing, thin female, debilitated Cardiovascular: regular rate and rhythm Pulmonary: clear ant fields Abdomen: soft, nontender, + bowel sounds GU: no suprapubic tenderness Extremities: no edema, no joint deformities Skin: no rashes Neurological: Weakness but otherwise nonfocal/wheel dependent  Yashvi Jasinski Ihor Gully, NP

## 2018-12-17 ENCOUNTER — Ambulatory Visit: Payer: Medicare Other | Attending: Pain Medicine | Admitting: Pain Medicine

## 2018-12-17 ENCOUNTER — Encounter: Payer: Self-pay | Admitting: Pain Medicine

## 2018-12-17 VITALS — BP 112/59 | HR 71 | Temp 98.2°F | Resp 16 | Ht 62.0 in | Wt 111.0 lb

## 2018-12-17 DIAGNOSIS — Z7901 Long term (current) use of anticoagulants: Secondary | ICD-10-CM | POA: Diagnosis present

## 2018-12-17 DIAGNOSIS — M79601 Pain in right arm: Secondary | ICD-10-CM | POA: Diagnosis not present

## 2018-12-17 DIAGNOSIS — M542 Cervicalgia: Secondary | ICD-10-CM | POA: Diagnosis not present

## 2018-12-17 DIAGNOSIS — M5442 Lumbago with sciatica, left side: Secondary | ICD-10-CM | POA: Insufficient documentation

## 2018-12-17 DIAGNOSIS — M4802 Spinal stenosis, cervical region: Secondary | ICD-10-CM | POA: Diagnosis present

## 2018-12-17 DIAGNOSIS — M25561 Pain in right knee: Secondary | ICD-10-CM | POA: Diagnosis not present

## 2018-12-17 DIAGNOSIS — M25562 Pain in left knee: Secondary | ICD-10-CM | POA: Diagnosis present

## 2018-12-17 DIAGNOSIS — Z79899 Other long term (current) drug therapy: Secondary | ICD-10-CM

## 2018-12-17 DIAGNOSIS — M5030 Other cervical disc degeneration, unspecified cervical region: Secondary | ICD-10-CM

## 2018-12-17 DIAGNOSIS — E559 Vitamin D deficiency, unspecified: Secondary | ICD-10-CM

## 2018-12-17 DIAGNOSIS — Z79891 Long term (current) use of opiate analgesic: Secondary | ICD-10-CM

## 2018-12-17 DIAGNOSIS — G8929 Other chronic pain: Secondary | ICD-10-CM | POA: Diagnosis present

## 2018-12-17 DIAGNOSIS — G894 Chronic pain syndrome: Secondary | ICD-10-CM

## 2018-12-17 DIAGNOSIS — M62838 Other muscle spasm: Secondary | ICD-10-CM

## 2018-12-17 DIAGNOSIS — M792 Neuralgia and neuritis, unspecified: Secondary | ICD-10-CM | POA: Diagnosis present

## 2018-12-17 DIAGNOSIS — M79602 Pain in left arm: Secondary | ICD-10-CM | POA: Diagnosis present

## 2018-12-17 DIAGNOSIS — M5441 Lumbago with sciatica, right side: Secondary | ICD-10-CM

## 2018-12-17 DIAGNOSIS — G35 Multiple sclerosis: Secondary | ICD-10-CM | POA: Diagnosis present

## 2018-12-17 MED ORDER — TIZANIDINE HCL 4 MG PO TABS: 4.0000 mg | ORAL_TABLET | Freq: Three times a day (TID) | ORAL | 2 refills | Status: DC | PRN

## 2018-12-17 MED ORDER — BACLOFEN 10 MG PO TABS: 10.0000 mg | ORAL_TABLET | Freq: Four times a day (QID) | ORAL | 2 refills | Status: DC

## 2018-12-17 MED ORDER — MAGNESIUM 500 MG PO CAPS: 500.0000 mg | ORAL_CAPSULE | Freq: Two times a day (BID) | ORAL | 0 refills | Status: DC

## 2018-12-17 MED ORDER — BACLOFEN 10 MG PO TABS: 10.0000 mg | ORAL_TABLET | Freq: Three times a day (TID) | ORAL | 2 refills | Status: DC

## 2018-12-17 MED ORDER — GABAPENTIN 800 MG PO TABS: 800.0000 mg | ORAL_TABLET | Freq: Four times a day (QID) | ORAL | 2 refills | Status: DC

## 2018-12-17 MED ORDER — CALCIUM CARBONATE 600 MG PO TABS: 600.0000 mg | ORAL_TABLET | Freq: Two times a day (BID) | ORAL | 0 refills | Status: DC

## 2018-12-17 MED ORDER — OXYCODONE HCL 5 MG PO TABS: 5.0000 mg | ORAL_TABLET | Freq: Four times a day (QID) | ORAL | 0 refills | Status: AC | PRN

## 2018-12-17 MED ORDER — OXYCODONE HCL 5 MG PO TABS: 5.0000 mg | ORAL_TABLET | Freq: Four times a day (QID) | ORAL | 0 refills | Status: DC | PRN

## 2018-12-17 MED ORDER — VITAMIN D3 125 MCG (5000 UT) PO CAPS: 1.0000 | ORAL_CAPSULE | Freq: Every day | ORAL | 0 refills | Status: DC

## 2018-12-17 NOTE — Progress Notes (Signed)
Patient's Name: Cynthia Price  MRN: [id number]  Referring Provider: Anderson, Marshall W, MD  DOB: 08/27/1946  PCP: Anderson, Marshall W, MD  DOS: 12/17/2018  Note by:  A , MD  Service setting: Ambulatory outpatient  Specialty: Interventional Pain Management  Location: [facility] (AMB) Pain Management Facility    Patient type: Established   Primary Reason(s) for Visit: Encounter for prescription drug management. (Level of risk: moderate)  CC: Neck Pain (bilateral, right iw worse )  HPI  Cynthia Price is a 73 y.o. year old, female patient, who comes today for a medication management evaluation. She has HYPERCHOLESTEROLEMIA; Depression; Multiple sclerosis (HCC); NEUROPATHY; CYSTOCELE WITHOUT MENTION UTERINE PROLAPSE LAT; FATIGUE; UNS ADVRS EFF OTH RX MEDICINAL&BIOLOGICAL SBSTNC; Thrombocythemia, essential (HCC); Edema; Tachycardia; Anemia; Diastolic dysfunction; CAD (coronary artery disease); Urinary retention; Risk for falls; DVT (deep venous thrombosis) (HCC); RLS (restless legs syndrome); Bradycardia; Hypotension; Cellulitis, toe; UTI (urinary tract infection); Chronic diastolic heart failure (HCC); Complete rotator cuff rupture of shoulder (Left); Constipation; Contusion of shoulder; Deep vein thrombosis (HCC); Effusion of knee; Lumbar canal stenosis; HLD (hyperlipidemia); BP (high blood pressure); Infraspinatus tenosynovitis; 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HCC); DS (disseminated sclerosis) (HCC); Arthritis of knee, degenerative; Prolapse of urethra; Frequent UTI; Restless leg; Back sprain; Chest wall contusion; Neurogenic dysfunction of the urinary bladder; Vaginal atrophy; Intractable pain; Muscle spasticity; Inability to walk; Bladder mass; Chronic deep vein thrombosis (DVT) of proximal vein of left lower extremity (HCC); Degenerative cervical spinal stenosis; Encounter for general adult medical examination without abnormal findings; History of deep vein thrombosis (DVT) of lower  extremity; Lumbosacral radiculitis; Mild protein-calorie malnutrition (HCC); Rash; Resides in skilled nursing facility; Seizure-like activity (HCC); Ulcer, skin, non-healing, limited to breakdown of skin (HCC); Spondylosis of lumbar region without myelopathy or radiculopathy; Urinary retention with incomplete bladder emptying; Uterine procidentia; Complete tear of left rotator cuff; Rotator cuff tendinitis (Right); Depressive disorder, not elsewhere classified; Effusion of left knee; Recurrent UTI; Hyperlipidemia; Mild pulmonary hypertension (HCC); Hypertension; Multiple sclerosis, 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HCC); Neuromuscular dysfunction of bladder; Prolapse urethral mucosa; Restless legs syndrome (RLS); ET (essential thrombocythemia) (HCC); Vitamin D deficiency; Osteoarthritis of knee (Left); Chronic neck pain (Primary Area of Pain) (Bilateral) (R>L); Chronic low back pain (Tertiary Area of Pain) (Bilateral) (L>R) w/ sciatica (Bilateral); Chronic knee pain (Fourth Area of Pain) (Bilateral) (L>R); Chronic lower extremity pain (Bilateral) (L>R); Chronic pain syndrome; Pharmacologic therapy; Disorder of skeletal system; Problems influencing health status; Long term current use of opiate analgesic; Chronic upper extremity pain (Secondary Area of Pain) (Bilateral) (R>L); Long term current use of anticoagulant therapy (Xarelto); DDD (degenerative disc disease), cervical; Abnormal MRI, cervical spine (07/27/2018); Cervical central spinal stenosis; Cervical foraminal stenosis; Cervical facet arthropathy; Cervicalgia; Cervical facet syndrome (Bilateral) (R>L); Spastic diplegia (HCC); Palliative care encounter; Shortness of breath; Weakness generalized; and Neurogenic pain on their problem list. Her primarily concern today is the Neck Pain (bilateral, right iw worse )  Pain Assessment: Location: Left, Right(right is worse ) Neck Radiating: down into shoulders and down the arms into fingers.   Onset: More than a month  ago Duration: Chronic pain Quality: Discomfort, Shooting Severity: 0-No pain/10 (subjective, self-reported pain score)  Note: Reported level is compatible with observation.                               Effect on ADL: patient has MS.  affects her ROM and having some dexterity problems i.e. can't lift up the fork.  Timing: Intermittent   Modifying factors: oxycodone BP: (!) 112/59  HR: 71  Cynthia Price was last scheduled for an appointment on 12/03/2018 for medication management. During today's appointment we reviewed Cynthia Price's chronic pain status, as well as her outpatient medication regimen.  According to the patient her primary area of pain is in her neck. She admits that this pain is getting worse. She admits the right is greater than the left. She denies any previous surgery orinterventional therapy. She is currently in physical therapy at [facility] where she resides.  Her second area pain is in her arms. She admits that she has numbness tingling with burning going down both arms. She admits the right is worse than the left. She admits that it affects all of her fingers. Admits that physical therapy makes it worse however she continues at this time.  Her third area pain is in her lower back. She admits that he is status post spinal surgery in 2013 by Dr. Brown from [facility]. He admits that she is has not been able to walk the last year and a half. She admits that she did have difficulty initially after the surgery but this resolved. She has had interventional therapy but states that was approximately 10 years ago. She is doing physical therapy. She denies any recent images.  Her fourth area of pain is in her knees. She admits that the left is greater than the right. She denies any previous surgery. She has had interventional therapy which was effective. She again continues with physical therapy and has had recent images.  Her next area of pain is  just generalized leg pain. She admits that she is not able to walk because of the pain. She admits the pain is constant. She does have a history of DVT behind her left knee which she is currently on Xarelto. She admits that is been on this since 2018.  The patient  reports no history of drug use. Her body mass index is 20.3 kg/m.  Further details on both, my assessment(s), as well as the proposed treatment plan, please see below.  Controlled Substance Pharmacotherapy Assessment REMS (Risk Evaluation and Mitigation Strategy)  Analgesic: Oxycodone IR 5 mg every 6 hours (20 mg/day of oxycodone) MME/day: 30 mg/day.  Patterson, Delores G, RN  12/17/2018  9:53 AM  Sign when Signing Visit Nursing Pain Medication Assessment:  Safety precautions to be maintained throughout the outpatient stay will include: orient to surroundings, keep bed in low position, maintain call bell within reach at all times, provide assistance with transfer out of bed and ambulation.  Medication Inspection Compliance: Cynthia Price did not comply with our request to bring her pills to be counted. She was reminded that bringing the medication bottles, even when empty, is a requirement.  Medication: None brought in. Pill/Patch Count: None available to be counted. Bottle Appearance: No container available. Did not bring bottle(s) to appointment. Filled Date: N/A Last Medication intake:  Yesterday   Patient did not bring pills, states the staff at [facility] will not allow her to have the pills in her possession.    Pharmacokinetics: Liberation and absorption (onset of action): WNL Distribution (time to peak effect): WNL Metabolism and excretion (duration of action): WNL         Pharmacodynamics: Desired effects: Analgesia: Cynthia Price reports >50% benefit. Functional ability: Patient reports that medication allows her to accomplish basic ADLs Clinically meaningful improvement in function (CMIF): Sustained CMIF goals  met Perceived effectiveness: Described as   relatively effective, allowing for increase in activities of daily living (ADL) Undesirable effects: Side-effects or Adverse reactions: None reported Monitoring: [place] PMP: Online review of the past 50-monthperiod conducted. Compliant with practice rules and regulations Last UDS on record: Summary  Date Value Ref Range Status  08/27/2018 FINAL  Final    Comment:    ==================================================================== [facility]RE COMP DRUG ANALYSIS,UR ==================================================================== Test                             Result       Flag       Units Drug Present and Declared for Prescription Verification   Oxycodone                      995          EXPECTED   ng/mg creat   Oxymorphone                    945          EXPECTED   ng/mg creat   Noroxycodone                   4186         EXPECTED   ng/mg creat   Noroxymorphone                 409          EXPECTED   ng/mg creat    Sources of oxycodone are scheduled prescription medications.    Oxymorphone, noroxycodone, and noroxymorphone are expected    metabolites of oxycodone. Oxymorphone is also available as a    scheduled prescription medication.   Gabapentin                     PRESENT      EXPECTED   Baclofen                       PRESENT      EXPECTED   Citalopram                     PRESENT      EXPECTED   Desmethylcitalopram            PRESENT      EXPECTED    Desmethylcitalopram is an expected metabolite of citalopram or    the enantiomeric form, escitalopram. Drug Present not Declared for Prescription Verification   Acetaminophen                  PRESENT      UNEXPECTED Drug Absent but Declared for Prescription Verification   Levetiracetam                  Not Detected UNEXPECTED   Tizanidine                     Not Detected UNEXPECTED    Tizanidine, as indicated in the declared medication list, is not    always detected even when used as  directed.   Diclofenac                     Not Detected UNEXPECTED    Diclofenac, as indicated in the declared medication list, is not    always detected even when used as directed.   Guaifenesin  Not Detected UNEXPECTED ==================================================================== Test                      Result    Flag   Units      Ref Range   Creatinine              22               mg/dL      >=20 ==================================================================== Declared Medications:  The flagging and interpretation on this report are based on the  following declared medications.  Unexpected results may arise from  inaccuracies in the declared medications.  **Note: The testing scope of this panel includes these medications:  Baclofen  Escitalopram  Gabapentin  Guaifenesin  Levetiracetam  Oxycodone  **Note: The testing scope of this panel does not include small to  moderate amounts of these reported medications:  Diclofenac  Tizanidine  **Note: The testing scope of this panel does not include following  reported medications:  Carbidopa (Carbidopa/Levodopa)  Dalfampridine  Hydroxyurea  Levodopa (Carbidopa/Levodopa)  Metoclopramide  Multivitamin  Ondansetron  Oxybutynin  Rivaroxaban  Ropinirole ==================================================================== For clinical consultation, please call [phone]. ====================================================================    UDS interpretation: Compliant          Medication Assessment Form: Reviewed. Patient indicates being compliant with therapy Treatment compliance: Compliant Risk Assessment Profile: Aberrant behavior: See prior evaluations. None observed or detected today Comorbid factors increasing risk of overdose: See prior notes. No additional risks detected today Opioid risk tool (ORT) (Total Score): 4 Personal History of Substance Abuse (SUD-Substance use disorder):   Alcohol: Negative  Illegal Drugs: Negative  Rx Drugs: Negative  ORT Risk Level calculation: Moderate Risk Risk of substance use disorder (SUD): Low Opioid Risk Tool - 12/17/18 0944      Family History of Substance Abuse   Alcohol  Positive Female    Illegal Drugs  Negative    Rx Drugs  Negative      Personal History of Substance Abuse   Alcohol  Negative    Illegal Drugs  Negative    Rx Drugs  Negative      Age   Age between 105-45 years   No      Psychological Disease   Psychological Disease  Positive    ADD  Negative    OCD  Negative    Bipolar  Negative    Schizophrenia  Negative    Depression  Positive      Total Score   Opioid Risk Tool Scoring  4    Opioid Risk Interpretation  Moderate Risk      ORT Scoring interpretation table:  Score <3 = Low Risk for SUD  Score between 4-7 = Moderate Risk for SUD  Score >8 = High Risk for Opioid Abuse   Risk Mitigation Strategies:  Patient Counseling: Covered Patient-Prescriber Agreement (PPA): Present and active  Notification to other healthcare providers: Done  Pharmacologic Plan: No change in therapy, at this time.             Laboratory Chemistry  Inflammation Markers (CRP: Acute Phase) (ESR: Chronic Phase) Lab Results  Component Value Date   CRP 2 08/27/2018   ESRSEDRATE 43 (H) 08/27/2018                         Rheumatology Markers No results found.  Renal Function Markers Lab Results  Component Value Date   BUN 22 08/27/2018   CREATININE  0.86 08/27/2018   BCR 26 08/27/2018   GFRAA 78 08/27/2018   GFRNONAA 68 08/27/2018                             Hepatic Function Markers Lab Results  Component Value Date   AST 34 08/27/2018   ALT 15 11/03/2017   ALBUMIN 4.4 08/27/2018   ALKPHOS 88 08/27/2018   LIPASE 30 06/29/2012                        Electrolytes Lab Results  Component Value Date   NA 142 08/27/2018   K 4.4 08/27/2018   CL 100 08/27/2018   CALCIUM 9.3 08/27/2018   MG 2.2  08/27/2018                        Neuropathy Markers Lab Results  Component Value Date   VITAMINB12 492 08/27/2018                        CNS Tests No results found.  Bone Pathology Markers Lab Results  Component Value Date   VD25OH 24 (L) 04/29/2010   25OHVITD1 38 08/27/2018   25OHVITD2 <1.0 08/27/2018   25OHVITD3 38 08/27/2018                         Coagulation Parameters Lab Results  Component Value Date   INR 0.9 05/03/2012   LABPROT 12.5 05/03/2012   PLT 383 11/03/2017                        Cardiovascular Markers Lab Results  Component Value Date   TROPONINI <0.03 06/14/2015   HGB 12.1 11/03/2017   HCT 36.7 11/03/2017                         CA Markers No results found.  Note: Lab results reviewed.  Recent Diagnostic Imaging Results  MR CERVICAL SPINE WO CONTRAST CLINICAL DATA:  Neck pain radiating into the arms with numbness and tingling for several months. History of multiple sclerosis and multiple falls.  EXAM: MRI CERVICAL SPINE WITHOUT CONTRAST  TECHNIQUE: Multiplanar, multisequence MR imaging of the cervical spine was performed. No intravenous contrast was administered.  COMPARISON:  Cervical spine CT 08/21/2017  FINDINGS: Alignment: Exaggerated upper thoracic kyphosis.  No listhesis.  Vertebrae: No fracture, suspicious osseous lesion, or significant marrow edema.  Cord: T2 hyperintense lesions in the cord on the left at C4, on the right at C5, and centrally/dorsally at C7-T1. Normal to mildly decreased cord volume at these levels.  Posterior Fossa, vertebral arteries, paraspinal tissues: Partially visualized T2 hyperintensities in the pons. Partially visualized cerebellar atrophy and or old infarcts. Preserved vertebral artery flow voids.  Disc levels:  C2-3: Minimal facet arthrosis without significant stenosis.  C3-4: Mild disc bulging, uncovertebral spurring, and mild facet arthrosis result in mild spinal stenosis and mild  bilateral neural foraminal stenosis.  C4-5: Disc bulging, uncovertebral spurring, and mild facet arthrosis result in mild spinal stenosis and moderate right and mild left neural foraminal stenosis.  C5-6: Moderate disc space narrowing. Disc bulging, uncovertebral spurring, and mild facet arthrosis result in mild spinal stenosis and moderate to severe right and mild left neural foraminal stenosis.  C6-7: Moderate disc space narrowing. Mild disc bulging and uncovertebral   spurring result in borderline left neural foraminal stenosis without spinal stenosis.  C7-T1: Negative.  IMPRESSION: 1. Cervical disc degeneration most notable at C5-6 where there is mild spinal stenosis and moderate to severe right neural foraminal stenosis. 2. Mild spinal stenosis and mild-to-moderate neural foraminal stenosis at C3-4 and C4-5. 3. Multiple cervical cord lesions consistent with the history of multiple sclerosis.  Electronically Signed   By: Logan Bores M.D.   On: 07/27/2018 16:07  Complexity Note: Imaging results reviewed. Results shared with Cynthia Price, using Layman's terms.                         Meds   Current Outpatient Medications:  .  AMPYRA 10 MG TB12, Take 10 mg by mouth 2 (two) times daily. , Disp: , Rfl:  .  baclofen (LIORESAL) 10 MG tablet, Take 1 tablet (10 mg total) by mouth 4 (four) times daily., Disp: 120 tablet, Rfl: 2 .  carbidopa-levodopa (SINEMET IR) 25-100 MG tablet, Take by mouth., Disp: , Rfl:  .  diclofenac sodium (VOLTAREN) 1 % GEL, Apply topically 4 (four) times daily., Disp: , Rfl:  .  escitalopram (LEXAPRO) 5 MG tablet, , Disp: , Rfl:  .  gabapentin (NEURONTIN) 800 MG tablet, Take 1 tablet (800 mg total) by mouth 4 (four) times daily., Disp: 120 tablet, Rfl: 2 .  hydroxyurea (HYDREA) 500 MG capsule, Take one by mouth Monday, Tuesday, Wed, Thursday, Friday, Disp: 90 capsule, Rfl: 3 .  levETIRAcetam (KEPPRA) 250 MG tablet, , Disp: , Rfl:  .  metoCLOPramide (REGLAN)  5 MG tablet, Take by mouth., Disp: , Rfl:  .  Multiple Vitamin (MULTIVITAMIN) tablet, Take 1 tablet by mouth daily., Disp: , Rfl:  .  ondansetron (ZOFRAN) 4 MG tablet, , Disp: , Rfl:  .  oxybutynin (DITROPAN) 5 MG tablet, , Disp: , Rfl:  .  [START ON 02/15/2019] oxyCODONE (OXY IR/ROXICODONE) 5 MG immediate release tablet, Take 1 tablet (5 mg total) by mouth every 6 (six) hours as needed for up to 30 days for severe pain. Must Last 30 days., Disp: 120 tablet, Rfl: 0 .  [START ON 01/16/2019] oxyCODONE (OXY IR/ROXICODONE) 5 MG immediate release tablet, Take 1 tablet (5 mg total) by mouth every 6 (six) hours as needed for up to 30 days for severe pain. Must last 30 days., Disp: 120 tablet, Rfl: 0 .  oxyCODONE (OXY IR/ROXICODONE) 5 MG immediate release tablet, Take 1 tablet (5 mg total) by mouth every 6 (six) hours as needed for up to 30 days for severe pain. Must last 30 days., Disp: 120 tablet, Rfl: 0 .  rOPINIRole (REQUIP) 2 MG tablet, Take 2 mg by mouth 4 (four) times daily., Disp: , Rfl:  .  tiZANidine (ZANAFLEX) 4 MG tablet, Take 1 tablet (4 mg total) by mouth every 8 (eight) hours as needed for muscle spasms., Disp: 90 tablet, Rfl: 2 .  XARELTO 20 MG TABS tablet, Take 20 mg by mouth daily with breakfast. , Disp: , Rfl:  .  calcium carbonate (CALCIUM 600) 600 MG TABS tablet, Take 1 tablet (600 mg total) by mouth 2 (two) times daily with a meal., Disp: 180 tablet, Rfl: 0 .  Cholecalciferol (VITAMIN D3) 125 MCG (5000 UT) CAPS, Take 1 capsule (5,000 Units total) by mouth daily with breakfast. Take along with calcium and magnesium., Disp: 90 capsule, Rfl: 0 .  Magnesium 500 MG CAPS, Take 1 capsule (500 mg total) by mouth 2 (two)  times daily at 8 am and 10 pm., Disp: 180 capsule, Rfl: 0  ROS  Constitutional: Denies any fever or chills Gastrointestinal: No reported hemesis, hematochezia, vomiting, or acute GI distress Musculoskeletal: Denies any acute onset joint swelling, redness, loss of ROM, or  weakness Neurological: No reported episodes of acute onset apraxia, aphasia, dysarthria, agnosia, amnesia, paralysis, loss of coordination, or loss of consciousness  Allergies  Cynthia Price is allergic to atorvastatin.  PFSH  Drug: Cynthia Price  reports no history of drug use. Alcohol:  reports no history of alcohol use. Tobacco:  reports that she has never smoked. She has never used smokeless tobacco. Medical:  has a past medical history of Anemia (2012), Back pain, Blood clot in vein, BP (high blood pressure) (03/08/2014), CAD (coronary artery disease) (10/18/2011), Chronic diastolic heart failure (HCC) (09/07/2014), Collagen vascular disease (HCC), Depression, DVT (deep venous thrombosis) (HCC) (05/14/2012), Essential thrombocytosis (HCC), Hyperlipidemia, 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HCC) (03/05/2015), Hypotension (03/26/2013), IBS (irritable bowel syndrome), Insomnia, Lupus anticoagulant positive, Macular degeneration, MS (multiple sclerosis) (HCC), Multiple sclerosis (HCC) (04/29/2008), NEUROPATHY (04/29/2008), RLS (restless legs syndrome), Spinal stenosis, and Urinary retention. Surgical: Cynthia Price  has a past surgical history that includes Appendectomy; Cholecystectomy; Lumbar laminectomy; Cholecystectomy; Tonsillectomy; Lumbar disc surgery; and Anterior lumbar fusion (2012). Family: family history includes Alcohol abuse in her father; Diabetes in her brother and sister; Heart disease in her father and mother; Multiple sclerosis in her cousin and paternal aunt; Ovarian cancer in her mother; Thyroid cancer in her mother.  Constitutional Exam  General appearance: Well nourished, well developed, and well hydrated. In no apparent acute distress Vitals:   12/17/18 0935  BP: (!) 112/59  Pulse: 71  Resp: 16  Temp: 98.2 F (36.8 C)  TempSrc: Oral  SpO2: 95%  Weight: 111 lb (50.3 kg)  Height: 5' 2" (1.575 m)   BMI Assessment: Estimated body mass index is 20.3 kg/m as calculated from the  following:   Height as of this encounter: 5' 2" (1.575 m).   Weight as of this encounter: 111 lb (50.3 kg).  BMI interpretation table: BMI level Category Range association with higher incidence of chronic pain  <18 kg/m2 Underweight   18.5-24.9 kg/m2 Ideal body weight   25-29.9 kg/m2 Overweight Increased incidence by 20%  30-34.9 kg/m2 Obese (Class I) Increased incidence by 68%  35-39.9 kg/m2 Severe obesity (Class II) Increased incidence by 136%  >40 kg/m2 Extreme obesity (Class III) Increased incidence by 254%   Patient's current BMI Ideal Body weight  Body mass index is 20.3 kg/m. Ideal body weight: 50.1 kg (110 lb 7.2 oz) Adjusted ideal body weight: 50.2 kg (110 lb 10.7 oz)   BMI Readings from Last 4 Encounters:  12/17/18 20.30 kg/m  12/13/18 20.82 kg/m  11/13/18 21.29 kg/m  09/17/18 21.29 kg/m   Wt Readings from Last 4 Encounters:  12/17/18 111 lb (50.3 kg)  12/13/18 106 lb 9.6 oz (48.4 kg)  11/13/18 109 lb (49.4 kg)  09/17/18 109 lb (49.4 kg)  Psych/Mental status: Alert, oriented x 3 (person, place, & time)       Eyes: PERLA Respiratory: No evidence of acute respiratory distress  Cervical Spine Area Exam  Skin & Axial Inspection: Significant cervical kyphosis Alignment: Asymmetric Functional ROM: Minimal ROM      Stability: No instability detected Muscle Tone/Strength: Guarding observed Sensory (Neurological): Movement-associated discomfort Palpation: Complains of area being tender to palpation              Upper Extremity (UE)   Exam    Side: Right upper extremity  Side: Left upper extremity  Skin & Extremity Inspection: Skin color, temperature, and hair growth are WNL. No peripheral edema or cyanosis. No masses, redness, swelling, asymmetry, or associated skin lesions. No contractures.  Skin & Extremity Inspection: Skin color, temperature, and hair growth are WNL. No peripheral edema or cyanosis. No masses, redness, swelling, asymmetry, or associated skin lesions.  No contractures.  Functional ROM: Unrestricted ROM          Functional ROM: Unrestricted ROM          Muscle Tone/Strength: Functionally intact. No obvious neuro-muscular anomalies detected.  Muscle Tone/Strength: Functionally intact. No obvious neuro-muscular anomalies detected.  Sensory (Neurological): Unimpaired          Sensory (Neurological): Unimpaired          Palpation: No palpable anomalies              Palpation: No palpable anomalies              Provocative Test(s):  Phalen's test: deferred Tinel's test: deferred Apley's scratch test (touch opposite shoulder):  Action 1 (Across chest): deferred Action 2 (Overhead): deferred Action 3 (LB reach): deferred   Provocative Test(s):  Phalen's test: deferred Tinel's test: deferred Apley's scratch test (touch opposite shoulder):  Action 1 (Across chest): deferred Action 2 (Overhead): deferred Action 3 (LB reach): deferred    Thoracic Spine Area Exam  Skin & Axial Inspection: prominent thoracic Kyphosis Alignment: Asymmetric Functional ROM: Unrestricted ROM Stability: No instability detected Muscle Tone/Strength: Functionally intact. No obvious neuro-muscular anomalies detected. Sensory (Neurological): Unimpaired Muscle strength & Tone: No palpable anomalies  Lumbar Spine Area Exam  Skin & Axial Inspection: No masses, redness, or swelling Alignment: Symmetrical Functional ROM: Minimal ROM       Stability: No instability detected Muscle Tone/Strength: Functionally intact. No obvious neuro-muscular anomalies detected. Sensory (Neurological): Unimpaired Palpation: No palpable anomalies       Provocative Tests: Hyperextension/rotation test: deferred today       Lumbar quadrant test (Kemp's test): deferred today       Lateral bending test: deferred today       Patrick's Maneuver: deferred today                   FABER* test: deferred today                   S-I anterior distraction/compression test: deferred today          S-I lateral compression test: deferred today         S-I Thigh-thrust test: deferred today         S-I Gaenslen's test: deferred today         *(Flexion, ABduction and External Rotation)  Gait & Posture Assessment  Ambulation: Patient ambulates using a wheel chair Gait: Significantly limited. Dependent on assistive device to ambulate Posture: Difficulty standing up straight, due to pain   Lower Extremity Exam    Side: Right lower extremity  Side: Left lower extremity  Stability: No instability observed          Stability: No instability observed          Skin & Extremity Inspection: Skin color, temperature, and hair growth are WNL. No peripheral edema or cyanosis. No masses, redness, swelling, asymmetry, or associated skin lesions. No contractures.  Skin & Extremity Inspection: Skin color, temperature, and hair growth are WNL. No peripheral edema or cyanosis. No masses, redness, swelling,  asymmetry, or associated skin lesions. No contractures.  Functional ROM: Decreased ROM for hip and knee joints          Functional ROM: Decreased ROM for hip and knee joints          Muscle Tone/Strength: Functionally intact. No obvious neuro-muscular anomalies detected.  Muscle Tone/Strength: Moderate-to-severe deconditioning  Sensory (Neurological): Unimpaired        Sensory (Neurological): Unimpaired        DTR: Patellar: deferred today Achilles: deferred today Plantar: deferred today  DTR: Patellar: deferred today Achilles: deferred today Plantar: deferred today  Palpation: No palpable anomalies  Palpation: No palpable anomalies   Assessment  Primary Diagnosis & Pertinent Problem List: The primary encounter diagnosis was Chronic neck pain (Primary Area of Pain) (Bilateral) (R>L). 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  Status Diagnosis  Unimproved Unimproved Unimproved 1. Chronic neck pain (Primary Area of Pain) (Bilateral) (R>L)   2. Chronic upper extremity pain (Secondary Area of Pain) (Bilateral) (R>L)   3. 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 Chronic knee pain (Fourth Area of Pain) (Bilateral) (L>R)   5. Cervical central spinal stenosis   6. Cervical foraminal stenosis   7. DDD (degenerative disc disease), cervical   8. Chronic pain syndrome   9. Pharmacologic therapy   10. Long term current use of opiate analgesic   11. Long term current use of anticoagulant therapy (Xarelto)   12. Multiple sclerosis (HCC)   13. Muscle spasticity   14. Neurogenic pain   15. Vitamin D deficiency     Problems updated and reviewed during this visit: Problem  Neurogenic Pain   Plan of Care  Pharmacotherapy (Medications Ordered): Meds ordered this encounter  Medications  . tiZANidine (ZANAFLEX) 4 MG tablet    Sig: Take 1 tablet (4 mg total) by mouth every 8 (eight) hours as needed for muscle spasms.    Dispense:  90 tablet    Refill:  2    Do not place medication on "Automatic Refill". Fill one day early if pharmacy is closed on scheduled refill date.  . gabapentin (NEURONTIN) 800 MG tablet    Sig: Take 1 tablet (800 mg total) by mouth 4 (four) times daily.    Dispense:  120 tablet    Refill:  2    Do not place medication on "Automatic Refill". Fill one day early if pharmacy is closed on scheduled refill date.  . oxyCODONE (OXY IR/ROXICODONE) 5 MG immediate release  tablet    Sig: Take 1 tablet (5 mg total) by mouth every 6 (six) hours as needed for up to 30 days for severe pain. Must Last 30 days.    Dispense:  120 tablet    Refill:  0    [place] STOP ACT - Not applicable. Fill one day early if pharmacy is closed on scheduled refill date. Do not fill until: 02/15/19. Must last 30 days. To last until: 03/17/19.  . oxyCODONE (OXY IR/ROXICODONE) 5 MG immediate release tablet      Sig: Take 1 tablet (5 mg total) by mouth every 6 (six) hours as needed for up to 30 days for severe pain. Must last 30 days.    Dispense:  120 tablet    Refill:  0    [place] STOP ACT - Not applicable. Fill one day early if pharmacy is closed on scheduled refill date. Do not fill until: 01/16/19. Must last 30 days. To last until: 02/15/19.  . DISCONTD: baclofen (LIORESAL) 10 MG tablet    Sig: Take 1 tablet (10 mg total) by mouth 3 (three) times daily.    Dispense:  90 tablet    Refill:  2    Do not place medication on "Automatic Refill". Fill one day early if pharmacy is closed on scheduled refill date.  . oxyCODONE (OXY IR/ROXICODONE) 5 MG immediate release tablet    Sig: Take 1 tablet (5 mg total) by mouth every 6 (six) hours as needed for up to 30 days for severe pain. Must last 30 days.    Dispense:  120 tablet    Refill:  0    [place] STOP ACT - Not applicable. Fill one day early if pharmacy is closed on scheduled refill date. Do not fill until: 12/17/18. Must last 30 days. To last until: 01/16/19.  . Cholecalciferol (VITAMIN D3) 125 MCG (5000 UT) CAPS    Sig: Take 1 capsule (5,000 Units total) by mouth daily with breakfast. Take along with calcium and magnesium.    Dispense:  90 capsule    Refill:  0    Do not place medication on "Automatic Refill".  May substitute with similar over-the-counter product.  . Magnesium 500 MG CAPS    Sig: Take 1 capsule (500 mg total) by mouth 2 (two) times daily at 8 am and 10 pm.    Dispense:  180 capsule    Refill:  0    Do not place medication on  "Automatic Refill".  The patient may use similar over-the-counter product.  . calcium carbonate (CALCIUM 600) 600 MG TABS tablet    Sig: Take 1 tablet (600 mg total) by mouth 2 (two) times daily with a meal.    Dispense:  180 tablet    Refill:  0    Do not place medication on "Automatic Refill". Fill one day early if pharmacy is closed on scheduled refill date.  . baclofen (LIORESAL) 10 MG tablet    Sig: Take 1 tablet (10 mg total) by mouth 4 (four) times daily.    Dispense:  120 tablet    Refill:  2    Do not place medication on "Automatic Refill". Fill one day early if pharmacy is closed on scheduled refill date.   Medications administered today: Cynthia Price had no medications administered during this visit.   Procedure Orders     Cervical Epidural Injection  Lab Orders     ToxASSURE Select 13 (MW), Urine Imaging Orders  No imaging studies ordered today   Referral Orders  No referral(s) requested today   Interventional management options: Planned, scheduled, and/or pending:   NOTE: Stop XARELTO x 3 days, prior to procedures. Restart 6 hours after. Diagnostic right-sided CESI #1 under fluoroscopy, no sedation (May have to do it w/o fluoroscopy, due to positional restrictions)   Considering:   Diagnosticright-sided cervical ESI  Diagnostic bilateral cervical facet nerve block  Possible bilateral cervical facet RFA  Diagnostic bilateral lumbar facet block  Possible bilateral lumbar facet RFA  Diagnostic left-sided LESI  Diagnostic   bilateral intra-articular knee injections  Possible series of 5, bilateral intra-articular Hyalgan knee injections  Diagnostic bilateral genicular nerve blocks  Possible bilateral genicular nerve RFA    Palliative PRN treatment(s):   None at this time   Provider-requested follow-up: Return for Procedure (no sedation): (R) CESI #1, (Blood-thinner Protocol).  Future Appointments  Date Time Provider Department Center  01/01/2019  1:45 PM  , , MD [facility] None  02/13/2019  3:00 PM Sater, Richard A, MD [facility] None   Primary Care Physician: Anderson, Marshall W, MD Location: [facility] Note by:  A , MD Date: 12/17/2018; Time: 10:38 AM 

## 2018-12-17 NOTE — Progress Notes (Signed)
Nursing Pain Medication Assessment:  Safety precautions to be maintained throughout the outpatient stay will include: orient to surroundings, keep bed in low position, maintain call bell within reach at all times, provide assistance with transfer out of bed and ambulation.  Medication Inspection Compliance: Cynthia Price did not comply with our request to bring her pills to be counted. She was reminded that bringing the medication bottles, even when empty, is a requirement.  Medication: None brought in. Pill/Patch Count: None available to be counted. Bottle Appearance: No container available. Did not bring bottle(s) to appointment. Filled Date: N/A Last Medication intake:  Yesterday   Patient did not bring pills, states the staff at [facility] will not allow her to have the pills in her possession.

## 2018-12-17 NOTE — Patient Instructions (Addendum)
____________________________________________________________________________________________  Preparing for your procedure (without sedation)  Instructions: . Oral Intake: Do not eat or drink anything for at least 3 hours prior to your procedure. . Transportation: Unless otherwise stated by your physician, you may drive yourself after the procedure. . Blood Pressure Medicine: Take your blood pressure medicine with a sip of water the morning of the procedure. . Blood thinners: Notify our staff if you are taking any blood thinners. Depending on which one you take, there will be specific instructions on how and when to stop it. . Diabetics on insulin: Notify the staff so that you can be scheduled 1st case in the morning. If your diabetes requires high dose insulin, take only  of your normal insulin dose the morning of the procedure and notify the staff that you have done so. . Preventing infections: Shower with an antibacterial soap the morning of your procedure.  . Build-up your immune system: Take 1000 mg of Vitamin C with every meal (3 times a day) the day prior to your procedure. Marland Kitchen Antibiotics: Inform the staff if you have a condition or reason that requires you to take antibiotics before dental procedures. . Pregnancy: If you are pregnant, call and cancel the procedure. . Sickness: If you have a cold, fever, or any active infections, call and cancel the procedure. . Arrival: You must be in the facility at least 30 minutes prior to your scheduled procedure. . Children: Do not bring any children with you. . Dress appropriately: Bring dark clothing that you would not mind if they get stained. . Valuables: Do not bring any jewelry or valuables.  Procedure appointments are reserved for interventional treatments only. Marland Kitchen No Prescription Refills. . No medication changes will be discussed during procedure appointments. . No disability issues will be discussed.  Reasons to call and reschedule or  cancel your procedure: (Following these recommendations will minimize the risk of a serious complication.) . Surgeries: Avoid having procedures within 2 weeks of any surgery. (Avoid for 2 weeks before or after any surgery). . Flu Shots: Avoid having procedures within 2 weeks of a flu shots or . (Avoid for 2 weeks before or after immunizations). . Barium: Avoid having a procedure within 7-10 days after having had a radiological study involving the use of radiological contrast. (Myelograms, Barium swallow or enema study). . Heart attacks: Avoid any elective procedures or surgeries for the initial 6 months after a "Myocardial Infarction" (Heart Attack). . Blood thinners: It is imperative that you stop these medications before procedures. Let us know if you if you take any blood thinner.  . Infection: Avoid procedures during or within two weeks of an infection (including chest colds or gastrointestinal problems). Symptoms associated with infections include: Localized redness, fever, chills, night sweats or profuse sweating, burning sensation when voiding, cough, congestion, stuffiness, runny nose, sore throat, diarrhea, nausea, vomiting, cold or Flu symptoms, recent or current infections. It is specially important if the infection is over the area that we intend to treat. Marland Kitchen Heart and lung problems: Symptoms that may suggest an active cardiopulmonary problem include: cough, chest pain, breathing difficulties or shortness of breath, dizziness, ankle swelling, uncontrolled high or unusually low blood pressure, and/or palpitations. If you are experiencing any of these symptoms, cancel your procedure and contact your primary care physician for an evaluation.  Remember:  Regular Business hours are:  Monday to Thursday 8:00 AM to 4:00 PM  Provider's Schedule: Milinda Pointer, MD:  Procedure days: Tuesday and Thursday 7:30 AM to  4:00 PM  Gillis Santa, MD:  Procedure days: Monday and Wednesday 7:30 AM to 4:00  PM ____________________________________________________________________________________________    ______________________________________________________________________________________________  Specialty Pain Scale  Introduction:  There are significant differences in how pain is reported. The word pain usually refers to physical pain, but it is also a common synonym of suffering. The medical community uses a scale from 0 (zero) to 10 (ten) to report pain level. Zero (0) is described as "no pain", while ten (10) is described as "the worse pain you can imagine". The problem with this scale is that physical pain is reported along with suffering. Suffering refers to mental pain, or more often yet it refers to any unpleasant feeling, emotion or aversion associated with the perception of harm or threat of harm. It is the psychological component of pain.  Pain Specialists prefer to separate the two components. The pain scale used by this practice is the Verbal Numerical Rating Scale (VNRS-11). This scale is for the physical pain only. DO NOT INCLUDE how your pain psychologically affects you. This scale is for adults 29 years of age and older. It has 11 (eleven) levels. The 1st level is 0/10. This means: "right now, I have no pain". In the context of pain management, it also means: "right now, my physical pain is under control with the current therapy".  General Information:  The scale should reflect your current level of pain. Unless you are specifically asked for the level of your worst pain, or your average pain. If you are asked for one of these two, then it should be understood that it is over the past 24 hours.  Levels 1 (one) through 5 (five) are described below, and can be treated as an outpatient. Ambulatory pain management facilities such as ours are more than adequate to treat these levels. Levels 6 (six) through 10 (ten) are also described below, however, these must be treated as a hospitalized  patient. While levels 6 (six) and 7 (seven) may be evaluated at an urgent care facility, levels 8 (eight) through 10 (ten) constitute medical emergencies and as such, they belong in a hospital's emergency department. When having these levels (as described below), do not come to our office. Our facility is not equipped to manage these levels. Go directly to an urgent care facility or an emergency department to be evaluated.  Definitions:  Activities of Daily Living (ADL): Activities of daily living (ADL or ADLs) is a term used in healthcare to refer to people's daily self-care activities. Health professionals often use a person's ability or inability to perform ADLs as a measurement of their functional status, particularly in regard to people post injury, with disabilities and the elderly. There are two ADL levels: Basic and Instrumental. Basic Activities of Daily Living (BADL  or BADLs) consist of self-care tasks that include: Bathing and showering; personal hygiene and grooming (including brushing/combing/styling hair); dressing; Toilet hygiene (getting to the toilet, cleaning oneself, and getting back up); eating and self-feeding (not including cooking or chewing and swallowing); functional mobility, often referred to as "transferring", as measured by the ability to walk, get in and out of bed, and get into and out of a chair; the broader definition (moving from one place to another while performing activities) is useful for people with different physical abilities who are still able to get around independently. Basic ADLs include the things many people do when they get up in the morning and get ready to go out of the house: get out  of bed, go to the toilet, bathe, dress, groom, and eat. On the average, loss of function typically follows a particular order. Hygiene is the first to go, followed by loss of toilet use and locomotion. The last to go is the ability to eat. When there is only one remaining area in  which the person is independent, there is a 62.9% chance that it is eating and only a 3.5% chance that it is hygiene. Instrumental Activities of Daily Living (IADL or IADLs) are not necessary for fundamental functioning, but they let an individual live independently in a community. IADL consist of tasks that include: cleaning and maintaining the house; home establishment and maintenance; care of others (including selecting and supervising caregivers); care of pets; child rearing; managing money; managing financials (investments, etc.); meal preparation and cleanup; shopping for groceries and necessities; moving within the community; safety procedures and emergency responses; health management and maintenance (taking prescribed medications); and using the telephone or other form of communication.  Instructions:  Most patients tend to report their pain as a combination of two factors, their physical pain and their psychosocial pain. This last one is also known as "suffering" and it is reflection of how physical pain affects you socially and psychologically. From now on, report them separately.  From this point on, when asked to report your pain level, report only your physical pain. Use the following table for reference.  Pain Clinic Pain Levels (0-5/10)  Pain Level Score  Description  No Pain 0   Mild pain 1 Nagging, annoying, but does not interfere with basic activities of daily living (ADL). Patients are able to eat, bathe, get dressed, toileting (being able to get on and off the toilet and perform personal hygiene functions), transfer (move in and out of bed or a chair without assistance), and maintain continence (able to control bladder and bowel functions). Blood pressure and heart rate are unaffected. A normal heart rate for a healthy adult ranges from 60 to 100 bpm (beats per minute).   Mild to moderate pain 2 Noticeable and distracting. Impossible to hide from other people. More frequent  flare-ups. Still possible to adapt and function close to normal. It can be very annoying and may have occasional stronger flare-ups. With discipline, patients may get used to it and adapt.   Moderate pain 3 Interferes significantly with activities of daily living (ADL). It becomes difficult to feed, bathe, get dressed, get on and off the toilet or to perform personal hygiene functions. Difficult to get in and out of bed or a chair without assistance. Very distracting. With effort, it can be ignored when deeply involved in activities.   Moderately severe pain 4 Impossible to ignore for more than a few minutes. With effort, patients may still be able to manage work or participate in some social activities. Very difficult to concentrate. Signs of autonomic nervous system discharge are evident: dilated pupils (mydriasis); mild sweating (diaphoresis); sleep interference. Heart rate becomes elevated (>115 bpm). Diastolic blood pressure (lower number) rises above 100 mmHg. Patients find relief in laying down and not moving.   Severe pain 5 Intense and extremely unpleasant. Associated with frowning face and frequent crying. Pain overwhelms the senses.  Ability to do any activity or maintain social relationships becomes significantly limited. Conversation becomes difficult. Pacing back and forth is common, as getting into a comfortable position is nearly impossible. Pain wakes you up from deep sleep. Physical signs will be obvious: pupillary dilation; increased sweating; goosebumps; brisk reflexes; cold,  clammy hands and feet; nausea, vomiting or dry heaves; loss of appetite; significant sleep disturbance with inability to fall asleep or to remain asleep. When persistent, significant weight loss is observed due to the complete loss of appetite and sleep deprivation.  Blood pressure and heart rate becomes significantly elevated. Caution: If elevated blood pressure triggers a pounding headache associated with blurred  vision, then the patient should immediately seek attention at an urgent or emergency care unit, as these may be signs of an impending stroke.    Emergency Department Pain Levels (6-10/10)  Emergency Room Pain 6 Severely limiting. Requires emergency care and should not be seen or managed at an outpatient pain management facility. Communication becomes difficult and requires great effort. Assistance to reach the emergency department may be required. Facial flushing and profuse sweating along with potentially dangerous increases in heart rate and blood pressure will be evident.   Distressing pain 7 Self-care is very difficult. Assistance is required to transport, or use restroom. Assistance to reach the emergency department will be required. Tasks requiring coordination, such as bathing and getting dressed become very difficult.   Disabling pain 8 Self-care is no longer possible. At this level, pain is disabling. The individual is unable to do even the most "basic" activities such as walking, eating, bathing, dressing, transferring to a bed, or toileting. Fine motor skills are lost. It is difficult to think clearly.   Incapacitating pain 9 Pain becomes incapacitating. Thought processing is no longer possible. Difficult to remember your own name. Control of movement and coordination are lost.   The worst pain imaginable 10 At this level, most patients pass out from pain. When this level is reached, collapse of the autonomic nervous system occurs, leading to a sudden drop in blood pressure and heart rate. This in turn results in a temporary and dramatic drop in blood flow to the brain, leading to a loss of consciousness. Fainting is one of the body's self defense mechanisms. Passing out puts the brain in a calmed state and causes it to shut down for a while, in order to begin the healing process.    Summary: 1. Refer to this scale when providing [place] with your pain level. 2. Be accurate and careful when  reporting your pain level. This will help with your care. 3. Over-reporting your pain level will lead to loss of credibility. 4. Even a level of 1/10 means that there is pain and will be treated at our facility. 5. High, inaccurate reporting will be documented as "Symptom Exaggeration", leading to loss of credibility and suspicions of possible secondary gains such as obtaining more narcotics, or wanting to appear disabled, for fraudulent reasons. 6. Only pain levels of 5 or below will be seen at our facility. 7. Pain levels of 6 and above will be sent to the Emergency Department and the appointment cancelled. ______________________________________________________________________________________________   ____________________________________________________________________________________________  General Risks and Possible Complications  Patient Responsibilities: It is important that you read this as it is part of your informed consent. It is our duty to inform you of the risks and possible complications associated with treatments offered to you. It is your responsibility as a patient to read this and to ask questions about anything that is not clear or that you believe was not covered in this document.  Patient's Rights: You have the right to refuse treatment. You also have the right to change your mind, even after initially having agreed to have the treatment done. However, under this  last option, if you wait until the last second to change your mind, you may be charged for the materials used up to that point.  Introduction: Medicine is not an Chief Strategy Officer. Everything in Medicine, including the lack of treatment(s), carries the potential for danger, harm, or loss (which is by definition: Risk). In Medicine, a complication is a secondary problem, condition, or disease that can aggravate an already existing one. All treatments carry the risk of possible complications. The fact that a side effects  or complications occurs, does not imply that the treatment was conducted incorrectly. It must be clearly understood that these can happen even when everything is done following the highest safety standards.  No treatment: You can choose not to proceed with the proposed treatment alternative. The "PRO(s)" would include: avoiding the risk of complications associated with the therapy. The "CON(s)" would include: not getting any of the treatment benefits. These benefits fall under one of three categories: diagnostic; therapeutic; and/or palliative. Diagnostic benefits include: getting information which can ultimately lead to improvement of the disease or symptom(s). Therapeutic benefits are those associated with the successful treatment of the disease. Finally, palliative benefits are those related to the decrease of the primary symptoms, without necessarily curing the condition (example: decreasing the pain from a flare-up of a chronic condition, such as incurable terminal cancer).  General Risks and Complications: These are associated to most interventional treatments. They can occur alone, or in combination. They fall under one of the following six (6) categories: no benefit or worsening of symptoms; bleeding; infection; nerve damage; allergic reactions; and/or death. 1. No benefits or worsening of symptoms: In Medicine there are no guarantees, only probabilities. No healthcare provider can ever guarantee that a medical treatment will work, they can only state the probability that it may. Furthermore, there is always the possibility that the condition may worsen, either directly, or indirectly, as a consequence of the treatment. 2. Bleeding: This is more common if the patient is taking a blood thinner, either prescription or over the counter (example: Goody Powders, Fish oil, Aspirin, Garlic, etc.), or if suffering a condition associated with impaired coagulation (example: Hemophilia, cirrhosis of the liver,  low platelet counts, etc.). However, even if you do not have one on these, it can still happen. If you have any of these conditions, or take one of these drugs, make sure to notify your treating physician. 3. Infection: This is more common in patients with a compromised immune system, either due to disease (example: diabetes, cancer, human immunodeficiency virus [HIV], etc.), or due to medications or treatments (example: therapies used to treat cancer and rheumatological diseases). However, even if you do not have one on these, it can still happen. If you have any of these conditions, or take one of these drugs, make sure to notify your treating physician. 4. Nerve Damage: This is more common when the treatment is an invasive one, but it can also happen with the use of medications, such as those used in the treatment of cancer. The damage can occur to small secondary nerves, or to large primary ones, such as those in the spinal cord and brain. This damage may be temporary or permanent and it may lead to impairments that can range from temporary numbness to permanent paralysis and/or brain death. 5. Allergic Reactions: Any time a substance or material comes in contact with our body, there is the possibility of an allergic reaction. These can range from a mild skin rash (contact dermatitis) to  a severe systemic reaction (anaphylactic reaction), which can result in death. 6. Death: In general, any medical intervention can result in death, most of the time due to an unforeseen complication. ____________________________________________________________________________________________  ____________________________________________________________________________________________  Medication Rules  Purpose: To inform patients, and their family members, of our rules and regulations.  Applies to: All patients receiving prescriptions (written or electronic).  Pharmacy of record: Pharmacy where electronic  prescriptions will be sent. If written prescriptions are taken to a different pharmacy, please inform the nursing staff. The pharmacy listed in the electronic medical record should be the one where you would like electronic prescriptions to be sent.  Electronic prescriptions: In compliance with the [place] (STOP) Act of 2017 (Session Lanny Cramp [phone]), effective November 28, 2018, all controlled substances must be electronically prescribed. Calling prescriptions to the pharmacy will cease to exist.  Prescription refills: Only during scheduled appointments. Applies to all prescriptions.  NOTE: The following applies primarily to controlled substances (Opioid* Pain Medications).   Patient's responsibilities: 1. Pain Pills: Bring all pain pills to every appointment (except for procedure appointments). 2. Pill Bottles: Bring pills in original pharmacy bottle. Always bring the newest bottle. Bring bottle, even if empty. 3. Medication refills: You are responsible for knowing and keeping track of what medications you take and those you need refilled. The day before your appointment: write a list of all prescriptions that need to be refilled. The day of the appointment: give the list to the admitting nurse. Prescriptions will be written only during appointments. If you forget a medication: it will not be "Called in", "Faxed", or "electronically sent". You will need to get another appointment to get these prescribed. No early refills. Do not call asking to have your prescription filled early. 4. Prescription Accuracy: You are responsible for carefully inspecting your prescriptions before leaving our office. Have the discharge nurse carefully go over each prescription with you, before taking them home. Make sure that your name is accurately spelled, that your address is correct. Check the name and dose of your medication to make sure it is accurate. Check the number of  pills, and the written instructions to make sure they are clear and accurate. Make sure that you are given enough medication to last until your next medication refill appointment. 5. Taking Medication: Take medication as prescribed. When it comes to controlled substances, taking less pills or less frequently than prescribed is permitted and encouraged. Never take more pills than instructed. Never take medication more frequently than prescribed.  6. Inform other Doctors: Always inform, all of your healthcare providers, of all the medications you take. 7. Pain Medication from other Providers: You are not allowed to accept any additional pain medication from any other Doctor or Healthcare provider. There are two exceptions to this rule. (see below) In the event that you require additional pain medication, you are responsible for notifying us, as stated below. 8. Medication Agreement: You are responsible for carefully reading and following our Medication Agreement. This must be signed before receiving any prescriptions from our practice. Safely store a copy of your signed Agreement. Violations to the Agreement will result in no further prescriptions. (Additional copies of our Medication Agreement are available upon request.) 9. Laws, Rules, & Regulations: All patients are expected to follow all Federal and [employer], [employer], Rules, [employer]. Ignorance of the Laws does not constitute a valid excuse. The use of any illegal substances is prohibited. 10. Adopted CDC guidelines & recommendations: Target dosing levels  will be at or below 60 MME/day. Use of benzodiazepines** is not recommended.  Exceptions: There are only two exceptions to the rule of not receiving pain medications from other Healthcare Providers. 1. Exception #1 (Emergencies): In the event of an emergency (i.e.: accident requiring emergency care), you are allowed to receive additional pain medication. However, you are responsible for: As  soon as you are able, call our office [phone], at any time of the day or night, and leave a message stating your name, the date and nature of the emergency, and the name and dose of the medication prescribed. In the event that your call is answered by a member of our staff, make sure to document and save the date, time, and the name of the person that took your information.  2. Exception #2 (Planned Surgery): In the event that you are scheduled by another doctor or dentist to have any type of surgery or procedure, you are allowed (for a period no longer than 30 days), to receive additional pain medication, for the acute post-op pain. However, in this case, you are responsible for picking up a copy of our "Post-op Pain Management for Surgeons" handout, and giving it to your surgeon or dentist. This document is available at our office, and does not require an appointment to obtain it. Simply go to our office during business hours (Monday-Thursday from 8:00 AM to 4:00 PM) (Friday 8:00 AM to 12:00 Noon) or if you have a scheduled appointment with [place], prior to your surgery, and ask for it by name. In addition, you will need to provide [place] with your name, name of your surgeon, type of surgery, and date of procedure or surgery.  *Opioid medications include: morphine, codeine, oxycodone, oxymorphone, hydrocodone, hydromorphone, meperidine, tramadol, tapentadol, buprenorphine, fentanyl, methadone. **Benzodiazepine medications include: diazepam (Valium), alprazolam (Xanax), clonazepam (Klonopine), lorazepam (Ativan), clorazepate (Tranxene), chlordiazepoxide (Librium), estazolam (Prosom), oxazepam (Serax), temazepam (Restoril), triazolam (Halcion) (Last updated: 01/25/2018) ____________________________________________________________________________________________   ____________________________________________________________________________________________  Medication Recommendations and Reminders  Applies  to: All patients receiving prescriptions (written and/or electronic).  Medication Rules & Regulations: These rules and regulations exist for your safety and that of others. They are not flexible and neither are we. Dismissing or ignoring them will be considered "non-compliance" with medication therapy, resulting in complete and irreversible termination of such therapy. (See document titled "Medication Rules" for more details.) In all conscience, because of safety reasons, we cannot continue providing a therapy where the patient does not follow instructions.  Pharmacy of record:   Definition: This is the pharmacy where your electronic prescriptions will be sent.   We do not endorse any particular pharmacy.  You are not restricted in your choice of pharmacy.  The pharmacy listed in the electronic medical record should be the one where you want electronic prescriptions to be sent.  If you choose to change pharmacy, simply notify our nursing staff of your choice of new pharmacy.  Recommendations:  Keep all of your pain medications in a safe place, under lock and key, even if you live alone.   After you fill your prescription, take 1 week's worth of pills and put them away in a safe place. You should keep a separate, properly labeled bottle for this purpose. The remainder should be kept in the original bottle. Use this as your primary supply, until it runs out. Once it's gone, then you know that you have 1 week's worth of medicine, and it is time to come in for a prescription refill.  If you do this correctly, it is unlikely that you will ever run out of medicine.  To make sure that the above recommendation works, it is very important that you make sure your medication refill appointments are scheduled at least 1 week before you run out of medicine. To do this in an effective manner, make sure that you do not leave the office without scheduling your next medication management appointment. Always ask  the nursing staff to show you in your prescription , when your medication will be running out. Then arrange for the receptionist to get you a return appointment, at least 7 days before you run out of medicine. Do not wait until you have 1 or 2 pills left, to come in. This is very poor planning and does not take into consideration that we may need to cancel appointments due to bad weather, sickness, or emergencies affecting our staff.  "Partial Fill": If for any reason your pharmacy does not have enough pills/tablets to completely fill or refill your prescription, do not allow for a "partial fill". You will need a separate prescription to fill the remaining amount, which we will not provide. If the reason for the partial fill is your insurance, you will need to talk to the pharmacist about payment alternatives for the remaining tablets, but again, do not accept a partial fill.  Prescription refills and/or changes in medication(s):   Prescription refills, and/or changes in dose or medication, will be conducted only during scheduled medication management appointments. (Applies to both, written and electronic prescriptions.)  No refills on procedure days. No medication will be changed or started on procedure days. No changes, adjustments, and/or refills will be conducted on a procedure day. Doing so will interfere with the diagnostic portion of the procedure.  No phone refills. No medications will be "called into the pharmacy".  No Fax refills.  No weekend refills.  No Holliday refills.  No after hours refills.  Remember:  Business hours are:  Monday to Thursday 8:00 AM to 4:00 PM Provider's Schedule: Dionisio David, NP - Appointments are:  Medication management: Monday to Thursday 8:00 AM to 4:00 PM Milinda Pointer, MD - Appointments are:  Medication management: Monday and Wednesday 8:00 AM to 4:00 PM Procedure day: Tuesday and Thursday 7:30 AM to 4:00 PM Gillis Santa, MD - Appointments  are:  Medication management: Tuesday and Thursday 8:00 AM to 4:00 PM Procedure day: Monday and Wednesday 7:30 AM to 4:00 PM (Last update: 01/25/2018) ____________________________________________________________________________________________   ____________________________________________________________________________________________  CANNABIDIOL (AKA: CBD Oil or Pills)  Applies to: All patients receiving prescriptions of controlled substances (written and/or electronic).  General Information: Cannabidiol (CBD) was discovered in [birth year]. It is one of some 113 identified cannabinoids in cannabis (Marijuana) plants, accounting for up to 40% of the plant's extract. As of 2018, preliminary clinical research on cannabidiol included studies of anxiety, cognition, movement disorders, and pain.  Cannabidiol is consummed in multiple ways, including inhalation of cannabis smoke or vapor, as an aerosol spray into the cheek, and by mouth. It may be supplied as CBD oil containing CBD as the active ingredient (no added tetrahydrocannabinol (THC) or terpenes), a full-plant CBD-dominant hemp extract oil, capsules, dried cannabis, or as a liquid solution. CBD is thought not have the same psychoactivity as THC, and may affect the actions of THC. Studies suggest that CBD may interact with different biological targets, including cannabinoid receptors and other neurotransmitter receptors. As of 2018 the mechanism of action for its biological effects has not  been determined.  In the [place], cannabidiol has a limited approval by the Food and Drug Administration (FDA) for treatment of only two types of epilepsy disorders. The side effects of long-term use of the drug include somnolence, decreased appetite, diarrhea, fatigue, malaise, weakness, sleeping problems, and others.  CBD remains a Schedule I drug prohibited for any use.  Legality: Some manufacturers ship CBD products nationally, an illegal action which  the FDA has not enforced in 2018, with CBD remaining the subject of an FDA investigational new drug evaluation, and is not considered legal as a dietary supplement or food ingredient as of December 2018. Federal illegality has made it difficult historically to conduct research on CBD. CBD is openly sold in head shops and health food stores in some states where such sales have not been explicitly legalized.  Warning: Because it is not FDA approved for general use or treatment of pain, it is not required to undergo the same manufacturing controls as prescription drugs.  This means that the available cannabidiol (CBD) may be contaminated with THC.  If this is the case, it will trigger a positive urine drug screen (UDS) test for cannabinoids (Marijuana).  Because a positive UDS for illicit substances is a violation of our medication agreement, your opioid analgesics (pain medicine) may be permanently discontinued. (Last update: 02/15/2018) ____________________________________________________________________________________________   ____________________________________________________________________________________________  Blood Thinners  Recommended Time Interval Before and After Neuraxial Block or Catheter Removal  Drug (Generic) Brand Name Time Before Time After Comments  Abciximab Reopro 15 days 2 hours   Alteplase Activase 10 days 10 days   Apixaban Eliquis 3 days 6 hours   Aspirin > 325 mg Goody Powders/Excedrin 11 days  (Usually not stopped)  Aspirin ? 81 mg  7 days  (Usually not stopped)  Cholesterol Medication Lipitor 4 days    Cilostazol Pletal 3 days 5 hours   Clopidogrel Plavix 7-10 days 2 hours   Dabigatran Pradaxa 5 days 6 hours   Delteparin Fragmin 24 hours 4 hours   Dipyridamole + ASA Aggrenox 11days 2 hours   Enoxaparin  Lovenox 24 hours 4 hours   Eptifibatide Integrillin 8 hours 2 hours   Fish oil  4 days    Fondaparinux  Arixtra 72 hours 12 hours   Garlic supplements  7  days    Ginkgo biloba  36 hours    Ginseng  24 hours    Heparin (IV)  4 hours 2 hours   Heparin ([place])  12 hours 2 hours   Hydroxychloroquine Plaquenil 11 days    LMW Heparin  24 hours    LMWH  24 hours    NSAIDs  3 days  (Usually not stopped)  Prasugrel Effient 7-10 days 6 hours   Reteplase Retavase 10 days 10 days   Rivaroxaban Xarelto 3 days 6 hours   Streptokinase Streptase 10 days 10 days   Tenecteplase TNKase 10 days 10 days   Thrombolytics  10 days  10 days Avoid x 10 days after inj.  Ticagrelor Brilinta 5-7 days 6 hours   Ticlodipine Ticlid 10-14 days 2 hours   Tinzaparin Innohep 24 hours 4 hours   Tirofiban Aggrastat 8 hours 2 hours   Vitamin E  4 days    Warfarin Coumadin 5 days 2 hours   ____________________________________________________________________________________________

## 2018-12-20 LAB — TOXASSURE SELECT 13 (MW), URINE

## 2018-12-24 IMAGING — MR MR CERVICAL SPINE W/O CM
5 series · 38 of 48 positions shown · non-contrast
Comparison: Cervical spine CT 08/21/2017

CLINICAL DATA: Neck pain radiating into the arms with numbness and
tingling for several months. History of multiple sclerosis and
multiple falls.

EXAM:
MRI CERVICAL SPINE WITHOUT CONTRAST
TECHNIQUE: Multiplanar, multisequence MR imaging of the cervical spine was
performed. No intravenous contrast was administered.

[Series 5: T2 · sagittal · 3.0mm · 0.62mm/px · 8 of 17 slices shown (1 of 2)]
[im 1/17]
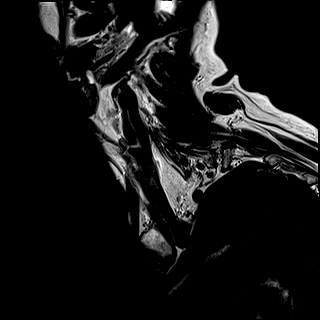
[im 3/17]
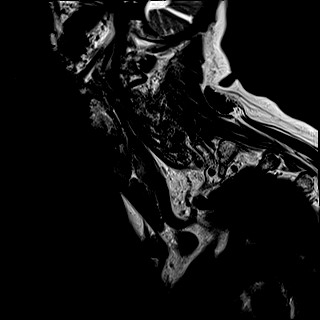
[im 5/17]
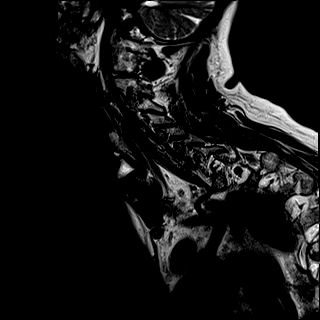
[im 7/17]
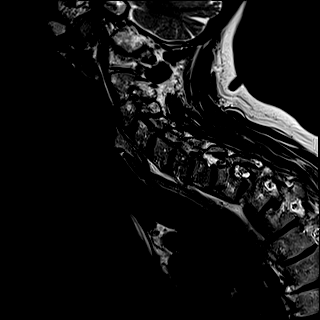
[im 10/17]
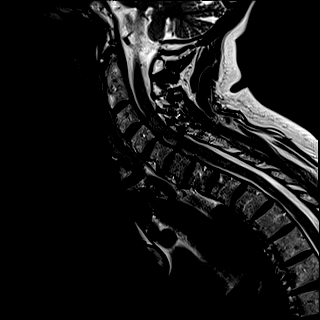
[im 12/17]
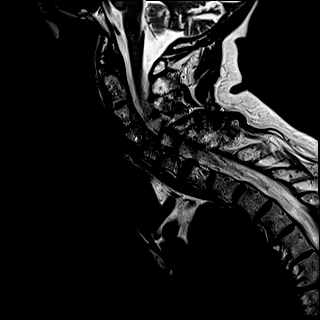
[im 14/17]
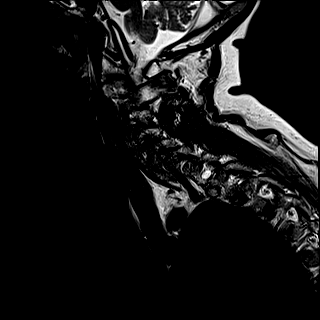
[im 17/17]
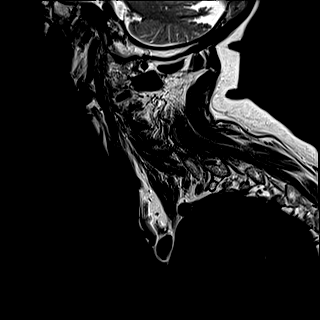

[Series 6: FLAIR · sagittal · 3.0mm · 0.78mm/px · 7 of 17 slices shown]
[im 1/17]
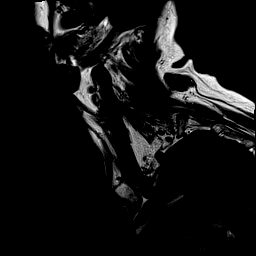
[im 3/17]
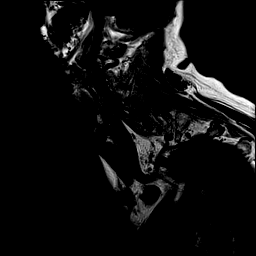
[im 6/17]
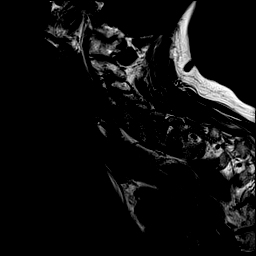
[im 9/17]
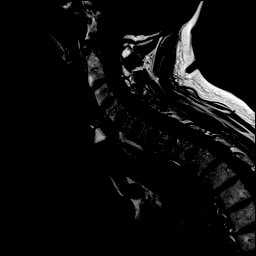
[im 11/17]
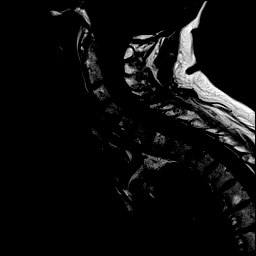
[im 14/17]
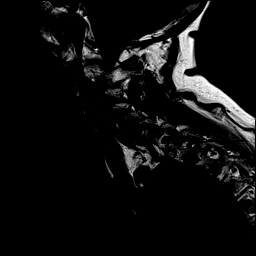
[im 17/17]
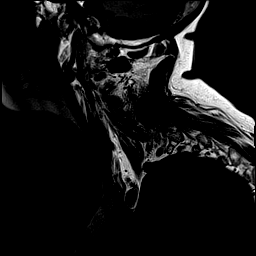

[Series 7: STIR · sagittal · 3.0mm · 0.62mm/px · 7 of 17 slices shown]
[im 1/17]
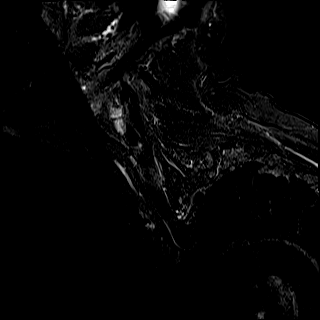
[im 3/17]
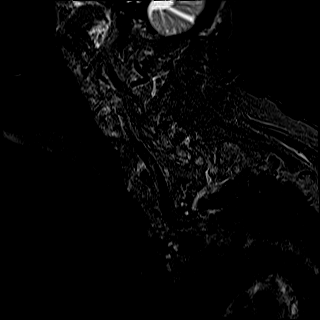
[im 6/17]
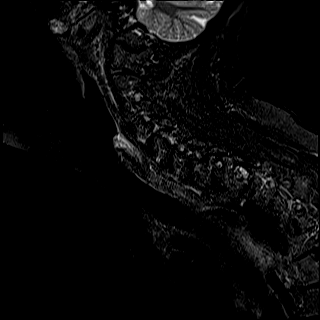
[im 9/17]
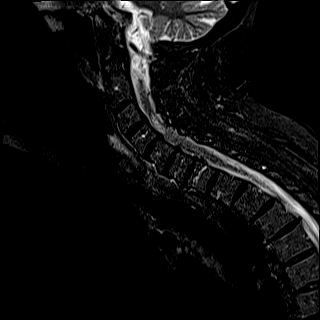
[im 11/17]
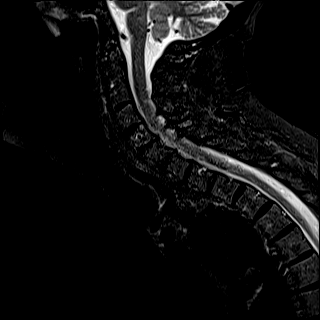
[im 14/17]
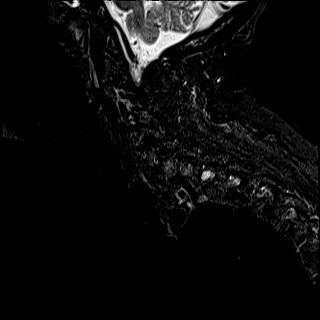
[im 17/17]
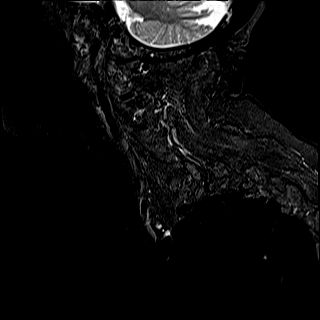

[Series 8: T2 · axial · 3.0mm · 0.70mm/px · z∈[-74,+8]mm · 9 of 29 slices shown (2 of 2)]
[im 1/29]
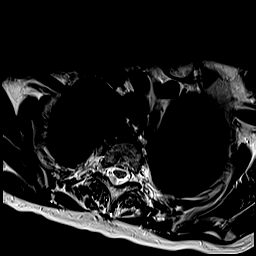
[im 5/29]
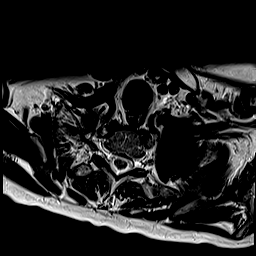
[im 10/29]
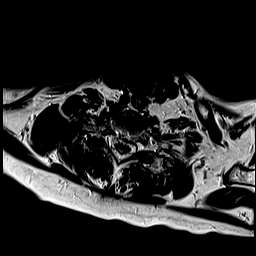
[im 12/29]
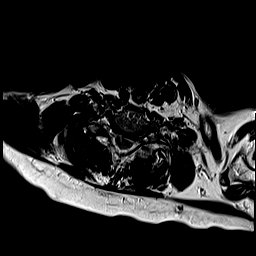
[im 15/29]
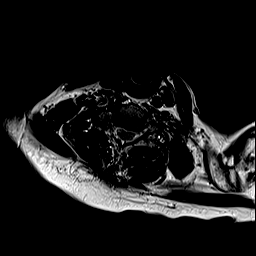
[im 17/29]
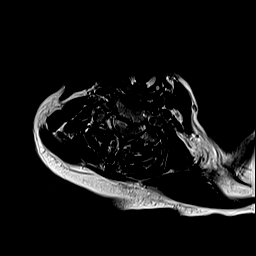
[im 19/29]
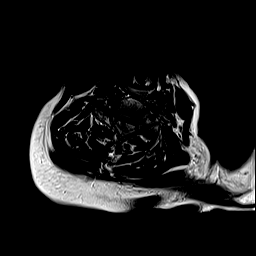
[im 24/29]
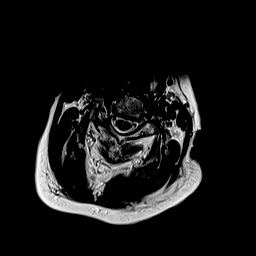
[im 29/29]
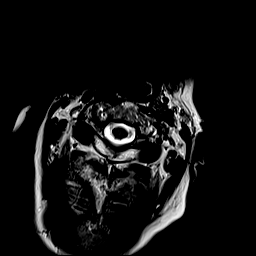

[Series 9: ax mpgr · axial · 3.0mm · 0.35mm/px · z∈[-74,-7]mm · 7 of 29 slices shown]
[im 1/29]
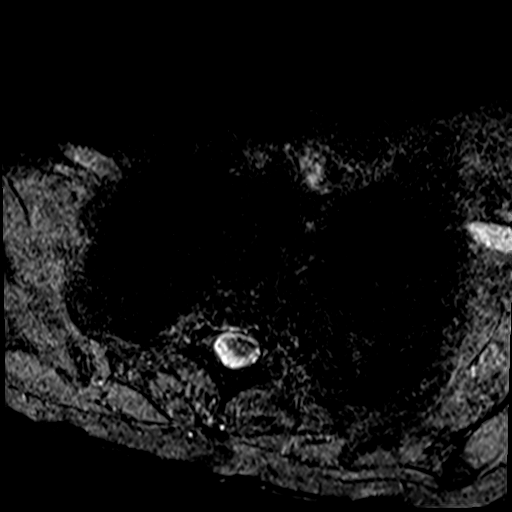
[im 5/29]
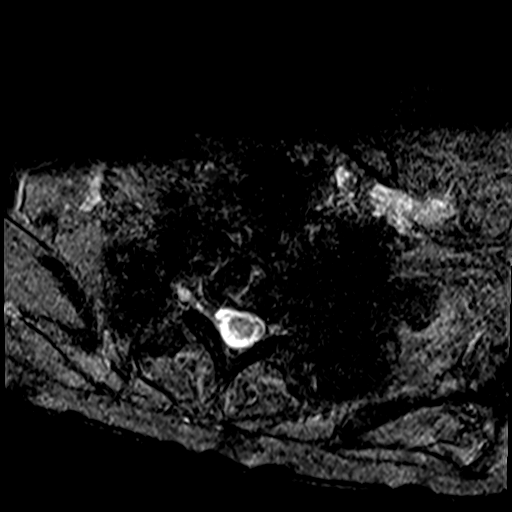
[im 10/29]
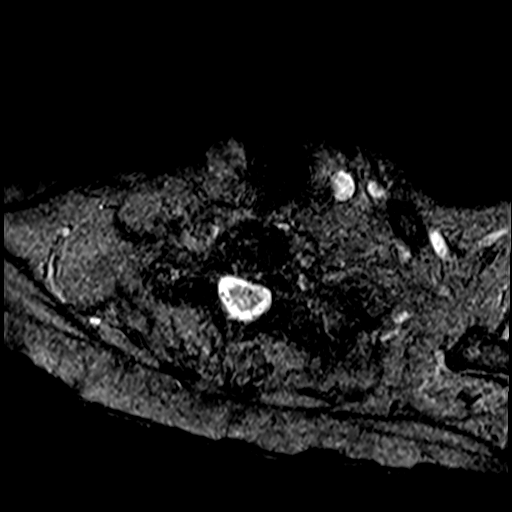
[im 12/29]
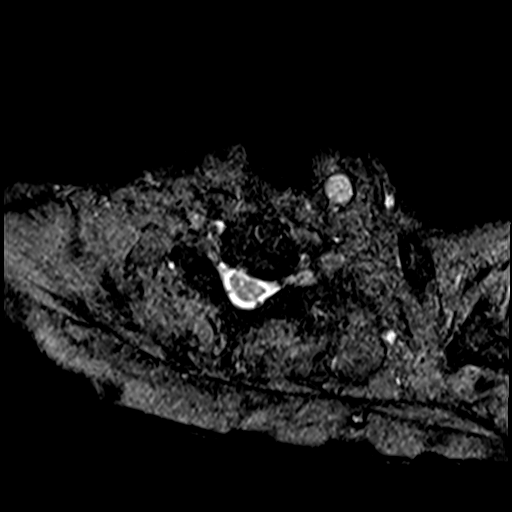
[im 17/29]
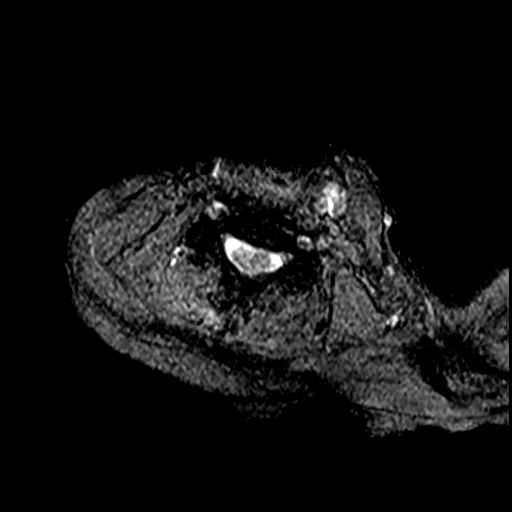
[im 19/29]
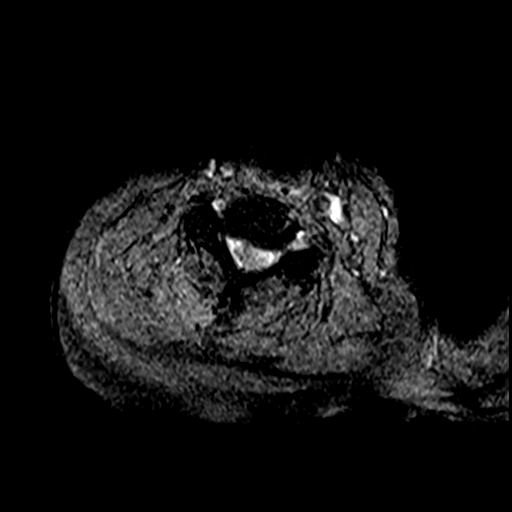
[im 24/29]
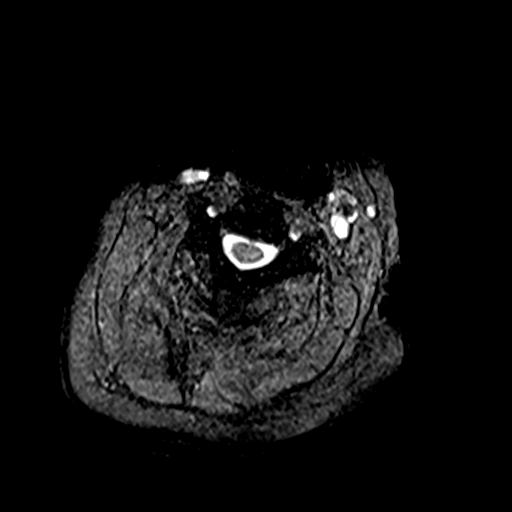

[38 of 48 positions shown; findings below may reference images not displayed]

FINDINGS: Alignment: Exaggerated upper thoracic kyphosis.  No listhesis.

Vertebrae: No fracture, suspicious osseous lesion, or significant
marrow edema.

Cord: T2 hyperintense lesions in the cord on the left at C4, on the
right at C5, and centrally/dorsally at C7-T1. Normal to mildly
decreased cord volume at these levels.

Posterior Fossa, vertebral arteries, paraspinal tissues: Partially
visualized T2 hyperintensities in the pons. Partially visualized
cerebellar atrophy and or old infarcts. Preserved vertebral artery
flow voids.

Disc levels:

C2-3: Minimal facet arthrosis without significant stenosis.

C3-4: Mild disc bulging, uncovertebral spurring, and mild facet
arthrosis result in mild spinal stenosis and mild bilateral neural
foraminal stenosis.

C4-5: Disc bulging, uncovertebral spurring, and mild facet arthrosis
result in mild spinal stenosis and moderate right and mild left
neural foraminal stenosis.

C5-6: Moderate disc space narrowing. Disc bulging, uncovertebral
spurring, and mild facet arthrosis result in mild spinal stenosis
and moderate to severe right and mild left neural foraminal
stenosis.

C6-7: Moderate disc space narrowing. Mild disc bulging and
uncovertebral spurring result in borderline left neural foraminal
stenosis without spinal stenosis.

C7-T1: Negative.
IMPRESSION: 1. Cervical disc degeneration most notable at C5-6 where there is
mild spinal stenosis and moderate to severe right neural foraminal
stenosis.
2. Mild spinal stenosis and mild-to-moderate neural foraminal
stenosis at C3-4 and C4-5.
3. Multiple cervical cord lesions consistent with the history of
multiple sclerosis.

## 2019-01-01 ENCOUNTER — Ambulatory Visit: Payer: Medicare Other | Admitting: Pain Medicine

## 2019-01-01 NOTE — Progress Notes (Deleted)
Patient's Name: Cynthia Price  MRN: [id number]  Referring Provider: [name], MD  DOB: [birth year]  PCP: [name], MD  DOS: 01/01/2019  Note by: Gaspar Cola, MD  Service setting: Ambulatory outpatient  Specialty: Interventional Pain Management  Patient type: Established  Location: [facility] (AMB) Pain Management Facility  Visit type: Interventional Procedure   Primary Reason for Visit: Interventional Pain Management Treatment. CC: No chief complaint on file.  Procedure:          Anesthesia, Analgesia, Anxiolysis:  Type: Diagnostic, Inter-Laminar, Cervical Epidural Steroid Injection  #1  Region: Posterior Cervico-thoracic Region Level: C7-T1 Laterality: Right-Sided Paramedial  Type: Local Anesthesia Indication(s): Analgesia         Route: Infiltration (/IM) IV Access: Declined Sedation: Declined  Local Anesthetic: Lidocaine 1-2%  Position: Prone with head of the table was raised to facilitate breathing.   Indications: 1. DDD (degenerative disc disease), cervical   2. Cervical central spinal stenosis   3. Cervical foraminal stenosis   4. Cervicalgia   5. Chronic upper extremity pain (Secondary Area of Pain) (Bilateral) (R>L)    Pain Score: Pre-procedure:  /10 Post-procedure:  /10  Pre-op Assessment:  Cynthia Price is a 73 y.o. (year old), female patient, seen today for interventional treatment. She  has a past surgical history that includes Appendectomy; Cholecystectomy; Lumbar laminectomy; Cholecystectomy; Tonsillectomy; Lumbar disc surgery; and Anterior lumbar fusion (2012). Cynthia Price has a current medication list which includes the following prescription(s): ampyra, baclofen, calcium carbonate, carbidopa-levodopa, vitamin d3, diclofenac sodium, escitalopram, gabapentin, hydroxyurea, levetiracetam, magnesium, metoclopramide, multivitamin, ondansetron, oxybutynin, oxycodone, oxycodone, oxycodone, ropinirole, tizanidine, and xarelto. Her primarily concern today is  the No chief complaint on file.  Initial Vital Signs:  Pulse/HCG Rate:    Temp:   Resp:   BP:   SpO2:    BMI: Estimated body mass index is 20.3 kg/m as calculated from the following:   Height as of 12/17/18: 5\' 2"  (1.575 m).   Weight as of 12/17/18: 111 lb (50.3 kg).  Risk Assessment: Allergies: Reviewed. She is allergic to atorvastatin.  Allergy Precautions: None required Coagulopathies: Reviewed. None identified.  Blood-thinner therapy: None at this time Active Infection(s): Reviewed. None identified. Cynthia Price is afebrile  Site Confirmation: Cynthia Price was asked to confirm the procedure and laterality before marking the site Procedure checklist: Completed Consent: Before the procedure and under the influence of no sedative(s), amnesic(s), or anxiolytics, the patient was informed of the treatment options, risks and possible complications. To fulfill our ethical and legal obligations, as recommended by the American Medical Association's Code of Ethics, I have informed the patient of my clinical impression; the nature and purpose of the treatment or procedure; the risks, benefits, and possible complications of the intervention; the alternatives, including doing nothing; the risk(s) and benefit(s) of the alternative treatment(s) or procedure(s); and the risk(s) and benefit(s) of doing nothing. The patient was provided information about the general risks and possible complications associated with the procedure. These may include, but are not limited to: failure to achieve desired goals, infection, bleeding, organ or nerve damage, allergic reactions, paralysis, and death. In addition, the patient was informed of those risks and complications associated to Spine-related procedures, such as failure to decrease pain; infection (i.e.: Meningitis, epidural or intraspinal abscess); bleeding (i.e.: epidural hematoma, subarachnoid hemorrhage, or any other type of intraspinal or peri-dural bleeding); organ  or nerve damage (i.e.: Any type of peripheral nerve, nerve root, or spinal cord injury) with subsequent damage to sensory,  motor, and/or autonomic systems, resulting in permanent pain, numbness, and/or weakness of one or several areas of the body; allergic reactions; (i.e.: anaphylactic reaction); and/or death. Furthermore, the patient was informed of those risks and complications associated with the medications. These include, but are not limited to: allergic reactions (i.e.: anaphylactic or anaphylactoid reaction(s)); adrenal axis suppression; blood sugar elevation that in diabetics may result in ketoacidosis or comma; water retention that in patients with history of congestive heart failure may result in shortness of breath, pulmonary edema, and decompensation with resultant heart failure; weight gain; swelling or edema; medication-induced neural toxicity; particulate matter embolism and blood vessel occlusion with resultant organ, and/or nervous system infarction; and/or aseptic necrosis of one or more joints. Finally, the patient was informed that Medicine is not an exact science; therefore, there is also the possibility of unforeseen or unpredictable risks and/or possible complications that may result in a catastrophic outcome. The patient indicated having understood very clearly. We have given the patient no guarantees and we have made no promises. Enough time was given to the patient to ask questions, all of which were answered to the patient's satisfaction. Cynthia Price has indicated that she wanted to continue with the procedure. Attestation: I, the ordering provider, attest that I have discussed with the patient the benefits, risks, side-effects, alternatives, likelihood of achieving goals, and potential problems during recovery for the procedure that I have provided informed consent. Date  Time: {CHL [facility]-PAIN TIME CHOICES:[id number]}  Pre-Procedure Preparation:  Monitoring: As per clinic protocol.  Respiration, ETCO2, SpO2, BP, heart rate and rhythm monitor placed and checked for adequate function Safety Precautions: Patient was assessed for positional comfort and pressure points before starting the procedure. Time-out: I initiated and conducted the "Time-out" before starting the procedure, as per protocol. The patient was asked to participate by confirming the accuracy of the "Time Out" information. Verification of the correct person, site, and procedure were performed and confirmed by me, the nursing staff, and the patient. "Time-out" conducted as per Joint Commission's Universal Protocol (UP.01.01.01). Time:    Description of Procedure:          Target Area: For Epidural Steroid injections the target is the interlaminar space, initially targeting the lower border of the superior vertebral body lamina. Approach: Paramedial approach. Area Prepped: Entire PosteriorCervical Region Prepping solution: ChloraPrep (2% chlorhexidine gluconate and 70% isopropyl alcohol) Safety Precautions: Aspiration looking for blood return was conducted prior to all injections. At no point did we inject any substances, as a needle was being advanced. No attempts were made at seeking any paresthesias. Safe injection practices and needle disposal techniques used. Medications properly checked for expiration dates. SDV (single dose vial) medications used. Description of the Procedure: Protocol guidelines were followed. The procedure needle was introduced through the skin, ipsilateral to the reported pain, and advanced to the target area. Bone was contacted and the needle walked caudad, until the lamina was cleared. The epidural space was identified using "loss-of-resistance technique" with 2-3 ml of PF-NaCl (0.9% NSS), in a 5cc LOR glass syringe. There were no vitals filed for this visit.  Start Time:   hrs. End Time:   hrs. Materials:  Needle(s) Type: Epidural needle Gauge: 17G Length: 3.5-in Medication(s): Please  see orders for medications and dosing details.  Imaging Guidance (Spinal):          Type of Imaging Technique: Fluoroscopy Guidance (Spinal) Indication(s): Assistance in needle guidance and placement for procedures requiring needle placement in or near specific anatomical locations not  easily accessible without such assistance. Exposure Time: Please see nurses notes. Contrast: Before injecting any contrast, we confirmed that the patient did not have an allergy to iodine, shellfish, or radiological contrast. Once satisfactory needle placement was completed at the desired level, radiological contrast was injected. Contrast injected under live fluoroscopy. No contrast complications. See chart for type and volume of contrast used. Fluoroscopic Guidance: I was personally present during the use of fluoroscopy. "Tunnel Vision Technique" used to obtain the best possible view of the target area. Parallax error corrected before commencing the procedure. "Direction-depth-direction" technique used to introduce the needle under continuous pulsed fluoroscopy. Once target was reached, antero-posterior, oblique, and lateral fluoroscopic projection used confirm needle placement in all planes. Images permanently stored in EMR. Interpretation: I personally interpreted the imaging intraoperatively. Adequate needle placement confirmed in multiple planes. Appropriate spread of contrast into desired area was observed. No evidence of afferent or efferent intravascular uptake. No intrathecal or subarachnoid spread observed. Permanent images saved into the patient's record.  Antibiotic Prophylaxis:   Anti-infectives (From admission, onward)   None     Indication(s): None identified  Post-operative Assessment:  Post-procedure Vital Signs:  Pulse/HCG Rate:    Temp:   Resp:   BP:   SpO2:    EBL: None  Complications: No immediate post-treatment complications observed by team, or reported by patient.  Note: The  patient tolerated the entire procedure well. A repeat set of vitals were taken after the procedure and the patient was kept under observation following institutional policy, for this type of procedure. Post-procedural neurological assessment was performed, showing return to baseline, prior to discharge. The patient was provided with post-procedure discharge instructions, including a section on how to identify potential problems. Should any problems arise concerning this procedure, the patient was given instructions to immediately contact us, at any time, without hesitation. In any case, we plan to contact the patient by telephone for a follow-up status report regarding this interventional procedure.  Comments:  No additional relevant information.  Plan of Care   Imaging Orders  No imaging studies ordered today   Procedure Orders    No procedure(s) ordered today    Medications ordered for procedure: No orders of the defined types were placed in this encounter.  Medications administered: Ketty Bitton. Heffler had no medications administered during this visit.  See the medical record for exact dosing, route, and time of administration.  Disposition: Discharge home  Discharge Date & Time: 01/01/2019;   hrs.   Physician-requested Follow-up: No follow-ups on file.  Future Appointments  Date Time Provider [place]  01/01/2019  1:45 PM Milinda Pointer, MD [facility] None  02/13/2019  3:00 PM Sater, Nanine Means, MD [facility] None   Primary Care Physician: [name], MD Location: [facility] Out[facility] Facility Note by: Gaspar Cola, MD Date: 01/01/2019; Time: 8:30 AM  Disclaimer:  Medicine is not an Chief Strategy Officer. The only guarantee in medicine is that nothing is guaranteed. It is important to note that the decision to proceed with this intervention was based on the information collected from the patient. The Data and conclusions were drawn from the patient's  questionnaire, the interview, and the physical examination. Because the information was provided in large part by the patient, it cannot be guaranteed that it has not been purposely or unconsciously manipulated. Every effort has been made to obtain as much relevant data as possible for this evaluation. It is important to note that the conclusions that lead to this  procedure are derived in large part from the available data. Always take into account that the treatment will also be dependent on availability of resources and existing treatment guidelines, considered by other Pain Management Practitioners as being common knowledge and practice, at the time of the intervention. For Medico-Legal purposes, it is also important to point out that variation in procedural techniques and pharmacological choices are the acceptable norm. The indications, contraindications, technique, and results of the above procedure should only be interpreted and judged by a Board-Certified Interventional Pain Specialist with extensive familiarity and expertise in the same exact procedure and technique.

## 2019-02-02 ENCOUNTER — Other Ambulatory Visit: Payer: Self-pay

## 2019-02-02 ENCOUNTER — Inpatient Hospital Stay
Admission: EM | Admit: 2019-02-02 | Discharge: 2019-02-04 | DRG: 059 | Disposition: A | Discharge status: Skilled Nursing Facility | Payer: Medicare Other | Attending: Internal Medicine | Admitting: Internal Medicine

## 2019-02-02 ENCOUNTER — Emergency Department: Payer: Medicare Other

## 2019-02-02 DIAGNOSIS — Z79891 Long term (current) use of opiate analgesic: Secondary | ICD-10-CM

## 2019-02-02 DIAGNOSIS — Z66 Do not resuscitate: Secondary | ICD-10-CM | POA: Diagnosis present

## 2019-02-02 DIAGNOSIS — D473 Essential (hemorrhagic) thrombocythemia: Secondary | ICD-10-CM | POA: Diagnosis present

## 2019-02-02 DIAGNOSIS — K589 Irritable bowel syndrome without diarrhea: Secondary | ICD-10-CM | POA: Diagnosis present

## 2019-02-02 DIAGNOSIS — Z7401 Bed confinement status: Secondary | ICD-10-CM

## 2019-02-02 DIAGNOSIS — R4182 Altered mental status, unspecified: Secondary | ICD-10-CM | POA: Diagnosis present

## 2019-02-02 DIAGNOSIS — H3530 Unspecified macular degeneration: Secondary | ICD-10-CM | POA: Diagnosis present

## 2019-02-02 DIAGNOSIS — R339 Retention of urine, unspecified: Secondary | ICD-10-CM | POA: Diagnosis present

## 2019-02-02 DIAGNOSIS — F329 Major depressive disorder, single episode, unspecified: Secondary | ICD-10-CM | POA: Diagnosis present

## 2019-02-02 DIAGNOSIS — Z79899 Other long term (current) drug therapy: Secondary | ICD-10-CM

## 2019-02-02 DIAGNOSIS — Z9049 Acquired absence of other specified parts of digestive tract: Secondary | ICD-10-CM

## 2019-02-02 DIAGNOSIS — G35 Multiple sclerosis: Secondary | ICD-10-CM | POA: Diagnosis not present

## 2019-02-02 DIAGNOSIS — Z86718 Personal history of other venous thrombosis and embolism: Secondary | ICD-10-CM

## 2019-02-02 DIAGNOSIS — E785 Hyperlipidemia, unspecified: Secondary | ICD-10-CM | POA: Diagnosis present

## 2019-02-02 DIAGNOSIS — I959 Hypotension, unspecified: Secondary | ICD-10-CM | POA: Diagnosis present

## 2019-02-02 DIAGNOSIS — D6862 Lupus anticoagulant syndrome: Secondary | ICD-10-CM | POA: Diagnosis present

## 2019-02-02 DIAGNOSIS — I110 Hypertensive heart disease with heart failure: Secondary | ICD-10-CM | POA: Diagnosis present

## 2019-02-02 DIAGNOSIS — Z981 Arthrodesis status: Secondary | ICD-10-CM

## 2019-02-02 DIAGNOSIS — H5462 Unqualified visual loss, left eye, normal vision right eye: Secondary | ICD-10-CM | POA: Diagnosis present

## 2019-02-02 DIAGNOSIS — I5032 Chronic diastolic (congestive) heart failure: Secondary | ICD-10-CM | POA: Diagnosis present

## 2019-02-02 DIAGNOSIS — Z7901 Long term (current) use of anticoagulants: Secondary | ICD-10-CM

## 2019-02-02 DIAGNOSIS — I2510 Atherosclerotic heart disease of native coronary artery without angina pectoris: Secondary | ICD-10-CM | POA: Diagnosis present

## 2019-02-02 DIAGNOSIS — I998 Other disorder of circulatory system: Secondary | ICD-10-CM | POA: Diagnosis present

## 2019-02-02 DIAGNOSIS — Z993 Dependence on wheelchair: Secondary | ICD-10-CM

## 2019-02-02 DIAGNOSIS — Z888 Allergy status to other drugs, medicaments and biological substances status: Secondary | ICD-10-CM

## 2019-02-02 DIAGNOSIS — G4700 Insomnia, unspecified: Secondary | ICD-10-CM | POA: Diagnosis present

## 2019-02-02 DIAGNOSIS — Z8249 Family history of ischemic heart disease and other diseases of the circulatory system: Secondary | ICD-10-CM

## 2019-02-02 DIAGNOSIS — Z820 Family history of epilepsy and other diseases of the nervous system: Secondary | ICD-10-CM

## 2019-02-02 DIAGNOSIS — I272 Pulmonary hypertension, unspecified: Secondary | ICD-10-CM | POA: Diagnosis present

## 2019-02-02 DIAGNOSIS — G894 Chronic pain syndrome: Secondary | ICD-10-CM | POA: Diagnosis present

## 2019-02-02 DIAGNOSIS — G2581 Restless legs syndrome: Secondary | ICD-10-CM | POA: Diagnosis present

## 2019-02-02 DIAGNOSIS — G629 Polyneuropathy, unspecified: Secondary | ICD-10-CM | POA: Diagnosis present

## 2019-02-02 DIAGNOSIS — N390 Urinary tract infection, site not specified: Secondary | ICD-10-CM | POA: Diagnosis present

## 2019-02-02 LAB — CBC
HCT: 32.5 % — ABNORMAL LOW (ref 36.0–46.0)
Hemoglobin: 9.8 g/dL — ABNORMAL LOW (ref 12.0–15.0)
MCH: 31 pg (ref 26.0–34.0)
MCHC: 30.2 g/dL (ref 30.0–36.0)
MCV: 102.8 fL — ABNORMAL HIGH (ref 80.0–100.0)
Platelets: 329 10*3/uL (ref 150–400)
RBC: 3.16 MIL/uL — ABNORMAL LOW (ref 3.87–5.11)
RDW: 14 % (ref 11.5–15.5)
WBC: 6 10*3/uL (ref 4.0–10.5)
nRBC: 0 % (ref 0.0–0.2)

## 2019-02-02 LAB — COMPREHENSIVE METABOLIC PANEL
ALK PHOS: 51 U/L (ref 38–126)
ALT: 10 U/L (ref 0–44)
AST: 18 U/L (ref 15–41)
Albumin: 3.2 g/dL — ABNORMAL LOW (ref 3.5–5.0)
Anion gap: 7 (ref 5–15)
BUN: 24 mg/dL — ABNORMAL HIGH (ref 8–23)
CALCIUM: 9.1 mg/dL (ref 8.9–10.3)
CO2: 31 mmol/L (ref 22–32)
Chloride: 105 mmol/L (ref 98–111)
Creatinine, Ser: 0.92 mg/dL (ref 0.44–1.00)
GFR calc Af Amer: >60 mL/min (ref >60)
GFR calc non Af Amer: >60 mL/min (ref >60)
Glucose, Bld: 115 mg/dL — ABNORMAL HIGH (ref 70–99)
Potassium: 4.3 mmol/L (ref 3.5–5.1)
SODIUM: 143 mmol/L (ref 135–145)
Total Bilirubin: 0.5 mg/dL (ref 0.3–1.2)
Total Protein: 6.5 g/dL (ref 6.5–8.1)

## 2019-02-02 LAB — URINALYSIS, COMPLETE (UACMP) WITH MICROSCOPIC
Bilirubin Urine: NEGATIVE
Glucose, UA: NEGATIVE mg/dL
Hgb urine dipstick: NEGATIVE
Ketones, ur: NEGATIVE mg/dL
Nitrite: NEGATIVE
Protein, ur: 30 mg/dL — AB
Specific Gravity, Urine: 1.019 (ref 1.005–1.030)
Squamous Epithelial / HPF: NONE SEEN (ref 0–5)
WBC, UA: >50 WBC/hpf — ABNORMAL HIGH (ref 0–5)
pH: 7 (ref 5.0–8.0)

## 2019-02-02 LAB — TSH: TSH: 6.072 u[IU]/mL — ABNORMAL HIGH (ref 0.350–4.500)

## 2019-02-02 MED ORDER — RIVAROXABAN 20 MG PO TABS
20.0000 mg | ORAL_TABLET | Freq: Every day | ORAL | Status: DC
  Administered 2019-02-03 – 2019-02-04 (×2): 20 mg via ORAL
  Filled 2019-02-02 (×2): qty 1

## 2019-02-02 MED ORDER — TRAZODONE HCL 50 MG PO TABS
50.0000 mg | ORAL_TABLET | Freq: Every day | ORAL | Status: DC
  Administered 2019-02-02 – 2019-02-03 (×2): 50 mg via ORAL
  Filled 2019-02-02 (×3): qty 1

## 2019-02-02 MED ORDER — ACETAMINOPHEN 650 MG RE SUPP
650.0000 mg | Freq: Four times a day (QID) | RECTAL | Status: DC | PRN
  Filled 2019-02-02: qty 1

## 2019-02-02 MED ORDER — MELATONIN 5 MG PO TABS
5.0000 mg | ORAL_TABLET | Freq: Every evening | ORAL | Status: DC | PRN
  Filled 2019-02-02: qty 1

## 2019-02-02 MED ORDER — OXYCODONE HCL 5 MG PO TABS: 5.0000 mg | ORAL_TABLET | Freq: Four times a day (QID) | ORAL | Status: DC | PRN

## 2019-02-02 MED ORDER — TIZANIDINE HCL 4 MG PO TABS
4.0000 mg | ORAL_TABLET | Freq: Three times a day (TID) | ORAL | Status: DC
  Administered 2019-02-02 – 2019-02-04 (×7): 4 mg via ORAL
  Filled 2019-02-02 (×9): qty 1

## 2019-02-02 MED ORDER — BACLOFEN 10 MG PO TABS
10.0000 mg | ORAL_TABLET | Freq: Four times a day (QID) | ORAL | Status: DC
  Administered 2019-02-02: 10 mg via ORAL
  Filled 2019-02-02 (×4): qty 1

## 2019-02-02 MED ORDER — SODIUM CHLORIDE 0.9 % IV BOLUS
1000.0000 mL | Freq: Once | INTRAVENOUS | Status: AC
  Administered 2019-02-02: 1000 mL via INTRAVENOUS

## 2019-02-02 MED ORDER — SODIUM CHLORIDE 0.9 % IV SOLN
1.0000 g | INTRAVENOUS | Status: DC
  Administered 2019-02-03 – 2019-02-04 (×2): 1 g via INTRAVENOUS
  Filled 2019-02-02: qty 10
  Filled 2019-02-02: qty 1
  Filled 2019-02-02: qty 10
  Filled 2019-02-02: qty 1

## 2019-02-02 MED ORDER — SODIUM CHLORIDE 0.9 % IV SOLN
1.0000 g | Freq: Once | INTRAVENOUS | Status: AC
  Administered 2019-02-02: 1 g via INTRAVENOUS
  Filled 2019-02-02: qty 10

## 2019-02-02 MED ORDER — ONDANSETRON HCL 4 MG PO TABS: 4.0000 mg | ORAL_TABLET | Freq: Four times a day (QID) | ORAL | Status: DC | PRN

## 2019-02-02 MED ORDER — ACETAMINOPHEN 325 MG PO TABS
650.0000 mg | ORAL_TABLET | Freq: Four times a day (QID) | ORAL | Status: DC | PRN
  Administered 2019-02-02 – 2019-02-03 (×3): 650 mg via ORAL
  Filled 2019-02-02 (×3): qty 2

## 2019-02-02 MED ORDER — GABAPENTIN 400 MG PO CAPS
800.0000 mg | ORAL_CAPSULE | Freq: Four times a day (QID) | ORAL | Status: DC
  Administered 2019-02-02 – 2019-02-04 (×8): 800 mg via ORAL
  Filled 2019-02-02 (×2): qty 2
  Filled 2019-02-02: qty 8
  Filled 2019-02-02 (×4): qty 2
  Filled 2019-02-02: qty 8
  Filled 2019-02-02 (×5): qty 2

## 2019-02-02 MED ORDER — SODIUM CHLORIDE 0.9 % IV SOLN: INTRAVENOUS | Status: DC

## 2019-02-02 MED ORDER — ESCITALOPRAM OXALATE 10 MG PO TABS
5.0000 mg | ORAL_TABLET | Freq: Every day | ORAL | Status: DC
  Administered 2019-02-02 – 2019-02-04 (×3): 5 mg via ORAL
  Filled 2019-02-02 (×3): qty 0.5

## 2019-02-02 MED ORDER — DALFAMPRIDINE ER 10 MG PO TB12: 10.0000 mg | ORAL_TABLET | Freq: Two times a day (BID) | ORAL | Status: DC

## 2019-02-02 MED ORDER — ONDANSETRON HCL 4 MG/2ML IJ SOLN: 4.0000 mg | Freq: Four times a day (QID) | INTRAMUSCULAR | Status: DC | PRN

## 2019-02-02 MED ORDER — METOCLOPRAMIDE HCL 5 MG PO TABS
5.0000 mg | ORAL_TABLET | Freq: Three times a day (TID) | ORAL | Status: DC
  Administered 2019-02-02 – 2019-02-04 (×9): 5 mg via ORAL
  Filled 2019-02-02 (×9): qty 1

## 2019-02-02 MED ORDER — ROPINIROLE HCL 1 MG PO TABS
2.0000 mg | ORAL_TABLET | Freq: Four times a day (QID) | ORAL | Status: DC
  Administered 2019-02-02 – 2019-02-04 (×8): 2 mg via ORAL
  Filled 2019-02-02 (×10): qty 2

## 2019-02-02 MED ORDER — SODIUM CHLORIDE 0.9 % IV SOLN
1000.0000 mg | Freq: Every day | INTRAVENOUS | Status: DC
  Administered 2019-02-03 – 2019-02-04 (×2): 1000 mg via INTRAVENOUS
  Filled 2019-02-02 (×4): qty 8

## 2019-02-02 MED ORDER — HYDROXYUREA 500 MG PO CAPS
500.0000 mg | ORAL_CAPSULE | ORAL | Status: DC
  Administered 2019-02-04: 10:00:00 500 mg via ORAL
  Filled 2019-02-02: qty 1

## 2019-02-02 MED ORDER — DOCUSATE SODIUM 100 MG PO CAPS
100.0000 mg | ORAL_CAPSULE | Freq: Two times a day (BID) | ORAL | Status: DC
  Administered 2019-02-02 – 2019-02-04 (×5): 100 mg via ORAL
  Filled 2019-02-02 (×5): qty 1

## 2019-02-02 MED ORDER — DEXAMETHASONE SODIUM PHOSPHATE 10 MG/ML IJ SOLN
10.0000 mg | Freq: Once | INTRAMUSCULAR | Status: AC
  Administered 2019-02-02: 10 mg via INTRAVENOUS
  Filled 2019-02-02: qty 1

## 2019-02-02 MED ORDER — BACLOFEN 10 MG PO TABS
20.0000 mg | ORAL_TABLET | Freq: Four times a day (QID) | ORAL | Status: DC
  Administered 2019-02-02 – 2019-02-03 (×6): 20 mg via ORAL
  Filled 2019-02-02 (×7): qty 2

## 2019-02-02 MED ORDER — OXYCODONE HCL 5 MG PO TABS
5.0000 mg | ORAL_TABLET | Freq: Four times a day (QID) | ORAL | Status: DC | PRN
  Administered 2019-02-02 – 2019-02-03 (×3): 5 mg via ORAL
  Filled 2019-02-02 (×3): qty 1

## 2019-02-02 NOTE — Consult Note (Signed)
Reason for Consult: MS with vision loss Referring Physician: Dr. Shirline Frees is an 73 y.o. female.  HPI: Patient was diagnosed with MS at age 13.  She says that she was never on any immunomodulatory treatment for some reason.  Up until a year ago she could walk with a walker, but has been essentially wheelchair bound since then.  She noticed more difficulty seeing with the left eye about a week ago.  There was no eye pain or pain with eye movement.  She has known macular degeneration and is getting Avastin injections.  CT Brain was negative.  I reviewed her MRI Brain from 2008 which showed supratentorial and infratentorial demyelinating lesions.  CBC is normal except for Hg 9.8 and CMP is normal.  TSH is elevated at 6.072.  U/A shows >50 WBC.    Past Medical History:  Diagnosis Date  . Anemia 2012   from labs at [facility]  . Back pain    s/p hemilaminectomy with h/o herniated disc at L4L5  . Blood clot in vein    behind left knee  . BP (high blood pressure) 03/08/2014   Last Assessment & Plan:  Blood pressure has been controlled without significant dizzyness or associated fatigue. Taking antihypertensives as directed without difficulty.    Marland Kitchen CAD (coronary artery disease) 10/18/2011  . Chronic diastolic heart failure ([place]) 09/07/2014   Last Assessment & Plan:  Edema and sob seemingly baseline   . Collagen vascular disease ([place])   . Depression    after husband died  . DVT (deep venous thrombosis) ([place]) 05/14/2012  . Essential thrombocytosis ([place])   . Hyperlipidemia   . 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[place]) 03/05/2015  . Hypotension 03/26/2013  . IBS (irritable bowel syndrome)   . Insomnia   . Lupus anticoagulant positive   . Macular degeneration   . MS (multiple sclerosis) ([place])   . Multiple sclerosis ([place]) 04/29/2008   Per neurology at [facility]    . NEUROPATHY 04/29/2008   Per Neurology at [facility]    . RLS (restless legs syndrome)   . Spinal stenosis    lumbar  . Urinary  retention    with high post void residuals 2013, per Mallie Mussel    Past Surgical History:  Procedure Laterality Date  . ANTERIOR LUMBAR FUSION  2012  . APPENDECTOMY    . CHOLECYSTECTOMY    . CHOLECYSTECTOMY    . LUMBAR DISC SURGERY    . LUMBAR LAMINECTOMY    . TONSILLECTOMY      Family History  Problem Relation Age of Onset  . Heart disease Mother   . Thyroid cancer Mother   . Ovarian cancer Mother   . Alcohol abuse Father   . Heart disease Father   . Diabetes Sister   . Diabetes Brother   . Multiple sclerosis Paternal Aunt   . Multiple sclerosis Cousin     Social History:  reports that she has never smoked. She has never used smokeless tobacco. She reports that she does not drink alcohol or use drugs.  Allergies:  Allergies  Allergen Reactions  . Atorvastatin     Muscle aches at >40mg      Prior to Admission medications   Medication Sig Start Date End Date Taking? Authorizing Provider  AMPYRA 10 MG TB12 Take 10 mg by mouth 2 (two) times daily.  09/11/12  Yes [provider]  baclofen (LIORESAL) 10 MG tablet Take 1 tablet (10 mg total) by mouth 4 (four)  times daily. 12/17/18 03/17/19 Yes Milinda Pointer, MD  calcium carbonate (CALCIUM 600) 600 MG TABS tablet Take 1 tablet (600 mg total) by mouth 2 (two) times daily with a meal. 12/17/18 03/17/19 Yes Milinda Pointer, MD  Cholecalciferol (VITAMIN D3) 125 MCG (5000 UT) CAPS Take 1 capsule (5,000 Units total) by mouth daily with breakfast. Take along with calcium and magnesium. 12/17/18 03/17/19 Yes Milinda Pointer, MD  diclofenac sodium (VOLTAREN) 1 % GEL Apply 4 g topically 3 (three) times daily.    Yes [provider]  escitalopram (LEXAPRO) 5 MG tablet Take 5 mg by mouth daily.  04/23/18  Yes [provider]  gabapentin (NEURONTIN) 800 MG tablet Take 1 tablet (800 mg total) by mouth 4 (four) times daily. 12/17/18 03/17/19 Yes Milinda Pointer, MD  guaifenesin (ROBITUSSIN) 100 MG/5ML syrup Take  200 mg by mouth every 4 (four) hours as needed for cough.   Yes [provider]  hydroxyurea (HYDREA) 500 MG capsule Take one by mouth Monday, Tuesday, Wed, Thursday, Friday Patient taking differently: Take 500 mg by mouth every Monday, Tuesday, Wednesday, Thursday, and Friday.  05/10/13  Yes Tonia Ghent, MD  Magnesium 500 MG CAPS Take 1 capsule (500 mg total) by mouth 2 (two) times daily at 8 am and 10 pm. 12/17/18 03/17/19 Yes Milinda Pointer, MD  Melatonin 3 MG TABS Take 6 mg by mouth at bedtime as needed (insomnia).   Yes [provider]  metoCLOPramide (REGLAN) 5 MG tablet Take 5 mg by mouth 4 (four) times daily -  before meals and at bedtime.    Yes [provider]  Multiple Vitamin (MULTIVITAMIN) tablet Take 1 tablet by mouth daily.   Yes [provider]  ondansetron (ZOFRAN) 4 MG tablet Take 4 mg by mouth every 4 (four) hours as needed for nausea or vomiting.  07/24/18  Yes [provider]  oxyCODONE (OXY IR/ROXICODONE) 5 MG immediate release tablet Take 1 tablet (5 mg total) by mouth every 6 (six) hours as needed for up to 30 days for severe pain. Must last 30 days. Patient taking differently: Take 5 mg by mouth every 6 (six) hours. Must last 30 days. 01/16/19 02/15/19 Yes Milinda Pointer, MD  rOPINIRole (REQUIP) 2 MG tablet Take 2 mg by mouth 4 (four) times daily.   Yes [provider]  tiZANidine (ZANAFLEX) 4 MG tablet Take 1 tablet (4 mg total) by mouth every 8 (eight) hours as needed for muscle spasms. Patient taking differently: Take 4 mg by mouth 3 (three) times daily.  12/17/18 03/17/19 Yes Milinda Pointer, MD  traZODone (DESYREL) 50 MG tablet Take 50 mg by mouth at bedtime. 12/24/18  Yes [provider]  XARELTO 20 MG TABS tablet Take 20 mg by mouth daily with breakfast.  09/07/15  Yes [provider]  oxyCODONE (OXY IR/ROXICODONE) 5 MG immediate release tablet Take 1 tablet (5 mg total) by mouth every 6  (six) hours as needed for up to 30 days for severe pain. Must Last 30 days. 02/15/19 03/17/19  Milinda Pointer, MD  oxyCODONE (OXY IR/ROXICODONE) 5 MG immediate release tablet Take 1 tablet (5 mg total) by mouth every 6 (six) hours as needed for up to 30 days for severe pain. Must last 30 days. 12/17/18 01/16/19  Milinda Pointer, MD    Medications:  Scheduled: . baclofen  10 mg Oral QID  . dalfampridine  10 mg Oral BID  . docusate sodium  100 mg Oral BID  . escitalopram  5 mg Oral Daily  . gabapentin  800 mg Oral QID  . [START ON 02/04/2019] hydroxyurea  500 mg Oral Q MTWThF  . metoCLOPramide  5 mg Oral TID AC & HS  . [START ON 02/03/2019] rivaroxaban  20 mg Oral Q breakfast  . rOPINIRole  2 mg Oral QID  . tiZANidine  4 mg Oral TID  . traZODone  50 mg Oral QHS    Results for orders placed or performed during the hospital encounter of 02/02/19 (from the past 48 hour(s))  CBC     Status: Abnormal   Collection Time: 02/02/19  1:21 AM  Result Value Ref Range   WBC 6.0 4.0 - 10.5 K/uL   RBC 3.16 (L) 3.87 - 5.11 MIL/uL   Hemoglobin 9.8 (L) 12.0 - 15.0 g/dL   HCT 32.5 (L) 36.0 - 46.0 %   MCV 102.8 (H) 80.0 - 100.0 fL   MCH 31.0 26.0 - 34.0 pg   MCHC 30.2 30.0 - 36.0 g/dL   RDW 14.0 11.5 - 15.5 %   Platelets 329 150 - 400 K/uL   nRBC 0.0 0.0 - 0.2 %    Comment: Performed at [facility], [address] [place], [place] [address]  Comprehensive metabolic panel     Status: Abnormal   Collection Time: 02/02/19  1:21 AM  Result Value Ref Range   Sodium 143 135 - 145 mmol/L   Potassium 4.3 3.5 - 5.1 mmol/L   Chloride 105 98 - 111 mmol/L   CO2 31 22 - 32 mmol/L   Glucose, Bld 115 (H) 70 - 99 mg/dL   BUN 24 (H) 8 - 23 mg/dL   Creatinine, Ser 0.92 0.44 - 1.00 mg/dL   Calcium 9.1 8.9 - 10.3 mg/dL   Total Protein 6.5 6.5 - 8.1 g/dL   Albumin 3.2 (L) 3.5 - 5.0 g/dL   AST 18 15 - 41 U/L   ALT 10 0 - 44 U/L   Alkaline Phosphatase 51 38 - 126 U/L   Total Bilirubin 0.5 0.3 - 1.2  mg/dL   GFR calc non Af Amer >60 >60 mL/min   GFR calc Af Amer >60 >60 mL/min   Anion gap 7 5 - 15    Comment: Performed at [facility], [address] [place], [place] [address]  TSH     Status: Abnormal   Collection Time: 02/02/19  1:21 AM  Result Value Ref Range   TSH 6.072 (H) 0.350 - 4.500 uIU/mL    Comment: Performed by a 3rd Generation assay with a functional sensitivity of <=0.01 uIU/mL. Performed at [facility], [address] [place], [place] [address]   Urinalysis, Complete w Microscopic     Status: Abnormal   Collection Time: 02/02/19  1:24 AM  Result Value Ref Range   Color, Urine YELLOW (A) YELLOW   APPearance TURBID (A) CLEAR   Specific Gravity, Urine 1.019 1.005 - 1.030   pH 7.0 5.0 - 8.0   Glucose, UA NEGATIVE NEGATIVE mg/dL   Hgb urine dipstick NEGATIVE NEGATIVE   Bilirubin Urine NEGATIVE NEGATIVE   Ketones, ur NEGATIVE NEGATIVE mg/dL   Protein, ur 30 (A) NEGATIVE mg/dL   Nitrite NEGATIVE NEGATIVE   Leukocytes,Ua LARGE (A) NEGATIVE   RBC / HPF 21-50 0 - 5 RBC/hpf   WBC, UA >50 (H) 0 - 5 WBC/hpf   Bacteria, UA RARE (A) NONE SEEN   Squamous Epithelial / LPF NONE SEEN 0 - 5   Mucus  PRESENT    Triple Phosphate Crystal PRESENT     Comment: Performed at [facility], [address] [place], [place] [address]    Ct Head Wo Contrast  Result Date: 02/02/2019 CLINICAL DATA:  Transient ischemic attack. New onset of visual loss in the left eye. EXAM: CT HEAD WITHOUT CONTRAST TECHNIQUE: Contiguous axial images were obtained from the base of the skull through the vertex without intravenous contrast. COMPARISON:  None. FINDINGS: BRAIN: There is mild sulcal and ventricular prominence consistent with superficial and central atrophy. No intraparenchymal hemorrhage, mass effect nor midline shift. Periventricular and subcortical white matter hypodensities consistent with chronic small vessel ischemic disease are identified. No acute large vascular  territory infarcts. No abnormal extra-axial fluid collections. Basal cisterns are not effaced and midline. Brainstem and cerebellum are intact and nonacute. VASCULAR: Moderate calcific atherosclerosis of the carotid siphons. SKULL: No skull fracture. No significant scalp soft tissue swelling. SINUSES/ORBITS: The mastoid air-cells are clear. The included paranasal sinuses are well-aerated. Polypoid mucosal thickening along the periphery of the left maxillary sinus is noted. Mild membrane thickening of the ethmoid sinus. Slight sphenoid sinus mucosal thickening is seen bilaterally.The included ocular globes and orbital contents are non-suspicious. No acute retrobulbar abnormality is seen. OTHER: None. IMPRESSION: 1. Atrophy with chronic appearing small vessel ischemia. No acute intracranial abnormality. 2. Mild-to-moderate paranasal sinus mucosal thickening. 3. Intact orbits and globes. Symmetric appearance of the extraocular muscles and optic nerves. Electronically Signed   By: Ashley Royalty M.D.   On: 02/02/2019 02:07    ROS Blood pressure (!) 107/54, pulse 72, temperature 98.5 F (36.9 C), temperature source Oral, resp. rate 16, height 5\' 2"  (1.575 m), weight 47.6 kg, SpO2 94 %. Neurologic Examination:  Awake, alert, fully oriented. Language - fluent. Comprehension, naming, repetition - intact. PERL.  EOMI.  Visual fields are intact.   Face symmetrical.  Tongue midline. Severe spasticity all 4 extremities.   Strength 2/5 BUE and BLE at best. Sensory - numbness bilaterally. Coord - unable to do. Gait- unable to get out of bed. Hyperreflexic.  Assessment/Plan:  Patient with a 35 year history of Multiple Sclerosis without any immunomodulatory treatment during that time period.  She is severely disabled and non-ambulatory.  Current visual presentation is not consistent with optic neuritis.  She may have worsening macular degeneration or other intrinsic eye issue which an ophthalmologist can assess.   You may continue the IV solumedrol for 3 days total only, but not likely to change her condition.  She is on Ampyra but that is meant to be used in someone who is ambulatory only.  I will discontinue that.    She has severe spasticity and pain as a result.  I will increase her Baclofen dose.  She is tolerating it well without any sedation.    She has a UTI which may cause decompensation of prior MS deficits as well, including any prior vision changes that I am not aware of.    [name], MS, MD 02/02/2019, 2:26 PM

## 2019-02-02 NOTE — Care Management Obs Status (Signed)
[facility] NOTIFICATION   Patient Details  Name: Cynthia Price MRN: [id number] Date of Birth: 1946/04/08   Medicare Observation Status Notification Given:  Yes    Johnnie Goynes A Micayla Brathwaite, RN 02/02/2019, 9:24 AM

## 2019-02-02 NOTE — ED Triage Notes (Signed)
Pt reports new onset of loss of vision in L EYE. Pt reports unable to move L leg to EMS. Upon arrival, pt reports able to see out of L eye, but not as well as prior to vision loss. Pt reports being unable to move LLE for a "long time".

## 2019-02-02 NOTE — ED Notes (Signed)
ED TO INPATIENT HANDOFF REPORT  ED Nurse Name and Phone #: Eliezer Lofts [phone]  S Name/Age/Gender Cynthia Price 73 y.o. female Room/Bed: ED12A/ED12A  Code Status   Code Status: Prior  Home/SNF/Other Nursing Home Patient oriented to: self, place, time and situation Is this baseline? Yes   Triage Complete: Triage complete  Chief Complaint Unable to move leg  Triage Note Pt reports new onset of loss of vision in L EYE. Pt reports unable to move L leg to EMS. Upon arrival, pt reports able to see out of L eye, but not as well as prior to vision loss. Pt reports being unable to move LLE for a "long time".   Allergies Allergies  Allergen Reactions  . Atorvastatin     Muscle aches at >40mg      Level of Care/Admitting Diagnosis ED Disposition    ED Disposition Condition [facility]: [place] [[id number]]  Level of Care: Med-Surg [16]  Diagnosis: Multiple sclerosis ([place]) [340.ICD-9-CM]  Admitting Physician: [name] [[id number]]  Attending Physician: [name] [[id number]]  PT Class (Do Not Modify): Observation [104]  PT Acc Code (Do Not Modify): Observation [10022]       B Medical/Surgery History Past Medical History:  Diagnosis Date  . Anemia 2012   from labs at [facility]  . Back pain    s/p hemilaminectomy with h/o herniated disc at L4L5  . Blood clot in vein    behind left knee  . BP (high blood pressure) 03/08/2014   Last Assessment & Plan:  Blood pressure has been controlled without significant dizzyness or associated fatigue. Taking antihypertensives as directed without difficulty.    Marland Kitchen CAD (coronary artery disease) 10/18/2011  . Chronic diastolic heart failure ([place]) 09/07/2014   Last Assessment & Plan:  Edema and sob seemingly baseline   . Collagen vascular disease ([place])   . Depression    after husband died  . DVT (deep venous thrombosis) ([place]) 05/14/2012  . Essential thrombocytosis ([place])   . Hyperlipidemia   .  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[place]) 03/05/2015  . Hypotension 03/26/2013  . IBS (irritable bowel syndrome)   . Insomnia   . Lupus anticoagulant positive   . Macular degeneration   . MS (multiple sclerosis) ([place])   . Multiple sclerosis ([place]) 04/29/2008   Per neurology at [facility]    . NEUROPATHY 04/29/2008   Per Neurology at [facility]    . RLS (restless legs syndrome)   . Spinal stenosis    lumbar  . Urinary retention    with high post void residuals 2013, per Mallie Mussel   Past Surgical History:  Procedure Laterality Date  . ANTERIOR LUMBAR FUSION  2012  . APPENDECTOMY    . CHOLECYSTECTOMY    . CHOLECYSTECTOMY    . LUMBAR DISC SURGERY    . LUMBAR LAMINECTOMY    . TONSILLECTOMY       A IV Location/Drains/Wounds Patient Lines/Drains/Airways Status   Active Line/Drains/Airways    Name:   Placement date:   Placement time:   Site:   Days:   Peripheral IV 02/02/19 Right Forearm   02/02/19    0115    Forearm   less than 1   Urethral Catheter unknown Straight-tip   02/02/19    0244    Straight-tip   less than 1          Intake/Output Last 24 hours No intake or output data in the 24 hours ending  02/02/19 0403  Labs/Imaging Results for orders placed or performed during the hospital encounter of 02/02/19 (from the past 48 hour(s))  CBC     Status: Abnormal   Collection Time: 02/02/19  1:21 AM  Result Value Ref Range   WBC 6.0 4.0 - 10.5 K/uL   RBC 3.16 (L) 3.87 - 5.11 MIL/uL   Hemoglobin 9.8 (L) 12.0 - 15.0 g/dL   HCT 32.5 (L) 36.0 - 46.0 %   MCV 102.8 (H) 80.0 - 100.0 fL   MCH 31.0 26.0 - 34.0 pg   MCHC 30.2 30.0 - 36.0 g/dL   RDW 14.0 11.5 - 15.5 %   Platelets 329 150 - 400 K/uL   nRBC 0.0 0.0 - 0.2 %    Comment: Performed at [facility], [address] [place], [place] [address]  Comprehensive metabolic panel     Status: Abnormal   Collection Time: 02/02/19  1:21 AM  Result Value Ref Range   Sodium 143 135 - 145 mmol/L   Potassium 4.3 3.5 - 5.1 mmol/L    Chloride 105 98 - 111 mmol/L   CO2 31 22 - 32 mmol/L   Glucose, Bld 115 (H) 70 - 99 mg/dL   BUN 24 (H) 8 - 23 mg/dL   Creatinine, Ser 0.92 0.44 - 1.00 mg/dL   Calcium 9.1 8.9 - 10.3 mg/dL   Total Protein 6.5 6.5 - 8.1 g/dL   Albumin 3.2 (L) 3.5 - 5.0 g/dL   AST 18 15 - 41 U/L   ALT 10 0 - 44 U/L   Alkaline Phosphatase 51 38 - 126 U/L   Total Bilirubin 0.5 0.3 - 1.2 mg/dL   GFR calc non Af Amer >60 >60 mL/min   GFR calc Af Amer >60 >60 mL/min   Anion gap 7 5 - 15    Comment: Performed at [facility], [address] [place], [place] [address]  Urinalysis, Complete w Microscopic     Status: Abnormal   Collection Time: 02/02/19  1:24 AM  Result Value Ref Range   Color, Urine YELLOW (A) YELLOW   APPearance TURBID (A) CLEAR   Specific Gravity, Urine 1.019 1.005 - 1.030   pH 7.0 5.0 - 8.0   Glucose, UA NEGATIVE NEGATIVE mg/dL   Hgb urine dipstick NEGATIVE NEGATIVE   Bilirubin Urine NEGATIVE NEGATIVE   Ketones, ur NEGATIVE NEGATIVE mg/dL   Protein, ur 30 (A) NEGATIVE mg/dL   Nitrite NEGATIVE NEGATIVE   Leukocytes,Ua LARGE (A) NEGATIVE   RBC / HPF 21-50 0 - 5 RBC/hpf   WBC, UA >50 (H) 0 - 5 WBC/hpf   Bacteria, UA RARE (A) NONE SEEN   Squamous Epithelial / LPF NONE SEEN 0 - 5   Mucus PRESENT    Triple Phosphate Crystal PRESENT     Comment: Performed at [facility], [address], [place], [place] [address]   Ct Head Wo Contrast  Result Date: 02/02/2019 CLINICAL DATA:  Transient ischemic attack. New onset of visual loss in the left eye. EXAM: CT HEAD WITHOUT CONTRAST TECHNIQUE: Contiguous axial images were obtained from the base of the skull through the vertex without intravenous contrast. COMPARISON:  None. FINDINGS: BRAIN: There is mild sulcal and ventricular prominence consistent with superficial and central atrophy. No intraparenchymal hemorrhage, mass effect nor midline shift. Periventricular and subcortical white matter hypodensities consistent with chronic  small vessel ischemic disease are identified. No acute large vascular territory infarcts. No abnormal extra-axial fluid collections. Basal cisterns are not  effaced and midline. Brainstem and cerebellum are intact and nonacute. VASCULAR: Moderate calcific atherosclerosis of the carotid siphons. SKULL: No skull fracture. No significant scalp soft tissue swelling. SINUSES/ORBITS: The mastoid air-cells are clear. The included paranasal sinuses are well-aerated. Polypoid mucosal thickening along the periphery of the left maxillary sinus is noted. Mild membrane thickening of the ethmoid sinus. Slight sphenoid sinus mucosal thickening is seen bilaterally.The included ocular globes and orbital contents are non-suspicious. No acute retrobulbar abnormality is seen. OTHER: None. IMPRESSION: 1. Atrophy with chronic appearing small vessel ischemia. No acute intracranial abnormality. 2. Mild-to-moderate paranasal sinus mucosal thickening. 3. Intact orbits and globes. Symmetric appearance of the extraocular muscles and optic nerves. Electronically Signed   By: Ashley Royalty M.D.   On: 02/02/2019 02:07    Pending Labs Unresulted Labs (From admission, onward)    Start     Ordered   Signed and Held  TSH  Add-on,   R     Signed and Held          Vitals/Pain Today's Vitals   02/02/19 0115 02/02/19 0116 02/02/19 0204 02/02/19 0219  BP: (!) 90/52  (!) 88/57 (!) 81/48  Pulse: (!) 57  (!) 51 (!) 49  Resp: 14  15 13   SpO2: 94%  94% 94%  Weight:  47.6 kg    Height:  5\' 2"  (1.575 m)    PainSc: 0-No pain   0-No pain    Isolation Precautions No active isolations  Medications Medications  cefTRIAXone (ROCEPHIN) 1 g in sodium chloride 0.9 % 100 mL IVPB (1 g Intravenous New Bag/Given 02/02/19 0350)  sodium chloride 0.9 % bolus 1,000 mL (0 mLs Intravenous Stopped 02/02/19 0351)  dexamethasone (DECADRON) injection 10 mg (10 mg Intravenous Given 02/02/19 0231)    Mobility non-ambulatory High fall risk   Focused  Assessments Musculoskeletal; HEENT   R Recommendations: See Admitting Provider Note  Report given to:   Additional Notes: Hx of MS; Foley cath in place at arrival to this ED

## 2019-02-02 NOTE — ED Notes (Signed)
Pt has foley catheter in place upon arrival. Urine is cloudy and drainage tube has sediment in it. Pt states her foley was replaced last week.

## 2019-02-02 NOTE — Clinical Social Work Note (Signed)
Clinical Social Work Assessment  Patient Details  Name: Cynthia Price MRN: [id number] Date of Birth: 1945-12-24  Date of referral:  02/02/19               Reason for consult:  Facility Placement                Permission sought to share information with:  Chartered certified accountant granted to share information::  Yes, Verbal Permission Granted  Name::        Agency::  [facility]  Relationship::     Contact Information:     Housing/Transportation Living arrangements for the past 2 months:  [place] of Information:  Medical Team, Patient, Facility, Engineer, materials Patient Interpreter Needed:  None Criminal Activity/Legal Involvement Pertinent to Current Situation/Hospitalization:  No - Comment as needed Significant Relationships:  Adult Children, Warehouse manager Lives with:  Facility Resident Do you feel safe going back to the place where you live?  Yes Need for family participation in patient care:  No (Coment)  Care giving concerns:  Patient admitted from [facility] Worker assessment / plan:  The CSW received a verbal consult from the [facility] that this patient admitted from [facility]. The CSW met with the patient and a friend of her family at bedside to introduce self and role in care. The patient confirmed that she is a LTC resident of [facility] and plans to return when stable. The friend of the family asked about changing facilities; the CSW advised that change in facility would need to be discussed at the facility level.   The CSW has attempted X2 to contact the SNF to confirm that the patient can return upon discharge. The CSW will continue to attempt and is following to facilitate discharge when the patient is stable.     Employment status:  Retired Forensic scientist:  Information systems manager, Medicaid In [place] PT Recommendations:  Not assessed at this time Information / Referral to community resources:      Patient/Family's Response to care: The patient thanked the CSW.  Patient/Family's Understanding of and Emotional Response to Diagnosis, Current Treatment, and Prognosis:  The patient seems to understand and agrees with the discharge plan.  Emotional Assessment Appearance:  Appears stated age Attitude/Demeanor/Rapport:  Gracious Affect (typically observed):  Pleasant, Stable Orientation:  Oriented to Self, Oriented to  Time, Oriented to Place, Oriented to Situation Alcohol / Substance use:  Never Used Psych involvement (Current and /or in the community):  No (Comment)  Discharge Needs  Concerns to be addressed:  Care Coordination, Discharge Planning Concerns Readmission within the last 30 days:  No Current discharge risk:  Chronically ill, Physical Impairment Barriers to Discharge:  Continued Medical Work up   [employer], LCSW 02/02/2019, 1:43 PM

## 2019-02-02 NOTE — NC FL2 (Addendum)
[place] LEVEL OF CARE SCREENING TOOL     IDENTIFICATION  Patient Name: Cynthia Price Birthdate: 12/13/1945 Sex: female Admission Date (Current Location): 02/02/2019  [place] and [place] Number:  Cynthia Price [id number] [place] and Address:  [facility], [address], [place],  [address]      Provider Number: [id number]  Attending Physician Name and Address:  Hillary Bow, MD  Relative Name and Phone Number:  [place] [facility]) [phone]    Current Level of Care: Hospital Recommended Level of Care: [place] Prior Approval Number:    Date Approved/Denied:   PASRR Number: [id number]  Discharge Plan: SNF    Current Diagnoses: Patient Active Problem List   Diagnosis Date Noted  . Neurogenic pain 12/17/2018  . Palliative care encounter 12/13/2018  . Shortness of breath 12/13/2018  . Weakness generalized 12/13/2018  . Spastic diplegia ([place]) 11/13/2018  . Abnormal MRI, cervical spine (07/27/2018) 10/09/2018  . Cervical central spinal stenosis 10/09/2018  . Cervical foraminal stenosis 10/09/2018  . Cervical facet arthropathy 10/09/2018  . Cervicalgia 10/09/2018  . Cervical facet syndrome (Bilateral) (R>L) 10/09/2018  . Chronic upper extremity pain (Secondary Area of Pain) (Bilateral) (R>L) 09/17/2018  . Long term current use of anticoagulant therapy (Xarelto) 09/17/2018  . DDD (degenerative disc disease), cervical 09/17/2018  . Chronic neck pain (Primary Area of Pain) (Bilateral) (R>L) 08/27/2018  . Chronic low back pain [facility] Area of Pain) (Bilateral) (L>R) w/ sciatica (Bilateral) 08/27/2018  . Chronic knee pain (Fourth Area of Pain) (Bilateral) (L>R) 08/27/2018  . Chronic lower extremity pain (Bilateral) (L>R) 08/27/2018  . Chronic pain syndrome 08/27/2018  . Pharmacologic therapy 08/27/2018  . Disorder of skeletal system 08/27/2018  . Problems influencing health status 08/27/2018  . Long term current  use of opiate analgesic 08/27/2018  . Degenerative cervical spinal stenosis 08/06/2018  . Resides in skilled nursing facility 08/06/2018  . Rash 06/19/2018  . Muscle spasticity 05/17/2018  . Inability to walk 05/17/2018  . Mild protein-calorie malnutrition ([place]) 01/03/2018  . Ulcer, skin, non-healing, limited to breakdown of skin ([place]) 12/19/2017  . Intractable pain 08/19/2017  . Chronic deep vein thrombosis (DVT) of proximal vein of left lower extremity ([place]) 05/16/2017  . Seizure-like activity ([place]) 04/20/2017  . Bladder mass 01/20/2017  . Encounter for general adult medical examination without abnormal findings 11/24/2016  . Spondylosis of lumbar region without myelopathy or radiculopathy 05/23/2016  . 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 Urinary retention with incomplete bladder emptying 12/22/2015  . ET (essential thrombocythemia) ([place]) 12/22/2015  . DS (disseminated sclerosis) ([place]) 09/15/2015  . Rotator cuff tendinitis (Right) 06/19/2015  . Back sprain 06/05/2015  . Chest wall contusion 06/05/2015  . Effusion of knee 05/22/2015  . Arthritis of knee, degenerative 05/22/2015  . Effusion of left knee 05/22/2015  . Osteoarthritis of knee (Left) 05/22/2015  . Contusion of shoulder 04/14/2015  . 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[place]) 03/05/2015  . Mild pulmonary hypertension ([place]) 03/05/2015  . Complete rotator cuff rupture of shoulder (Left) 02/05/2015  . Infraspinatus tenosynovitis 02/05/2015  . Complete tear of left rotator cuff 02/05/2015  . Chronic diastolic heart failure ([place]) 09/07/2014  . Deep vein thrombosis ([place]) 07/10/2014  . History of deep vein thrombosis (DVT) of lower extremity 07/10/2014  . HLD (hyperlipidemia) 03/08/2014  . BP (high blood pressure) 03/08/2014  . Hyperlipidemia 03/08/2014  . Hypertension 03/08/2014  . UTI (urinary tract infection) 02/05/2014  . Cellulitis, toe 08/18/2013  . Bradycardia 03/26/2013  . Hypotension 03/26/2013  .  RLS  (restless legs syndrome) 08/31/2012  . Constipation 07/20/2012  . Prolapse of urethra 07/20/2012  . Frequent UTI 07/20/2012  . Neurogenic dysfunction of the urinary bladder 07/20/2012  . Vaginal atrophy 07/20/2012  . Recurrent UTI 07/20/2012  . Neuromuscular dysfunction of bladder 07/20/2012  . Prolapse urethral mucosa 07/20/2012  . DVT (deep venous thrombosis) ([place]) 05/14/2012  . Risk for falls 03/16/2012  . Lumbar canal stenosis 02/16/2012  . Lumbosacral radiculitis 02/16/2012  . Restless leg 01/20/2012  . 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 Restless legs syndrome (RLS) 01/20/2012  . Urinary retention 12/04/2011  . CAD (coronary artery disease) 10/18/2011  . Anemia 10/17/2011  . Diastolic dysfunction 34/19/6222  . Tachycardia 10/01/2011  . Thrombocythemia, essential ([place]) 05/26/2011  . Edema 05/26/2011  . HYPERCHOLESTEROLEMIA 04/29/2010  . Depression 04/29/2010  . CYSTOCELE WITHOUT MENTION UTERINE PROLAPSE LAT 04/29/2010  . UNS ADVRS EFF OTH RX MEDICINAL&BIOLOGICAL SBSTNC 04/29/2010  . Depressive disorder, not elsewhere classified 04/29/2010  . Vitamin D deficiency 04/29/2010  . Multiple sclerosis ([place]) 04/29/2008  . NEUROPATHY 04/29/2008  . FATIGUE 04/29/2008    Orientation RESPIRATION BLADDER Height & Weight     Self, Time, Place, Situation  Normal Incontinent/Foley Catheter Weight: 105 lb (47.6 kg) Height:  5\' 2"  (157.5 cm)  BEHAVIORAL SYMPTOMS/MOOD NEUROLOGICAL BOWEL NUTRITION STATUS      Incontinent    AMBULATORY STATUS COMMUNICATION OF NEEDS Skin   Extensive Assist Verbally Normal                       Personal Care Assistance Level of Assistance  Bathing, Feeding, Dressing Bathing Assistance: Maximum assistance Feeding assistance: Limited assistance Dressing Assistance: Maximum assistance     Functional Limitations Info  Sight, Hearing, Speech Sight Info: Adequate Hearing Info: Adequate Speech Info: Adequate    SPECIAL  CARE FACTORS FREQUENCY                       Contractures Contractures Info: Not present    Additional Factors Info  Code Status, Allergies, Psychotropic Code Status Info: DNR Allergies Info: Atorvastatin Psychotropic Info: Lexapro, Gabapentin         Current Medications (02/02/2019):  This is the current hospital active medication list Current Facility-Administered Medications  Medication Dose Route Frequency Provider Last Rate Last Dose  . acetaminophen (TYLENOL) tablet 650 mg  650 mg Oral Q6H PRN [name], MD       Or  . acetaminophen (TYLENOL) suppository 650 mg  650 mg Rectal Q6H PRN [name], MD      . baclofen (LIORESAL) tablet 10 mg  10 mg Oral QID Hillary Bow, MD   10 mg at 02/02/19 1159  . [START ON 02/03/2019] cefTRIAXone (ROCEPHIN) 1 g in sodium chloride 0.9 % 100 mL IVPB  1 g Intravenous Q24H [name], MD      . dalfampridine TB12 10 mg  10 mg Oral BID Hillary Bow, MD      . docusate sodium (COLACE) capsule 100 mg  100 mg Oral BID [name], MD   100 mg at 02/02/19 1200  . escitalopram (LEXAPRO) tablet 5 mg  5 mg Oral Daily Hillary Bow, MD   5 mg at 02/02/19 1159  . gabapentin (NEURONTIN) capsule 800 mg  800 mg Oral QID Hillary Bow, MD   800 mg at 02/02/19 1200  . [START ON 02/04/2019] hydroxyurea (HYDREA) capsule 500 mg  500 mg Oral Q  MTWThF Hillary Bow, MD      . Melatonin TABS 5 mg  5 mg Oral QHS PRN Hillary Bow, MD      . Derrill Memo ON 02/03/2019] methylPREDNISolone sodium succinate (SOLU-MEDROL) 1,000 mg in sodium chloride 0.9 % 50 mL IVPB  1,000 mg Intravenous Daily [name], MD      . metoCLOPramide (REGLAN) tablet 5 mg  5 mg Oral TID AC & HS Hillary Bow, MD   5 mg at 02/02/19 1206  . ondansetron (ZOFRAN) tablet 4 mg  4 mg Oral Q6H PRN [name], MD       Or  . ondansetron [facility]) injection 4 mg  4 mg Intravenous Q6H PRN [name], MD      . oxyCODONE (Oxy IR/ROXICODONE) immediate  release tablet 5 mg  5 mg Oral Q6H PRN Hillary Bow, MD   5 mg at 02/02/19 1103  . [START ON 02/03/2019] rivaroxaban (XARELTO) tablet 20 mg  20 mg Oral Q breakfast Sudini, Srikar, MD      . rOPINIRole (REQUIP) tablet 2 mg  2 mg Oral QID Hillary Bow, MD   2 mg at 02/02/19 1206  . tiZANidine (ZANAFLEX) tablet 4 mg  4 mg Oral TID Hillary Bow, MD   4 mg at 02/02/19 1159  . traZODone (DESYREL) tablet 50 mg  50 mg Oral QHS Hillary Bow, MD         Discharge Medications: Please see discharge summary for a list of discharge medications.  Relevant Imaging Results:  Relevant Lab Results:   Additional Information [id number]  Zettie Pho, LCSW

## 2019-02-02 NOTE — H&P (Signed)
Cynthia Price is an 73 y.o. female.   Chief Complaint: Loss of vision HPI: The patient with past medical history of multiple sclerosis, essential thrombocytosis, hypotension, CAD, diastolic CHF macular degeneration presents to the emergency department complaining of loss of vision in her left eye.  Apparently the patient also had some altered mental status at some point prior to arrival.  CT of her head was negative for acute abnormality.  Patient was given Decadron which improved her eyesight.  Operatory evaluation revealed urinary tract infection.  The patient was started on ceftriaxone prior to the emergency department staff calling the hospitalist service for admission.  Past Medical History:  Diagnosis Date  . Anemia 2012   from labs at [facility]  . Back pain    s/p hemilaminectomy with h/o herniated disc at L4L5  . Blood clot in vein    behind left knee  . BP (high blood pressure) 03/08/2014   Last Assessment & Plan:  Blood pressure has been controlled without significant dizzyness or associated fatigue. Taking antihypertensives as directed without difficulty.    Marland Kitchen CAD (coronary artery disease) 10/18/2011  . Chronic diastolic heart failure ([place]) 09/07/2014   Last Assessment & Plan:  Edema and sob seemingly baseline   . Collagen vascular disease ([place])   . Depression    after husband died  . DVT (deep venous thrombosis) ([place]) 05/14/2012  . Essential thrombocytosis ([place])   . Hyperlipidemia   . 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[place]) 03/05/2015  . Hypotension 03/26/2013  . IBS (irritable bowel syndrome)   . Insomnia   . Lupus anticoagulant positive   . Macular degeneration   . MS (multiple sclerosis) ([place])   . Multiple sclerosis ([place]) 04/29/2008   Per neurology at [facility]    . NEUROPATHY 04/29/2008   Per [facility]rology at [facility]    . RLS (restless legs syndrome)   . Spinal stenosis    lumbar  . Urinary retention    with high post void residuals 2013, per Mallie Mussel    Past Surgical History:   Procedure Laterality Date  . ANTERIOR LUMBAR FUSION  2012  . APPENDECTOMY    . CHOLECYSTECTOMY    . CHOLECYSTECTOMY    . LUMBAR DISC SURGERY    . LUMBAR LAMINECTOMY    . TONSILLECTOMY      Family History  Problem Relation Age of Onset  . Heart disease Mother   . Thyroid cancer Mother   . Ovarian cancer Mother   . Alcohol abuse Father   . Heart disease Father   . Diabetes Sister   . Diabetes Brother   . Multiple sclerosis Paternal Aunt   . Multiple sclerosis Cousin    Social History:  reports that she has never smoked. She has never used smokeless tobacco. She reports that she does not drink alcohol or use drugs.  Allergies:  Allergies  Allergen Reactions  . Atorvastatin     Muscle aches at >40mg      (Not in a hospital admission)   Results for orders placed or performed during the hospital encounter of 02/02/19 (from the past 48 hour(s))  CBC     Status: Abnormal   Collection Time: 02/02/19  1:21 AM  Result Value Ref Range   WBC 6.0 4.0 - 10.5 K/uL   RBC 3.16 (L) 3.87 - 5.11 MIL/uL   Hemoglobin 9.8 (L) 12.0 - 15.0 g/dL   HCT 32.5 (L) 36.0 - 46.0 %   MCV 102.8 (H) 80.0 - 100.0 fL  MCH 31.0 26.0 - 34.0 pg   MCHC 30.2 30.0 - 36.0 g/dL   RDW 14.0 11.5 - 15.5 %   Platelets 329 150 - 400 K/uL   nRBC 0.0 0.0 - 0.2 %    Comment: Performed at [facility], [address] [place], [place] [address]  Comprehensive metabolic panel     Status: Abnormal   Collection Time: 02/02/19  1:21 AM  Result Value Ref Range   Sodium 143 135 - 145 mmol/L   Potassium 4.3 3.5 - 5.1 mmol/L   Chloride 105 98 - 111 mmol/L   CO2 31 22 - 32 mmol/L   Glucose, Bld 115 (H) 70 - 99 mg/dL   BUN 24 (H) 8 - 23 mg/dL   Creatinine, Ser 0.92 0.44 - 1.00 mg/dL   Calcium 9.1 8.9 - 10.3 mg/dL   Total Protein 6.5 6.5 - 8.1 g/dL   Albumin 3.2 (L) 3.5 - 5.0 g/dL   AST 18 15 - 41 U/L   ALT 10 0 - 44 U/L   Alkaline Phosphatase 51 38 - 126 U/L   Total Bilirubin 0.5 0.3 - 1.2 mg/dL   GFR  calc non Af Amer >60 >60 mL/min   GFR calc Af Amer >60 >60 mL/min   Anion gap 7 5 - 15    Comment: Performed at [facility], [address] [place], [place] [address]  Urinalysis, Complete w Microscopic     Status: Abnormal   Collection Time: 02/02/19  1:24 AM  Result Value Ref Range   Color, Urine YELLOW (A) YELLOW   APPearance TURBID (A) CLEAR   Specific Gravity, Urine 1.019 1.005 - 1.030   pH 7.0 5.0 - 8.0   Glucose, UA NEGATIVE NEGATIVE mg/dL   Hgb urine dipstick NEGATIVE NEGATIVE   Bilirubin Urine NEGATIVE NEGATIVE   Ketones, ur NEGATIVE NEGATIVE mg/dL   Protein, ur 30 (A) NEGATIVE mg/dL   Nitrite NEGATIVE NEGATIVE   Leukocytes,Ua LARGE (A) NEGATIVE   RBC / HPF 21-50 0 - 5 RBC/hpf   WBC, UA >50 (H) 0 - 5 WBC/hpf   Bacteria, UA RARE (A) NONE SEEN   Squamous Epithelial / LPF NONE SEEN 0 - 5   Mucus PRESENT    Triple Phosphate Crystal PRESENT     Comment: Performed at [facility], [address] [place], [place] [address]   Ct Head Wo Contrast  Result Date: 02/02/2019 CLINICAL DATA:  Transient ischemic attack. New onset of visual loss in the left eye. EXAM: CT HEAD WITHOUT CONTRAST TECHNIQUE: Contiguous axial images were obtained from the base of the skull through the vertex without intravenous contrast. COMPARISON:  None. FINDINGS: BRAIN: There is mild sulcal and ventricular prominence consistent with superficial and central atrophy. No intraparenchymal hemorrhage, mass effect nor midline shift. Periventricular and subcortical white matter hypodensities consistent with chronic small vessel ischemic disease are identified. No acute large vascular territory infarcts. No abnormal extra-axial fluid collections. Basal cisterns are not effaced and midline. Brainstem and cerebellum are intact and nonacute. VASCULAR: Moderate calcific atherosclerosis of the carotid siphons. SKULL: No skull fracture. No significant scalp soft tissue swelling. SINUSES/ORBITS: The mastoid  air-cells are clear. The included paranasal sinuses are well-aerated. Polypoid mucosal thickening along the periphery of the left maxillary sinus is noted. Mild membrane thickening of the ethmoid sinus. Slight sphenoid sinus mucosal thickening is seen bilaterally.The included ocular globes and orbital contents are non-suspicious. No acute retrobulbar abnormality is seen. OTHER: None. IMPRESSION: 1. Atrophy with  chronic appearing small vessel ischemia. No acute intracranial abnormality. 2. Mild-to-moderate paranasal sinus mucosal thickening. 3. Intact orbits and globes. Symmetric appearance of the extraocular muscles and optic nerves. Electronically Signed   By: Ashley Royalty M.D.   On: 02/02/2019 02:07    Review of Systems  Unable to perform ROS: Acuity of condition    Blood pressure 138/66, pulse 72, temperature 97.8 F (36.6 C), temperature source Oral, resp. rate 18, height 5\' 2"  (1.575 m), weight 47.6 kg, SpO2 96 %. Physical Exam  Vitals reviewed. Constitutional: She is oriented to person, place, and time. She appears well-developed and well-nourished. No distress.  HENT:  Head: Normocephalic and atraumatic.  Mouth/Throat: Oropharynx is clear and moist.  Eyes: Pupils are equal, round, and reactive to light. Conjunctivae and EOM are normal. No scleral icterus.  Neck: Normal range of motion. Neck supple. No JVD present. No tracheal deviation present. No thyromegaly present.  Cardiovascular: Normal rate, regular rhythm and normal heart sounds. Exam reveals no gallop and no friction rub.  No murmur heard. Respiratory: Effort normal and breath sounds normal.  GI: Soft. Bowel sounds are normal. She exhibits no distension. There is no abdominal tenderness.  Genitourinary:    Genitourinary Comments: Deferred   Musculoskeletal: Normal range of motion.        General: No edema.  Lymphadenopathy:    She has no cervical adenopathy.  Neurological: She is alert and oriented to person, place, and time.  No cranial nerve deficit. She exhibits normal muscle tone.  Skin: Skin is warm and dry. No rash noted. No erythema.  Psychiatric:  Difficult to assess MMSE as patient is sleeping deeply and difficult to wake     Assessment/Plan This is a 73 year old female admitted for multiple sclerosis exacerbation. 1.  Multiple sclerosis: Vision changes resolved with Decadron.  The patient has left-sided weakness at baseline.  Her platelet count is normal at this time but differential diagnosis of vision loss includes platelet clumping.  Solu-Medrol 1 g daily.  Continue Ampyra.  Neurology consultation at the discretion of primary team. 2.  UTI: Present on admission; no signs or symptoms of sepsis.  Continue ceftriaxone 3.  Hypotension: Apparently the patient is at baseline.  Continue to monitor blood pressure.  Gently hydrate with intravenous fluid. 4.  Essential thrombocytosis: Continue hydroxyurea. 5.  Restless leg syndrome: Continue Requip 6.  DVT prophylaxis: Continue Xarelto 7.  GI prophylaxis: None The patient is a DNR.  Time spent on admission orders and patient care approximately 45 minutes  [name], MD 02/02/2019, 6:06 AM

## 2019-02-03 NOTE — Progress Notes (Signed)
Advance care planning  Purpose of Encounter Multiple sclerosis, vision changes  Parties in Attendance Patient  Patients Decisional capacity Patient is alert and oriented.  Able to make medical decisions.  Her healthcare power of attorney are jointly her son and daughter.  Discussed regarding patient's multiple sclerosis and vision changes.  She tells me that she was not treated when she was young for multiple sclerosis and this has caused significant problems and deterioration and she has been bedbound over the last year.  Presently not many treatment options according to her neurologist.  She is hopeful with the steroids her vision changes would improve.  Patient's CODE STATUS discussed and she confirms that she is DNR/DNI.  Orders entered  Time spent - 17 minutes

## 2019-02-03 NOTE — Progress Notes (Signed)
[facility] [place] NAME: Cynthia Price    MR#:  [id number]  DATE OF BIRTH:  1946-11-25  SUBJECTIVE:  CHIEF COMPLAINT:   Chief Complaint  Patient presents with  . Loss of Vision  . Altered Mental Status   Still has blurred vision left eye  REVIEW OF SYSTEMS:    Review of Systems  Constitutional: Positive for malaise/fatigue. Negative for chills and fever.  HENT: Negative for sore throat.   Eyes: Positive for blurred vision. Negative for double vision and pain.  Respiratory: Negative for cough, hemoptysis, shortness of breath and wheezing.   Cardiovascular: Negative for chest pain, palpitations, orthopnea and leg swelling.  Gastrointestinal: Negative for abdominal pain, constipation, diarrhea, heartburn, nausea and vomiting.  Genitourinary: Negative for dysuria and hematuria.  Musculoskeletal: Negative for back pain and joint pain.  Skin: Negative for rash.  Neurological: Positive for weakness. Negative for sensory change, speech change, focal weakness and headaches.  Endo/Heme/Allergies: Does not bruise/bleed easily.  Psychiatric/Behavioral: Negative for depression. The patient is not nervous/anxious.     DRUG ALLERGIES:   Allergies  Allergen Reactions  . Atorvastatin     Muscle aches at >40mg      VITALS:  Blood pressure 132/66, pulse 63, temperature 98.7 F (37.1 C), temperature source Oral, resp. rate 18, height 5\' 2"  (1.575 m), weight 47.6 kg, SpO2 96 %.  PHYSICAL EXAMINATION:   Physical Exam  GENERAL:  73 y.o.-year-old patient lying in the bed with no acute distress.  EYES: Pupils equal, round, reactive to light and accommodation. No scleral icterus. Extraocular muscles intact.  HEENT: Head atraumatic, normocephalic. Oropharynx and nasopharynx clear.  NECK:  Supple, no jugular venous distention. No thyroid enlargement, no tenderness.  LUNGS: Normal breath sounds bilaterally, no wheezing, rales, rhonchi. No use of  accessory muscles of respiration.  CARDIOVASCULAR: S1, S2 normal. No murmurs, rubs, or gallops.  ABDOMEN: Soft, nontender, nondistended. Bowel sounds present. No organomegaly or mass.  EXTREMITIES: No cyanosis, clubbing or edema b/l.    NEUROLOGIC: Cranial nerves II through XII are intact. Motor upper 5-/5 and LE 3/5   PSYCHIATRIC: The patient is alert and oriented x 3.  SKIN: No obvious rash, lesion, or ulcer.   LABORATORY PANEL:   CBC Recent Labs  Lab 02/02/19 0121  WBC 6.0  HGB 9.8*  HCT 32.5*  PLT 329   ------------------------------------------------------------------------------------------------------------------ Chemistries  Recent Labs  Lab 02/02/19 0121  NA 143  K 4.3  CL 105  CO2 31  GLUCOSE 115*  BUN 24*  CREATININE 0.92  CALCIUM 9.1  AST 18  ALT 10  ALKPHOS 51  BILITOT 0.5   ------------------------------------------------------------------------------------------------------------------  Cardiac Enzymes No results for input(s): TROPONINI in the last 168 hours. ------------------------------------------------------------------------------------------------------------------  RADIOLOGY:  Ct Head Wo Contrast  Result Date: 02/02/2019 CLINICAL DATA:  Transient ischemic attack. New onset of visual loss in the left eye. EXAM: CT HEAD WITHOUT CONTRAST TECHNIQUE: Contiguous axial images were obtained from the base of the skull through the vertex without intravenous contrast. COMPARISON:  None. FINDINGS: BRAIN: There is mild sulcal and ventricular prominence consistent with superficial and central atrophy. No intraparenchymal hemorrhage, mass effect nor midline shift. Periventricular and subcortical white matter hypodensities consistent with chronic small vessel ischemic disease are identified. No acute large vascular territory infarcts. No abnormal extra-axial fluid collections. Basal cisterns are not effaced and midline. Brainstem and cerebellum are intact and  nonacute. VASCULAR: Moderate calcific atherosclerosis of the carotid siphons. SKULL: No skull fracture. No  significant scalp soft tissue swelling. SINUSES/ORBITS: The mastoid air-cells are clear. The included paranasal sinuses are well-aerated. Polypoid mucosal thickening along the periphery of the left maxillary sinus is noted. Mild membrane thickening of the ethmoid sinus. Slight sphenoid sinus mucosal thickening is seen bilaterally.The included ocular globes and orbital contents are non-suspicious. No acute retrobulbar abnormality is seen. OTHER: None. IMPRESSION: 1. Atrophy with chronic appearing small vessel ischemia. No acute intracranial abnormality. 2. Mild-to-moderate paranasal sinus mucosal thickening. 3. Intact orbits and globes. Symmetric appearance of the extraocular muscles and optic nerves. Electronically Signed   By: Ashley Royalty M.D.   On: 02/02/2019 02:07     ASSESSMENT AND PLAN:   This is a 73 year old female admitted for multiple sclerosis exacerbation.  1.  Multiple sclerosis: Vision changes . Started on Decadron.   Day- 2 Neurology recommended 3 doses. D/C tomorrow after tomorrow's dose  2.  UTI  Continue ceftriaxone  3.  Thrombocytosis: Continue hydroxyurea. 4.  Restless leg syndrome: Continue Requip 5.  DVT prophylaxis: Continue Xarelto  All the records are reviewed and case discussed with Care Management/Social Worker Management plans discussed with the patient, family and they are in agreement.  CODE STATUS: DNR/DNI  DVT Prophylaxis: SCDs  TOTAL TIME TAKING CARE OF THIS PATIENT: 35 minutes.   POSSIBLE D/C IN 1-2 DAYS, DEPENDING ON CLINICAL CONDITION.  Leia Alf Calix Heinbaugh M.D on 02/03/2019 at 12:26 PM  Between 7am to 6pm - Pager - [phone]  After 6pm go to [email or web address] - password [email or web address] [facility]  Office  [phone]  CC: Primary care physician; [name], MD  Note: This dictation was prepared with Dragon dictation  along with smaller phrase technology. Any transcriptional errors that result from this process are unintentional.

## 2019-02-03 NOTE — Progress Notes (Signed)
Son called and notified of pt transfer. New room number given.

## 2019-02-04 DIAGNOSIS — H5462 Unqualified visual loss, left eye, normal vision right eye: Secondary | ICD-10-CM | POA: Diagnosis present

## 2019-02-04 DIAGNOSIS — I2510 Atherosclerotic heart disease of native coronary artery without angina pectoris: Secondary | ICD-10-CM | POA: Diagnosis present

## 2019-02-04 DIAGNOSIS — Z981 Arthrodesis status: Secondary | ICD-10-CM | POA: Diagnosis not present

## 2019-02-04 DIAGNOSIS — D6862 Lupus anticoagulant syndrome: Secondary | ICD-10-CM | POA: Diagnosis present

## 2019-02-04 DIAGNOSIS — G2581 Restless legs syndrome: Secondary | ICD-10-CM | POA: Diagnosis present

## 2019-02-04 DIAGNOSIS — I110 Hypertensive heart disease with heart failure: Secondary | ICD-10-CM | POA: Diagnosis present

## 2019-02-04 DIAGNOSIS — I998 Other disorder of circulatory system: Secondary | ICD-10-CM | POA: Diagnosis present

## 2019-02-04 DIAGNOSIS — F329 Major depressive disorder, single episode, unspecified: Secondary | ICD-10-CM | POA: Diagnosis present

## 2019-02-04 DIAGNOSIS — R339 Retention of urine, unspecified: Secondary | ICD-10-CM | POA: Diagnosis present

## 2019-02-04 DIAGNOSIS — E785 Hyperlipidemia, unspecified: Secondary | ICD-10-CM | POA: Diagnosis present

## 2019-02-04 DIAGNOSIS — Z9049 Acquired absence of other specified parts of digestive tract: Secondary | ICD-10-CM | POA: Diagnosis not present

## 2019-02-04 DIAGNOSIS — R4182 Altered mental status, unspecified: Secondary | ICD-10-CM | POA: Diagnosis present

## 2019-02-04 DIAGNOSIS — I959 Hypotension, unspecified: Secondary | ICD-10-CM | POA: Diagnosis present

## 2019-02-04 DIAGNOSIS — G35 Multiple sclerosis: Secondary | ICD-10-CM | POA: Diagnosis present

## 2019-02-04 DIAGNOSIS — D473 Essential (hemorrhagic) thrombocythemia: Secondary | ICD-10-CM | POA: Diagnosis present

## 2019-02-04 DIAGNOSIS — Z66 Do not resuscitate: Secondary | ICD-10-CM | POA: Diagnosis present

## 2019-02-04 DIAGNOSIS — I272 Pulmonary hypertension, unspecified: Secondary | ICD-10-CM | POA: Diagnosis present

## 2019-02-04 DIAGNOSIS — N390 Urinary tract infection, site not specified: Secondary | ICD-10-CM | POA: Diagnosis present

## 2019-02-04 DIAGNOSIS — K589 Irritable bowel syndrome without diarrhea: Secondary | ICD-10-CM | POA: Diagnosis present

## 2019-02-04 DIAGNOSIS — I5032 Chronic diastolic (congestive) heart failure: Secondary | ICD-10-CM | POA: Diagnosis present

## 2019-02-04 DIAGNOSIS — H3530 Unspecified macular degeneration: Secondary | ICD-10-CM | POA: Diagnosis present

## 2019-02-04 DIAGNOSIS — G894 Chronic pain syndrome: Secondary | ICD-10-CM | POA: Diagnosis present

## 2019-02-04 DIAGNOSIS — G629 Polyneuropathy, unspecified: Secondary | ICD-10-CM | POA: Diagnosis present

## 2019-02-04 DIAGNOSIS — G4700 Insomnia, unspecified: Secondary | ICD-10-CM | POA: Diagnosis present

## 2019-02-04 MED ORDER — BACLOFEN 10 MG PO TABS
15.0000 mg | ORAL_TABLET | Freq: Four times a day (QID) | ORAL | Status: DC
  Administered 2019-02-04: 15 mg via ORAL
  Filled 2019-02-04 (×4): qty 1.5

## 2019-02-04 NOTE — Clinical Social Work Note (Signed)
Patient is medically ready for discharge today. CSW notified patient of discharge today. CSW also notified Magda Paganini at [facility] of discharge today. Patient will be transported by EMS. RN to call report and call for transport.   [place], [place]

## 2019-02-04 NOTE — Discharge Summary (Signed)
Cynthia Price at [place] NAME: Cynthia Price    MR#:  [id number]  DATE OF BIRTH:  1945/12/28  DATE OF ADMISSION:  02/02/2019 ADMITTING PHYSICIAN: [name], MD  DATE OF DISCHARGE: 02/04/2019  PRIMARY CARE PHYSICIAN: [name], MD   ADMISSION DIAGNOSIS:  Unable to move leg  DISCHARGE DIAGNOSIS:  Active Problems:   Multiple sclerosis ([place])   SECONDARY DIAGNOSIS:   Past Medical History:  Diagnosis Date  . Anemia 2012   from labs at [facility]  . Back pain    s/p hemilaminectomy with h/o herniated disc at L4L5  . Blood clot in vein    behind left knee  . BP (high blood pressure) 03/08/2014   Last Assessment & Plan:  Blood pressure has been controlled without significant dizzyness or associated fatigue. Taking antihypertensives as directed without difficulty.    Marland Kitchen CAD (coronary artery disease) 10/18/2011  . Chronic diastolic heart failure ([place]) 09/07/2014   Last Assessment & Plan:  Edema and sob seemingly baseline   . Collagen vascular disease ([place])   . Depression    after husband died  . DVT (deep venous thrombosis) ([place]) 05/14/2012  . Essential thrombocytosis ([place])   . Hyperlipidemia   . 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[place]) 03/05/2015  . Hypotension 03/26/2013  . IBS (irritable bowel syndrome)   . Insomnia   . Lupus anticoagulant positive   . Macular degeneration   . MS (multiple sclerosis) ([place])   . Multiple sclerosis ([place]) 04/29/2008   Per neurology at [facility]    . NEUROPATHY 04/29/2008   Per Neurology at [facility]    . RLS (restless legs syndrome)   . Spinal stenosis    lumbar  . Urinary retention    with high post void residuals 2013, per [facility] uro     ADMITTING HISTORY  Chief Complaint: Loss of vision HPI: The patient with past medical history of multiple sclerosis, essential thrombocytosis, hypotension, CAD, diastolic CHF macular degeneration presents to the emergency department complaining of loss of vision in her  left eye.  Apparently the patient also had some altered mental status at some point prior to arrival.  CT of her head was negative for acute abnormality.  Patient was given Decadron which improved her eyesight.  Operatory evaluation revealed urinary tract infection.  The patient was started on ceftriaxone prior to the emergency department staff calling the hospitalist service for admission.  HOSPITAL COURSE:   This is a 73 year old female admitted for multiple sclerosis exacerbation.  1. Multiple sclerosis: Vision changes . Started on Decadron and given for 3 days Neurology recommended 3 doses. Will discharge and have her f/u with her physician. Mild improvement at this time. Symptoms could also be due to macular degeneration  2. UTI Treated with ceftriaxone. Finished course.  3. Thrombocytosis: Continue hydroxyurea. 4. Restless leg syndrome: Continue Requip 5. DVT prophylaxis: Continue Xarelto  Stable for discharge back to her nursing home  CONSULTS OBTAINED:  Treatment Team:  [name], MD  DRUG ALLERGIES:   Allergies  Allergen Reactions  . Atorvastatin     Muscle aches at >40mg      DISCHARGE MEDICATIONS:   Allergies as of 02/04/2019      Reactions   Atorvastatin    Muscle aches at >40mg        Medication List    TAKE these medications   Ampyra 10 MG Tb12 Generic drug:  dalfampridine Take 10 mg by mouth 2 (two) times daily.  baclofen 10 MG tablet Commonly known as:  LIORESAL Take 1 tablet (10 mg total) by mouth 4 (four) times daily.   calcium carbonate 600 MG Tabs tablet Commonly known as:  Calcium 600 Take 1 tablet (600 mg total) by mouth 2 (two) times daily with a meal.   diclofenac sodium 1 % Gel Commonly known as:  VOLTAREN Apply 4 g topically 3 (three) times daily.   escitalopram 5 MG tablet Commonly known as:  LEXAPRO Take 5 mg by mouth daily.   gabapentin 800 MG tablet Commonly known as:  NEURONTIN Take 1 tablet (800 mg total) by  mouth 4 (four) times daily.   guaifenesin 100 MG/5ML syrup Commonly known as:  ROBITUSSIN Take 200 mg by mouth every 4 (four) hours as needed for cough.   hydroxyurea 500 MG capsule Commonly known as:  Hydrea Take one by mouth Monday, Tuesday, Wed, Thursday, Friday What changed:    how much to take  how to take this  when to take this  additional instructions   Magnesium 500 MG Caps Take 1 capsule (500 mg total) by mouth 2 (two) times daily at 8 am and 10 pm.   Melatonin 3 MG Tabs Take 6 mg by mouth at bedtime as needed (insomnia).   metoCLOPramide 5 MG tablet Commonly known as:  REGLAN Take 5 mg by mouth 4 (four) times daily -  before meals and at bedtime.   multivitamin tablet Take 1 tablet by mouth daily.   ondansetron 4 MG tablet Commonly known as:  ZOFRAN Take 4 mg by mouth every 4 (four) hours as needed for nausea or vomiting.   oxyCODONE 5 MG immediate release tablet Commonly known as:  Oxy IR/ROXICODONE Take 1 tablet (5 mg total) by mouth every 6 (six) hours as needed for up to 30 days for severe pain. Must last 30 days. What changed:  Another medication with the same name was changed. Make sure you understand how and when to take each.   oxyCODONE 5 MG immediate release tablet Commonly known as:  Oxy IR/ROXICODONE Take 1 tablet (5 mg total) by mouth every 6 (six) hours as needed for up to 30 days for severe pain. Must last 30 days. What changed:  when to take this   oxyCODONE 5 MG immediate release tablet Commonly known as:  Oxy IR/ROXICODONE Take 1 tablet (5 mg total) by mouth every 6 (six) hours as needed for up to 30 days for severe pain. Must Last 30 days. Start taking on:  February 15, 2019 What changed:  Another medication with the same name was changed. Make sure you understand how and when to take each.   rOPINIRole 2 MG tablet Commonly known as:  REQUIP Take 2 mg by mouth 4 (four) times daily.   tiZANidine 4 MG tablet Commonly known as:   ZANAFLEX Take 1 tablet (4 mg total) by mouth every 8 (eight) hours as needed for muscle spasms. What changed:  when to take this   traZODone 50 MG tablet Commonly known as:  DESYREL Take 50 mg by mouth at bedtime.   Vitamin D3 125 MCG (5000 UT) Caps Take 1 capsule (5,000 Units total) by mouth daily with breakfast. Take along with calcium and magnesium.   Xarelto 20 MG Tabs tablet Generic drug:  rivaroxaban Take 20 mg by mouth daily with breakfast.       Today   VITAL SIGNS:  Blood pressure 135/70, pulse 68, temperature 98.6 F (37 C), temperature source Oral,  resp. rate 16, height 5\' 2"  (1.575 m), weight 50.2 kg, SpO2 95 %.  I/O:    Intake/Output Summary (Last 24 hours) at 02/04/2019 1055 Last data filed at 02/04/2019 1008 Gross per 24 hour  Intake 600 ml  Output 1750 ml  Net -1150 ml    PHYSICAL EXAMINATION:  Physical Exam  GENERAL:  73 y.o.-year-old patient lying in the bed with no acute distress.  LUNGS: Normal breath sounds bilaterally, no wheezing, rales,rhonchi or crepitation. No use of accessory muscles of respiration.  CARDIOVASCULAR: S1, S2 normal. No murmurs, rubs, or gallops.  ABDOMEN: Soft, non-tender, non-distended. Bowel sounds present. No organomegaly or mass.  PSYCHIATRIC: The patient is alert and awake SKIN: No obvious rash, lesion, or ulcer.   DATA REVIEW:   CBC Recent Labs  Lab 02/02/19 0121  WBC 6.0  HGB 9.8*  HCT 32.5*  PLT 329    Chemistries  Recent Labs  Lab 02/02/19 0121  NA 143  K 4.3  CL 105  CO2 31  GLUCOSE 115*  BUN 24*  CREATININE 0.92  CALCIUM 9.1  AST 18  ALT 10  ALKPHOS 51  BILITOT 0.5    Cardiac Enzymes No results for input(s): TROPONINI in the last 168 hours.  Microbiology Results  Results for orders placed or performed in visit on 09/25/15  CULTURE, URINE COMPREHENSIVE     Status: None   Collection Time: 09/25/15 10:14 AM  Result Value Ref Range Status   Urine Culture, Comprehensive Final report  Final    Organism ID, Bacteria Comment  Final    Comment: No growth in 36 - 48 hours.  Microscopic Examination     Status: None   Collection Time: 09/25/15 10:20 AM  Result Value Ref Range Status   WBC, UA None seen 0 - 5 /hpf Final   RBC, UA None seen 0 - 2 /hpf Final   Epithelial Cells (non renal) None seen 0 - 10 /hpf Final   Renal Epithel, UA None seen None seen /hpf Final   Bacteria, UA None seen None seen/Few Final    RADIOLOGY:  No results found.  Follow up with PCP in 1 week.  Management plans discussed with the patient, family and they are in agreement.  CODE STATUS:     Code Status Orders  (From admission, onward)         Start     Ordered   02/02/19 0631  Do not attempt resuscitation (DNR)  Continuous    Question Answer Comment  In the event of cardiac or respiratory ARREST Do not call a "code blue"   In the event of cardiac or respiratory ARREST Do not perform Intubation, CPR, defibrillation or ACLS   In the event of cardiac or respiratory ARREST Use medication by any route, position, wound care, and other measures to relive pain and suffering. May use oxygen, suction and manual treatment of airway obstruction as needed for comfort.   Comments RN may pronounce      02/02/19 0631        Code Status History    Date Active Date Inactive Code Status Order ID Comments User Context   08/19/2017 2055 08/22/2017 2245 DNR [id number]  Nicholes Mango, MD Inpatient      TOTAL TIME TAKING CARE OF THIS PATIENT ON DAY OF DISCHARGE: more than 30 minutes.   Leia Alf Olesya Wike M.D on 02/04/2019 at 10:55 AM  Between 7am to 6pm - Pager - [phone]  After 6pm go to [email or web address] -  password [email or web address] [place] [facility]  Office  [phone]  CC: Primary care physician; [name], MD  Note: This dictation was prepared with Dragon dictation along with smaller phrase technology. Any transcriptional errors that result from this process are unintentional.

## 2019-02-04 NOTE — Discharge Instructions (Signed)
Resume diet and activity as before ° ° °

## 2019-02-04 NOTE — Progress Notes (Signed)
Patient complaining of seeing things, "upside down", stating it's the medication she was given. Discussed with Dr. Marcille Blanco, no new orders received, stated he will discuss with pharmacy. Will continue to monitor.

## 2019-02-04 NOTE — Plan of Care (Signed)

## 2019-02-08 NOTE — ED Provider Notes (Signed)
[facility] Emergency Department Provider Note ______________   First MD Initiated Contact with Patient 02/02/19 [phone]     (approximate)  I have reviewed the triage vital signs and the nursing notes.   HISTORY  Chief Complaint Loss of Vision and Altered Mental Status   HPI Cynthia Price is a 73 y.o. female with below list of chronic medical conditions including multiple sclerosis essential thrombocytosis hypertension CAD and diastolic CHF as well as macular degeneration presents to the emergency department complaining of acute loss of vision intermittently in her left eye.  EMS also reports altered mental status during their transport.  Patient also admits to urinary discomfort and frequency        Past Medical History:  Diagnosis Date   Anemia 2012   from labs at [facility]   Back pain    s/p hemilaminectomy with h/o herniated disc at L4L5   Blood clot in vein    behind left knee   BP (high blood pressure) 03/08/2014   Last Assessment & Plan:  Blood pressure has been controlled without significant dizzyness or associated fatigue. Taking antihypertensives as directed without difficulty.     CAD (coronary artery disease) 10/18/2011   Chronic diastolic heart failure ([place]) 09/07/2014   Last Assessment & Plan:  Edema and sob seemingly baseline    Collagen vascular disease ([place])    Depression    after husband died   DVT (deep venous thrombosis) ([place]) 05/14/2012   Essential thrombocytosis ([place])    Hyperlipidemia    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[place]) 03/05/2015   Hypotension 03/26/2013   IBS (irritable bowel syndrome)    Insomnia    Lupus anticoagulant positive    Macular degeneration    MS (multiple sclerosis) ([place])    Multiple sclerosis ([place]) 04/29/2008   Per neurology at [facility] 04/29/2008   Per Neurology at [facility]     RLS (restless legs syndrome)    Spinal stenosis    lumbar   Urinary retention    with high  post void residuals 2013, per [facility] uro    Patient Active Problem List   Diagnosis Date Noted   Neurogenic pain 12/17/2018   Palliative care encounter 12/13/2018   Shortness of breath 12/13/2018   Weakness generalized 12/13/2018   Spastic diplegia ([place]) 11/13/2018   Abnormal MRI, cervical spine (07/27/2018) 10/09/2018   Cervical central spinal stenosis 10/09/2018   Cervical foraminal stenosis 10/09/2018   Cervical facet arthropathy 10/09/2018   Cervicalgia 10/09/2018   Cervical facet syndrome (Bilateral) (R>L) 10/09/2018   Chronic upper extremity pain (Secondary Area of Pain) (Bilateral) (R>L) 09/17/2018   Long term current use of anticoagulant therapy (Xarelto) 09/17/2018   DDD (degenerative disc disease), cervical 09/17/2018   Chronic neck pain (Primary Area of Pain) (Bilateral) (R>L) 08/27/2018   Chronic low back pain [facility] Area of Pain) (Bilateral) (L>R) w/ sciatica (Bilateral) 08/27/2018   Chronic knee pain (Fourth Area of Pain) (Bilateral) (L>R) 08/27/2018   Chronic lower extremity pain (Bilateral) (L>R) 08/27/2018   Chronic pain syndrome 08/27/2018   Pharmacologic therapy 08/27/2018   Disorder of skeletal system 08/27/2018   Problems influencing health status 08/27/2018   Long term current use of opiate analgesic 08/27/2018   Degenerative cervical spinal stenosis 08/06/2018   Resides in skilled nursing facility 08/06/2018   Rash 06/19/2018   Muscle spasticity 05/17/2018   Inability to walk 05/17/2018   Mild protein-calorie malnutrition ([place]) 01/03/2018   Ulcer, skin, non-healing, limited  to breakdown of skin ([place]) 12/19/2017   Intractable pain 08/19/2017   Chronic deep vein thrombosis (DVT) of proximal vein of left lower extremity ([place]) 05/16/2017   Seizure-like activity ([place]) 04/20/2017   Bladder mass 01/20/2017   Encounter for general adult medical examination without abnormal findings 11/24/2016   Spondylosis of lumbar region  without myelopathy or radiculopathy 05/23/2016   Uterine procidentia 02/25/2016   Urinary retention with incomplete bladder emptying 12/22/2015   ET (essential thrombocythemia) ([place]) 12/22/2015   DS (disseminated sclerosis) ([place]) 09/15/2015   Rotator cuff tendinitis (Right) 06/19/2015   Back sprain 06/05/2015   Chest wall contusion 06/05/2015   Effusion of knee 05/22/2015   Arthritis of knee, degenerative 05/22/2015   Effusion of left knee 05/22/2015   Osteoarthritis of knee (Left) 05/22/2015   Contusion of shoulder 04/14/2015   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[place]) 03/05/2015   Mild pulmonary hypertension ([place]) 03/05/2015   Complete rotator cuff rupture of shoulder (Left) 02/05/2015   Infraspinatus tenosynovitis 02/05/2015   Complete tear of left rotator cuff 02/05/2015   Chronic diastolic heart failure ([place]) 09/07/2014   Deep vein thrombosis ([place]) 07/10/2014   History of deep vein thrombosis (DVT) of lower extremity 07/10/2014   HLD (hyperlipidemia) 03/08/2014   BP (high blood pressure) 03/08/2014   Hyperlipidemia 03/08/2014   Hypertension 03/08/2014   UTI (urinary tract infection) 02/05/2014   Cellulitis, toe 08/18/2013   Bradycardia 03/26/2013   Hypotension 03/26/2013   RLS (restless legs syndrome) 08/31/2012   Constipation 07/20/2012   Prolapse of urethra 07/20/2012   Frequent UTI 07/20/2012   Neurogenic dysfunction of the urinary bladder 07/20/2012   Vaginal atrophy 07/20/2012   Recurrent UTI 07/20/2012   Neuromuscular dysfunction of bladder 07/20/2012   Prolapse urethral mucosa 07/20/2012   DVT (deep venous thrombosis) ([place]) 05/14/2012   Risk for falls 03/16/2012   Lumbar canal stenosis 02/16/2012   Lumbosacral radiculitis 02/16/2012   Restless leg 01/20/2012   Multiple sclerosis, 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[place]) 01/20/2012   Restless legs syndrome (RLS) 01/20/2012   Urinary retention 12/04/2011   CAD (coronary artery  disease) 10/18/2011   Anemia 96/75/9163   Diastolic dysfunction 84/66/5993   Tachycardia 10/01/2011   Thrombocythemia, essential ([place]) 05/26/2011   Edema 05/26/2011   HYPERCHOLESTEROLEMIA 04/29/2010   Depression 04/29/2010   CYSTOCELE WITHOUT MENTION UTERINE PROLAPSE LAT 04/29/2010   UNS ADVRS EFF OTH RX MEDICINAL&BIOLOGICAL SBSTNC 04/29/2010   Depressive disorder, not elsewhere classified 04/29/2010   Vitamin D deficiency 04/29/2010   Multiple sclerosis ([place]) 04/29/2008   NEUROPATHY 04/29/2008   FATIGUE 04/29/2008    Past Surgical History:  Procedure Laterality Date   ANTERIOR LUMBAR FUSION  2012   APPENDECTOMY     CHOLECYSTECTOMY     CHOLECYSTECTOMY     LUMBAR DISC SURGERY     LUMBAR LAMINECTOMY     TONSILLECTOMY      Prior to Admission medications   Medication Sig Start Date End Date Taking? Authorizing Provider  AMPYRA 10 MG TB12 Take 10 mg by mouth 2 (two) times daily.  09/11/12  Yes [provider]  baclofen (LIORESAL) 10 MG tablet Take 1 tablet (10 mg total) by mouth 4 (four) times daily. 12/17/18 03/17/19 Yes Milinda Pointer, MD  calcium carbonate (CALCIUM 600) 600 MG TABS tablet Take 1 tablet (600 mg total) by mouth 2 (two) times daily with a meal. 12/17/18 03/17/19 Yes Milinda Pointer, MD  Cholecalciferol (VITAMIN D3) 125 MCG (5000 UT) CAPS Take 1 capsule (5,000 Units total) by mouth daily with breakfast.  Take along with calcium and magnesium. 12/17/18 03/17/19 Yes Milinda Pointer, MD  diclofenac sodium (VOLTAREN) 1 % GEL Apply 4 g topically 3 (three) times daily.    Yes [provider]  escitalopram (LEXAPRO) 5 MG tablet Take 5 mg by mouth daily.  04/23/18  Yes [provider]  gabapentin (NEURONTIN) 800 MG tablet Take 1 tablet (800 mg total) by mouth 4 (four) times daily. 12/17/18 03/17/19 Yes Milinda Pointer, MD  guaifenesin (ROBITUSSIN) 100 MG/5ML syrup Take 200 mg by mouth every 4 (four) hours as needed for cough.    Yes [provider]  hydroxyurea (HYDREA) 500 MG capsule Take one by mouth Monday, Tuesday, Wed, Thursday, Friday Patient taking differently: Take 500 mg by mouth every Monday, Tuesday, Wednesday, Thursday, and Friday.  05/10/13  Yes Tonia Ghent, MD  Magnesium 500 MG CAPS Take 1 capsule (500 mg total) by mouth 2 (two) times daily at 8 am and 10 pm. 12/17/18 03/17/19 Yes Milinda Pointer, MD  Melatonin 3 MG TABS Take 6 mg by mouth at bedtime as needed (insomnia).   Yes [provider]  metoCLOPramide (REGLAN) 5 MG tablet Take 5 mg by mouth 4 (four) times daily -  before meals and at bedtime.    Yes [provider]  Multiple Vitamin (MULTIVITAMIN) tablet Take 1 tablet by mouth daily.   Yes [provider]  ondansetron (ZOFRAN) 4 MG tablet Take 4 mg by mouth every 4 (four) hours as needed for nausea or vomiting.  07/24/18  Yes [provider]  oxyCODONE (OXY IR/ROXICODONE) 5 MG immediate release tablet Take 1 tablet (5 mg total) by mouth every 6 (six) hours as needed for up to 30 days for severe pain. Must last 30 days. Patient taking differently: Take 5 mg by mouth every 6 (six) hours. Must last 30 days. 01/16/19 02/15/19 Yes Milinda Pointer, MD  rOPINIRole (REQUIP) 2 MG tablet Take 2 mg by mouth 4 (four) times daily.   Yes [provider]  tiZANidine (ZANAFLEX) 4 MG tablet Take 1 tablet (4 mg total) by mouth every 8 (eight) hours as needed for muscle spasms. Patient taking differently: Take 4 mg by mouth 3 (three) times daily.  12/17/18 03/17/19 Yes Milinda Pointer, MD  traZODone (DESYREL) 50 MG tablet Take 50 mg by mouth at bedtime. 12/24/18  Yes [provider]  XARELTO 20 MG TABS tablet Take 20 mg by mouth daily with breakfast.  09/07/15  Yes [provider]  oxyCODONE (OXY IR/ROXICODONE) 5 MG immediate release tablet Take 1 tablet (5 mg total) by mouth every 6 (six) hours as needed for up to 30 days for severe pain. Must  Last 30 days. 02/15/19 03/17/19  Milinda Pointer, MD  oxyCODONE (OXY IR/ROXICODONE) 5 MG immediate release tablet Take 1 tablet (5 mg total) by mouth every 6 (six) hours as needed for up to 30 days for severe pain. Must last 30 days. 12/17/18 01/16/19  Milinda Pointer, MD    Allergies Atorvastatin  Family History  Problem Relation Age of Onset   Heart disease Mother    Thyroid cancer Mother    Ovarian cancer Mother    Alcohol abuse Father    Heart disease Father    Diabetes Sister    Diabetes Brother    Multiple sclerosis Paternal Aunt    Multiple sclerosis Cousin     Social History Social History   Tobacco Use   Smoking status: Never Smoker   Smokeless tobacco: Never Used  Substance  Use Topics   Alcohol use: No   Drug use: No    Review of Systems Constitutional: No fever/chills Eyes: Positive for acute intermittent loss of vision left eye ENT: No sore throat. Cardiovascular: Denies chest pain. Respiratory: Denies shortness of breath. Gastrointestinal: No abdominal pain.  No nausea, no vomiting.  No diarrhea.  No constipation. Genitourinary: Negative for dysuria. Musculoskeletal: Negative for neck pain.  Negative for back pain. Integumentary: Negative for rash. Neurological: Negative for headaches, focal weakness or numbness.  ____________________________________________   PHYSICAL EXAM:  VITAL SIGNS: ED Triage Vitals  Enc Vitals Group     BP 02/02/19 0115 (!) 90/52     Pulse Rate 02/02/19 0115 (!) 57     Resp 02/02/19 0115 14     Temp 02/02/19 0516 97.8 F (36.6 C)     Temp Source 02/02/19 0516 Oral     SpO2 02/02/19 0115 94 %     Weight 02/02/19 0116 47.6 kg (105 lb)     Height 02/02/19 0116 1.575 m (5\' 2" )     Head Circumference --      Peak Flow --      Pain Score 02/02/19 0115 0     Pain Loc --      Pain Edu? --      Excl. in [place]? --     Constitutional: Alert and oriented. Well appearing and in no acute distress. Eyes:  Conjunctivae are normal. PERRL. EOMI. Mouth/Throat: Mucous membranes are moist.  Oropharynx non-erythematous. Neck: No stridor.   Cardiovascular: Normal rate, regular rhythm. Good peripheral circulation. Grossly normal heart sounds. Respiratory: Normal respiratory effort.  No retractions. Lungs CTAB. Gastrointestinal: Soft and nontender. No distention.  Musculoskeletal: No lower extremity tenderness nor edema. No gross deformities of extremities. Neurologic:  Normal speech and language. No gross focal neurologic deficits are appreciated.  Skin:  Skin is warm, dry and intact. No rash noted. Psychiatric: Mood and affect are normal. Speech and behavior are normal.  ____________________________________________   LABS (all labs ordered are listed, but only abnormal results are displayed)  Labs Reviewed  CBC - Abnormal; Notable for the following components:      Result Value   RBC 3.16 (*)    Hemoglobin 9.8 (*)    HCT 32.5 (*)    MCV 102.8 (*)    All other components within normal limits  COMPREHENSIVE METABOLIC PANEL - Abnormal; Notable for the following components:   Glucose, Bld 115 (*)    BUN 24 (*)    Albumin 3.2 (*)    All other components within normal limits  URINALYSIS, COMPLETE (UACMP) WITH MICROSCOPIC - Abnormal; Notable for the following components:   Color, Urine YELLOW (*)    APPearance TURBID (*)    Protein, ur 30 (*)    Leukocytes,Ua LARGE (*)    WBC, UA >50 (*)    Bacteria, UA RARE (*)    All other components within normal limits  TSH - Abnormal; Notable for the following components:   TSH 6.072 (*)    All other components within normal limits   ____________________________________________  EKG  ED ECG REPORT I, Allen N Zuzu Befort, the attending physician, personally viewed and interpreted this ECG.   Date: 02/02/2019  EKG Time: 1:13 AM  Rate: 57  Rhythm: Sinus bradycardia  Axis: Normal  Intervals: Normal  ST&T Change:  None  ____________________________________________  RADIOLOGY I, [place] N Arnice Vanepps, personally viewed and evaluated these images (plain radiographs) as part of my medical decision making,  as well as reviewing the written report by the radiologist.  ED MD interpretation: CT head revealed no acute intracranial abnormality  Official radiology report(s): No results found.  ____________________________________________    Procedures   ____________________________________________   INITIAL IMPRESSION / MDM / [place] / ED COURSE  As part of my medical decision making, I reviewed the following data within the [place] NUMBER   73 year old female presented with above-stated history and physical exam concerning for possible MS exacerbation and as such patient given Decadron 10 mg IV with improvement of vision.  Patient noted to have a urinary tract infection as such was given ceftriaxone 1 g.  Patient with episodes of confusion while in the emergency department and a such patient discussed with Dr. Marcille Blanco for hospital admission further evaluation and management    ____________________________________________  FINAL CLINICAL IMPRESSION(S) / ED DIAGNOSES  Multiple sclerosis exacerbation Urinary tract infection   MEDICATIONS GIVEN DURING THIS VISIT:  Medications  sodium chloride 0.9 % bolus 1,000 mL (0 mLs Intravenous Stopped 02/02/19 0351)  dexamethasone (DECADRON) injection 10 mg (10 mg Intravenous Given 02/02/19 0231)  cefTRIAXone (ROCEPHIN) 1 g in sodium chloride 0.9 % 100 mL IVPB (0 g Intravenous Stopped 02/02/19 0420)     ED Discharge Orders    None       Note:  This document was prepared using Dragon voice recognition software and may include unintentional dictation errors.   Gregor Hams, MD 02/08/19 [phone]

## 2019-02-13 ENCOUNTER — Ambulatory Visit: Payer: Medicare Other | Admitting: Neurology

## 2019-05-01 ENCOUNTER — Telehealth: Payer: Self-pay | Admitting: *Deleted

## 2019-05-01 NOTE — Telephone Encounter (Signed)
Called, LVM for pt [phone]. Asked her to call back about appt tomorrow.  Tried Caleen Jobs (on [place]) at [phone]. LVM for him to all office back as well about converting appt to virtual visit

## 2019-05-02 ENCOUNTER — Encounter: Payer: Self-pay | Admitting: Neurology

## 2019-05-02 ENCOUNTER — Ambulatory Visit (INDEPENDENT_AMBULATORY_CARE_PROVIDER_SITE_OTHER): Payer: Medicare Other | Admitting: Neurology

## 2019-05-02 ENCOUNTER — Other Ambulatory Visit: Payer: Self-pay

## 2019-05-02 DIAGNOSIS — G4700 Insomnia, unspecified: Secondary | ICD-10-CM

## 2019-05-02 DIAGNOSIS — Z789 Other specified health status: Secondary | ICD-10-CM

## 2019-05-02 DIAGNOSIS — G801 Spastic diplegic cerebral palsy: Secondary | ICD-10-CM

## 2019-05-02 DIAGNOSIS — R262 Difficulty in walking, not elsewhere classified: Secondary | ICD-10-CM | POA: Diagnosis not present

## 2019-05-02 DIAGNOSIS — G35 Multiple sclerosis: Secondary | ICD-10-CM | POA: Diagnosis not present

## 2019-05-02 DIAGNOSIS — M62838 Other muscle spasm: Secondary | ICD-10-CM

## 2019-05-02 DIAGNOSIS — N319 Neuromuscular dysfunction of bladder, unspecified: Secondary | ICD-10-CM

## 2019-05-02 NOTE — Addendum Note (Signed)
Addended by: Hope Pigeon on: 05/02/2019 01:55 PM   Modules accepted: Orders

## 2019-05-02 NOTE — Telephone Encounter (Signed)
Tried calling pt number, went to VM. Did not leave message. Tried son, [name] again. Was able to speak with him. He says mother now in nursing home: [facility]  in [place], [place] Address: [address], [place], [place] [address] Phone: [phone]. Son not sure if they knew about pt appt today with Dr. Felecia Shelling. He has not spoken to mother in a few days d/t her phone having issues.  I called nursing home and spoke with nurse, [place]. She transferred me to [place]. Agreeable to virtual visit today at 230pm with Dr. Felecia Shelling. I emailed link to [email or web address]. Confirmed receipt. She will fax updated med list to [phone].

## 2019-05-02 NOTE — Progress Notes (Signed)
[facility]  PATIENT: Cynthia Price DOB: [birth year]  REFERRING DOCTOR OR PCP:  Frazier Richards SOURCE: patient, notes from [facility] ([facility], [place] [facility] [place], Imaging and lab reports, MRI images on CD PACS.     _________________________________   HISTORICAL  CHIEF COMPLAINT:  Chief Complaint  Patient presents with  . Multiple Sclerosis    SPMS without relapses  . Other    spasticity    HISTORY OF PRESENT ILLNESS:  Cynthia Price is a 73 yo woman with progressive MS.  Update 05/02/2019: Virtual Visit via Video Note I connected with Cynthia Price on 05/02/19 at  2:30 PM EDT by a video enabled telemedicine application and verified that I am speaking with the correct person.  I discussed the limitations of evaluation and management by telemedicine and the availability of in person appointments. The patient expressed understanding and agreed to proceed.  History of Present Illness: She is having more spasticity in her legs, left > right We had done Xeomin injections 11/13/18 (300 U left and 100 U right) but the benefit was mild and only for a couple weeks.   She is on baclofen 3-4 pills a day and it does not help her much.  She tolerates it well.  Her arms have felt weaker, especially the left.   She feeds herself with her right arm.    She is bed-bound and is in wheelchair out of bed.  She hasn't used a walker the past 6 months.  She has urinary incontinence.  A catheter was bothering her so it was removed and she uses diapers now.  Vision is about the same.  She does not feel sedated but is tired.   She is sleeping poorly at night.  She falls asleep easily but wakes up 3 hours later and then has trouble falling back asleep.  She feels cognition is doing the same.  She notes some depression.  No anxiety    Observations/Objective: She is a well-developed well-nourished woman in no acute distress.  The head is normocephalic and atraumatic.  Sclera are  anicteric.  Visible skin appears normal.  The neck has a good range of motion.  Pharynx and tongue have normal appearance.  She is alert and fully oriented with fluent speech and good attention, knowledge and memory.  Extraocular muscles are intact.  Facial strength is normal.  Palatal elevation and tongue protrusion are midline.  She appears to have normal strength in the arms.  Rapid alternating movements and finger-nose-finger are performed well.   Assessment and Plan: Multiple sclerosis ([place])  Spastic diplegia (HCC)  Muscle spasticity  Inability to walk  Resides in skilled nursing facility  Neurogenic dysfunction of the urinary bladder  Insomnia, unspecified type  1.  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  She will remain off of a disease modifying therapy at this time. 2.   Xeomin injections into the legs did not help her spasticity much.  Any benefit was very short-lived.  We will increase the baclofen to 20 mg 3 times daily.  If she has trouble tolerating it we will need to back off again. 3.   I talked with the nurse at the nursing home and we will see if she will qualify for physical therapy. 4.   Return to see me in 6 months or sooner if there are new or worsening neurologic symptoms.  Follow  Up Instructions: I discussed the assessment and treatment plan with the patient. The patient was provided an opportunity to ask questions and all were answered. The patient agreed with the plan and demonstrated an understanding of the instructions.    The patient was advised to call back or seek an in-person evaluation if the symptoms worsen or if the condition fails to improve as anticipated.  I provided 25 minutes of non-face-to-face time during this encounter.  _______________________________________________   Update 11/13/2018: She reports that her spasticity is a little better on the oral baclofen  10 mg p.o. 3 times daily.  She tolerates it well.  We discussed going up a little bit on the dose.  She still has severe spasticity in the legs, left greater than right.  At the last visit we discussed Botox or Xeomin injections for the spasticity.  She would like to proceed.  I also had a long discussion with her about rehab services and living arrangements.  She is currently in a high level assisted living or a skilled nursing facility she was receiving some physical therapy but is not receiving it now.    Attendents do not always help her in time to use the bathroom.  She also has neck pain radiating down the arms to the fingers (uncear which fingers involved more).   Currently more to the left side but more to the right some days.     She is set up for an ESI.  In her current wheelchair, her neck is flexed most of the day due to poor ability to keep her head upright.  From 05/17/2018: She is a 73 year old woman who was diagnose with MS about 35 years ago.   She initially had tingling in her left leg.   As she also had a hematologic issue, she was not sent to a neurologist initially.   Then one day, she had weakness in her left arm for one day that was better the next day.   She also had intermittent diplopia.     She did well for many years and 15 years ago had no limp and was able to climb a ladder.   Over the next 5-10 years, she developed a mild limp but could still climb stairs.   She had further progressive gait issues mostly due to left leg weakness and spasticity over the next few years.    In 2012, she was able to walk a block without a cane.   he has had multiple operations in her lower back and has PLIF at [place].    After her last lumbar operation in 2013, she had a lot more difficulty with her gait.   She had fallen before the surgery. After her surgery, gait was worse and she needed to use a walker.     She went from the walker to a chair about a year ago due to worsening left > right leg  weakness and spasticity.     She has left arm weakness on her left starting around 2013.      She also has a left rotator cuff tear.   She can transfer by herself to an even level but not up from the bed or to the bathroom (lift is needed).      In the past, she was on baclofen but it was stopped due to theoretical concern about sedation as also on opiate.    She is on tizanidine only at bedtime (4 mg).  Her legs draw up more at night from spasticity, left > right.     She also reports needing a catheter due to urinary incontinence (lives in SNF and they recommended the catheter).    She has reduce division out of her left eye and colors are desaturated out of her left eye.      She has trouble with sleep maintenance insomnia (occasioanlly sleep onset).   She wakes up after a couple hours and then has trouble falling back asleep.    She spends most of the time in bed aso is no longer fatigued.  PT was working with her to try to get back to a walker but now is only trying to help with transfers.     She feels more depressed over the past couple years.    She feels her cognition is doing well.   She is on Xarelto due to a DVT after a sternal fracture about 6 months ago.   Reportedly, her repeat U/S was normal.        The following MRI's were personally reviewed: MRI of the lumbar spine 01/19/2017 shows L3-L4 fusion, mild spinal stenosis at L2-L3, left hemilaminectomy at L4-L5 with mild spinal stenosis and bilateral foraminal and lateral recess stenosis, moderately severe left foraminal narrowing at L5-S1.  MRI of the brain 05/26/2016 personally compared to the MRI of the brain 06/19/2007.  There are multiple periventricular, juxtacortical and deep white matter foci in the brain, there are also a focus in the left thalamus and in the left middle cerebellar peduncle.  There has been mild to moderate progression between 2008 and 2017.  MRI of the cervical spine 03/10/2011.  There is a large focus adjacent to  C4 within the spinal cord and a smaller focus adjacent to C7-T1.  Mild degenerative changes are noted.  She has kyphosis.  Multiple records from [facility] neurology were reviewed and part of the summary above.  REVIEW OF SYSTEMS: Constitutional: No fevers, chills, sweats, or change in appetite.   She has insomnia Eyes: No visual changes, double vision, eye pain Ear, nose and throat: No hearing loss, ear pain, nasal congestion, sore throat Cardiovascular: No chest pain, palpitations Respiratory: No shortness of breath at rest or with exertion.   No wheezes GastrointestinaI: No nausea, vomiting, diarrhea, abdominal pain, fecal incontinence Genitourinary:Has indwelling catheter. Musculoskeletal: No neck pain, back pain Integumentary: No rash, pruritus, skin lesions Neurological: as above Psychiatric: Notes depression and mild anxiety Endocrine: No palpitations, diaphoresis, change in appetite, change in weigh or increased thirst Hematologic/Lymphatic: No anemia, purpura, petechiae. Allergic/Immunologic: No itchy/runny eyes, nasal congestion, recent allergic reactions, rashes  ALLERGIES: Allergies  Allergen Reactions  . Atorvastatin     Muscle aches at >40mg      HOME MEDICATIONS:  Current Outpatient Medications:  .  acetaminophen (TYLENOL) 325 MG tablet, Take 650 mg by mouth every 4 (four) hours as needed. Give 2 tablets by mouth every 4 hours prn for general discomfort. Do not exceed 3 grams of APAP/day from all sources, Disp: , Rfl:  .  AMPYRA 10 MG TB12, Take 10 mg by mouth 2 (two) times daily. , Disp: , Rfl:  .  baclofen (LIORESAL) 10 MG tablet, Take 1 tablet (10 mg total) by mouth 4 (four) times daily., Disp: 120 tablet, Rfl: 2 .  calcium carbonate (CALCIUM 600) 600 MG TABS tablet, Take 1 tablet (600 mg total) by mouth 2 (two) times daily with a meal., Disp: 180 tablet, Rfl: 0 .  Cholecalciferol (  VITAMIN D3) 125 MCG (5000 UT) CAPS, Take 1 capsule (5,000 Units total) by mouth daily  with breakfast. Take along with calcium and magnesium., Disp: 90 capsule, Rfl: 0 .  diclofenac sodium (VOLTAREN) 1 % GEL, Apply 4 g topically 3 (three) times daily. , Disp: , Rfl:  .  escitalopram (LEXAPRO) 10 MG tablet, Take 10 mg by mouth daily., Disp: , Rfl:  .  gabapentin (NEURONTIN) 800 MG tablet, Take 1 tablet (800 mg total) by mouth 4 (four) times daily., Disp: 120 tablet, Rfl: 2 .  guaifenesin (ROBITUSSIN) 100 MG/5ML syrup, Take 200 mg by mouth every 4 (four) hours as needed for cough., Disp: , Rfl:  .  hydroxyurea (HYDREA) 500 MG capsule, Take one by mouth Monday, Tuesday, Wed, Thursday, Friday (Patient taking differently: Take 500 mg by mouth every Monday, Tuesday, Wednesday, Thursday, and Friday. ), Disp: 90 capsule, Rfl: 3 .  Magnesium 500 MG CAPS, Take 1 capsule (500 mg total) by mouth 2 (two) times daily at 8 am and 10 pm., Disp: 180 capsule, Rfl: 0 .  Melatonin 3 MG TABS, Take 6 mg by mouth at bedtime as needed (insomnia)., Disp: , Rfl:  .  metoCLOPramide (REGLAN) 5 MG tablet, Take 5 mg by mouth. 1 tablet by mouth before meals and at bedtime., Disp: , Rfl:  .  Multiple Vitamin (MULTIVITAMIN) tablet, Take 1 tablet by mouth daily., Disp: , Rfl:  .  ondansetron (ZOFRAN) 4 MG tablet, Take 4 mg by mouth every 4 (four) hours as needed for nausea or vomiting. , Disp: , Rfl:  .  oxyCODONE (OXY IR/ROXICODONE) 5 MG immediate release tablet, Take 1 tablet (5 mg total) by mouth every 6 (six) hours as needed for up to 30 days for severe pain. Must Last 30 days., Disp: 120 tablet, Rfl: 0 .  oxyCODONE (OXY IR/ROXICODONE) 5 MG immediate release tablet, Take 1 tablet (5 mg total) by mouth every 6 (six) hours as needed for up to 30 days for severe pain. Must last 30 days. (Patient taking differently: Take 5 mg by mouth every 6 (six) hours. Must last 30 days.), Disp: 120 tablet, Rfl: 0 .  oxyCODONE (OXY IR/ROXICODONE) 5 MG immediate release tablet, Take 1 tablet (5 mg total) by mouth every 6 (six) hours as  needed for up to 30 days for severe pain. Must last 30 days., Disp: 120 tablet, Rfl: 0 .  rOPINIRole (REQUIP) 2 MG tablet, Take 2 mg by mouth 4 (four) times daily., Disp: , Rfl:  .  tiZANidine (ZANAFLEX) 4 MG tablet, Take 1 tablet (4 mg total) by mouth every 8 (eight) hours as needed for muscle spasms. (Patient taking differently: Take 4 mg by mouth 3 (three) times daily. ), Disp: 90 tablet, Rfl: 2 .  traZODone (DESYREL) 50 MG tablet, Take 50 mg by mouth at bedtime., Disp: , Rfl:  .  XARELTO 20 MG TABS tablet, Take 20 mg by mouth daily with breakfast. , Disp: , Rfl:   PAST MEDICAL HISTORY: Past Medical History:  Diagnosis Date  . Anemia 2012   from labs at [facility]  . Back pain    s/p hemilaminectomy with h/o herniated disc at L4L5  . Blood clot in vein    behind left knee  . BP (high blood pressure) 03/08/2014   Last Assessment & Plan:  Blood pressure has been controlled without significant dizzyness or associated fatigue. Taking antihypertensives as directed without difficulty.    Marland Kitchen CAD (coronary artery disease) 10/18/2011  . Chronic  diastolic heart failure ([place]) 09/07/2014   Last Assessment & Plan:  Edema and sob seemingly baseline   . Collagen vascular disease ([place])   . Depression    after husband died  . DVT (deep venous thrombosis) ([place]) 05/14/2012  . Essential thrombocytosis ([place])   . Hyperlipidemia   . 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 Hypotension 03/26/2013  . IBS (irritable bowel syndrome)   . Insomnia   . Lupus anticoagulant positive   . Macular degeneration   . MS (multiple sclerosis) ([place])   . Multiple sclerosis ([place]) 04/29/2008   Per neurolog[facility] at [facility]    . NEUROPATHY 04/29/2008   Per Neurology at [facility]    . RLS (restless legs syndrome)   . Spinal stenosis    lumbar  . Urinary retention    with high post void residuals 2013, per [facility] uro    PAST SURGICAL HISTORY: Past Surgical History:  Procedure Laterality Date  . ANTERIOR LUMBAR FUSION  2012   . APPENDECTOMY    . CHOLECYSTECTOMY    . CHOLECYSTECTOMY    . LUMBAR DISC SURGERY    . LUMBAR LAMINECTOMY    . TONSILLECTOMY      FAMILY HISTORY: Family History  Problem Relation Age of Onset  . Heart disease Mother   . Thyroid cancer Mother   . Ovarian cancer Mother   . Alcohol abuse Father   . Heart disease Father   . Diabetes Sister   . Diabetes Brother   . Multiple sclerosis Paternal Aunt   . Multiple sclerosis Cousin     SOCIAL HISTORY:  Social History   Socioeconomic History  . Marital status: Widowed    Spouse name: Not on file  . Number of children: 2  . Years of education: Not on file  . Highest education level: Not on file  Occupational History    Employer: Retired  Scientific laboratory technician  . Financial resource strain: Not on file  . Food insecurity:    Worry: Not on file    Inability: Not on file  . Transportation needs:    Medical: Not on file    Non-medical: Not on file  Tobacco Use  . Smoking status: Never Smoker  . Smokeless tobacco: Never Used  Substance and Sexual Activity  . Alcohol use: No  . Drug use: No  . Sexual activity: Not on file  Lifestyle  . Physical activity:    Days per week: Not on file    Minutes per session: Not on file  . Stress: Not on file  Relationships  . Social connections:    Talks on phone: Not on file    Gets together: Not on file    Attends religious service: Not on file    Active member of club or organization: Not on file    Attends meetings of clubs or organizations: Not on file    Relationship status: Not on file  . Intimate partner violence:    Fear of current or ex partner: Not on file    Emotionally abused: Not on file    Physically abused: Not on file    Forced sexual activity: Not on file  Other Topics Concern  . Not on file  Social History Narrative  . Not on file     PHYSICAL EXAM  There were no vitals filed for this visit.  There is no height or weight on file to calculate BMI.   General: The  patient is well-developed and well-nourished  woman in a wheelchair and in no acute distress  Eyes:  Funduscopic exam shows normal optic discs and retinal vessels.  Neck: The neck is supple, no carotid bruits are noted.  The neck is nontender.  Cardiovascular: The heart has a regular rate and rhythm with a normal S1 and S2. There were no murmurs, gallops or rubs. Lungs are clear to auscultation.  Skin: Extremities are without significant edema.  Musculoskeletal:  Back is nontender  Neurologic Exam  Mental status: The patient is alert and oriented x 3 at the time of the examination. The patient has apparent normal recent and remote memory, with an apparently normal attention span and concentration ability.   Speech is normal.  Cranial nerves: Extraocular movements are full. Pupils are equal, round, and reactive to light and accomodation.  Visual fields are full.  Facial symmetry is present. There is good facial sensation to soft touch bilaterally.Facial strength is normal.  Trapezius and sternocleidomastoid strength is normal. No dysarthria is noted.  The tongue is midline, and the patient has symmetric elevation of the soft palate. No obvious hearing deficits are noted.  Motor: Muscle bulk was normal.  She had normal muscle tone in the right arm, mildly increased muscle tone in the left arm, moderately increased muscle tone in the right leg and severely increased muscle tone with contractures of the hamstring in the left leg.   Strength was 5/5 in the right arm, 4 to 4+/5 in the left arm,  4-/5 in the right leg and 2-3/5 in the left leg  Sensory: Sensory testing was intact to the arm but she had reduced sensation to vibration and touch in the left leg.  Coordination: Cerebellar testing reveals good right finger-nose-finger reduced left finger-nose-finger and very reduced right heel-to-shin.  She could not do left heel-to-shin  Gait and station: She cannot stand or walk.  Reflexes: Deep  tendon reflexes are increased, legs greater than arms and left greater than right.  There was clonus at the ankles and cross adductor responses, more intense on the left.  The plantar response was extensor on the left.    DIAGNOSTIC DATA (LABS, IMAGING, TESTING) - I reviewed patient records, labs, notes, testing and imaging myself where available.  Lab Results  Component Value Date   WBC 6.0 02/02/2019   HGB 9.8 (L) 02/02/2019   HCT 32.5 (L) 02/02/2019   MCV 102.8 (H) 02/02/2019   PLT 329 02/02/2019      Component Value Date/Time   NA 143 02/02/2019 0121   NA 142 08/27/2018 1545   NA 143 11/02/2014 1914   K 4.3 02/02/2019 0121   K 3.8 11/02/2014 1914   CL 105 02/02/2019 0121   CL 107 11/02/2014 1914   CO2 31 02/02/2019 0121   CO2 31 11/02/2014 1914   GLUCOSE 115 (H) 02/02/2019 0121   GLUCOSE 97 11/02/2014 1914   BUN 24 (H) 02/02/2019 0121   BUN 22 08/27/2018 1545   BUN 14 11/02/2014 1914   CREATININE 0.92 02/02/2019 0121   CREATININE 1.01 11/02/2014 1914   CALCIUM 9.1 02/02/2019 0121   CALCIUM 8.7 11/02/2014 1914   PROT 6.5 02/02/2019 0121   PROT 6.8 08/27/2018 1545   PROT 6.8 11/02/2014 1914   ALBUMIN 3.2 (L) 02/02/2019 0121   ALBUMIN 4.4 08/27/2018 1545   ALBUMIN 3.4 11/02/2014 1914   AST 18 02/02/2019 0121   AST 29 11/02/2014 1914   ALT 10 02/02/2019 0121   ALT 26 11/02/2014 1914   ALKPHOS  51 02/02/2019 0121   ALKPHOS 81 11/02/2014 1914   BILITOT 0.5 02/02/2019 0121   BILITOT <0.2 08/27/2018 1545   BILITOT 0.3 11/02/2014 1914   GFRNONAA >60 02/02/2019 0121   GFRNONAA 58 (L) 11/02/2014 1914   GFRNONAA >60 06/27/2014 1844   GFRAA >60 02/02/2019 0121   GFRAA >60 11/02/2014 1914   GFRAA >60 06/27/2014 1844   Lab Results  Component Value Date   CHOL 162 08/20/2017   HDL 72 08/20/2017   LDLCALC 80 08/20/2017   LDLDIRECT 114.8 11/17/2011   TRIG 49 08/20/2017   CHOLHDL 2.3 08/20/2017   No results found for: HGBA1C Lab Results  Component Value Date    VITAMINB12 492 08/27/2018   Lab Results  Component Value Date   TSH 6.072 (H) 02/02/2019       Richard A. Felecia Shelling, MD, PhD, FAAN Certified in Neurology, Clinical Neurophysiology, Sleep Medicine, Pain Medicine and Neuroimaging Director, [place] at [facility] [address], [place], [place] [address] [phone]

## 2019-05-02 NOTE — Telephone Encounter (Signed)
Received fax. Updated med list in epic from [facility].  Pharmacy: [facility]/[place], [place].

## 2019-05-07 ENCOUNTER — Non-Acute Institutional Stay: Payer: Medicare Other | Admitting: Student

## 2019-05-07 ENCOUNTER — Other Ambulatory Visit: Payer: Self-pay

## 2019-05-07 DIAGNOSIS — Z515 Encounter for palliative care: Secondary | ICD-10-CM

## 2019-05-07 NOTE — Progress Notes (Signed)
[facility] Pall[facility] Consult Note Telephone: [phone]  Fax: [phone]  PATIENT NAME: Cynthia Price DOB: 21-Nov-1946 MRN: [id number]  PRIMARY CARE PROVIDER:   [name], MD  REFERRING PROVIDER:  [name], MD [facility] [place], [place] [address]  RESPONSIBLE PARTY: Valaree Fresquez son [phone]   ASSESSMENT: Due to the COVID-19 crisis, this visit was done via telemedicine and it was initiated and consent by this patient and or family. Cynthia Price is sitting up to wheel chair. She is able to answer direct questions. Staff member Tiffany assisted with [employer] and discussed with staff nurse Crystal. We discussed ongoing goals of care and symptom management. Staff is encouraged to call as needs arise. Will call son to provide update on today's visit.        RECOMMENDATIONS and PLAN:  1. Code status: DNR. 2. Medical goals of therapy/symptom management: Palliative Medicine will continue to monitor for changes and declines and make recommendations as needed. Pain: continue current pain medications as ordered.  3. Discharge Planning: Cynthia Price will continue to reside at [facility].   Palliative Medicine will follow up in 8 weeks or sooner, if needed.   I spent 15 minutes providing this consultation,  from 1:30pm to 1:45pm. More than 50% of the time in this consultation was spent coordinating communication.   HISTORY OF PRESENT ILLNESS:  Cynthia Price is a 73 y.o. female with multiple medical problems including coronary artery disease, chronic diastolic congestive heart failure, hypertension, pulmonary vascular disease, lupus, multiple sclerosis, arthritis, neuropathy, restless leg syndrome, spinal stenosis, macular degeneration, hyperlipidemia, thrombocytosis, anemia, depression, vitamin D deficiency. Palliative Care was asked to help address goals of care; she is seen for follow up visit today.  Cynthia Price continues to reside at [facility] facility. She is out of bed daily to wheel chair. She denies pain. Her appetite varies. Her current weight is 108.4 pounds. Nurse Crystal reports patient had been complaining of discomfort due to foley; foley had been removed and she has been voiding without difficulty. She was seen by neurology on 6/4; her baclofen was increased to 20mg  TID. Patient states her melatonin was discontinued; nurse Crystal states she is still receiving trazodone 50mg  qhs but melatonin was discontinued due to increased somnolence.   CODE STATUS: DNR  PPS: 40% HOSPICE ELIGIBILITY/DIAGNOSIS: TBD  PAST MEDICAL HISTORY:  Past Medical History:  Diagnosis Date  . Anemia 2012   from labs at [facility]  . Back pain    s/p hemilaminectomy with h/o herniated disc at L4L5  . Blood clot in vein    behind left knee  . BP (high blood pressure) 03/08/2014   Last Assessment & Plan:  Blood pressure has been controlled without significant dizzyness or associated fatigue. Taking antihypertensives as directed without difficulty.    Marland Kitchen CAD (coronary artery disease) 10/18/2011  . Chronic diastolic heart failure ([place]) 09/07/2014   Last Assessment & Plan:  Edema and sob seemingly baseline   . Collagen vascular disease ([place])   . Depression    after husband died  . DVT (deep venous thrombosis) ([place]) 05/14/2012  . Essential thrombocytosis ([place])   . Hyperlipidemia   . 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 Hypotension 03/26/2013  . IBS (irritable bowel syndrome)   . Insomnia   . Lupus anticoagulant positive   . Macular degeneration   . MS (multiple sclerosis) ([place])   .  Multiple sclerosis ([place]) 04/29/2008   Per neurology at [facility]    . NEUROPATHY 04/29/2008   Per [facility]rology at [facility]    . RLS (restless legs syndrome)   . Spinal stenosis    lumbar  . Urinary retention    with high post void residuals 2013, per [facility] uro    SOCIAL HX:  Social History   Tobacco  Use  . Smoking status: Never Smoker  . Smokeless tobacco: Never Used  Substance Use Topics  . Alcohol use: No    ALLERGIES:  Allergies  Allergen Reactions  . Atorvastatin     Muscle aches at >40mg          PHYSICAL EXAM:   General: NAD, frail appearing, thin   Ezekiel Slocumb, NP

## 2019-06-04 ENCOUNTER — Telehealth: Payer: Self-pay

## 2019-06-04 NOTE — Telephone Encounter (Signed)
The MS is likely playing a large role though pain medication and muscle relaxants can sometimes worsen cognition.   If changes have progressed recently, should also check to make sure no UTI

## 2019-06-04 NOTE — Telephone Encounter (Signed)
Spoke with Hilda Blades in medical records. She is going to obtain copy of POA

## 2019-06-04 NOTE — Telephone Encounter (Signed)
Dr. Sater- what would you recommend? 

## 2019-06-04 NOTE — Telephone Encounter (Signed)
PTs daughter Cynthia Price call about her moms MS. She is the [facility] and wanted to know did we have it on file. She has left messages for medical records to call her back about it. I stated she is not on dpr to speak with but if we have the HCPOA that would be sufficient. Pt is at [facility] facility. She stated her mom cognitive issues have increase, she repeats herself, and she does not feed herself like she use to.She is doing less ADL daily. She wanted to know if this is MS related or medication related. Her number is [phone].

## 2019-06-04 NOTE — Telephone Encounter (Signed)
Received copy of POA. Will speak with Dr. Felecia Shelling on recommendations.

## 2019-06-05 NOTE — Telephone Encounter (Signed)
Called and spoke with daughter. Relayed Dr. Garth Bigness message. She will check with facility to see how she is doing recently. A couple weeks ago she had one episode where she woke up in the morning confused. She was fine the next day. Recommended they check for UTI if she is still having worsening cognition issues. She was started on Trazodone 11/2018 qhs.   She reports pt having a hard time feeding herself, opening silverware. Usually has help from visitors but no visitors allowed d/t covid. Daughter states when she speaks with nurses they relay they want pt to be as independent as possible and some will not help her. She is having a hard time using phone as well. Wondering if Dr. Felecia Shelling will write a letter explaining nature of her MS and specific help that she needs to give to care facility.

## 2019-06-06 ENCOUNTER — Encounter: Payer: Self-pay | Admitting: Neurology

## 2019-06-06 NOTE — Telephone Encounter (Addendum)
Faxed letter to [facility] [place]  in [place], [place] Address: [address] [place], [place] [address]. Phone: [phone] Fax: [phone]. Received fax confirmation.

## 2019-06-06 NOTE — Telephone Encounter (Signed)
Called, LVM for daughter letting her know Dr. Felecia Shelling wrote letter and I faxed to facility. Received fax confirmation. Asked her to call back if she has further questions/concerns.

## 2019-06-06 NOTE — Telephone Encounter (Signed)
I printed off a letter

## 2019-06-10 NOTE — Telephone Encounter (Signed)
Called daughter back. She forgot to talk about if her mother would be a good candidate for PT/OT. Mobility has declined since being at nursing facility. Nursing facility has PT/OT available.  Did discuss that MS is a progressive disease and mobility/spasticity can worsen over time. I will discuss with Dr. Felecia Shelling and call back.

## 2019-06-10 NOTE — Telephone Encounter (Signed)
Pt daughter [name] is requesting a call back  CB# [phone]

## 2019-06-10 NOTE — Telephone Encounter (Signed)
I called daughter to let her know we will write letter for PT/OT and fax over. She verbalized understanding.

## 2019-06-10 NOTE — Telephone Encounter (Signed)
Faxed printed/signed letter to [phone]. Received fax confirmation.

## 2019-06-10 NOTE — Telephone Encounter (Signed)
Spoke with Dr. Felecia Shelling. Ok to provide letter requesting PT/OT. Waiting on MD signature and then will fax letter

## 2019-08-23 ENCOUNTER — Other Ambulatory Visit: Payer: Self-pay

## 2019-08-23 ENCOUNTER — Non-Acute Institutional Stay: Payer: Medicare Other | Admitting: Adult Health Nurse Practitioner

## 2019-08-23 DIAGNOSIS — Z515 Encounter for palliative care: Secondary | ICD-10-CM

## 2019-08-23 NOTE — Progress Notes (Addendum)
Cynthia Price Consult Note Telephone: [phone]  Fax: [phone]  PATIENT NAME: Cynthia Price DOB: 07/23/1946 MRN: [id number]  PRIMARY CARE PROVIDER:   [name], MD  REFERRING PROVIDER:  [name], MD [facility] [place],  [place] [address]  RESPONSIBLE PARTY:   Cynthia Price, daughter [phone] Cynthia Price son [phone]   Due to the COVID-19 crisis, this visit was done via telemedicine and it was initiated and consent by this patient and or family.  This visit was facilitated by Cynthia Price, activities director at facility, via [place].      RECOMMENDATIONS and PLAN:  1.  Advanced Care Planning.  Patient is a DNR.  No changes made today.  Spoke with daughter via phone to give update.  Will reach back out to facility in about 8 weeks for follow up   2. MS.  Staff reports no changes.  She is bed and wheelchair bound.  Staff does get her up daily to wheelchair.  She has been participating in what limited activities they are doing at the facility right now.  Daughter did say that Dr. Felecia Price, neurologist, had ordered PT.  She is not currently involved in PT.  Did discuss restorative with daughter as an option if PT is limited in the facility at this time.  Continue follow up and recommendations by Dr. Felecia Price for MS management.  3.  Depression.  Staff reports that with the isolation she has been having some depression.  She is being followed by [facility] which offers talk therapy every 2-4 weeks. She also has psych services that managed her depression medications.  Staff reports that they are going to start offering more activities in October, but still on a limited basis with few participants for social distancing.  Encourage engaging the patient as much as possible in these activities to help with the depression of this isolation.  Continue psych services  Overall patient is stable.  Her  current weight is 105.  No reports of falls, infection, or hospitalizations since last visit.  I spent 20 minutes providing this consultation,  from 9:30 to 9:50. More than 50% of the time in this consultation was spent coordinating communication.   HISTORY OF PRESENT ILLNESS:  Cynthia Price is a 73 y.o. year old female with multiple medical problems including coronary artery disease, chronic diastolic congestive heart failure, hypertension, pulmonary vascular disease, lupus, multiple sclerosis, arthritis, neuropathy, restless leg syndrome, spinal stenosis, macular degeneration, hyperlipidemia, thrombocytosis, anemia, depression, vitamin D deficiency. Palliative Care was asked to help address goals of care.   CODE STATUS: DNR  PPS: 40% HOSPICE ELIGIBILITY/DIAGNOSIS: TBD  PAST MEDICAL HISTORY:  Past Medical History:  Diagnosis Date  . Anemia 2012   from labs at [facility]  . Back pain    s/p hemilaminectomy with h/o herniated disc at L4L5  . Blood clot in vein    behind left knee  . BP (high blood pressure) 03/08/2014   Last Assessment & Plan:  Blood pressure has been controlled without significant dizzyness or associated fatigue. Taking antihypertensives as directed without difficulty.    Marland Kitchen CAD (coronary artery disease) 10/18/2011  . Chronic diastolic heart failure ([place]) 09/07/2014   Last Assessment & Plan:  Edema and sob seemingly baseline   . Collagen vascular disease ([place])   . Depression    after husband died  . DVT (deep venous thrombosis) ([place]) 05/14/2012  . Essential thrombocytosis (  [place])   . Hyperlipidemia   . 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[place]) 03/05/2015  . Hypotension 03/26/2013  . IBS (irritable bowel syndrome)   . Insomnia   . Lupus anticoagulant positive   . Macular degeneration   . MS (multiple sclerosis) ([place])   . Multiple sclerosis ([place]) 04/29/2008   Per [facility]    . NEUROPATHY 04/29/2008   Per Neurology at [facility]    . RLS (restless legs syndrome)   .  Spinal stenosis    lumbar  . Urinary retention    with high post void residuals 2013, per [facility] uro    SOCIAL HX:  Social History   Tobacco Use  . Smoking status: Never Smoker  . Smokeless tobacco: Never Used  Substance Use Topics  . Alcohol use: No    ALLERGIES:  Allergies  Allergen Reactions  . Atorvastatin     Muscle aches at >40mg       PERTINENT MEDICATIONS:  Outpatient Encounter Medications as of 08/23/2019  Medication Sig  . acetaminophen (TYLENOL) 325 MG tablet Take 650 mg by mouth every 4 (four) hours as needed. Give 2 tablets by mouth every 4 hours prn for general discomfort. Do not exceed 3 grams of APAP/day from all sources  . AMPYRA 10 MG TB12 Take 10 mg by mouth 2 (two) times daily.   . baclofen (LIORESAL) 10 MG tablet Take 1 tablet (10 mg total) by mouth 4 (four) times daily.  . calcium carbonate (CALCIUM 600) 600 MG TABS tablet Take 1 tablet (600 mg total) by mouth 2 (two) times daily with a meal.  . Cholecalciferol (VITAMIN D3) 125 MCG (5000 UT) CAPS Take 1 capsule (5,000 Units total) by mouth daily with breakfast. Take along with calcium and magnesium.  Marland Kitchen diclofenac sodium (VOLTAREN) 1 % GEL Apply 4 g topically 3 (three) times daily.   Marland Kitchen escitalopram (LEXAPRO) 10 MG tablet Take 10 mg by mouth daily.  Marland Kitchen gabapentin (NEURONTIN) 800 MG tablet Take 1 tablet (800 mg total) by mouth 4 (four) times daily.  Marland Kitchen guaifenesin (ROBITUSSIN) 100 MG/5ML syrup Take 200 mg by mouth every 4 (four) hours as needed for cough.  . hydroxyurea (HYDREA) 500 MG capsule Take one by mouth Monday, Tuesday, Wed, Thursday, Friday (Patient taking differently: Take 500 mg by mouth every Monday, Tuesday, Wednesday, Thursday, and Friday. )  . Magnesium 500 MG CAPS Take 1 capsule (500 mg total) by mouth 2 (two) times daily at 8 am and 10 pm.  . Melatonin 3 MG TABS Take 6 mg by mouth at bedtime as needed (insomnia).  . metoCLOPramide (REGLAN) 5 MG tablet Take 5 mg by mouth. 1 tablet by mouth before meals  and at bedtime.  . Multiple Vitamin (MULTIVITAMIN) tablet Take 1 tablet by mouth daily.  . ondansetron (ZOFRAN) 4 MG tablet Take 4 mg by mouth every 4 (four) hours as needed for nausea or vomiting.   Marland Kitchen oxyCODONE (OXY IR/ROXICODONE) 5 MG immediate release tablet Take 1 tablet (5 mg total) by mouth every 6 (six) hours as needed for up to 30 days for severe pain. Must Last 30 days.  Marland Kitchen oxyCODONE (OXY IR/ROXICODONE) 5 MG immediate release tablet Take 1 tablet (5 mg total) by mouth every 6 (six) hours as needed for up to 30 days for severe pain. Must last 30 days. (Patient taking differently: Take 5 mg by mouth every 6 (six) hours. Must last 30 days.)  . oxyCODONE (OXY IR/ROXICODONE) 5 MG immediate release tablet Take 1  tablet (5 mg total) by mouth every 6 (six) hours as needed for up to 30 days for severe pain. Must last 30 days.  Marland Kitchen rOPINIRole (REQUIP) 2 MG tablet Take 2 mg by mouth 4 (four) times daily.  Marland Kitchen tiZANidine (ZANAFLEX) 4 MG tablet Take 1 tablet (4 mg total) by mouth every 8 (eight) hours as needed for muscle spasms. (Patient taking differently: Take 4 mg by mouth 3 (three) times daily. )  . traZODone (DESYREL) 50 MG tablet Take 50 mg by mouth at bedtime.  Alveda Reasons 20 MG TABS tablet Take 20 mg by mouth daily with breakfast.    No facility-administered encounter medications on file as of 08/23/2019.       Halil Rentz Jenetta Downer, NP

## 2019-10-16 ENCOUNTER — Telehealth: Payer: Self-pay | Admitting: Neurology

## 2019-10-16 NOTE — Telephone Encounter (Signed)
Pts daughter [name] ( not on [place]) is calling in requesting a call back to ask a question, she wouldn't give further details  CB# [phone]

## 2019-10-17 NOTE — Telephone Encounter (Addendum)
I contacted the pt's facility and spoke with PT Gerald Stabs. He states pt was evaluated for PT was was determined she was not a good candidate.  He reports the pt did participate in splinting for a short period of time but has stopped.  I contacted the pt's daughter and advised of this message via vm. Pt advised to call back if she had any further questions.

## 2019-10-17 NOTE — Telephone Encounter (Signed)
I contacted [name] back ( she is the pt's POA).  She reports the pt has not been started on physical therapy yet.  I reviewed the pt's chart and I see on 06/10/2019 that the order for PT was faxed to [facility]. I will call them and f/u on why this has not been done. Their # is [id number].

## 2019-10-28 ENCOUNTER — Telehealth: Payer: Self-pay | Admitting: Adult Health Nurse Practitioner

## 2019-10-28 NOTE — Telephone Encounter (Signed)
Spoke with daughter about PT for her mom.  Spoke with nurse at facility who stated that PT has been tried and that she is not a candidate.  Staff do ROM exercises and get her out of bed daily to keep her from getting contractures.  She is able to feed herself.  Updated daughter on this and she had no further questions.  States that her mom has been in good spirits lately and no new concerns reported by facility. Helon Wisinski K. Olena Heckle NP

## 2019-11-26 ENCOUNTER — Telehealth: Payer: Self-pay | Admitting: Neurology

## 2019-11-26 NOTE — Telephone Encounter (Signed)
Patient's daughter and POA Caren Griffins called requesting to speak to Dr. Garth Bigness nurse. She said the pt has contracted Covid, and it seems to be exacerbating her MS sxs. Would like a call back to discuss at [phone]

## 2019-11-26 NOTE — Telephone Encounter (Signed)
I contacted the pt and left a vm requesting a call back.

## 2019-11-27 NOTE — Telephone Encounter (Signed)
I spoke to the her daughter, Caren Griffins, who says over the last few months she has noticed decreased function in her hands.  She tested positive for COVID-19 two weeks ago but has only experienced mild symptoms. She resides at a long term care facility in [place] [facility] [phone]).  Her hands are so bad now that she is unable to use her phone.  Her daughter does also mention increased stress since being quarantined.

## 2019-11-27 NOTE — Telephone Encounter (Signed)
Per vo by Dr. Felecia Shelling, he does not feel her symptoms represent an exacerbation.  He does feel she would benefit from PT and OT once her quarantine is over.  I called [facility] [phone]) and spoke to [place].  Signed MD orders for PT & OT, after quarantine have been faxed and confirmed to [phone].  The patient's daughter, Caren Griffins, has also been notified of Dr. Garth Bigness orders.

## 2020-01-01 ENCOUNTER — Telehealth: Payer: Self-pay | Admitting: Adult Health Nurse Practitioner

## 2020-01-01 NOTE — Telephone Encounter (Signed)
Have reached out to Dr. Tonette Bihari office and left message with staff that facility no longer has contract with [facility] and that we will be discharging patient from our services.  Left contact info if have any further questions. Armani Brar K. Olena Heckle NP

## 2020-01-01 NOTE — Telephone Encounter (Signed)
Spoke with daughter about changes at facility and that Cynthia Price will no longer be working in the building and that we will be discharging her from our services.  Informed her that facility will have own resources for palliative/hospice and to ask facility for more information on these services if she would like to continue palliative services.  Daughter did state that her mother had COVID around Christmas time and has had more confusion and hopelessness since.  Have encouraged her to continue working with the facility for time with her mother. Slaton Reaser K. Olena Heckle NP

## 2020-01-21 ENCOUNTER — Telehealth: Payer: Self-pay | Admitting: Neurology

## 2020-01-21 DIAGNOSIS — G801 Spastic diplegic cerebral palsy: Secondary | ICD-10-CM

## 2020-01-21 DIAGNOSIS — G35 Multiple sclerosis: Secondary | ICD-10-CM

## 2020-01-21 DIAGNOSIS — M62838 Other muscle spasm: Secondary | ICD-10-CM

## 2020-01-21 NOTE — Addendum Note (Signed)
Addended by: Wyvonnia Lora on: 01/21/2020 05:28 PM   Modules accepted: Orders

## 2020-01-21 NOTE — Telephone Encounter (Signed)
Called daughter back. Relayed Dr. Garth Bigness message. She emailed Education officer, museum who forwarded request for [facility] nursing home to call and get appt scheduled with Dr. Felecia Shelling.   Advised we will place referral for PT/OT full evaluation. She verbalized understanding.

## 2020-01-21 NOTE — Telephone Encounter (Signed)
I called pt daughter back. She was in care plan meeting last week for her mother at her facility, [facility]. They reviewed request from Dr. Felecia Shelling made back in December for PT/OT. They did eval beginnging/middle of January. Daughter states they only reviewed her eating/ability to feed herself. They feel she is not a good candidate for PT/OT. Daughter reports she is having difficulty even opening her smart phone with her hands. She can feed herself certain foods, they are pureeing all her food. Unable to do a lot of activities independently. She is having worsening double vision and memory loss. She is going to have nursing home call our office to schedule appt. Wondering if Dr. Felecia Shelling can request full eval of PT/OT. She feels mother is declining.

## 2020-01-21 NOTE — Telephone Encounter (Signed)
We can schedule follow-up.  Additionally we can request PT and OT full evaluation

## 2020-01-21 NOTE — Telephone Encounter (Signed)
Caren Griffins [place](daughter) has called re:PT/OT for pt again.  Daughter feels the facility is not reporting information to Dr Felecia Shelling to warrant the need of PT/OT for pt.  Daughter is asking for a call from RN to discuss further

## 2020-02-27 ENCOUNTER — Telehealth: Payer: Self-pay | Admitting: Neurology

## 2020-02-27 NOTE — Telephone Encounter (Signed)
Cyril Mourning Gibson(daughter)is asking if there has been any response from [facility] re: the PT and OT for patient, daughter has not heard anything and patient is telling her she has not seen anyone.  Please call

## 2020-02-27 NOTE — Telephone Encounter (Signed)
Cynthia Price, can you f/u with [facility]? I see referral was sent to them. Thank you

## 2020-03-02 NOTE — Telephone Encounter (Signed)
Called and spoke [place] asked her about patient's PT and OT Cynthia Price asked me to refax the order . I called granddaughter and she is aware.

## 2020-03-02 NOTE — Telephone Encounter (Signed)
Re faxed Order  [phone]

## 2020-04-14 ENCOUNTER — Telehealth: Payer: Self-pay | Admitting: Neurology

## 2020-04-14 NOTE — Telephone Encounter (Addendum)
Michelle called and LVM for Cynthia Price to let her know it would be best for pt to come in for appt since last visit 04/2019 and that was a VV. Advised her to call back if this is going to be an issue.

## 2020-04-14 NOTE — Telephone Encounter (Signed)
Cynthia Price, Sorry hoping I do this correctly. This patient has a doxy visit set up and when I called to confirm it was a family friend Arbie Cookey [phone]. Lovena Le advised you weren't doing doxy visits any longer so Arbie Cookey will call scheduling tomorrow to determine if this is virtual or office. If possible could you advise and I will be more than happy to call the friend in the morning. The daughter is also coming in for the visit. Thank you Selinda Eon

## 2020-04-16 ENCOUNTER — Ambulatory Visit (INDEPENDENT_AMBULATORY_CARE_PROVIDER_SITE_OTHER): Payer: Medicare Other | Admitting: Neurology

## 2020-04-16 ENCOUNTER — Encounter: Payer: Self-pay | Admitting: Neurology

## 2020-04-16 ENCOUNTER — Other Ambulatory Visit: Payer: Self-pay

## 2020-04-16 VITALS — BP 60/35 | HR 61

## 2020-04-16 DIAGNOSIS — M25512 Pain in left shoulder: Secondary | ICD-10-CM | POA: Insufficient documentation

## 2020-04-16 DIAGNOSIS — Z789 Other specified health status: Secondary | ICD-10-CM

## 2020-04-16 DIAGNOSIS — G801 Spastic diplegic cerebral palsy: Secondary | ICD-10-CM | POA: Diagnosis not present

## 2020-04-16 DIAGNOSIS — N319 Neuromuscular dysfunction of bladder, unspecified: Secondary | ICD-10-CM | POA: Diagnosis not present

## 2020-04-16 DIAGNOSIS — G35 Multiple sclerosis: Secondary | ICD-10-CM

## 2020-04-16 NOTE — Progress Notes (Signed)
I called [facility]. They will fax medication list to [place] at [phone]. Aware pt being seen by MD now.  Called nursing home back and spoke with Lonn [place] to let her know we have not received med list yet. She took fax number personally and will fax over asap.

## 2020-04-16 NOTE — Progress Notes (Signed)
[facility]  PATIENT: Cynthia Price DOB: 1946/11/17  REFERRING DOCTOR OR PCP:  Frazier Richards SOURCE: patient, notes from [facility] ([facility], [place] and [place], Imaging and lab reports, MRI images on CD PACS.     _________________________________   HISTORICAL  CHIEF COMPLAINT:  Chief Complaint  Patient presents with  . Follow-up    RM 16. Last seen 05/02/2019. Transported by EMS from nursing home. Son, [name] in room with them. Lives at [facility].  . Multiple Sclerosis    Has PPMS-off DMT.     HISTORY OF PRESENT ILLNESS:  Cynthia Price is a 74 yo woman with progressive MS.  Update 05/02/2019 (virtual) She is having more spasticity in her legs, left > right We had done Xeomin injections 11/13/18 (300 U left and 100 U right) but the benefit was mild and only for a couple weeks.   She is on baclofen 3-4 pills a day and it does not help her much.  She tolerates it well.  Her arms have felt weaker, especially the left.   She feeds herself with her right arm.    She is bed-bound and is in wheelchair out of bed.  She hasn't used a walker the past 6 months.  She has urinary incontinence.  A catheter was bothering her so it was removed and she uses diapers now.  Vision is about the same.  She does not feel sedated but is tired.   She is sleeping poorly at night.  She falls asleep easily but wakes up 3 hours later and then has trouble falling back asleep.  She feels cognition is doing the same.  She notes some depression.  No anxiety    Update 11/13/2018: She reports that her spasticity is a little better on the oral baclofen 10 mg p.o. 3 times daily.  She tolerates it well.  We discussed going up a little bit on the dose.  She still has severe spasticity in the legs, left greater than right.  At the last visit we discussed Botox or Xeomin injections for the spasticity.  She would like to proceed.  I also had a long discussion with her about  rehab services and living arrangements.  She is currently in a high level assisted living or a skilled nursing facility she was receiving some physical therapy but is not receiving it now.    Attendents do not always help her in time to use the bathroom.  She also has neck pain radiating down the arms to the fingers (uncear which fingers involved more).   Currently more to the left side but more to the right some days.     She is set up for an ESI.  In her current wheelchair, her neck is flexed most of the day due to poor ability to keep her head upright.  From 05/17/2018: She is a 74 year old woman who was diagnose with MS about 35 years ago.   She initially had tingling in her left leg.   As she also had a hematologic issue, she was not sent to a neurologist initially.   Then one day, she had weakness in her left arm for one day that was better the next day.   She also had intermittent diplopia.     She did well for many years and 15 years ago had no limp and was able to climb a ladder.   Over the next 5-10 years, she developed a mild limp  but could still climb stairs.   She had further progressive gait issues mostly due to left leg weakness and spasticity over the next few years.    In 2012, she was able to walk a block without a cane.   he has had multiple operations in her lower back and has PLIF at [place].    After her last lumbar operation in 2013, she had a lot more difficulty with her gait.   She had fallen before the surgery. After her surgery, gait was worse and she needed to use a walker.     She went from the walker to a chair about a year ago due to worsening left > right leg weakness and spasticity.     She has left arm weakness on her left starting around 2013.      She also has a left rotator cuff tear.   She can transfer by herself to an even level but not up from the bed or to the bathroom (lift is needed).      In the past, she was on baclofen but it was stopped due to theoretical concern  about sedation as also on opiate.    She is on tizanidine only at bedtime (4 mg).    Her legs draw up more at night from spasticity, left > right.     She also reports needing a catheter due to urinary incontinence (lives in SNF and they recommended the catheter).    She has reduce division out of her left eye and colors are desaturated out of her left eye.      She has trouble with sleep maintenance insomnia (occasioanlly sleep onset).   She wakes up after a couple hours and then has trouble falling back asleep.    She spends most of the time in bed aso is no longer fatigued.  PT was working with her to try to get back to a walker but now is only trying to help with transfers.     She feels more depressed over the past couple years.    She feels her cognition is doing well.   She is on Xarelto due to a DVT after a sternal fracture about 6 months ago.   Reportedly, her repeat U/S was normal.        The following MRI's were personally reviewed: MRI of the lumbar spine 01/19/2017 shows L3-L4 fusion, mild spinal stenosis at L2-L3, left hemilaminectomy at L4-L5 with mild spinal stenosis and bilateral foraminal and lateral recess stenosis, moderately severe left foraminal narrowing at L5-S1.  MRI of the brain 05/26/2016 personally compared to the MRI of the brain 06/19/2007.  There are multiple periventricular, juxtacortical and deep white matter foci in the brain, there are also a focus in the left thalamus and in the left middle cerebellar peduncle.  There has been mild to moderate progression between 2008 and 2017.  MRI of the cervical spine 03/10/2011.  There is a large focus adjacent to C4 within the spinal cord and a smaller focus adjacent to C7-T1.  Mild degenerative changes are noted.  She has kyphosis.  Multiple [facility] from [facility] neurology were reviewed and part of the summary above.  REVIEW OF SYSTEMS: Constitutional: No fevers, chills, sweats, or change in appetite.   She has insomnia Eyes: No  visual changes, double vision, eye pain Ear, nose and throat: No hearing loss, ear pain, nasal congestion, sore throat Cardiovascular: No chest pain, palpitations Respiratory: No shortness of breath at rest  or with exertion.   No wheezes GastrointestinaI: No nausea, vomiting, diarrhea, abdominal pain, fecal incontinence Genitourinary:Has indwelling catheter. Musculoskeletal: No neck pain, back pain Integumentary: No rash, pruritus, skin lesions Neurological: as above Psychiatric: Notes depression and mild anxiety Endocrine: No palpitations, diaphoresis, change in appetite, change in weigh or increased thirst Hematologic/Lymphatic: No anemia, purpura, petechiae. Allergic/Immunologic: No itchy/runny eyes, nasal congestion, recent allergic reactions, rashes  ALLERGIES: Allergies  Allergen Reactions  . Atorvastatin     Muscle aches at >40mg      HOME MEDICATIONS:  Current Outpatient Medications:  .  acetaminophen (TYLENOL) 325 MG tablet, Take 650 mg by mouth every 4 (four) hours as needed. Give 2 tablets by mouth every 4 hours prn for general discomfort. Do not exceed 3 grams of APAP/day from all sources, Disp: , Rfl:  .  AMPYRA 10 MG TB12, Take 10 mg by mouth 2 (two) times daily. , Disp: , Rfl:  .  baclofen (LIORESAL) 10 MG tablet, Take 1 tablet (10 mg total) by mouth 4 (four) times daily., Disp: 120 tablet, Rfl: 2 .  calcium carbonate (CALCIUM 600) 600 MG TABS tablet, Take 1 tablet (600 mg total) by mouth 2 (two) times daily with a meal., Disp: 180 tablet, Rfl: 0 .  Cholecalciferol (VITAMIN D3) 125 MCG (5000 UT) CAPS, Take 1 capsule (5,000 Units total) by mouth daily with breakfast. Take along with calcium and magnesium., Disp: 90 capsule, Rfl: 0 .  diclofenac sodium (VOLTAREN) 1 % GEL, Apply 4 g topically 3 (three) times daily. , Disp: , Rfl:  .  escitalopram (LEXAPRO) 10 MG tablet, Take 10 mg by mouth daily., Disp: , Rfl:  .  gabapentin (NEURONTIN) 800 MG tablet, Take 1 tablet (800  mg total) by mouth 4 (four) times daily., Disp: 120 tablet, Rfl: 2 .  guaifenesin (ROBITUSSIN) 100 MG/5ML syrup, Take 200 mg by mouth every 4 (four) hours as needed for cough., Disp: , Rfl:  .  hydroxyurea (HYDREA) 500 MG capsule, Take one by mouth Monday, Tuesday, Wed, Thursday, Friday (Patient taking differently: Take 500 mg by mouth every Monday, Tuesday, Wednesday, Thursday, and Friday. ), Disp: 90 capsule, Rfl: 3 .  Magnesium 500 MG CAPS, Take 1 capsule (500 mg total) by mouth 2 (two) times daily at 8 am and 10 pm., Disp: 180 capsule, Rfl: 0 .  Melatonin 3 MG TABS, Take 6 mg by mouth at bedtime as needed (insomnia)., Disp: , Rfl:  .  metoCLOPramide (REGLAN) 5 MG tablet, Take 5 mg by mouth. 1 tablet by mouth before meals and at bedtime., Disp: , Rfl:  .  Multiple Vitamin (MULTIVITAMIN) tablet, Take 1 tablet by mouth daily., Disp: , Rfl:  .  ondansetron (ZOFRAN) 4 MG tablet, Take 4 mg by mouth every 4 (four) hours as needed for nausea or vomiting. , Disp: , Rfl:  .  oxyCODONE (OXY IR/ROXICODONE) 5 MG immediate release tablet, Take 1 tablet (5 mg total) by mouth every 6 (six) hours as needed for up to 30 days for severe pain. Must Last 30 days., Disp: 120 tablet, Rfl: 0 .  oxyCODONE (OXY IR/ROXICODONE) 5 MG immediate release tablet, Take 1 tablet (5 mg total) by mouth every 6 (six) hours as needed for up to 30 days for severe pain. Must last 30 days. (Patient taking differently: Take 5 mg by mouth every 6 (six) hours. Must last 30 days.), Disp: 120 tablet, Rfl: 0 .  oxyCODONE (OXY IR/ROXICODONE) 5 MG immediate release tablet, Take 1 tablet (5 mg  total) by mouth every 6 (six) hours as needed for up to 30 days for severe pain. Must last 30 days., Disp: 120 tablet, Rfl: 0 .  rOPINIRole (REQUIP) 2 MG tablet, Take 2 mg by mouth 4 (four) times daily., Disp: , Rfl:  .  tiZANidine (ZANAFLEX) 4 MG tablet, Take 1 tablet (4 mg total) by mouth every 8 (eight) hours as needed for muscle spasms. (Patient taking  differently: Take 4 mg by mouth 3 (three) times daily. ), Disp: 90 tablet, Rfl: 2 .  traZODone (DESYREL) 50 MG tablet, Take 50 mg by mouth at bedtime., Disp: , Rfl:  .  XARELTO 20 MG TABS tablet, Take 20 mg by mouth daily with breakfast. , Disp: , Rfl:   PAST MEDICAL HISTORY: Past Medical History:  Diagnosis Date  . Anemia 2012   from labs at [facility]  . Back pain    s/p hemilaminectomy with h/o herniated disc at L4L5  . Blood clot in vein    behind left knee  . BP (high blood pressure) 03/08/2014   Last Assessment & Plan:  Blood pressure has been controlled without significant dizzyness or associated fatigue. Taking antihypertensives as directed without difficulty.    Marland Kitchen CAD (coronary artery disease) 10/18/2011  . Chronic diastolic heart failure ([place]) 09/07/2014   Last Assessment & Plan:  Edema and sob seemingly baseline   . Collagen vascular disease ([place])   . Depression    after husband died  . DVT (deep venous thrombosis) ([place]) 05/14/2012  . Essential thrombocytosis ([place])   . Hyperlipidemia   . 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[place]) 03/05/2015  . Hypotension 03/26/2013  . IBS (irritable bowel syndrome)   . Insomnia   . Lupus anticoagulant positive   . Macular degeneration   . MS (multiple sclerosis) ([place])   . Multiple sclerosis ([place]) 04/29/2008   Per neurology [facility]    . NEUROPATHY 04/29/2008   Per [facility]rology at [facility]    . RLS (restless legs syndrome)   . Spinal stenosis    lumbar  . Urinary retention    with high post void residuals 2013, per [facility] uro    PAST SURGICAL HISTORY: Past Surgical History:  Procedure Laterality Date  . ANTERIOR LUMBAR FUSION  2012  . APPENDECTOMY    . CHOLECYSTECTOMY    . CHOLECYSTECTOMY    . LUMBAR DISC SURGERY    . LUMBAR LAMINECTOMY    . TONSILLECTOMY      FAMILY HISTORY: Family History  Problem Relation Age of Onset  . Heart disease Mother   . Thyroid cancer Mother   . Ovarian cancer Mother   . Alcohol abuse Father   . Heart  disease Father   . Diabetes Sister   . Diabetes Brother   . Multiple sclerosis Paternal Aunt   . Multiple sclerosis Cousin     SOCIAL HISTORY:  Social History   Socioeconomic History  . Marital status: Widowed    Spouse name: Not on file  . Number of children: 2  . Years of education: Not on file  . Highest education level: Not on file  Occupational History    Employer: Retired  Tobacco Use  . Smoking status: Never Smoker  . Smokeless tobacco: Never Used  Substance and Sexual Activity  . Alcohol use: No  . Drug use: No  . Sexual activity: Not on file  Other Topics Concern  . Not on file  Social History Narrative  . Not on file   Social  Determinants of Health   Financial Resource Strain:   . Difficulty of Paying Living Expenses:   Food Insecurity:   . Worried About Charity fundraiser in the Last Year:   . Arboriculturist in the Last Year:   Transportation Needs:   . Film/video editor (Medical):   Marland Kitchen Lack of Transportation (Non-Medical):   Physical Activity:   . Days of Exercise per Week:   . Minutes of Exercise per Session:   Stress:   . Feeling of Stress :   Social Connections:   . Frequency of Communication with Friends and Family:   . Frequency of Social Gatherings with Friends and Family:   . Attends Religious Services:   . Active Member of Clubs or Organizations:   . Attends Archivist Meetings:   Marland Kitchen Marital Status:   Intimate Partner Violence:   . Fear of Current or Ex-Partner:   . Emotionally Abused:   Marland Kitchen Physically Abused:   . Sexually Abused:      PHYSICAL EXAM  Vitals:   04/16/20 1501  BP: (!) 60/35  Pulse: 61  SpO2: 95%    There is no height or weight on file to calculate BMI.   General: The patient is well-developed and well-nourished woman in a stretcher and in no acute distress.  She has tenderness at left shoulder with passive ROM and mild tenderness over [facility] joint  Skin: Extremities are without significant edema.    She has bruising on arms  Musculoskeletal:  Back is nontender  Neurologic Exam  Mental status: The patient is alert and oriented to name and place.  She did repest herself some but answered questions well.      Speech is normal.  Cranial nerves: Extraocular movements are full.  There is good facial strength.  Trapezius and sternocleidomastoid strength is normal. No dysarthria is noted.   No obvious hearing deficits are noted.  Motor: Muscle bulk was normal.  She had normal muscle tone in the right arm, mildly increased muscle tone in the left arm, severe increased muscle tone in legs with contractures of the hamstring in the left leg and both ankles.   Strength was 5/5 in the right arm, 4/5 in the left arm,  2+/5 in the right leg and 2-/5 in the left leg  Sensory: Sensory testing was intact to the arm but she had reduced sensation to vibration and touch in the left leg.   Gait and station: She cannot stand or walk.  Reflexes: Deep tendon reflexes are increased, legs greater than arms and left greater than right.  There was clonus at the ankles and cross adductor responses, more intense on the left.      DIAGNOSTIC DATA (LABS, IMAGING, TESTING) - I reviewed patient records, labs, notes, testing and imaging myself where available.  Lab Results  Component Value Date   WBC 6.0 02/02/2019   HGB 9.8 (L) 02/02/2019   HCT 32.5 (L) 02/02/2019   MCV 102.8 (H) 02/02/2019   PLT 329 02/02/2019      Component Value Date/Time   NA 143 02/02/2019 0121   NA 142 08/27/2018 1545   NA 143 11/02/2014 1914   K 4.3 02/02/2019 0121   K 3.8 11/02/2014 1914   CL 105 02/02/2019 0121   CL 107 11/02/2014 1914   CO2 31 02/02/2019 0121   CO2 31 11/02/2014 1914   GLUCOSE 115 (H) 02/02/2019 0121   GLUCOSE 97 11/02/2014 1914   BUN 24 (  H) 02/02/2019 0121   BUN 22 08/27/2018 1545   BUN 14 11/02/2014 1914   CREATININE 0.92 02/02/2019 0121   CREATININE 1.01 11/02/2014 1914   CALCIUM 9.1 02/02/2019 0121    CALCIUM 8.7 11/02/2014 1914   PROT 6.5 02/02/2019 0121   PROT 6.8 08/27/2018 1545   PROT 6.8 11/02/2014 1914   ALBUMIN 3.2 (L) 02/02/2019 0121   ALBUMIN 4.4 08/27/2018 1545   ALBUMIN 3.4 11/02/2014 1914   AST 18 02/02/2019 0121   AST 29 11/02/2014 1914   ALT 10 02/02/2019 0121   ALT 26 11/02/2014 1914   ALKPHOS 51 02/02/2019 0121   ALKPHOS 81 11/02/2014 1914   BILITOT 0.5 02/02/2019 0121   BILITOT <0.2 08/27/2018 1545   BILITOT 0.3 11/02/2014 1914   GFRNONAA >60 02/02/2019 0121   GFRNONAA 58 (L) 11/02/2014 1914   GFRNONAA >60 06/27/2014 1844   GFRAA >60 02/02/2019 0121   GFRAA >60 11/02/2014 1914   GFRAA >60 06/27/2014 1844   Lab Results  Component Value Date   CHOL 162 08/20/2017   HDL 72 08/20/2017   LDLCALC 80 08/20/2017   LDLDIRECT 114.8 11/17/2011   TRIG 49 08/20/2017   CHOLHDL 2.3 08/20/2017   No results found for: HGBA1C Lab Results  Component Value Date   [id number] 08/27/2018   Lab Results  Component Value Date   TSH 6.072 (H) 02/02/2019    Multiple sclerosis ([place])  Spastic diplegia ([place])  Resides in skilled nursing facility  Neurogenic dysfunction of the urinary bladder  Left shoulder pain, unspecified chronicity  1.   She has advanced PPMS and is unlikely to get any benefit from DMT's for the MS.   2.   Shoulder pain is worse on the left.   I'll order PT and diclofenac gel.   3.   Spasticity is mildly worse.   Xeomin had not helped her legs.   She notes hand stiffness and reduced use of hands.  I'll order occupational therapy. 4.   BP is low and she is advised to try to get in more fluids.   Due to her poor use of arms, she does not always eat or drink well.   I wrote order to assist with meals.    5.   Increase baclofen to 09-06-09-20  rtc 1 year or sooner if new or worsening neurologic issues.    40-minute office visit with the majority of the time spent face-to-face for history and physical, discussion/counseling and decision-making.   Additional time with record review and documentation.   [employer] A. Felecia Shelling, MD, PhD, FAAN Certified in Neurology, Clinical Neurophysiology, Sleep Medicine, Pain Medicine and Neuroimaging Director, [place] at [facility] [address] [place], [place] [address] [phone]

## 2020-04-20 ENCOUNTER — Encounter: Payer: Self-pay | Admitting: *Deleted

## 2020-04-20 NOTE — Progress Notes (Signed)
Received medication list via fax from [facility]. Updated med list in epic according to [facility] faxed over.

## 2020-04-20 NOTE — Telephone Encounter (Signed)
Pt daughter called to provide [facility] fax# for orders to be sent [phone]

## 2020-04-22 NOTE — Telephone Encounter (Signed)
Noted PT/OT Orders have been resent [phone]

## 2020-05-18 ENCOUNTER — Inpatient Hospital Stay
Admission: EM | Admit: 2020-05-18 | Discharge: 2020-06-03 | DRG: 871 | Disposition: A | Discharge status: Hospice | Payer: Medicare Other | Source: Skilled Nursing Facility | Attending: Family Medicine | Admitting: Family Medicine

## 2020-05-18 DIAGNOSIS — Z8249 Family history of ischemic heart disease and other diseases of the circulatory system: Secondary | ICD-10-CM

## 2020-05-18 DIAGNOSIS — I313 Pericardial effusion (noninflammatory): Secondary | ICD-10-CM | POA: Diagnosis present

## 2020-05-18 DIAGNOSIS — A4189 Other specified sepsis: Secondary | ICD-10-CM | POA: Diagnosis not present

## 2020-05-18 DIAGNOSIS — Z79899 Other long term (current) drug therapy: Secondary | ICD-10-CM

## 2020-05-18 DIAGNOSIS — Z66 Do not resuscitate: Secondary | ICD-10-CM | POA: Diagnosis present

## 2020-05-18 DIAGNOSIS — G9341 Metabolic encephalopathy: Secondary | ICD-10-CM | POA: Diagnosis not present

## 2020-05-18 DIAGNOSIS — R1312 Dysphagia, oropharyngeal phase: Secondary | ICD-10-CM | POA: Diagnosis present

## 2020-05-18 DIAGNOSIS — Z833 Family history of diabetes mellitus: Secondary | ICD-10-CM

## 2020-05-18 DIAGNOSIS — M25552 Pain in left hip: Secondary | ICD-10-CM | POA: Diagnosis not present

## 2020-05-18 DIAGNOSIS — G35D Multiple sclerosis, unspecified: Secondary | ICD-10-CM | POA: Diagnosis present

## 2020-05-18 DIAGNOSIS — Z7901 Long term (current) use of anticoagulants: Secondary | ICD-10-CM

## 2020-05-18 DIAGNOSIS — Z515 Encounter for palliative care: Secondary | ICD-10-CM | POA: Diagnosis not present

## 2020-05-18 DIAGNOSIS — Z9889 Other specified postprocedural states: Secondary | ICD-10-CM

## 2020-05-18 DIAGNOSIS — E785 Hyperlipidemia, unspecified: Secondary | ICD-10-CM | POA: Diagnosis present

## 2020-05-18 DIAGNOSIS — D6862 Lupus anticoagulant syndrome: Secondary | ICD-10-CM | POA: Diagnosis present

## 2020-05-18 DIAGNOSIS — I480 Paroxysmal atrial fibrillation: Secondary | ICD-10-CM | POA: Diagnosis present

## 2020-05-18 DIAGNOSIS — L89152 Pressure ulcer of sacral region, stage 2: Secondary | ICD-10-CM | POA: Diagnosis present

## 2020-05-18 DIAGNOSIS — K589 Irritable bowel syndrome without diarrhea: Secondary | ICD-10-CM | POA: Diagnosis present

## 2020-05-18 DIAGNOSIS — R627 Adult failure to thrive: Secondary | ICD-10-CM | POA: Diagnosis present

## 2020-05-18 DIAGNOSIS — Z86718 Personal history of other venous thrombosis and embolism: Secondary | ICD-10-CM

## 2020-05-18 DIAGNOSIS — E876 Hypokalemia: Secondary | ICD-10-CM | POA: Diagnosis present

## 2020-05-18 DIAGNOSIS — Z681 Body mass index (BMI) 19 or less, adult: Secondary | ICD-10-CM

## 2020-05-18 DIAGNOSIS — Z981 Arthrodesis status: Secondary | ICD-10-CM

## 2020-05-18 DIAGNOSIS — A419 Sepsis, unspecified organism: Secondary | ICD-10-CM | POA: Diagnosis present

## 2020-05-18 DIAGNOSIS — Z20822 Contact with and (suspected) exposure to covid-19: Secondary | ICD-10-CM | POA: Diagnosis present

## 2020-05-18 DIAGNOSIS — I2510 Atherosclerotic heart disease of native coronary artery without angina pectoris: Secondary | ICD-10-CM | POA: Diagnosis present

## 2020-05-18 DIAGNOSIS — R195 Other fecal abnormalities: Secondary | ICD-10-CM | POA: Diagnosis present

## 2020-05-18 DIAGNOSIS — J189 Pneumonia, unspecified organism: Secondary | ICD-10-CM | POA: Diagnosis not present

## 2020-05-18 DIAGNOSIS — F0390 Unspecified dementia without behavioral disturbance: Secondary | ICD-10-CM | POA: Diagnosis present

## 2020-05-18 DIAGNOSIS — Z820 Family history of epilepsy and other diseases of the nervous system: Secondary | ICD-10-CM

## 2020-05-18 DIAGNOSIS — L8990 Pressure ulcer of unspecified site, unspecified stage: Secondary | ICD-10-CM | POA: Diagnosis present

## 2020-05-18 DIAGNOSIS — F329 Major depressive disorder, single episode, unspecified: Secondary | ICD-10-CM | POA: Diagnosis present

## 2020-05-18 DIAGNOSIS — I110 Hypertensive heart disease with heart failure: Secondary | ICD-10-CM | POA: Diagnosis present

## 2020-05-18 DIAGNOSIS — D638 Anemia in other chronic diseases classified elsewhere: Secondary | ICD-10-CM | POA: Diagnosis present

## 2020-05-18 DIAGNOSIS — Z9049 Acquired absence of other specified parts of digestive tract: Secondary | ICD-10-CM

## 2020-05-18 DIAGNOSIS — Y95 Nosocomial condition: Secondary | ICD-10-CM | POA: Diagnosis present

## 2020-05-18 DIAGNOSIS — E2740 Unspecified adrenocortical insufficiency: Secondary | ICD-10-CM | POA: Diagnosis present

## 2020-05-18 DIAGNOSIS — G8929 Other chronic pain: Secondary | ICD-10-CM | POA: Diagnosis present

## 2020-05-18 DIAGNOSIS — J9601 Acute respiratory failure with hypoxia: Secondary | ICD-10-CM | POA: Diagnosis present

## 2020-05-18 DIAGNOSIS — I959 Hypotension, unspecified: Secondary | ICD-10-CM | POA: Diagnosis present

## 2020-05-18 DIAGNOSIS — G629 Polyneuropathy, unspecified: Secondary | ICD-10-CM | POA: Diagnosis present

## 2020-05-18 DIAGNOSIS — Z888 Allergy status to other drugs, medicaments and biological substances status: Secondary | ICD-10-CM

## 2020-05-18 DIAGNOSIS — G35 Multiple sclerosis: Secondary | ICD-10-CM | POA: Diagnosis present

## 2020-05-18 DIAGNOSIS — R6521 Severe sepsis with septic shock: Secondary | ICD-10-CM | POA: Diagnosis present

## 2020-05-18 DIAGNOSIS — I471 Supraventricular tachycardia: Secondary | ICD-10-CM | POA: Diagnosis present

## 2020-05-18 DIAGNOSIS — J90 Pleural effusion, not elsewhere classified: Secondary | ICD-10-CM

## 2020-05-18 DIAGNOSIS — I5033 Acute on chronic diastolic (congestive) heart failure: Secondary | ICD-10-CM | POA: Diagnosis present

## 2020-05-18 DIAGNOSIS — Z8041 Family history of malignant neoplasm of ovary: Secondary | ICD-10-CM

## 2020-05-18 DIAGNOSIS — D649 Anemia, unspecified: Secondary | ICD-10-CM

## 2020-05-18 DIAGNOSIS — N3001 Acute cystitis with hematuria: Secondary | ICD-10-CM | POA: Diagnosis present

## 2020-05-18 DIAGNOSIS — Z811 Family history of alcohol abuse and dependence: Secondary | ICD-10-CM

## 2020-05-18 DIAGNOSIS — I358 Other nonrheumatic aortic valve disorders: Secondary | ICD-10-CM | POA: Diagnosis present

## 2020-05-18 DIAGNOSIS — D539 Nutritional anemia, unspecified: Secondary | ICD-10-CM | POA: Diagnosis present

## 2020-05-18 DIAGNOSIS — G2581 Restless legs syndrome: Secondary | ICD-10-CM | POA: Diagnosis present

## 2020-05-18 DIAGNOSIS — Z808 Family history of malignant neoplasm of other organs or systems: Secondary | ICD-10-CM

## 2020-05-18 DIAGNOSIS — R0602 Shortness of breath: Secondary | ICD-10-CM

## 2020-05-18 DIAGNOSIS — J869 Pyothorax without fistula: Secondary | ICD-10-CM | POA: Diagnosis present

## 2020-05-18 DIAGNOSIS — I272 Pulmonary hypertension, unspecified: Secondary | ICD-10-CM | POA: Diagnosis present

## 2020-05-18 DIAGNOSIS — E43 Unspecified severe protein-calorie malnutrition: Secondary | ICD-10-CM | POA: Diagnosis present

## 2020-05-19 ENCOUNTER — Encounter: Payer: Self-pay | Admitting: Emergency Medicine

## 2020-05-19 ENCOUNTER — Emergency Department: Payer: Medicare Other

## 2020-05-19 ENCOUNTER — Other Ambulatory Visit: Payer: Self-pay

## 2020-05-19 DIAGNOSIS — J189 Pneumonia, unspecified organism: Secondary | ICD-10-CM | POA: Diagnosis present

## 2020-05-19 DIAGNOSIS — D649 Anemia, unspecified: Secondary | ICD-10-CM | POA: Diagnosis not present

## 2020-05-19 DIAGNOSIS — Z20822 Contact with and (suspected) exposure to covid-19: Secondary | ICD-10-CM | POA: Diagnosis present

## 2020-05-19 DIAGNOSIS — J690 Pneumonitis due to inhalation of food and vomit: Secondary | ICD-10-CM | POA: Diagnosis not present

## 2020-05-19 DIAGNOSIS — E43 Unspecified severe protein-calorie malnutrition: Secondary | ICD-10-CM | POA: Diagnosis present

## 2020-05-19 DIAGNOSIS — I471 Supraventricular tachycardia: Secondary | ICD-10-CM | POA: Diagnosis present

## 2020-05-19 DIAGNOSIS — L8995 Pressure ulcer of unspecified site, unstageable: Secondary | ICD-10-CM | POA: Diagnosis not present

## 2020-05-19 DIAGNOSIS — Z66 Do not resuscitate: Secondary | ICD-10-CM | POA: Diagnosis present

## 2020-05-19 DIAGNOSIS — A4189 Other specified sepsis: Secondary | ICD-10-CM | POA: Diagnosis present

## 2020-05-19 DIAGNOSIS — K589 Irritable bowel syndrome without diarrhea: Secondary | ICD-10-CM | POA: Diagnosis present

## 2020-05-19 DIAGNOSIS — Z888 Allergy status to other drugs, medicaments and biological substances status: Secondary | ICD-10-CM | POA: Diagnosis not present

## 2020-05-19 DIAGNOSIS — D6862 Lupus anticoagulant syndrome: Secondary | ICD-10-CM | POA: Diagnosis present

## 2020-05-19 DIAGNOSIS — A419 Sepsis, unspecified organism: Secondary | ICD-10-CM | POA: Diagnosis not present

## 2020-05-19 DIAGNOSIS — E2740 Unspecified adrenocortical insufficiency: Secondary | ICD-10-CM | POA: Diagnosis present

## 2020-05-19 DIAGNOSIS — R6521 Severe sepsis with septic shock: Secondary | ICD-10-CM | POA: Diagnosis present

## 2020-05-19 DIAGNOSIS — I5033 Acute on chronic diastolic (congestive) heart failure: Secondary | ICD-10-CM | POA: Diagnosis present

## 2020-05-19 DIAGNOSIS — N3001 Acute cystitis with hematuria: Secondary | ICD-10-CM | POA: Diagnosis present

## 2020-05-19 DIAGNOSIS — N390 Urinary tract infection, site not specified: Secondary | ICD-10-CM

## 2020-05-19 DIAGNOSIS — Z7189 Other specified counseling: Secondary | ICD-10-CM | POA: Diagnosis not present

## 2020-05-19 DIAGNOSIS — K922 Gastrointestinal hemorrhage, unspecified: Secondary | ICD-10-CM | POA: Diagnosis not present

## 2020-05-19 DIAGNOSIS — I82409 Acute embolism and thrombosis of unspecified deep veins of unspecified lower extremity: Secondary | ICD-10-CM | POA: Diagnosis not present

## 2020-05-19 DIAGNOSIS — J869 Pyothorax without fistula: Secondary | ICD-10-CM | POA: Diagnosis present

## 2020-05-19 DIAGNOSIS — J9601 Acute respiratory failure with hypoxia: Secondary | ICD-10-CM | POA: Diagnosis not present

## 2020-05-19 DIAGNOSIS — I110 Hypertensive heart disease with heart failure: Secondary | ICD-10-CM | POA: Diagnosis present

## 2020-05-19 DIAGNOSIS — G35 Multiple sclerosis: Secondary | ICD-10-CM | POA: Diagnosis present

## 2020-05-19 DIAGNOSIS — Z515 Encounter for palliative care: Secondary | ICD-10-CM | POA: Diagnosis not present

## 2020-05-19 DIAGNOSIS — I313 Pericardial effusion (noninflammatory): Secondary | ICD-10-CM | POA: Diagnosis present

## 2020-05-19 DIAGNOSIS — F0390 Unspecified dementia without behavioral disturbance: Secondary | ICD-10-CM | POA: Diagnosis present

## 2020-05-19 DIAGNOSIS — I9589 Other hypotension: Secondary | ICD-10-CM | POA: Diagnosis not present

## 2020-05-19 DIAGNOSIS — Z681 Body mass index (BMI) 19 or less, adult: Secondary | ICD-10-CM | POA: Diagnosis not present

## 2020-05-19 DIAGNOSIS — Y95 Nosocomial condition: Secondary | ICD-10-CM | POA: Diagnosis present

## 2020-05-19 DIAGNOSIS — Z86718 Personal history of other venous thrombosis and embolism: Secondary | ICD-10-CM | POA: Diagnosis not present

## 2020-05-19 DIAGNOSIS — G9341 Metabolic encephalopathy: Secondary | ICD-10-CM | POA: Diagnosis not present

## 2020-05-19 DIAGNOSIS — F329 Major depressive disorder, single episode, unspecified: Secondary | ICD-10-CM | POA: Diagnosis present

## 2020-05-19 LAB — COMPREHENSIVE METABOLIC PANEL
ALT: 10 U/L (ref 0–44)
AST: 18 U/L (ref 15–41)
Albumin: 2.7 g/dL — ABNORMAL LOW (ref 3.5–5.0)
Alkaline Phosphatase: 72 U/L (ref 38–126)
Anion gap: 8 (ref 5–15)
BUN: 23 mg/dL (ref 8–23)
CO2: 31 mmol/L (ref 22–32)
Calcium: 8.1 mg/dL — ABNORMAL LOW (ref 8.9–10.3)
Chloride: 98 mmol/L (ref 98–111)
Creatinine, Ser: 0.81 mg/dL (ref 0.44–1.00)
GFR calc Af Amer: >60 mL/min (ref >60)
GFR calc non Af Amer: >60 mL/min (ref >60)
Glucose, Bld: 138 mg/dL — ABNORMAL HIGH (ref 70–99)
Potassium: 4.2 mmol/L (ref 3.5–5.1)
Sodium: 137 mmol/L (ref 135–145)
Total Bilirubin: 0.6 mg/dL (ref 0.3–1.2)
Total Protein: 6.3 g/dL — ABNORMAL LOW (ref 6.5–8.1)

## 2020-05-19 LAB — CBC WITH DIFFERENTIAL/PLATELET
Abs Immature Granulocytes: 0.05 10*3/uL (ref 0.00–0.07)
Basophils Absolute: 0.1 10*3/uL (ref 0.0–0.1)
Basophils Relative: 1 %
Eosinophils Absolute: 0 10*3/uL (ref 0.0–0.5)
Eosinophils Relative: 0 %
HCT: 26.4 % — ABNORMAL LOW (ref 36.0–46.0)
Hemoglobin: 8.3 g/dL — ABNORMAL LOW (ref 12.0–15.0)
Immature Granulocytes: 1 %
Lymphocytes Relative: 8 %
Lymphs Abs: 0.8 10*3/uL (ref 0.7–4.0)
MCH: 32 pg (ref 26.0–34.0)
MCHC: 31.4 g/dL (ref 30.0–36.0)
MCV: 101.9 fL — ABNORMAL HIGH (ref 80.0–100.0)
Monocytes Absolute: 0.9 10*3/uL (ref 0.1–1.0)
Monocytes Relative: 9 %
Neutro Abs: 8.6 10*3/uL — ABNORMAL HIGH (ref 1.7–7.7)
Neutrophils Relative %: 81 %
Platelets: 302 10*3/uL (ref 150–400)
RBC: 2.59 MIL/uL — ABNORMAL LOW (ref 3.87–5.11)
RDW: 14.6 % (ref 11.5–15.5)
WBC: 10.4 10*3/uL (ref 4.0–10.5)
nRBC: 0 % (ref 0.0–0.2)

## 2020-05-19 LAB — HEMOGLOBIN AND HEMATOCRIT, BLOOD
HCT: 24 % — ABNORMAL LOW (ref 36.0–46.0)
Hemoglobin: 7.4 g/dL — ABNORMAL LOW (ref 12.0–15.0)

## 2020-05-19 LAB — CBC
HCT: 23.6 % — ABNORMAL LOW (ref 36.0–46.0)
Hemoglobin: 7.2 g/dL — ABNORMAL LOW (ref 12.0–15.0)
MCH: 31.4 pg (ref 26.0–34.0)
MCHC: 30.5 g/dL (ref 30.0–36.0)
MCV: 103.1 fL — ABNORMAL HIGH (ref 80.0–100.0)
Platelets: 260 10*3/uL (ref 150–400)
RBC: 2.29 MIL/uL — ABNORMAL LOW (ref 3.87–5.11)
RDW: 14.6 % (ref 11.5–15.5)
WBC: 9.7 10*3/uL (ref 4.0–10.5)
nRBC: 0 % (ref 0.0–0.2)

## 2020-05-19 LAB — URINALYSIS, COMPLETE (UACMP) WITH MICROSCOPIC
Bilirubin Urine: NEGATIVE
Glucose, UA: NEGATIVE mg/dL
Hgb urine dipstick: NEGATIVE
Ketones, ur: 5 mg/dL — AB
Nitrite: NEGATIVE
Protein, ur: 100 mg/dL — AB
Specific Gravity, Urine: 1.016 (ref 1.005–1.030)
Squamous Epithelial / HPF: NONE SEEN (ref 0–5)
WBC, UA: >50 WBC/hpf — ABNORMAL HIGH (ref 0–5)
pH: 8 (ref 5.0–8.0)

## 2020-05-19 LAB — PROCALCITONIN
Procalcitonin: 0.15 ng/mL
Procalcitonin: 0.14 ng/mL

## 2020-05-19 LAB — BASIC METABOLIC PANEL
Anion gap: 7 (ref 5–15)
BUN: 21 mg/dL (ref 8–23)
CO2: 30 mmol/L (ref 22–32)
Calcium: 8.4 mg/dL — ABNORMAL LOW (ref 8.9–10.3)
Chloride: 105 mmol/L (ref 98–111)
Creatinine, Ser: 0.66 mg/dL (ref 0.44–1.00)
GFR calc Af Amer: >60 mL/min (ref >60)
GFR calc non Af Amer: >60 mL/min (ref >60)
Glucose, Bld: 128 mg/dL — ABNORMAL HIGH (ref 70–99)
Potassium: 3.9 mmol/L (ref 3.5–5.1)
Sodium: 142 mmol/L (ref 135–145)

## 2020-05-19 LAB — CORTISOL-AM, BLOOD: Cortisol - AM: 11.1 ug/dL (ref 6.7–22.6)

## 2020-05-19 LAB — RESPIRATORY PANEL BY RT PCR (FLU A&B, COVID)
Influenza A by PCR: NEGATIVE
Influenza B by PCR: NEGATIVE
SARS Coronavirus 2 by RT PCR: NEGATIVE

## 2020-05-19 LAB — PROTIME-INR
INR: 1.7 — ABNORMAL HIGH (ref 0.8–1.2)
Prothrombin Time: 19.6 seconds — ABNORMAL HIGH (ref 11.4–15.2)

## 2020-05-19 LAB — APTT: aPTT: 60 seconds — ABNORMAL HIGH (ref 24–36)

## 2020-05-19 LAB — ABO/RH: ABO/RH(D): A POS

## 2020-05-19 LAB — LACTIC ACID, PLASMA: Lactic Acid, Venous: 1 mmol/L (ref 0.5–1.9)

## 2020-05-19 LAB — MRSA PCR SCREENING: MRSA by PCR: NEGATIVE

## 2020-05-19 MED ORDER — GUAIFENESIN ER 600 MG PO TB12
600.0000 mg | ORAL_TABLET | Freq: Two times a day (BID) | ORAL | Status: DC
  Administered 2020-05-19 – 2020-05-29 (×22): 600 mg via ORAL
  Filled 2020-05-19 (×22): qty 1

## 2020-05-19 MED ORDER — DICLOFENAC SODIUM 1 % TD GEL
4.0000 g | Freq: Four times a day (QID) | TRANSDERMAL | Status: DC
  Administered 2020-05-20 – 2020-05-31 (×42): 4 g via TOPICAL
  Filled 2020-05-19 (×2): qty 100

## 2020-05-19 MED ORDER — OXYCODONE HCL 5 MG PO TABS: 5.0000 mg | ORAL_TABLET | Freq: Four times a day (QID) | ORAL | Status: DC | PRN

## 2020-05-19 MED ORDER — METOCLOPRAMIDE HCL 10 MG PO TABS
5.0000 mg | ORAL_TABLET | Freq: Four times a day (QID) | ORAL | Status: DC | PRN
  Administered 2020-05-21 – 2020-05-23 (×3): 5 mg via ORAL
  Filled 2020-05-19 (×3): qty 1

## 2020-05-19 MED ORDER — MAGNESIUM HYDROXIDE 400 MG/5ML PO SUSP: 30.0000 mL | Freq: Every day | ORAL | Status: DC | PRN

## 2020-05-19 MED ORDER — BACLOFEN 10 MG PO TABS
20.0000 mg | ORAL_TABLET | Freq: Three times a day (TID) | ORAL | Status: DC
  Administered 2020-05-19 – 2020-06-03 (×41): 20 mg via ORAL
  Filled 2020-05-19 (×49): qty 2

## 2020-05-19 MED ORDER — LACTATED RINGERS IV BOLUS
2000.0000 mL | Freq: Once | INTRAVENOUS | Status: AC
  Administered 2020-05-19: 2000 mL via INTRAVENOUS

## 2020-05-19 MED ORDER — TIZANIDINE HCL 4 MG PO TABS
4.0000 mg | ORAL_TABLET | Freq: Three times a day (TID) | ORAL | Status: DC
  Administered 2020-05-19 – 2020-05-29 (×30): 4 mg via ORAL
  Filled 2020-05-19 (×34): qty 1

## 2020-05-19 MED ORDER — LORATADINE 10 MG PO TABS
10.0000 mg | ORAL_TABLET | Freq: Every day | ORAL | Status: DC
  Administered 2020-05-19 – 2020-05-29 (×11): 10 mg via ORAL
  Filled 2020-05-19 (×11): qty 1

## 2020-05-19 MED ORDER — ROPINIROLE HCL 1 MG PO TABS
2.0000 mg | ORAL_TABLET | Freq: Four times a day (QID) | ORAL | Status: DC
  Administered 2020-05-19 – 2020-05-25 (×25): 2 mg via ORAL
  Filled 2020-05-19 (×28): qty 2

## 2020-05-19 MED ORDER — ONDANSETRON HCL 4 MG/2ML IJ SOLN
4.0000 mg | Freq: Four times a day (QID) | INTRAMUSCULAR | Status: DC | PRN
  Administered 2020-05-20 – 2020-05-23 (×2): 4 mg via INTRAVENOUS
  Filled 2020-05-19 (×2): qty 2

## 2020-05-19 MED ORDER — ONDANSETRON HCL 4 MG PO TABS: 4.0000 mg | ORAL_TABLET | Freq: Four times a day (QID) | ORAL | Status: DC | PRN

## 2020-05-19 MED ORDER — FLUTICASONE PROPIONATE 50 MCG/ACT NA SUSP
1.0000 | Freq: Every day | NASAL | Status: DC
  Administered 2020-05-19 – 2020-05-25 (×7): 1 via NASAL
  Filled 2020-05-19 (×2): qty 16

## 2020-05-19 MED ORDER — OXYCODONE HCL 5 MG PO TABS
5.0000 mg | ORAL_TABLET | Freq: Four times a day (QID) | ORAL | Status: DC | PRN
  Administered 2020-05-20 – 2020-05-21 (×2): 5 mg via ORAL
  Filled 2020-05-19: qty 1

## 2020-05-19 MED ORDER — ESCITALOPRAM OXALATE 10 MG PO TABS
20.0000 mg | ORAL_TABLET | Freq: Every day | ORAL | Status: DC
  Administered 2020-05-19 – 2020-05-29 (×11): 20 mg via ORAL
  Filled 2020-05-19 (×11): qty 2

## 2020-05-19 MED ORDER — ACETAMINOPHEN 325 MG PO TABS
650.0000 mg | ORAL_TABLET | Freq: Four times a day (QID) | ORAL | Status: DC | PRN
  Administered 2020-05-21: 650 mg via ORAL
  Filled 2020-05-19: qty 2

## 2020-05-19 MED ORDER — GABAPENTIN 600 MG PO TABS
600.0000 mg | ORAL_TABLET | Freq: Four times a day (QID) | ORAL | Status: DC
  Administered 2020-05-19 – 2020-06-03 (×52): 600 mg via ORAL
  Filled 2020-05-19 (×57): qty 1

## 2020-05-19 MED ORDER — IPRATROPIUM-ALBUTEROL 0.5-2.5 (3) MG/3ML IN SOLN
3.0000 mL | Freq: Four times a day (QID) | RESPIRATORY_TRACT | Status: DC
  Administered 2020-05-19 – 2020-05-22 (×14): 3 mL via RESPIRATORY_TRACT
  Filled 2020-05-19 (×14): qty 3

## 2020-05-19 MED ORDER — ENOXAPARIN SODIUM 40 MG/0.4ML ~~LOC~~ SOLN: 40.0000 mg | SUBCUTANEOUS | Status: DC

## 2020-05-19 MED ORDER — FAMOTIDINE 20 MG PO TABS
20.0000 mg | ORAL_TABLET | Freq: Two times a day (BID) | ORAL | Status: DC
  Administered 2020-05-19 – 2020-05-29 (×21): 20 mg via ORAL
  Filled 2020-05-19 (×21): qty 1

## 2020-05-19 MED ORDER — SODIUM CHLORIDE 0.9 % IV SOLN
1.0000 g | Freq: Once | INTRAVENOUS | Status: AC
  Administered 2020-05-19: 1 g via INTRAVENOUS
  Filled 2020-05-19: qty 1

## 2020-05-19 MED ORDER — MAGNESIUM OXIDE 400 (241.3 MG) MG PO TABS
400.0000 mg | ORAL_TABLET | Freq: Two times a day (BID) | ORAL | Status: DC
  Administered 2020-05-19 – 2020-05-29 (×19): 400 mg via ORAL
  Filled 2020-05-19 (×15): qty 1

## 2020-05-19 MED ORDER — ADULT MULTIVITAMIN W/MINERALS CH
1.0000 | ORAL_TABLET | Freq: Every day | ORAL | Status: DC
  Administered 2020-05-19 – 2020-05-29 (×11): 1 via ORAL
  Filled 2020-05-19 (×11): qty 1

## 2020-05-19 MED ORDER — HYDROXYUREA 500 MG PO CAPS
500.0000 mg | ORAL_CAPSULE | ORAL | Status: DC
  Administered 2020-05-20 – 2020-05-29 (×8): 500 mg via ORAL
  Filled 2020-05-19 (×9): qty 1

## 2020-05-19 MED ORDER — RIVAROXABAN 20 MG PO TABS
20.0000 mg | ORAL_TABLET | Freq: Every day | ORAL | Status: DC
  Administered 2020-05-19: 20 mg via ORAL
  Filled 2020-05-19 (×3): qty 1

## 2020-05-19 MED ORDER — TRAZODONE HCL 50 MG PO TABS
75.0000 mg | ORAL_TABLET | Freq: Every day | ORAL | Status: DC
  Administered 2020-05-19 – 2020-05-31 (×13): 75 mg via ORAL
  Filled 2020-05-19 (×13): qty 2

## 2020-05-19 MED ORDER — SODIUM CHLORIDE 0.9 % IV SOLN
500.0000 mg | INTRAVENOUS | Status: DC
  Administered 2020-05-19 – 2020-05-20 (×2): 500 mg via INTRAVENOUS
  Filled 2020-05-19 (×2): qty 500

## 2020-05-19 MED ORDER — LACTATED RINGERS IV BOLUS
1000.0000 mL | Freq: Once | INTRAVENOUS | Status: AC
  Administered 2020-05-19: 1000 mL via INTRAVENOUS

## 2020-05-19 MED ORDER — SODIUM CHLORIDE 0.9 % IV SOLN
2.0000 g | INTRAVENOUS | Status: DC
  Administered 2020-05-19 – 2020-05-21 (×3): 2 g via INTRAVENOUS
  Filled 2020-05-19: qty 20
  Filled 2020-05-19 (×2): qty 2

## 2020-05-19 MED ORDER — NOREPINEPHRINE 4 MG/250ML-% IV SOLN: 0.0000 ug/min | INTRAVENOUS | Status: DC

## 2020-05-19 MED ORDER — SODIUM CHLORIDE 0.9 % IV SOLN: INTRAVENOUS | Status: DC

## 2020-05-19 MED ORDER — OXYCODONE HCL 5 MG PO TABS
5.0000 mg | ORAL_TABLET | Freq: Four times a day (QID) | ORAL | Status: DC
  Administered 2020-05-19 – 2020-06-03 (×42): 5 mg via ORAL
  Filled 2020-05-19 (×47): qty 1

## 2020-05-19 MED ORDER — ALBUMIN HUMAN 5 % IV SOLN
25.0000 g | Freq: Once | INTRAVENOUS | Status: AC
  Administered 2020-05-19: 25 g via INTRAVENOUS
  Filled 2020-05-19: qty 500

## 2020-05-19 MED ORDER — ACETAMINOPHEN 650 MG RE SUPP: 650.0000 mg | Freq: Four times a day (QID) | RECTAL | Status: DC | PRN

## 2020-05-19 MED ORDER — HYDROCOD POLST-CPM POLST ER 10-8 MG/5ML PO SUER: 5.0000 mL | Freq: Two times a day (BID) | ORAL | Status: DC | PRN

## 2020-05-19 MED ORDER — VANCOMYCIN HCL IN DEXTROSE 1-5 GM/200ML-% IV SOLN
1000.0000 mg | Freq: Once | INTRAVENOUS | Status: AC
  Administered 2020-05-19: 1000 mg via INTRAVENOUS
  Filled 2020-05-19: qty 200

## 2020-05-19 MED ORDER — ACETAMINOPHEN 650 MG RE SUPP: 650.0000 mg | Freq: Once | RECTAL | Status: DC

## 2020-05-19 NOTE — ED Notes (Signed)
Pt incont small amount brown stool; pt clensed, I&O cath performed and purwick applied

## 2020-05-19 NOTE — H&P (Signed)
[facility] NAME: Cynthia Price    MR#:  [id number]  DATE OF BIRTH:  [birth year]  DATE OF ADMISSION:  05/18/2020  PRIMARY CARE PHYSICIAN: [name], MD   REQUESTING/REFERRING PHYSICIAN: Gonzella Lex, MD  CHIEF COMPLAINT:   Chief Complaint  Patient presents with  . Hypotension    HISTORY OF PRESENT ILLNESS:  Cynthia Price  is a 74 y.o. female with a known history of multiple sclerosis, DVT on Xarelto, depression, coronary artery disease, IBS, chronic anemia and chronic diastolic CHF who comes from her skilled nursing facility with acute onset of fever for the last couple of days for which she was given Tylenol and was noted to have hypotension that prompted 911 call.  Upon arrival EMS her blood pressure was 60/44 for which she was given IV fluid bolus and blood pressure was up to the 80s in the ER.  No history could be obtained from the patient due to her altered mental status and likely underlying dementia.  Upon presentation to the emergency room, blood pressure was 81/55 with a MAP of 64 with a temperature of 100.2 rectally and later blood pressure was 76/46 with a MAP of 56 with pulse oximetry 99% on 2 L of O2 by nasal cannula.  Labs revealed, CMP was remarkable for 2X.3 and albumin 2.7.  Lactic acid was 1 and procalcitonin was 0.15.  CBC showed anemia with hemoglobin 8.3 and hematocrit 36.4 compared to 9.8 and 32.5 on 02/02/2019.  UA was positive for UTI.  COVID-19 PCR came back negative.  Chest x-ray showed streaky retrocardiac opacity and patchy right lung base opacity that may be atelectasis or pneumonia with possible small pleural effusions.  The patient was given  IV cefepime and vancomycin and 2 L bolus of IV lactated Ringer. Blood pressure initially improved to 110/62 and later dropped again to 75/49 with a MAP of 58.  After consultation with ICU physician by the ER physician, the ICU physician recommended 500 mL of 5% IV albumin  that was given and blood pressure improved temporarily to 97/64 with additional 1 L bolus of IV lactated Ringer as well as IV albumin with a MAP of 76 then 110/67 with a MAP of 80 and then within an hour dropped again to 85/54 with a MAP of 65 however with the patient was apparently asleep and during my interview her blood pressure again came up to 125/80 with a MAP of 94.   The patient will be admitted to a progressive unit bed for further evaluation and management.   PAST MEDICAL HISTORY:   Past Medical History:  Diagnosis Date  . Anemia 2012   from labs at [facility]  . Back pain    s/p hemilaminectomy with h/o herniated disc at L4L5  . Blood clot in vein    behind left knee  . BP (high blood pressure) 03/08/2014   Last Assessment & Plan:  Blood pressure has been controlled without significant dizzyness or associated fatigue. Taking antihypertensives as directed without difficulty.    Marland Kitchen CAD (coronary artery disease) 10/18/2011  . Chronic diastolic heart failure ([place]) 09/07/2014   Last Assessment & Plan:  Edema and sob seemingly baseline   . Collagen vascular disease ([place])   . Depression    after husband died  . DVT (deep venous thrombosis) ([place]) 05/14/2012  . Essential thrombocytosis ([place])   . Hyperlipidemia   . 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[place]) 03/05/2015  .  Hypotension 03/26/2013  . IBS (irritable bowel syndrome)   . Insomnia   . Lupus anticoagulant positive   . Macular degeneration   . MS (multiple sclerosis) ([place])   . Multiple sclerosis ([place]) 04/29/2008   Per neurology at [facility]    . NEUROPATHY 04/29/2008   Per Neurology at [facility] [address]    . RLS (restless legs syndrome)   . Spinal stenosis    lumbar  . Urinary retention    with high post void residuals 2013, per [facility]    PAST SURGICAL HISTORY:   Past Surgical History:  Procedure Laterality Date  . ANTERIOR LUMBAR FUSION  2012  . APPENDECTOMY    . CHOLECYSTECTOMY    . CHOLECYSTECTOMY    . LUMBAR DISC SURGERY    . LUMBAR  LAMINECTOMY    . TONSILLECTOMY      SOCIAL HISTORY:   Social History   Tobacco Use  . Smoking status: Never Smoker  . Smokeless tobacco: Never Used  Substance Use Topics  . Alcohol use: No    FAMILY HISTORY:   Family History  Problem Relation Age of Onset  . Heart disease Mother   . Thyroid cancer Mother   . Ovarian cancer Mother   . Alcohol abuse Father   . Heart disease Father   . Diabetes Sister   . Diabetes Brother   . Multiple sclerosis Paternal Aunt   . Multiple sclerosis Cousin     DRUG ALLERGIES:   Allergies  Allergen Reactions  . Atorvastatin     Muscle aches at >40mg      REVIEW OF SYSTEMS:   ROS As per history of present illness. All pertinent systems were reviewed above. Constitutional,  HEENT, cardiovascular, respiratory, GI, GU, musculoskeletal, neuro, psychiatric, endocrine,  integumentary and hematologic systems were reviewed and are otherwise  negative/unremarkable except for positive findings mentioned above in the HPI.   MEDICATIONS AT HOME:   Prior to Admission medications   Medication Sig Start Date End Date Taking? Authorizing Provider  acetaminophen (TYLENOL) 325 MG tablet Take 650 mg by mouth every 4 (four) hours as needed. Give 2 tablets by mouth every 4 hours prn for general discomfort. Do not exceed 3 grams of APAP/day from all sources   Yes [provider]  baclofen (LIORESAL) 20 MG tablet Take 20 mg by mouth 3 (three) times daily. 05/06/20  Yes [provider]  diclofenac sodium (VOLTAREN) 1 % GEL Apply 4 g topically in the morning and at bedtime. Apply to left shoulder and left knee   Yes [provider]  escitalopram (LEXAPRO) 20 MG tablet Take 20 mg by mouth daily.    Yes [provider]  fluticasone (FLONASE) 50 MCG/ACT nasal spray Place 1 spray into both nostrils daily.   Yes [provider]  gabapentin (NEURONTIN) 600 MG tablet Take 600 mg by mouth in the morning, at noon, in the  evening, and at bedtime.   Yes [provider]  hydroxyurea (HYDREA) 500 MG capsule Take one by mouth Monday, Tuesday, Wed, Thursday, Friday Patient taking differently: Take 500 mg by mouth every Monday, Tuesday, Wednesday, Thursday, and Friday.  05/10/13  Yes Tonia Ghent, MD  loratadine (CLARITIN) 10 MG tablet Take 10 mg by mouth daily.   Yes [provider]  Magnesium 500 MG CAPS Take 1 capsule (500 mg total) by mouth 2 (two) times daily at 8 am and 10 pm. 12/17/18 05/19/20 Yes Milinda Pointer, MD  metoCLOPramide (REGLAN) 5 MG tablet Take  5 mg by mouth. 1 tablet by mouth before meals and at bedtime.   Yes [provider]  Multiple Vitamin (MULTIVITAMIN) tablet Take 1 tablet by mouth daily.   Yes [provider]  oxyCODONE (OXY IR/ROXICODONE) 5 MG immediate release tablet Take 1 tablet (5 mg total) by mouth every 6 (six) hours as needed for up to 30 days for severe pain. Must Last 30 days. 02/15/19 05/19/20 Yes Milinda Pointer, MD  rOPINIRole (REQUIP) 2 MG tablet Take 2 mg by mouth 4 (four) times daily.   Yes [provider]  tamsulosin (FLOMAX) 0.4 MG CAPS capsule Take 0.4 mg by mouth daily.   Yes [provider]  tiZANidine (ZANAFLEX) 4 MG tablet Take 1 tablet (4 mg total) by mouth every 8 (eight) hours as needed for muscle spasms. Patient taking differently: Take 4 mg by mouth in the morning, at noon, and at bedtime.  12/17/18 05/19/20 Yes Milinda Pointer, MD  traZODone (DESYREL) 50 MG tablet Take 75 mg by mouth at bedtime.  12/24/18  Yes [provider]  XARELTO 20 MG TABS tablet Take 20 mg by mouth daily with breakfast.  09/07/15  Yes [provider]  AMPYRA 10 MG TB12 Take 10 mg by mouth 2 (two) times daily.  Patient not taking: Reported on 05/19/2020 09/11/12   [provider]  oxyCODONE (OXY IR/ROXICODONE) 5 MG immediate release tablet Take 1 tablet (5 mg total) by mouth every 6 (six) hours as needed for up  to 30 days for severe pain. Must last 30 days. Patient taking differently: Take 5 mg by mouth every 6 (six) hours. Must last 30 days. 01/16/19 02/15/19  Milinda Pointer, MD  oxyCODONE (OXY IR/ROXICODONE) 5 MG immediate release tablet Take 1 tablet (5 mg total) by mouth every 6 (six) hours as needed for up to 30 days for severe pain. Must last 30 days. 12/17/18 01/16/19  Milinda Pointer, MD      VITAL SIGNS:  Blood pressure 110/67, pulse 76, temperature (!) 96.9 F (36.1 C), temperature source Rectal, resp. rate 20, SpO2 96 %.  PHYSICAL EXAMINATION:  Physical Exam  GENERAL:  74 y.o.-year-old Caucasian female patient lying in the bed with no acute distress.  She was repeating the same words and not responding to commands. EYES: Pupils equal, round, reactive to light and accommodation. No scleral icterus. Extraocular muscles intact.  HEENT: Head atraumatic, normocephalic. Oropharynx and nasopharynx clear.  NECK:  Supple, no jugular venous distention. No thyroid enlargement, no tenderness.  LUNGS: Diminished bibasal breath sounds with bibasal crackles.   CARDIOVASCULAR: Regular rate and rhythm, S1, S2 normal. No murmurs, rubs, or gallops.  ABDOMEN: Soft, nondistended, nontender. Bowel sounds present. No organomegaly or mass.  EXTREMITIES: No pedal edema, cyanosis, or clubbing.  NEUROLOGIC: Cranial nerves II through XII are intact. Muscle strength 5/5 in all extremities. Sensation intact. Gait not checked.  PSYCHIATRIC: The patient is alert and globally confused. SKIN: No obvious rash, lesion, or ulcer.   LABORATORY PANEL:   CBC Recent Labs  Lab 05/19/20 0009  WBC 10.4  HGB 8.3*  HCT 26.4*  PLT 302   ------------------------------------------------------------------------------------------------------------------  Chemistries  Recent Labs  Lab 05/19/20 0009  NA 137  K 4.2  CL 98  CO2 31  GLUCOSE 138*  BUN 23  CREATININE 0.81  CALCIUM 8.1*  AST 18  ALT 10  ALKPHOS 72    BILITOT 0.6   ------------------------------------------------------------------------------------------------------------------  Cardiac Enzymes No results for input(s): TROPONINI in the last 168 hours. ------------------------------------------------------------------------------------------------------------------  RADIOLOGY:  DG Chest Portable 1 View  Result Date: 05/19/2020 CLINICAL DATA:  Sepsis.  Hypotension.  Fever. EXAM: PORTABLE CHEST 1 VIEW COMPARISON:  08/19/2017 FINDINGS: Patient's chin obscures the apices. Significant patient rotation. Stable heart size and mediastinal contours. Streaky retrocardiac opacity. Patchy right lung base opacity. Possible small pleural effusions. No pneumothorax. No pulmonary edema. Bones under mineralized with scoliotic curvature of the spine. IMPRESSION: 1. Streaky retrocardiac opacity and patchy right lung base opacity, may be atelectasis or pneumonia. Possible small pleural effusions. 2. Rotated exam. Electronically Signed   By: Keith Rake M.D.   On: 05/19/2020 01:01      IMPRESSION AND PLAN:   1.  Sepsis secondary to UTI and pneumonia possibly gram-negative.  I do not believe she has a Pseudomonas risk. -The patient will be admitted to a progressive unit bed. -We will continue IV antibiotic therapy with IV Rocephin and Zithromax. -Duo nebs will be provided 4 times daily and every 4 hours. -Mucolytic therapy will be provided. -We will obtain sputum culture and follow blood cultures. -We will follow urine culture as well. -We will continue hydration with IV normal saline. -Her blood pressure has been steady when she is awake.  2.  Anemia, slightly worse than previous levels. -We will monitor her H&H. -We will hold off on transfusion at this time especially that the patient has already received 5% albumin. -We will continue her vitamin B12 and multivitamins.  3.  Multiple sclerosis. -We will continue her Ampyra and baclofen.  4.   Depression. -We will continue her Lexapro.  5.  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 -We will continue Xarelto.   All the records are reviewed and case discussed with ED provider. The plan of care was discussed in details with the patient (and family). I answered all questions. The patient agreed to proceed with the above mentioned plan. Further management will depend upon hospital course.   CODE STATUS: Full code  Status is: Inpatient  Remains inpatient appropriate because:Hemodynamically unstable, Altered mental status, Ongoing diagnostic testing needed not appropriate for outpatient work up, Unsafe d/c plan, IV treatments appropriate due to intensity of illness or inability to take PO and Inpatient level of care appropriate due to severity of illness   Dispo: The patient is from: SNF              Anticipated d/c is to: SNF              Anticipated d/c date is: 3 days              Patient currently is not medically stable to d/c.   TOTAL TIME TAKING CARE OF THIS PATIENT: 55 minutes.    Christel Mormon M.D on 05/19/2020 at 4:56 AM  [facility]   From 7 PM-7 AM, contact night-coverage [email or web address]  CC: Primary care physician; [name], MD   Note: This dictation was prepared with Dragon dictation along with smaller phrase technology. Any transcriptional typo errors that result from this process are unintentional.

## 2020-05-19 NOTE — ED Provider Notes (Addendum)
[facility] Emergency Department Provider Note  ____________________________________________  Time seen: Approximately 12:54 AM  I have reviewed the triage vital signs and the nursing notes.   HISTORY  Chief Complaint Hypotension  Level 5 caveat:  Portions of the history and physical were unable to be obtained due to senile dementia   HPI Cynthia Price is a 74 y.o. female with a history of anemia, hypertension, CAD, heart failure, spastic diplegia, IBS, thrombocytosis, DVT on Xarelto who presents for evaluation of fever and hypotension.  Patient unable to provide any history.  Per EMS patient has had a fever for the last few days.  Received rectal Tylenol just prior to their arrival.  This evening her blood pressure was low which prompted them to call 911.  No vomiting or diarrhea, no cough.  Past Medical History:  Diagnosis Date  . Anemia 2012   from labs at [facility]  . Back pain    s/p hemilaminectomy with h/o herniated disc at L4L5  . Blood clot in vein    behind left knee  . BP (high blood pressure) 03/08/2014   Last Assessment & Plan:  Blood pressure has been controlled without significant dizzyness or associated fatigue. Taking antihypertensives as directed without difficulty.    Marland Kitchen CAD (coronary artery disease) 10/18/2011  . Chronic diastolic heart failure ([place]) 09/07/2014   Last Assessment & Plan:  Edema and sob seemingly baseline   . Collagen vascular disease ([place])   . Depression    after husband died  . DVT (deep venous thrombosis) ([place]) 05/14/2012  . Essential thrombocytosis ([place])   . Hyperlipidemia   . 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[place]) 03/05/2015  . Hypotension 03/26/2013  . IBS (irritable bowel syndrome)   . Insomnia   . Lupus anticoagulant positive   . Macular degeneration   . MS (multiple sclerosis) ([place])   . Multiple sclerosis ([place]) 04/29/2008   Per neurology at [facility]    . NEUROPATHY 04/29/2008   Per Neurology at [facility]    . RLS  (restless legs syndrome)   . Spinal stenosis    lumbar  . Urinary retention    with high post void residuals 2013, per [facility] uro    Patient Active Problem List   Diagnosis Date Noted  . Left shoulder pain 04/16/2020  . Insomnia 05/02/2019  . Neurogenic pain 12/17/2018  . Palliative care encounter 12/13/2018  . Shortness of breath 12/13/2018  . Weakness generalized 12/13/2018  . Spastic diplegia ([place]) 11/13/2018  . Abnormal MRI, cervical spine (07/27/2018) 10/09/2018  . Cervical central spinal stenosis 10/09/2018  . Cervical foraminal stenosis 10/09/2018  . Cervical facet arthropathy 10/09/2018  . Cervicalgia 10/09/2018  . Cervical facet syndrome (Bilateral) (R>L) 10/09/2018  . Chronic upper extremity pain (Secondary Area of Pain) (Bilateral) (R>L) 09/17/2018  . Long term current use of anticoagulant therapy (Xarelto) 09/17/2018  . DDD (degenerative disc disease), cervical 09/17/2018  . Chronic neck pain (Primary Area of Pain) (Bilateral) (R>L) 08/27/2018  . Chronic low back pain [facility] Area of Pain) (Bilateral) (L>R) w/ sciatica (Bilateral) 08/27/2018  . Chronic knee pain (Fourth Area of Pain) (Bilateral) (L>R) 08/27/2018  . Chronic lower extremity pain (Bilateral) (L>R) 08/27/2018  . Chronic pain syndrome 08/27/2018  . Pharmacologic therapy 08/27/2018  . Disorder of skeletal system 08/27/2018  . Problems influencing health status 08/27/2018  . Long term current use of opiate analgesic 08/27/2018  . Degenerative cervical spinal stenosis 08/06/2018  . Resides in skilled nursing facility 08/06/2018  .  Rash 06/19/2018  . Muscle spasticity 05/17/2018  . Inability to walk 05/17/2018  . Mild protein-calorie malnutrition ([place]) 01/03/2018  . Ulcer, skin, non-healing, limited to breakdown of skin ([place]) 12/19/2017  . Intractable pain 08/19/2017  . Chronic deep vein thrombosis (DVT) of proximal vein of left lower extremity ([place]) 05/16/2017  . Seizure-like activity ([place]) 04/20/2017    . Bladder mass 01/20/2017  . Encounter for general adult medical examination without abnormal findings 11/24/2016  . Spondylosis of lumbar region without myelopathy or radiculopathy 05/23/2016  . 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 Urinary retention with incomplete bladder emptying 12/22/2015  . ET (essential thrombocythemia) ([place]) 12/22/2015  . DS (disseminated sclerosis) ([place]) 09/15/2015  . Rotator cuff tendinitis (Right) 06/19/2015  . Back sprain 06/05/2015  . Chest wall contusion 06/05/2015  . Effusion of knee 05/22/2015  . Arthritis of knee, degenerative 05/22/2015  . Effusion of left knee 05/22/2015  . Osteoarthritis of knee (Left) 05/22/2015  . Contusion of shoulder 04/14/2015  . 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[place]) 03/05/2015  . Mild pulmonary hypertension ([place]) 03/05/2015  . Complete rotator cuff rupture of shoulder (Left) 02/05/2015  . Infraspinatus tenosynovitis 02/05/2015  . Complete tear of left rotator cuff 02/05/2015  . Chronic diastolic heart failure ([place]) 09/07/2014  . Deep vein thrombosis ([place]) 07/10/2014  . History of deep vein thrombosis (DVT) of lower extremity 07/10/2014  . HLD (hyperlipidemia) 03/08/2014  . BP (high blood pressure) 03/08/2014  . Hyperlipidemia 03/08/2014  . Hypertension 03/08/2014  . UTI (urinary tract infection) 02/05/2014  . Cellulitis, toe 08/18/2013  . Bradycardia 03/26/2013  . Hypotension 03/26/2013  . RLS (restless legs syndrome) 08/31/2012  . Constipation 07/20/2012  . Prolapse of urethra 07/20/2012  . Frequent UTI 07/20/2012  . Neurogenic dysfunction of the urinary bladder 07/20/2012  . Vaginal atrophy 07/20/2012  . Recurrent UTI 07/20/2012  . Neuromuscular dysfunction of bladder 07/20/2012  . Prolapse urethral mucosa 07/20/2012  . DVT (deep venous thrombosis) ([place]) 05/14/2012  . Risk for falls 03/16/2012  . Lumbar canal stenosis 02/16/2012  . Lumbosacral radiculitis 02/16/2012  . Restless leg 01/20/2012  . 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 Restless legs syndrome (RLS) 01/20/2012  . Urinary retention 12/04/2011  . CAD (coronary artery disease) 10/18/2011  . Anemia 10/17/2011  . Diastolic dysfunction 02/77/4128  . Tachycardia 10/01/2011  . Thrombocythemia, essential ([place]) 05/26/2011  . Edema 05/26/2011  . HYPERCHOLESTEROLEMIA 04/29/2010  . Depression 04/29/2010  . CYSTOCELE WITHOUT MENTION UTERINE PROLAPSE LAT 04/29/2010  . UNS ADVRS EFF OTH RX MEDICINAL&BIOLOGICAL SBSTNC 04/29/2010  . Depressive disorder, not elsewhere classified 04/29/2010  . Vitamin D deficiency 04/29/2010  . Multiple sclerosis ([place]) 04/29/2008  . NEUROPATHY 04/29/2008  . FATIGUE 04/29/2008    Past Surgical History:  Procedure Laterality Date  . ANTERIOR LUMBAR FUSION  2012  . APPENDECTOMY    . CHOLECYSTECTOMY    . CHOLECYSTECTOMY    . LUMBAR DISC SURGERY    . LUMBAR LAMINECTOMY    . TONSILLECTOMY      Prior to Admission medications   Medication Sig Start Date End Date Taking? Authorizing Provider  acetaminophen (TYLENOL) 325 MG tablet Take 650 mg by mouth every 4 (four) hours as needed. Give 2 tablets by mouth every 4 hours prn for general discomfort. Do not exceed 3 grams of APAP/day from all sources   Yes [provider]  baclofen (LIORESAL) 20 MG tablet Take 20 mg by mouth 3 (three) times daily. 05/06/20  Yes [provider]  diclofenac  sodium (VOLTAREN) 1 % GEL Apply 4 g topically in the morning and at bedtime. Apply to left shoulder and left knee   Yes [provider]  escitalopram (LEXAPRO) 20 MG tablet Take 20 mg by mouth daily.    Yes [provider]  fluticasone (FLONASE) 50 MCG/ACT nasal spray Place 1 spray into both nostrils daily.   Yes [provider]  gabapentin (NEURONTIN) 600 MG tablet Take 600 mg by mouth in the morning, at noon, in the evening, and at bedtime.   Yes [provider]  hydroxyurea (HYDREA) 500 MG capsule Take  one by mouth Monday, Tuesday, Wed, Thursday, Friday Patient taking differently: Take 500 mg by mouth every Monday, Tuesday, Wednesday, Thursday, and Friday.  05/10/13  Yes Tonia Ghent, MD  loratadine (CLARITIN) 10 MG tablet Take 10 mg by mouth daily.   Yes [provider]  Magnesium 500 MG CAPS Take 1 capsule (500 mg total) by mouth 2 (two) times daily at 8 am and 10 pm. 12/17/18 05/19/20 Yes Milinda Pointer, MD  metoCLOPramide (REGLAN) 5 MG tablet Take 5 mg by mouth. 1 tablet by mouth before meals and at bedtime.   Yes [provider]  Multiple Vitamin (MULTIVITAMIN) tablet Take 1 tablet by mouth daily.   Yes [provider]  oxyCODONE (OXY IR/ROXICODONE) 5 MG immediate release tablet Take 1 tablet (5 mg total) by mouth every 6 (six) hours as needed for up to 30 days for severe pain. Must Last 30 days. 02/15/19 05/19/20 Yes Milinda Pointer, MD  rOPINIRole (REQUIP) 2 MG tablet Take 2 mg by mouth 4 (four) times daily.   Yes [provider]  tamsulosin (FLOMAX) 0.4 MG CAPS capsule Take 0.4 mg by mouth daily.   Yes [provider]  tiZANidine (ZANAFLEX) 4 MG tablet Take 1 tablet (4 mg total) by mouth every 8 (eight) hours as needed for muscle spasms. Patient taking differently: Take 4 mg by mouth in the morning, at noon, and at bedtime.  12/17/18 05/19/20 Yes Milinda Pointer, MD  traZODone (DESYREL) 50 MG tablet Take 75 mg by mouth at bedtime.  12/24/18  Yes [provider]  XARELTO 20 MG TABS tablet Take 20 mg by mouth daily with breakfast.  09/07/15  Yes [provider]  AMPYRA 10 MG TB12 Take 10 mg by mouth 2 (two) times daily.  Patient not taking: Reported on 05/19/2020 09/11/12   [provider]  oxyCODONE (OXY IR/ROXICODONE) 5 MG immediate release tablet Take 1 tablet (5 mg total) by mouth every 6 (six) hours as needed for up to 30 days for severe pain. Must last 30 days. Patient taking differently: Take 5 mg by mouth  every 6 (six) hours. Must last 30 days. 01/16/19 02/15/19  Milinda Pointer, MD  oxyCODONE (OXY IR/ROXICODONE) 5 MG immediate release tablet Take 1 tablet (5 mg total) by mouth every 6 (six) hours as needed for up to 30 days for severe pain. Must last 30 days. 12/17/18 01/16/19  Milinda Pointer, MD    Allergies Atorvastatin  Family History  Problem Relation Age of Onset  . Heart disease Mother   . Thyroid cancer Mother   . Ovarian cancer Mother   . Alcohol abuse Father   . Heart disease Father   . Diabetes Sister   . Diabetes Brother   . Multiple sclerosis Paternal Aunt   . Multiple sclerosis Cousin     Social History Social History   Tobacco Use  . Smoking status:  Never Smoker  . Smokeless tobacco: Never Used  Substance Use Topics  . Alcohol use: No  . Drug use: No    Review of Systems  Constitutional: + fever. Respiratory: Negative for cough or difficulty breathing Gastrointestinal: Negative for vomiting or diarrhea.  ____________________________________________   PHYSICAL EXAM:  VITAL SIGNS: ED Triage Vitals [05/19/20 0005]  Enc Vitals Group     BP (!) 81/55     Pulse Rate 60     Resp 20     Temp 100.2 F (37.9 C)     Temp Source Rectal     SpO2 99 %     Weight      Height      Head Circumference      Peak Flow      Pain Score      Pain Loc      Pain Edu?      Excl. in [place]?     Constitutional: Awake, does not answer questions, no distress HEENT:      Head: Normocephalic and atraumatic.         Eyes: Conjunctivae are normal. Sclera is non-icteric.       Mouth/Throat: Mucous membranes are dry.       Neck: Supple with no signs of meningismus. Cardiovascular: Regular rate and rhythm. No murmurs, gallops, or rubs. Respiratory: Normal respiratory effort. Lungs are clear to auscultation bilaterally. No wheezes, crackles, or rhonchi.  Gastrointestinal: Soft, non tender, and non distended with positive bowel sounds. No rebound or  guarding. Genitourinary: Stage I decubitus ulcer Musculoskeletal:  No edema, cyanosis, or erythema of extremities. Neurologic: Face is symmetric. L sided hemiparesis Skin: Skin is warm, dry and intact. No rash noted. Psychiatric: Mood and affect are normal. Speech and behavior are normal.  ____________________________________________   LABS (all labs ordered are listed, but only abnormal results are displayed)  Labs Reviewed  CBC WITH DIFFERENTIAL/PLATELET - Abnormal; Notable for the following components:      Result Value   RBC 2.59 (*)    Hemoglobin 8.3 (*)    HCT 26.4 (*)    MCV 101.9 (*)    Neutro Abs 8.6 (*)    All other components within normal limits  COMPREHENSIVE METABOLIC PANEL - Abnormal; Notable for the following components:   Glucose, Bld 138 (*)    Calcium 8.1 (*)    Total Protein 6.3 (*)    Albumin 2.7 (*)    All other components within normal limits  URINALYSIS, COMPLETE (UACMP) WITH MICROSCOPIC - Abnormal; Notable for the following components:   Color, Urine AMBER (*)    APPearance TURBID (*)    Ketones, ur 5 (*)    Protein, ur 100 (*)    Leukocytes,Ua TRACE (*)    WBC, UA >50 (*)    Bacteria, UA MANY (*)    All other components within normal limits  RESPIRATORY PANEL BY RT PCR (FLU A&B, COVID)  CULTURE, BLOOD (ROUTINE X 2)  CULTURE, BLOOD (ROUTINE X 2)  URINE CULTURE  LACTIC ACID, PLASMA  PROCALCITONIN  LACTIC ACID, PLASMA  TYPE AND SCREEN   ____________________________________________  EKG  ED ECG REPORT I, Rudene Re, the attending physician, personally viewed and interpreted this ECG.  Normal sinus rhythm, rate of 58, normal intervals, normal axis, no ST elevations or depressions. ____________________________________________  RADIOLOGY  I have personally reviewed the images performed during this visit and I agree with the Radiologist's read.   Interpretation by Radiologist:  DG Chest Portable 1  View  Result Date:  05/19/2020 CLINICAL DATA:  Sepsis.  Hypotension.  Fever. EXAM: PORTABLE CHEST 1 VIEW COMPARISON:  08/19/2017 FINDINGS: Patient's chin obscures the apices. Significant patient rotation. Stable heart size and mediastinal contours. Streaky retrocardiac opacity. Patchy right lung base opacity. Possible small pleural effusions. No pneumothorax. No pulmonary edema. Bones under mineralized with scoliotic curvature of the spine. IMPRESSION: 1. Streaky retrocardiac opacity and patchy right lung base opacity, may be atelectasis or pneumonia. Possible small pleural effusions. 2. Rotated exam. Electronically Signed   By: Keith Rake M.D.   On: 05/19/2020 01:01      ____________________________________________   PROCEDURES  Procedure(s) performed:yes .1-3 Lead EKG Interpretation Performed by: Rudene Re, MD Authorized by: Rudene Re, MD     Interpretation: normal     ECG rate assessment: normal     Rhythm: sinus rhythm     Ectopy: none     Critical Care performed: yes  CRITICAL CARE Performed by: Rudene Re  ?  Total critical care time: 45 min  Critical care time was exclusive of separately billable procedures and treating other patients.  Critical care was necessary to treat or prevent imminent or life-threatening deterioration.  Critical care was time spent personally by me on the following activities: development of treatment plan with patient and/or surrogate as well as nursing, discussions with consultants, evaluation of patient's response to treatment, examination of patient, obtaining history from patient or surrogate, ordering and performing treatments and interventions, ordering and review of laboratory studies, ordering and review of radiographic studies, pulse oximetry and re-evaluation of patient's condition.  ____________________________________________   INITIAL IMPRESSION / ASSESSMENT AND PLAN / ED COURSE  74 y.o. female with a history of anemia,  hypertension, CAD, heart failure, spastic diplegia, IBS, thrombocytosis, DVT on Xarelto who presents for evaluation of fever and hypotension.  Patient is unable to provide any history.  She arrives with a temp of 100.54F rectal after receiving rectal Tylenol at the nursing home.  Has had a fever for 2 days.  She is hypotensive with BP of 81/55 but not tachycardic.  Concerns for sepsis.  Will initiate sepsis protocol.  Will give IV fluids for hypotension.  Patient placed on telemetry for close monitoring.  Will get UA, blood cultures, urine culture, chest x-ray, lactic acid, procalcitonin, and basic labs.  We will covered with broad-spectrum antibiotics.  Old medical records reviewed.  _________________________ 2:01 AM on 05/19/2020 -----------------------------------------  BP remains soft after 2L LR, will start maintenance at 250cc/hr. spoke with patient's son [name] on the phone who is in agreement with initiation of pressors.  Will start patient on nor epi and get her admitted to the ICU.  She has received IV cefepime and vancomycin.  Covid is negative.  UA is positive for UTI.  Chest x-ray positive for pneumonia, confirmed by radiology.  Normal white count and lactic but elevated procalcitonin.  Patient also has acute on chronic anemia with hemoglobin of 8.3.  Rectal exam negative for blood.  Will discuss with the ICU.  _________________________ 2:14 AM on 05/19/2020 ----------------------------------------- BP now improving with no pressors. 110/58. Discussed with ICU provider who recommended giving 500cc of 5% albumin and reassessing BP. If BP remains stable with no need for pressors, patient can be admitted to Hospitalist.     _________________________ 4:23 AM on 05/19/2020 -----------------------------------------  BP has remained stable in the 100s after receiving albumin.  Discussed with the hospitalist will admit patient.  _____________________________________________ Please note:   Patient was evaluated  in Emergency Department today for the symptoms described in the history of present illness. Patient was evaluated in the context of the global COVID-19 pandemic, which necessitated consideration that the patient might be at risk for infection with the SARS-CoV-2 virus that causes COVID-19. Institutional protocols and algorithms that pertain to the evaluation of patients at risk for COVID-19 are in a state of rapid change based on information released by regulatory bodies including the CDC and federal and state organizations. These policies and algorithms were followed during the patient's care in the ED.  Some ED evaluations and interventions may be delayed as a result of limited staffing during the pandemic.   [place] Controlled Substance Database was reviewed by me. ____________________________________________   FINAL CLINICAL IMPRESSION(S) / ED DIAGNOSES   Final diagnoses:  Acute cystitis with hematuria  Healthcare-associated pneumonia  Acute on chronic anemia  Sepsis, due to unspecified organism, unspecified whether acute organ dysfunction present ([place])  Septic shock ([place])      NEW MEDICATIONS STARTED DURING THIS VISIT:  ED Discharge Orders    None       Note:  This document was prepared using Dragon voice recognition software and may include unintentional dictation errors.    Alfred Levins, [place], MD 05/19/20 Maryln Manuel, [place], MD 05/19/20 [phone]

## 2020-05-19 NOTE — Progress Notes (Addendum)
Brief hospitalist update note.  This is a nonbillable note.  Please see same day H&P by Dr. Sidney Ace for full billable details.  Briefly this is a 74 year old female with history significant for multiple sclerosis, DVT on chronic anticoagulation, chronic diastolic congestive heart failure who presents from the skilled nursing facility with acute onset of fever of times a few days and hypotension which prompted EMS.  Apparently EMS arrived and the patient's blood pressure was 60/44.  Patient's blood pressure did respond to aggressive intravenous fluid resuscitation.  On admission patient's blood pressure had responded and was deemed appropriate for progressive cardiac unit.  Was also started on broad-spectrum antibiotics for presumptive community-acquired pneumonia  On my evaluation the patient does not appear clinically septic or toxic.  Functional status appears poor.  Difficult to obtain a history given advanced dementia.  No changes made in management regimen at this time  Continue azithromycin and ceftriaxone Continue IV fluids for now Follow cultures for pathogen identification and de-escalation of antibiotics  Ralene Muskrat MD

## 2020-05-19 NOTE — ED Triage Notes (Addendum)
Pt to room 3, awake and alert to self; EMS brought pt in from [facility]; st was called out for "heart problems"; found pt hypotensive and unresponsive, pt has DNR; staff reported pt with fever several days; rectal tylenol admin PTA; pt on O2 at 2l/min via [place] at all times; pt assisted into gown & on card monitor; Dr Alfred Levins at bedside

## 2020-05-19 NOTE — ED Notes (Signed)
Pt ate whole cup of peaches from lunch tray at this time

## 2020-05-19 NOTE — ED Notes (Signed)
Pt incont large amount loose brown stool; pt clensed, attends changed and new purwick in place

## 2020-05-20 DIAGNOSIS — K922 Gastrointestinal hemorrhage, unspecified: Secondary | ICD-10-CM

## 2020-05-20 DIAGNOSIS — I959 Hypotension, unspecified: Secondary | ICD-10-CM

## 2020-05-20 DIAGNOSIS — I82409 Acute embolism and thrombosis of unspecified deep veins of unspecified lower extremity: Secondary | ICD-10-CM

## 2020-05-20 LAB — RETICULOCYTES
Immature Retic Fract: 17.8 % — ABNORMAL HIGH (ref 2.3–15.9)
RBC.: 2.26 MIL/uL — ABNORMAL LOW (ref 3.87–5.11)
Retic Count, Absolute: 36.4 10*3/uL (ref 19.0–186.0)
Retic Ct Pct: 1.6 % (ref 0.4–3.1)

## 2020-05-20 LAB — HEMOGLOBIN AND HEMATOCRIT, BLOOD
HCT: 20.6 % — ABNORMAL LOW (ref 36.0–46.0)
HCT: 23.3 % — ABNORMAL LOW (ref 36.0–46.0)
HCT: 29.2 % — ABNORMAL LOW (ref 36.0–46.0)
Hemoglobin: 6.6 g/dL — ABNORMAL LOW (ref 12.0–15.0)
Hemoglobin: 7.2 g/dL — ABNORMAL LOW (ref 12.0–15.0)
Hemoglobin: 9.3 g/dL — ABNORMAL LOW (ref 12.0–15.0)

## 2020-05-20 LAB — URINE CULTURE

## 2020-05-20 LAB — FERRITIN: Ferritin: 163 ng/mL (ref 11–307)

## 2020-05-20 LAB — IRON AND TIBC
Iron: 32 ug/dL (ref 28–170)
Saturation Ratios: 24 % (ref 10.4–31.8)
TIBC: 133 ug/dL — ABNORMAL LOW (ref 250–450)
UIBC: 101 ug/dL

## 2020-05-20 LAB — PREPARE RBC (CROSSMATCH)

## 2020-05-20 LAB — VITAMIN B12: Vitamin B-12: 1403 pg/mL — ABNORMAL HIGH (ref 180–914)

## 2020-05-20 LAB — FOLATE: Folate: 16.1 ng/mL (ref >5.9)

## 2020-05-20 LAB — OCCULT BLOOD X 1 CARD TO LAB, STOOL: Fecal Occult Bld: POSITIVE — AB

## 2020-05-20 MED ORDER — AZITHROMYCIN 250 MG PO TABS
500.0000 mg | ORAL_TABLET | Freq: Once | ORAL | Status: AC
  Administered 2020-05-21: 500 mg via ORAL
  Filled 2020-05-20: qty 2

## 2020-05-20 MED ORDER — SODIUM CHLORIDE 0.9% IV SOLUTION: Freq: Once | INTRAVENOUS | Status: AC

## 2020-05-20 MED ORDER — SODIUM CHLORIDE 0.9% FLUSH
3.0000 mL | Freq: Two times a day (BID) | INTRAVENOUS | Status: DC
  Administered 2020-05-20 – 2020-05-30 (×16): 3 mL via INTRAVENOUS

## 2020-05-20 NOTE — NC FL2 (Signed)
[place] LEVEL OF CARE SCREENING TOOL     IDENTIFICATION  Patient Name: Cynthia Price Birthdate: 09/11/1946 Sex: female Admission Date (Current Location): 05/18/2020  [facility] and [place] Number:  Engineering geologist and Address:  [facility], [address] [place], [place] [address]      Provider Number: [id number]  Attending Physician Name and Address:  Cristal Ford, DO  Relative Name and Phone Number:       Current Level of Care: Hospital Recommended Level of Care: [place] Prior Approval Number:    Date Approved/Denied:   PASRR Number:    Discharge Plan: SNF    Current Diagnoses: Patient Active Problem List   Diagnosis Date Noted  . Sepsis ([place]) 05/19/2020  . Left shoulder pain 04/16/2020  . Insomnia 05/02/2019  . Neurogenic pain 12/17/2018  . Palliative care encounter 12/13/2018  . Shortness of breath 12/13/2018  . Weakness generalized 12/13/2018  . Spastic diplegia ([place]) 11/13/2018  . Abnormal MRI, cervical spine (07/27/2018) 10/09/2018  . Cervical central spinal stenosis 10/09/2018  . Cervical foraminal stenosis 10/09/2018  . Cervical facet arthropathy 10/09/2018  . Cervicalgia 10/09/2018  . Cervical facet syndrome (Bilateral) (R>L) 10/09/2018  . Chronic upper extremity pain (Secondary Area of Pain) (Bilateral) (R>L) 09/17/2018  . Long term current use of anticoagulant therapy (Xarelto) 09/17/2018  . DDD (degenerative disc disease), cervical 09/17/2018  . Chronic neck pain (Primary Area of Pain) (Bilateral) (R>L) 08/27/2018  . Chronic low back pain [facility] Area of Pain) (Bilateral) (L>R) w/ sciatica (Bilateral) 08/27/2018  . Chronic knee pain (Fourth Area of Pain) (Bilateral) (L>R) 08/27/2018  . Chronic lower extremity pain (Bilateral) (L>R) 08/27/2018  . Chronic pain syndrome 08/27/2018  . Pharmacologic therapy 08/27/2018  . Disorder of skeletal system 08/27/2018  . Problems influencing  health status 08/27/2018  . Long term current use of opiate analgesic 08/27/2018  . Degenerative cervical spinal stenosis 08/06/2018  . Resides in skilled nursing facility 08/06/2018  . Rash 06/19/2018  . Muscle spasticity 05/17/2018  . Inability to walk 05/17/2018  . Mild protein-calorie malnutrition ([place]) 01/03/2018  . Ulcer, skin, non-healing, limited to breakdown of skin ([place]) 12/19/2017  . Intractable pain 08/19/2017  . Chronic deep vein thrombosis (DVT) of proximal vein of left lower extremity ([place]) 05/16/2017  . Seizure-like activity ([place]) 04/20/2017  . Bladder mass 01/20/2017  . Encounter for general adult medical examination without abnormal findings 11/24/2016  . Spondylosis of lumbar region without myelopathy or radiculopathy 05/23/2016  . 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 Urinary retention with incomplete bladder emptying 12/22/2015  . ET (essential thrombocythemia) ([place]) 12/22/2015  . DS (disseminated sclerosis) ([place]) 09/15/2015  . Rotator cuff tendinitis (Right) 06/19/2015  . Back sprain 06/05/2015  . Chest wall contusion 06/05/2015  . Effusion of knee 05/22/2015  . Arthritis of knee, degenerative 05/22/2015  . Effusion of left knee 05/22/2015  . Osteoarthritis of knee (Left) 05/22/2015  . Contusion of shoulder 04/14/2015  . 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[place]) 03/05/2015  . Mild pulmonary hypertension ([place]) 03/05/2015  . Complete rotator cuff rupture of shoulder (Left) 02/05/2015  . Infraspinatus tenosynovitis 02/05/2015  . Complete tear of left rotator cuff 02/05/2015  . Chronic diastolic heart failure ([place]) 09/07/2014  . Deep vein thrombosis ([place]) 07/10/2014  . History of deep vein thrombosis (DVT) of lower extremity 07/10/2014  . HLD (hyperlipidemia) 03/08/2014  . BP (high blood pressure) 03/08/2014  . Hyperlipidemia 03/08/2014  . Hypertension 03/08/2014  . UTI (urinary tract  infection) 02/05/2014  . Cellulitis, toe 08/18/2013  . Bradycardia  03/26/2013  . Hypotension 03/26/2013  . RLS (restless legs syndrome) 08/31/2012  . Constipation 07/20/2012  . Prolapse of urethra 07/20/2012  . Frequent UTI 07/20/2012  . Neurogenic dysfunction of the urinary bladder 07/20/2012  . Vaginal atrophy 07/20/2012  . Recurrent UTI 07/20/2012  . Neuromuscular dysfunction of bladder 07/20/2012  . Prolapse urethral mucosa 07/20/2012  . DVT (deep venous thrombosis) ([place]) 05/14/2012  . Risk for falls 03/16/2012  . Lumbar canal stenosis 02/16/2012  . Lumbosacral radiculitis 02/16/2012  . Restless leg 01/20/2012  . Multiple sclerosis, 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[place]) 01/20/2012  . Restless legs syndrome (RLS) 01/20/2012  . Urinary retention 12/04/2011  . CAD (coronary artery disease) 10/18/2011  . Anemia 10/17/2011  . Diastolic dysfunction 09/73/5329  . Tachycardia 10/01/2011  . Thrombocythemia, essential ([place]) 05/26/2011  . Edema 05/26/2011  . HYPERCHOLESTEROLEMIA 04/29/2010  . Depression 04/29/2010  . CYSTOCELE WITHOUT MENTION UTERINE PROLAPSE LAT 04/29/2010  . UNS ADVRS EFF OTH RX MEDICINAL&BIOLOGICAL SBSTNC 04/29/2010  . Depressive disorder, not elsewhere classified 04/29/2010  . Vitamin D deficiency 04/29/2010  . Multiple sclerosis ([place]) 04/29/2008  . NEUROPATHY 04/29/2008  . FATIGUE 04/29/2008    Orientation RESPIRATION BLADDER Height & Weight     Self, Place  O2 ([place] 4L) External catheter, Incontinent (placed 6/22) Weight: 107 lb 12.9 oz (48.9 kg) Height:  5\' 5"  (165.1 cm)  BEHAVIORAL SYMPTOMS/MOOD NEUROLOGICAL BOWEL NUTRITION STATUS      Incontinent Diet (heart healthy, thin liquids)  AMBULATORY STATUS COMMUNICATION OF NEEDS Skin   Extensive Assist Verbally Normal                       Personal Care Assistance Level of Assistance  Bathing, Feeding, Dressing Bathing Assistance: Maximum assistance Feeding assistance: Limited assistance Dressing Assistance: Maximum assistance     Functional Limitations Info  Sight, Hearing,  Speech Sight Info: Adequate Hearing Info: Adequate Speech Info: Adequate    SPECIAL CARE FACTORS FREQUENCY  PT (By licensed PT), OT (By licensed OT)     PT Frequency: 5x OT Frequency: 5x            Contractures Contractures Info: Not present    Additional Factors Info  Code Status, Allergies Code Status Info: DNR Allergies Info: Atorvastatin           Current Medications (05/20/2020):  This is the current hospital active medication list Current Facility-Administered Medications  Medication Dose Route Frequency Provider Last Rate Last Admin  . 0.9 %  sodium chloride infusion (Manually program via Guardrails IV Fluids)   Intravenous Once Cristal Ford, DO      . 0.9 %  sodium chloride infusion   Intravenous Continuous Mansy, Jan A, MD 100 mL/hr at 05/19/20 1828 New Bag at 05/19/20 1828  . acetaminophen (TYLENOL) tablet 650 mg  650 mg Oral Q6H PRN Mansy, Jan A, MD       Or  . acetaminophen (TYLENOL) suppository 650 mg  650 mg Rectal Q6H PRN Mansy, Arvella Merles, MD      . Derrill Memo ON 05/21/2020] azithromycin (ZITHROMAX) tablet 500 mg  500 mg Oral Once Oswald Hillock, RPH      . baclofen (LIORESAL) tablet 20 mg  20 mg Oral TID Mansy, Jan A, MD   20 mg at 05/20/20 9242  . cefTRIAXone (ROCEPHIN) 2 g in sodium chloride 0.9 % 100 mL IVPB  2 g Intravenous Q24H Oswald Hillock, RPH 200 mL/hr at  05/20/20 0603 2 g at 05/20/20 0603  . chlorpheniramine-HYDROcodone (TUSSIONEX) 10-8 MG/5ML suspension 5 mL  5 mL Oral Q12H PRN Mansy, Jan A, MD      . diclofenac sodium (VOLTAREN) 1 % transdermal gel 4 g  4 g Topical QID Mansy, Jan A, MD   4 g at 05/20/20 0826  . escitalopram (LEXAPRO) tablet 20 mg  20 mg Oral Daily Mansy, Jan A, MD   20 mg at 05/20/20 2641  . famotidine (PEPCID) tablet 20 mg  20 mg Oral BID Mansy, Jan A, MD   20 mg at 05/20/20 0820  . fluticasone (FLONASE) 50 MCG/ACT nasal spray 1 spray  1 spray Each Nare Daily Mansy, Jan A, MD   1 spray at 05/20/20 0826  . gabapentin (NEURONTIN)  tablet 600 mg  600 mg Oral QID Mansy, Jan A, MD   600 mg at 05/20/20 0819  . guaiFENesin (MUCINEX) 12 hr tablet 600 mg  600 mg Oral BID Mansy, Jan A, MD   600 mg at 05/20/20 0823  . hydroxyurea (HYDREA) capsule 500 mg  500 mg Oral Q MTWThF Mansy, Jan A, MD   500 mg at 05/20/20 0604  . ipratropium-albuterol (DUONEB) 0.5-2.5 (3) MG/3ML nebulizer solution 3 mL  3 mL Nebulization QID Mansy, Jan A, MD   3 mL at 05/20/20 1118  . loratadine (CLARITIN) tablet 10 mg  10 mg Oral Daily Mansy, Jan A, MD   10 mg at 05/20/20 0819  . magnesium hydroxide (MILK OF MAGNESIA) suspension 30 mL  30 mL Oral Daily PRN Mansy, Jan A, MD      . magnesium oxide (MAG-OX) tablet 400 mg  400 mg Oral BID AC & HS Mansy, Jan A, MD   400 mg at 05/20/20 5830  . metoCLOPramide (REGLAN) tablet 5 mg  5 mg Oral Q6H PRN Mansy, Jan A, MD      . multivitamin with minerals tablet 1 tablet  1 tablet Oral Daily Mansy, Jan A, MD   1 tablet at 05/20/20 0818  . ondansetron (ZOFRAN) tablet 4 mg  4 mg Oral Q6H PRN Mansy, Jan A, MD       Or  . ondansetron [facility]) injection 4 mg  4 mg Intravenous Q6H PRN Mansy, Jan A, MD      . oxyCODONE (Oxy IR/ROXICODONE) immediate release tablet 5 mg  5 mg Oral Q6H PRN Mansy, Jan A, MD   5 mg at 05/20/20 0824  . oxyCODONE (Oxy IR/ROXICODONE) immediate release tablet 5 mg  5 mg Oral Q6H Mansy, Jan A, MD   5 mg at 05/20/20 1014  . rOPINIRole (REQUIP) tablet 2 mg  2 mg Oral QID Mansy, Jan A, MD   2 mg at 05/20/20 0818  . tiZANidine (ZANAFLEX) tablet 4 mg  4 mg Oral TID Mansy, Jan A, MD   4 mg at 05/20/20 0825  . traZODone (DESYREL) tablet 75 mg  75 mg Oral QHS Mansy, Jan A, MD   75 mg at 05/19/20 2117     Discharge Medications: Please see discharge summary for a list of discharge medications.  Relevant Imaging Results:  Relevant Lab Results:   Additional Information NMM:[id number]  Gerrianne Scale Shyia Fillingim, LCSW

## 2020-05-20 NOTE — Progress Notes (Signed)
PROGRESS NOTE    Cynthia Price  QQP:[id number] DOB: Jul 12, 1946 DOA: 05/18/2020 PCP: [name], MD   Brief Narrative:  HPI on 05/19/2020 by Dr. Eugenie Norrie Ameenah Prosser  is a 74 y.o. female with a known history of multiple sclerosis, DVT on Xarelto, depression, coronary artery disease, IBS, chronic anemia and chronic diastolic CHF who comes from her skilled nursing facility with acute onset of fever for the last couple of days for which she was given Tylenol and was noted to have hypotension that prompted 911 call.  Upon arrival EMS her blood pressure was 60/44 for which she was given IV fluid bolus and blood pressure was up to the 80s in the ER.  No history could be obtained from the patient due to her altered mental status and likely underlying dementia.  Interim history Patient admitted with sepsis secondary to UTI and possibly pneumonia, currently placed on IV antibiotics.  Also noted to have anemia, will hold Xarelto, pending anemia panel as well as FOBT if possible. Assessment & Plan   Sepsis secondary to UTI/pneumonia -Patient presented with fever, hypotension, tachycardia -Chest x-ray showed streaky retrocardiac opacity, patchy right lung base opacity may reflect atelectasis or pneumonia. -UA: Trace leukocytes, negative nitrites, many bacteria, > 50 WBC -Urine culture shows multiple species, Recollection suggested -Blood culture showed no growth to date -Continue azithromycin and ceftriaxone  Acute on chronic macrocytic anemia/ GI Bleed -Hemoglobin dropped to 6.6 this morning, repeat hemoglobin 7.2 -Baseline hemoglobin between 9 and 10 -We will transfuse 1 unit PRBC and continue to monitor -Xarelto currently held -Anemia panel obtained, patient with adequate iron and iron storage.  TIBC 133, folate 16.8 -FOBT positive -Gastroenterology consulted and appreciated   Hypotension -Patient was given IV albumin with IV fluids -BP has stabilized, continue to monitor  closely  Multiple sclerosis -Continue baclofen  Depression -Continue Cymbalta  History of DVT -As above, Xarelto held  DVT Prophylaxis Xarelto held  Code Status: DNR  Family Communication: None at bedside  Disposition Plan:  Status is: Inpatient  Remains inpatient appropriate because:Hemodynamically unstable, Ongoing diagnostic testing needed not appropriate for outpatient work up and IV treatments appropriate due to intensity of illness or inability to take PO   Dispo: The patient is from: SNF              Anticipated d/c is to: SNF              Anticipated d/c date is: > 3 days              Patient currently is not medically stable to d/c.   Consultants Gastroenterology  Procedures  None  Antibiotics   Anti-infectives (From admission, onward)   Start     Dose/Rate Route Frequency Ordered Stop   05/21/20 1000  azithromycin (ZITHROMAX) tablet 500 mg     Discontinue     500 mg Oral  Once 05/20/20 0918     05/19/20 0600  cefTRIAXone (ROCEPHIN) 2 g in sodium chloride 0.9 % 100 mL IVPB     Discontinue     2 g 200 mL/hr over 30 Minutes Intravenous Every 24 hours 05/19/20 0451 05/24/20 0559   05/19/20 0600  azithromycin (ZITHROMAX) 500 mg in sodium chloride 0.9 % 250 mL IVPB  Status:  Discontinued        500 mg 250 mL/hr over 60 Minutes Intravenous Every 24 hours 05/19/20 0451 05/20/20 0918   05/19/20 0045  ceFEPIme (MAXIPIME) 1 g in sodium chloride  0.9 % 100 mL IVPB        1 g 200 mL/hr over 30 Minutes Intravenous  Once 05/19/20 0036 05/19/20 0125   05/19/20 0045  vancomycin (VANCOCIN) IVPB 1000 mg/200 mL premix        1,000 mg 200 mL/hr over 60 Minutes Intravenous  Once 05/19/20 0036 05/19/20 0230      Subjective:   Cynthia Price seen and examined today.  Patient with no complaints today other than pain.  Denies current chest pain, shortness of breath, abdominal pain, nausea or vomiting, diarrhea constipation, dizziness or headache.  Objective:   Vitals:    05/20/20 0729 05/20/20 0736 05/20/20 1121 05/20/20 1135  BP:  113/68  105/63  Pulse:  81  66  Resp:  16  17  Temp:  98.2 F (36.8 C)  98.2 F (36.8 C)  TempSrc:      SpO2: 96% 94% 95% 95%  Weight:      Height:        Intake/Output Summary (Last 24 hours) at 05/20/2020 1242 Last data filed at 05/20/2020 0603 Gross per 24 hour  Intake 1100 ml  Output 200 ml  Net 900 ml   Filed Weights   05/19/20 0801 05/19/20 1720 05/20/20 0348  Weight: 56.7 kg 47.6 kg 48.9 kg    Exam  General: Well developed, chronically ill-appearing, NAD  HEENT: NCAT, mucous membranes moist.   Cardiovascular: S1 S2 auscultated, RRR  Respiratory: Diminished breath sounds, few expiratory wheezes.  Abdomen: Soft, nontender, nondistended, + bowel sounds  Extremities: warm dry without cyanosis clubbing or edema of LE. LUE edema. Abrasion, right ankle. Contracted  Neuro: AAOx2 (self and place), dementia, nonfocal  Psych: Appropriate mood and affect   Data Reviewed: I have personally reviewed following labs and imaging studies  CBC: Recent Labs  Lab 05/19/20 0009 05/19/20 0503 05/19/20 1840 05/19/20 2353 05/20/20 0708  WBC 10.4 9.7  --   --   --   NEUTROABS 8.6*  --   --   --   --   HGB 8.3* 7.2* 7.4* 6.6* 7.2*  HCT 26.4* 23.6* 24.0* 20.6* 23.3*  MCV 101.9* 103.1*  --   --   --   PLT 302 260  --   --   --    Basic Metabolic Panel: Recent Labs  Lab 05/19/20 0009 05/19/20 0503  NA 137 142  K 4.2 3.9  CL 98 105  CO2 31 30  GLUCOSE 138* 128*  BUN 23 21  CREATININE 0.81 0.66  CALCIUM 8.1* 8.4*   GFR: Estimated Creatinine Clearance: 47.6 mL/min (by C-G formula based on SCr of 0.66 mg/dL). Liver Function Tests: Recent Labs  Lab 05/19/20 0009  AST 18  ALT 10  ALKPHOS 72  BILITOT 0.6  PROT 6.3*  ALBUMIN 2.7*   No results for input(s): LIPASE, AMYLASE in the last 168 hours. No results for input(s): AMMONIA in the last 168 hours. Coagulation Profile: Recent Labs  Lab  05/19/20 0503  INR 1.7*   Cardiac Enzymes: No results for input(s): CKTOTAL, CKMB, CKMBINDEX, TROPONINI in the last 168 hours. BNP (last 3 results) No results for input(s): PROBNP in the last 8760 hours. HbA1C: No results for input(s): HGBA1C in the last 72 hours. CBG: No results for input(s): GLUCAP in the last 168 hours. Lipid Profile: No results for input(s): CHOL, HDL, LDLCALC, TRIG, CHOLHDL, LDLDIRECT in the last 72 hours. Thyroid Function Tests: No results for input(s): TSH, T4TOTAL, FREET4, T3FREE, THYROIDAB in the last  72 hours. Anemia Panel: Recent Labs    05/20/20 0708  FOLATE 16.1  FERRITIN 163  TIBC 133*  IRON 32  RETICCTPCT 1.6   Urine analysis:    Component Value Date/Time   COLORURINE AMBER (A) 05/19/2020 0010   APPEARANCEUR TURBID (A) 05/19/2020 0010   APPEARANCEUR Clear 09/25/2015 1020   LABSPEC 1.016 05/19/2020 0010   LABSPEC 1.011 11/02/2014 1914   PHURINE 8.0 05/19/2020 0010   GLUCOSEU NEGATIVE 05/19/2020 0010   GLUCOSEU Negative 11/02/2014 1914   HGBUR NEGATIVE 05/19/2020 0010   HGBUR trace-intact 04/29/2010 0952   BILIRUBINUR NEGATIVE 05/19/2020 0010   BILIRUBINUR Negative 09/25/2015 1020   BILIRUBINUR Negative 11/02/2014 1914   KETONESUR 5 (A) 05/19/2020 0010   PROTEINUR 100 (A) 05/19/2020 0010   UROBILINOGEN negative 02/04/2014 1603   UROBILINOGEN 0.2 04/29/2010 0952   NITRITE NEGATIVE 05/19/2020 0010   LEUKOCYTESUR TRACE (A) 05/19/2020 0010   LEUKOCYTESUR Negative 11/02/2014 1914   Sepsis Labs: @LABRCNTIP (procalcitonin:4,lacticidven:4)  ) Recent Results (from the past 240 hour(s))  Blood culture (routine x 2)     Status: None (Preliminary result)   Collection Time: 05/19/20 12:10 AM   Specimen: Right Antecubital; Blood  Result Value Ref Range Status   Specimen Description RIGHT ANTECUBITAL  Final   Special Requests   Final    BOTTLES DRAWN AEROBIC AND ANAEROBIC Blood Culture results may not be optimal due to an excessive volume of  blood received in culture bottles   Culture   Final    NO GROWTH 1 DAY Performed at [facility], [address], [place], [place] [address]    Report Status PENDING  Incomplete  Respiratory Panel by RT PCR (Flu A&B, Covid) - Nasopharyngeal Swab     Status: None   Collection Time: 05/19/20 12:10 AM   Specimen: Nasopharyngeal Swab  Result Value Ref Range Status   SARS Coronavirus 2 by RT PCR NEGATIVE NEGATIVE Final    Comment: (NOTE) SARS-CoV-2 target nucleic acids are NOT DETECTED.  The SARS-CoV-2 RNA is generally detectable in upper respiratoy specimens during the acute phase of infection. The lowest concentration of SARS-CoV-2 viral copies this assay can detect is 131 copies/mL. A negative result does not preclude SARS-Cov-2 infection and should not be used as the sole basis for treatment or other patient management decisions. A negative result may occur with  improper specimen collection/handling, submission of specimen other than nasopharyngeal swab, presence of viral mutation(s) within the areas targeted by this assay, and inadequate number of viral copies (<131 copies/mL). A negative result must be combined with clinical observations, patient history, and epidemiological information. The expected result is Negative.  Fact Sheet for Patients:  PinkCheek.be  Fact Sheet for Healthcare Providers:  GravelBags.it  This test is no t yet approved or cleared by the [place] FDA and  has been authorized for detection and/or diagnosis of SARS-CoV-2 by FDA under an Emergency Use Authorization (EUA). This EUA will remain  in effect (meaning this test can be used) for the duration of the COVID-19 declaration under Section 564(b)(1) of the Act, 21 U.S.C. section 360bbb-3(b)(1), unless the authorization is terminated or revoked sooner.     Influenza A by PCR NEGATIVE NEGATIVE Final   Influenza B by PCR NEGATIVE  NEGATIVE Final    Comment: (NOTE) The Xpert Xpress SARS-CoV-2/FLU/RSV assay is intended as an aid in  the diagnosis of influenza from Nasopharyngeal swab specimens and  should not be used as a sole basis for treatment. Nasal washings and  aspirates are unacceptable for Xpert Xpress SARS-CoV-2/FLU/RSV  testing.  Fact Sheet for Patients: PinkCheek.be  Fact Sheet for Healthcare Providers: GravelBags.it  This test is not yet approved or cleared by the [place] FDA and  has been authorized for detection and/or diagnosis of SARS-CoV-2 by  FDA under an Emergency Use Authorization (EUA). This EUA will remain  in effect (meaning this test can be used) for the duration of the  Covid-19 declaration under Section 564(b)(1) of the Act, 21  U.S.C. section 360bbb-3(b)(1), unless the authorization is  terminated or revoked. Performed at [facility], [address] [place], [place] [address]   Urine culture     Status: Abnormal   Collection Time: 05/19/20 12:11 AM   Specimen: Urine, Clean Catch  Result Value Ref Range Status   Specimen Description   Final    URINE, CLEAN CATCH Performed at [facility], [address], [place], [place] [address]    Special Requests   Final    NONE Performed at [facility], [address] [place], [place] [address]    Culture MULTIPLE SPECIES PRESENT, SUGGEST RECOLLECTION (A)  Final   Report Status 05/20/2020 FINAL  Final  Blood culture (routine x 2)     Status: None (Preliminary result)   Collection Time: 05/19/20 12:27 AM   Specimen: BLOOD RIGHT HAND  Result Value Ref Range Status   Specimen Description BLOOD RIGHT HAND  Final   Special Requests   Final    BOTTLES DRAWN AEROBIC AND ANAEROBIC Blood Culture adequate volume   Culture   Final    NO GROWTH 1 DAY Performed at [facility], [address] [place], [place] [address]    Report Status  PENDING  Incomplete  MRSA PCR Screening     Status: None   Collection Time: 05/19/20  6:31 PM   Specimen: Nasopharyngeal  Result Value Ref Range Status   MRSA by PCR NEGATIVE NEGATIVE Final    Comment:        The GeneXpert MRSA Assay (FDA approved for NASAL specimens only), is one component of a comprehensive MRSA colonization surveillance program. It is not intended to diagnose MRSA infection nor to guide or monitor treatment for MRSA infections. Performed at [facility], [address] [place], [place] [address]       Radiology Studies: DG Chest Portable 1 View  Result Date: 05/19/2020 CLINICAL DATA:  Sepsis.  Hypotension.  Fever. EXAM: PORTABLE CHEST 1 VIEW COMPARISON:  08/19/2017 FINDINGS: Patient's chin obscures the apices. Significant patient rotation. Stable heart size and mediastinal contours. Streaky retrocardiac opacity. Patchy right lung base opacity. Possible small pleural effusions. No pneumothorax. No pulmonary edema. Bones under mineralized with scoliotic curvature of the spine. IMPRESSION: 1. Streaky retrocardiac opacity and patchy right lung base opacity, may be atelectasis or pneumonia. Possible small pleural effusions. 2. Rotated exam. Electronically Signed   By: Keith Rake M.D.   On: 05/19/2020 01:01     Scheduled Meds: . [START ON 05/21/2020] azithromycin  500 mg Oral Once  . baclofen  20 mg Oral TID  . diclofenac sodium  4 g Topical QID  . escitalopram  20 mg Oral Daily  . famotidine  20 mg Oral BID  . fluticasone  1 spray Each Nare Daily  . gabapentin  600 mg Oral QID  . guaiFENesin  600 mg Oral BID  . hydroxyurea  500 mg Oral Q MTWThF  . ipratropium-albuterol  3 mL Nebulization QID  .  loratadine  10 mg Oral Daily  . magnesium oxide  400 mg Oral BID AC & HS  . multivitamin with minerals  1 tablet Oral Daily  . oxyCODONE  5 mg Oral Q6H  . rOPINIRole  2 mg Oral QID  . tiZANidine  4 mg Oral TID  . traZODone  75 mg Oral QHS    Continuous Infusions: . sodium chloride 100 mL/hr at 05/19/20 1828  . cefTRIAXone (ROCEPHIN)  IV 2 g (05/20/20 0603)     LOS: 1 day   Time Spent in minutes   45 minutes  Jo-Anne Kluth D.O. on 05/20/2020 at 12:42 PM  Between 7am to 7pm - Please see pager noted on [email or web address]  After 7pm go to [email or web address]  And look for the night coverage person covering for me after hours  [facility] Office  [phone]

## 2020-05-20 NOTE — TOC Initial Note (Signed)
Transition of Care [facility]) - Initial/Assessment Note    Patient Details  Name: Cynthia Price MRN: [id number] Date of Birth: 07/21/1946  Transition of Care [facility]) CM/SW Contact:    Eileen Stanford, LCSW Phone Number: 05/20/2020, 1:33 PM  Clinical Narrative:   Pt is only alert to self and place. CSW spoke with pt's Son via telephone. Pt's son states pt has been at [facility] for 3 years. Pt's son confirms d/c plan is for pt to return to [facility]. CSW will reach out to facility.                Expected Discharge Plan: [place] Barriers to Discharge: Continued Medical Work up   Patient Goals and CMS Choice Patient states their goals for this hospitalization and ongoing recovery are:: pt to return to [employer] offered to / list presented to : Adult Children  Expected Discharge Plan and Services Expected Discharge Plan: [place] In-house Referral: Clinical Social Work   Post Acute Care Choice: [place] Living arrangements for the past 2 months: [place]                                      Prior Living Arrangements/Services Living arrangements for the past 2 months: [place] Lives with:: Self Patient language and need for interpreter reviewed:: Yes Do you feel safe going back to the place where you live?: Yes      Need for Family Participation in Patient Care: Yes (Comment) Care giver support system in place?: Yes (comment)   Criminal Activity/Legal Involvement Pertinent to Current Situation/Hospitalization: No - Comment as needed  Activities of Daily Living Home Assistive Devices/Equipment: Hospital bed ADL Screening (condition at time of admission) Patient's cognitive ability adequate to safely complete daily activities?: Yes Is the patient deaf or have difficulty hearing?: No Does the patient have difficulty seeing, even when wearing glasses/contacts?: No Does  the patient have difficulty concentrating, remembering, or making decisions?: Yes Patient able to express need for assistance with ADLs?: Yes Does the patient have difficulty dressing or bathing?: No Independently performs ADLs?: No Communication: Independent Dressing (OT): Dependent Is this a change from baseline?: Pre-admission baseline Grooming: Dependent Is this a change from baseline?: Pre-admission baseline Feeding: Needs assistance Is this a change from baseline?: Pre-admission baseline Bathing: Dependent Is this a change from baseline?: Pre-admission baseline Toileting: Dependent Is this a change from baseline?: Pre-admission baseline In/Out Bed: Dependent Is this a change from baseline?: Pre-admission baseline Walks in Home: Dependent Is this a change from baseline?: Pre-admission baseline Does the patient have difficulty walking or climbing stairs?: Yes Weakness of Legs: Both Weakness of Arms/Hands: Both  Permission Sought/Granted Permission sought to share information with : Family Supports    Share Information with NAME: [name]  Permission granted to share info w AGENCY: Dietitian granted to share info w Relationship: Son     Emotional Assessment Appearance:: Appears stated age Attitude/Demeanor/Rapport: Unable to Assess Affect (typically observed): Unable to Assess Orientation: : Oriented to Self, Oriented to Place Alcohol / Substance Use: Not Applicable Psych Involvement: No (comment)  Admission diagnosis:  Healthcare-associated pneumonia [J18.9] Acute cystitis with hematuria [N30.01] Septic shock ([employer]) [A41.9, R65.21] Sepsis ([place]) [A41.9] Acute on chronic anemia [D64.9] Sepsis, due to unspecified organism, unspecified whether acute organ dysfunction present [facility]) [A41.9] Patient Active Problem List  Diagnosis Date Noted  . Sepsis ([place]) 05/19/2020  . Left shoulder pain 04/16/2020  . Insomnia 05/02/2019  . Neurogenic pain 12/17/2018  .  Palliative care encounter 12/13/2018  . Shortness of breath 12/13/2018  . Weakness generalized 12/13/2018  . 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 Abnormal MRI, cervical spine (07/27/2018) 10/09/2018  . Cervical central spinal stenosis 10/09/2018  . Cervical foraminal stenosis 10/09/2018  . Cervical facet arthropathy 10/09/2018  . Cervicalgia 10/09/2018  . Cervical facet syndrome (Bilateral) (R>L) 10/09/2018  . Chronic upper extremity pain (Secondary Area of Pain) (Bilateral) (R>L) 09/17/2018  . Long term current use of anticoagulant therapy (Xarelto) 09/17/2018  . DDD (degenerative disc disease), cervical 09/17/2018  . Chronic neck pain (Primary Area of Pain) (Bilateral) (R>L) 08/27/2018  . Chronic low back pain [facility] Area of Pain) (Bilateral) (L>R) w/ sciatica (Bilateral) 08/27/2018  . Chronic knee pain (Fourth Area of Pain) (Bilateral) (L>R) 08/27/2018  . Chronic lower extremity pain (Bilateral) (L>R) 08/27/2018  . Chronic pain syndrome 08/27/2018  . Pharmacologic therapy 08/27/2018  . Disorder of skeletal system 08/27/2018  . Problems influencing health status 08/27/2018  . Long term current use of opiate analgesic 08/27/2018  . Degenerative cervical spinal stenosis 08/06/2018  . Resides in skilled nursing facility 08/06/2018  . Rash 06/19/2018  . Muscle spasticity 05/17/2018  . Inability to walk 05/17/2018  . Mild protein-calorie malnutrition ([place]) 01/03/2018  . Ulcer, skin, non-healing, limited to breakdown of skin ([place]) 12/19/2017  . Intractable pain 08/19/2017  . Chronic deep vein thrombosis (DVT) of proximal vein of left lower extremity ([place]) 05/16/2017  . Seizure-like activity ([place]) 04/20/2017  . Bladder mass 01/20/2017  . Encounter for general adult medical examination without abnormal findings 11/24/2016  . Spondylosis of lumbar region without myelopathy or radiculopathy 05/23/2016  . 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 Urinary retention with incomplete bladder  emptying 12/22/2015  . ET (essential thrombocythemia) ([place]) 12/22/2015  . DS (disseminated sclerosis) ([place]) 09/15/2015  . Rotator cuff tendinitis (Right) 06/19/2015  . Back sprain 06/05/2015  . Chest wall contusion 06/05/2015  . Effusion of knee 05/22/2015  . Arthritis of knee, degenerative 05/22/2015  . Effusion of left knee 05/22/2015  . Osteoarthritis of knee (Left) 05/22/2015  . Contusion of shoulder 04/14/2015  . 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[place]) 03/05/2015  . Mild pulmonary hypertension ([place]) 03/05/2015  . Complete rotator cuff rupture of shoulder (Left) 02/05/2015  . Infraspinatus tenosynovitis 02/05/2015  . Complete tear of left rotator cuff 02/05/2015  . Chronic diastolic heart failure ([place]) 09/07/2014  . Deep vein thrombosis ([place]) 07/10/2014  . History of deep vein thrombosis (DVT) of lower extremity 07/10/2014  . HLD (hyperlipidemia) 03/08/2014  . BP (high blood pressure) 03/08/2014  . Hyperlipidemia 03/08/2014  . Hypertension 03/08/2014  . UTI (urinary tract infection) 02/05/2014  . Cellulitis, toe 08/18/2013  . Bradycardia 03/26/2013  . Hypotension 03/26/2013  . RLS (restless legs syndrome) 08/31/2012  . Constipation 07/20/2012  . Prolapse of urethra 07/20/2012  . Frequent UTI 07/20/2012  . Neurogenic dysfunction of the urinary bladder 07/20/2012  . Vaginal atrophy 07/20/2012  . Recurrent UTI 07/20/2012  . Neuromuscular dysfunction of bladder 07/20/2012  . Prolapse urethral mucosa 07/20/2012  . DVT (deep venous thrombosis) ([place]) 05/14/2012  . Risk for falls 03/16/2012  . Lumbar canal stenosis 02/16/2012  . Lumbosacral radiculitis 02/16/2012  . Restless leg 01/20/2012  . 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 Restless legs syndrome (RLS) 01/20/2012  . Urinary retention 12/04/2011  . CAD (coronary  artery disease) 10/18/2011  . Anemia 10/17/2011  . Diastolic dysfunction 16/08/9603  . Tachycardia 10/01/2011  . Thrombocythemia,  essential ([place]) 05/26/2011  . Edema 05/26/2011  . HYPERCHOLESTEROLEMIA 04/29/2010  . Depression 04/29/2010  . CYSTOCELE WITHOUT MENTION UTERINE PROLAPSE LAT 04/29/2010  . UNS ADVRS EFF OTH RX MEDICINAL&BIOLOGICAL SBSTNC 04/29/2010  . Depressive disorder, not elsewhere classified 04/29/2010  . Vitamin D deficiency 04/29/2010  . Multiple sclerosis ([place]) 04/29/2008  . NEUROPATHY 04/29/2008  . FATIGUE 04/29/2008   PCP:  [name], MD Pharmacy:   [facility], [address] [place] [address] Phone: [phone] Fax: [phone]     Social Determinants of Health (SDOH) Interventions    Readmission Risk Interventions No flowsheet data found.

## 2020-05-20 NOTE — Consult Note (Signed)
GI Inpatient Consult Note  Reason for Consult: Heme positive stool   Attending Requesting Consult: Dr. Cristal Ford, DO  History of Present Illness: Cynthia Price is a 74 y.o. female seen for evaluation of heme positive stool and anemia at the request of Dr. Ree Kida. Pt has a PMH of multiple sclerosis, Hx of DVT on Xarelto, depression, IBS, chronic anemia, and chronic diastolic CHF. Pt was admitted from SNF with presumptive sepsis 2/2 UTI and community-acquired pneumonia. She was placed on IV antibiotics. GI was consulted due to labs showing hemoglobin 6.6 and heme positive stool. Hemoglobin on admission was 8.3 with MCV 101.9 and decreased to 7.2 yesterday with MCV 103. She had further anemia work-up which showed ferritin 163, iron sat 24%, TIBC 133, folate 16.1, Vit B12 pending, and reticulocyte count 1.6%. Xarelto was held and patient planning to get 1 unit pRBCs this afternoon. Baseline hemoglobin in March 2020 was 9.8. 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 Patient's best friend of many years is present with her in the room today. Patient has been a resident at [facility] for the past 3 years. Her son and daughter are very close with her. She denies any previous history of GI bleeding. She reports she doesn't look at her stools and cannot tell me the color of her stools. Per chart review, she had large BM with brown loose stool yesterday morning with no overt hematochezia or melena. Per nursing, BM collected today was brown with no overt hematochezia or tarry stool. She has a history of constipation and normally has a BM about every 2-3 days likely exacerbated by her chronic narcotic pain medications. Her best friend present in the room today reports since coronavirus pandemic started, she thinks the patient has lost 25 lbs. She is not being fed properly according to her friend as her meal  time usually takes 1-1.5 hours to finish a meal as she takes very small bites. Pt and friend bring to my attention new complaint of oropharyngeal dysphagia where she feels like some solid foods and liquids are getting stuck at level just above her thyroid cartilage. She tried to eat some fish and okra and felt like it was getting stuck and had to spit it back up. She endorses chronic nausea without any vomiting. There is no complaint of odynophagia, early satiety, abdominal pain.    Last Colonoscopy: > 15 years ago, reportedly normal  Last Endoscopy:    Past Medical History:  Past Medical History:  Diagnosis Date   Anemia 2012   from labs at [facility]   Back pain    s/p hemilaminectomy with h/o herniated disc at L4L5   Blood clot in vein    behind left knee   BP (high blood pressure) 03/08/2014   Last Assessment & Plan:  Blood pressure has been controlled without significant dizzyness or associated fatigue. Taking antihypertensives as directed without difficulty.     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[place]) 03/05/2015   Hypotension 03/26/2013   IBS (irritable bowel syndrome)    Insomnia  Lupus anticoagulant positive    Macular degeneration    MS (multiple sclerosis) ([place])    Multiple sclerosis ([place]) 04/29/2008   Per neurology at [facility] 04/29/2008   Per Neurology at [facility]     RLS (restless legs syndrome)    Spinal stenosis    lumbar   Urinary retention    with high post void residuals 2013, per [facility]    Problem List: Patient Active Problem List   Diagnosis Date Noted   Sepsis ([place]) 05/19/2020   Left shoulder pain  04/16/2020   Insomnia 05/02/2019   Neurogenic pain 12/17/2018   Palliative care encounter 12/13/2018   Shortness of breath 12/13/2018   Weakness generalized 12/13/2018   Spastic diplegia ([place]) 11/13/2018   Abnormal MRI, cervical spine (07/27/2018) 10/09/2018   Cervical central spinal stenosis 10/09/2018   Cervical foraminal stenosis 10/09/2018   Cervical facet arthropathy 10/09/2018   Cervicalgia 10/09/2018   Cervical facet syndrome (Bilateral) (R>L) 10/09/2018   Chronic upper extremity pain (Secondary Area of Pain) (Bilateral) (R>L) 09/17/2018   Long term current use of anticoagulant therapy (Xarelto) 09/17/2018   DDD (degenerative disc disease), cervical 09/17/2018   Chronic neck pain (Primary Area of Pain) (Bilateral) (R>L) 08/27/2018   Chronic low back pain [facility] Area of Pain) (Bilateral) (L>R) w/ sciatica (Bilateral) 08/27/2018   Chronic knee pain (Fourth Area of Pain) (Bilateral) (L>R) 08/27/2018   Chronic lower extremity pain (Bilateral) (L>R) 08/27/2018   Chronic pain syndrome 08/27/2018   Pharmacologic therapy 08/27/2018   Disorder of skeletal system 08/27/2018   Problems influencing health status 08/27/2018   Long term current use of opiate analgesic 08/27/2018   Degenerative cervical spinal stenosis 08/06/2018   Resides in skilled nursing facility 08/06/2018   Rash 06/19/2018   Muscle spasticity 05/17/2018   Inability to walk 05/17/2018   Mild protein-calorie malnutrition ([place]) 01/03/2018   Ulcer, skin, non-healing, limited to breakdown of skin ([place]) 12/19/2017   Intractable pain 08/19/2017   Chronic deep vein thrombosis (DVT) of proximal vein of left lower extremity ([place]) 05/16/2017   Seizure-like activity ([place]) 04/20/2017   Bladder mass 01/20/2017   Encounter for general adult medical examination without abnormal findings 11/24/2016   Spondylosis of lumbar region without myelopathy or radiculopathy 05/23/2016   Uterine  procidentia 02/25/2016   Urinary retention with incomplete bladder emptying 12/22/2015   ET (essential thrombocythemia) ([place]) 12/22/2015   DS (disseminated sclerosis) ([place]) 09/15/2015   Rotator cuff tendinitis (Right) 06/19/2015   Back sprain 06/05/2015   Chest wall contusion 06/05/2015   Effusion of knee 05/22/2015   Arthritis of knee, degenerative 05/22/2015   Effusion of left knee 05/22/2015   Osteoarthritis of knee (Left) 05/22/2015   Contusion of shoulder 04/14/2015   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[place]) 03/05/2015   Mild pulmonary hypertension ([place]) 03/05/2015   Complete rotator cuff rupture of shoulder (Left) 02/05/2015   Infraspinatus tenosynovitis 02/05/2015   Complete tear of left rotator cuff 02/05/2015   Chronic diastolic heart failure ([place]) 09/07/2014   Deep vein thrombosis ([place]) 07/10/2014   History of deep vein thrombosis (DVT) of lower extremity 07/10/2014   HLD (hyperlipidemia) 03/08/2014   BP (high blood pressure) 03/08/2014   Hyperlipidemia 03/08/2014   Hypertension 03/08/2014   UTI (urinary tract infection) 02/05/2014   Cellulitis, toe 08/18/2013   Bradycardia 03/26/2013   Hypotension 03/26/2013   RLS (restless legs syndrome) 08/31/2012   Constipation 07/20/2012   Prolapse of urethra 07/20/2012   Frequent UTI 07/20/2012  Neurogenic dysfunction of the urinary bladder 07/20/2012   Vaginal atrophy 07/20/2012   Recurrent UTI 07/20/2012   Neuromuscular dysfunction of bladder 07/20/2012   Prolapse urethral mucosa 07/20/2012   DVT (deep venous thrombosis) ([place]) 05/14/2012   Risk for falls 03/16/2012   Lumbar canal stenosis 02/16/2012   Lumbosacral radiculitis 02/16/2012   Restless leg 01/20/2012   Multiple sclerosis, 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[place]) 01/20/2012   Restless legs syndrome (RLS) 01/20/2012   Urinary retention 12/04/2011   CAD (coronary artery disease) 10/18/2011   Anemia 47/82/9562   Diastolic  dysfunction 13/06/6577   Tachycardia 10/01/2011   Thrombocythemia, essential ([place]) 05/26/2011   Edema 05/26/2011   HYPERCHOLESTEROLEMIA 04/29/2010   Depression 04/29/2010   CYSTOCELE WITHOUT MENTION UTERINE PROLAPSE LAT 04/29/2010   UNS ADVRS EFF OTH RX MEDICINAL&BIOLOGICAL SBSTNC 04/29/2010   Depressive disorder, not elsewhere classified 04/29/2010   Vitamin D deficiency 04/29/2010   Multiple sclerosis ([place]) 04/29/2008   NEUROPATHY 04/29/2008   FATIGUE 04/29/2008    Past Surgical History: Past Surgical History:  Procedure Laterality Date   ANTERIOR LUMBAR FUSION  2012   APPENDECTOMY     CHOLECYSTECTOMY     CHOLECYSTECTOMY     LUMBAR DISC SURGERY     LUMBAR LAMINECTOMY     TONSILLECTOMY      Allergies: Allergies  Allergen Reactions   Atorvastatin     Muscle aches at >40mg      Home Medications: Medications Prior to Admission  Medication Sig Dispense Refill Last Dose   acetaminophen (TYLENOL) 325 MG tablet Take 650 mg by mouth every 4 (four) hours as needed. Give 2 tablets by mouth every 4 hours prn for general discomfort. Do not exceed 3 grams of APAP/day from all sources   PRN at PRN   baclofen (LIORESAL) 20 MG tablet Take 20 mg by mouth 3 (three) times daily.   Unknown at Unknown   diclofenac sodium (VOLTAREN) 1 % GEL Apply 4 g topically in the morning and at bedtime. Apply to left shoulder and left knee   Unknown at Unknown   escitalopram (LEXAPRO) 20 MG tablet Take 20 mg by mouth daily.    Unknown at Unknown   fluticasone (FLONASE) 50 MCG/ACT nasal spray Place 1 spray into both nostrils daily.   Unknown at Unknown   gabapentin (NEURONTIN) 600 MG tablet Take 600 mg by mouth in the morning, at noon, in the evening, and at bedtime.   Unknown at Unknown   hydroxyurea (HYDREA) 500 MG capsule Take one by mouth Monday, Tuesday, Wed, Thursday, Friday (Patient taking differently: Take 500 mg by mouth every Monday, Tuesday, Wednesday, Thursday, and Friday.  ) 90 capsule 3 Unknown at Unknown   loratadine (CLARITIN) 10 MG tablet Take 10 mg by mouth daily.   Unknown at Unknown   Magnesium 500 MG CAPS Take 1 capsule (500 mg total) by mouth 2 (two) times daily at 8 am and 10 pm. 180 capsule 0 Unknown at Unknown   metoCLOPramide (REGLAN) 5 MG tablet Take 5 mg by mouth. 1 tablet by mouth before meals and at bedtime.   Unknown at Unknown   Multiple Vitamin (MULTIVITAMIN) tablet Take 1 tablet by mouth daily.   Unknown at Unknown   oxyCODONE (OXY IR/ROXICODONE) 5 MG immediate release tablet Take 1 tablet (5 mg total) by mouth every 6 (six) hours as needed for up to 30 days for severe pain. Must Last 30 days. 120 tablet 0 PRN at PRN   rOPINIRole (REQUIP) 2 MG tablet Take 2 mg  by mouth 4 (four) times daily.   Unknown at Unknown   tamsulosin (FLOMAX) 0.4 MG CAPS capsule Take 0.4 mg by mouth daily.   Unknown at Unknown   tiZANidine (ZANAFLEX) 4 MG tablet Take 1 tablet (4 mg total) by mouth every 8 (eight) hours as needed for muscle spasms. (Patient taking differently: Take 4 mg by mouth in the morning, at noon, and at bedtime. ) 90 tablet 2 Unknown at Unknown   traZODone (DESYREL) 50 MG tablet Take 75 mg by mouth at bedtime.    Unknown at Unknown   XARELTO 20 MG TABS tablet Take 20 mg by mouth daily with breakfast.    Unknown at Unknown   AMPYRA 10 MG TB12 Take 10 mg by mouth 2 (two) times daily.  (Patient not taking: Reported on 05/19/2020)   Not Taking at Unknown time   oxyCODONE (OXY IR/ROXICODONE) 5 MG immediate release tablet Take 1 tablet (5 mg total) by mouth every 6 (six) hours as needed for up to 30 days for severe pain. Must last 30 days. (Patient taking differently: Take 5 mg by mouth every 6 (six) hours. Must last 30 days.) 120 tablet 0    oxyCODONE (OXY IR/ROXICODONE) 5 MG immediate release tablet Take 1 tablet (5 mg total) by mouth every 6 (six) hours as needed for up to 30 days for severe pain. Must last 30 days. 120 tablet 0    Home  medication reconciliation was completed with the patient.   Scheduled Inpatient Medications:    sodium chloride   Intravenous Once   [START ON 05/21/2020] azithromycin  500 mg Oral Once   baclofen  20 mg Oral TID   diclofenac sodium  4 g Topical QID   escitalopram  20 mg Oral Daily   famotidine  20 mg Oral BID   fluticasone  1 spray Each Nare Daily   gabapentin  600 mg Oral QID   guaiFENesin  600 mg Oral BID   hydroxyurea  500 mg Oral Q MTWThF   ipratropium-albuterol  3 mL Nebulization QID   loratadine  10 mg Oral Daily   magnesium oxide  400 mg Oral BID AC & HS   multivitamin with minerals  1 tablet Oral Daily   oxyCODONE  5 mg Oral Q6H   rOPINIRole  2 mg Oral QID   tiZANidine  4 mg Oral TID   traZODone  75 mg Oral QHS    Continuous Inpatient Infusions:    sodium chloride 100 mL/hr at 05/19/20 1828   cefTRIAXone (ROCEPHIN)  IV 2 g (05/20/20 0603)    PRN Inpatient Medications:  acetaminophen **OR** acetaminophen, chlorpheniramine-HYDROcodone, magnesium hydroxide, metoCLOPramide, ondansetron **OR** ondansetron (ZOFRAN) IV, oxyCODONE  Family History: family history includes Alcohol abuse in her father; Diabetes in her brother and sister; Heart disease in her father and mother; Multiple sclerosis in her cousin and paternal aunt; Ovarian cancer in her mother; Thyroid cancer in her mother.  The patient's family history is negative for inflammatory bowel disorders, GI malignancy, or solid organ transplantation.  Social History:   reports that she has never smoked. She has never used smokeless tobacco. She reports that she does not drink alcohol and does not use drugs. The patient denies ETOH, tobacco, or drug use.   Review of Systems: Constitutional: + weight loss, no chills, fever, night sweats Eyes: No changes in vision. ENT: No oral lesions, sore throat.  GI: see HPI.  Heme/Lymph: No easy bruising.  CV: No chest pain.  GU:  No hematuria.  Integumentary:  No rashes.  Neuro: No headaches.  Psych: No depression/anxiety.  Endocrine: No heat/cold intolerance.  Allergic/Immunologic: No urticaria.  Resp: No cough, SOB.  Musculoskeletal: No joint swelling.    Physical Examination: BP 105/63 (BP Location: Right Arm)    Pulse 66    Temp 98.2 F (36.8 C)    Resp 17    Ht 5\' 5"  (1.651 m)    Wt 48.9 kg    SpO2 95%    BMI 17.94 kg/m   Chronically ill-appearing female in hospital bed. Friend present bedside.  Gen: NAD, alert and oriented to self and place HEENT: PEERLA, EOMI, Neck: supple, no JVD or thyromegaly Chest: CTA bilaterally, no wheezes, crackles, or other adventitious sounds CV: RRR, no m/g/c/r Abd: soft, NT, ND, +BS in all four quadrants; no HSM, guarding, ridigity, or rebound tenderness Ext: no edema, contracted Skin: no rash or lesions noted Lymph: no LAD  Data: Lab Results  Component Value Date   WBC 9.7 05/19/2020   HGB 7.2 (L) 05/20/2020   HCT 23.3 (L) 05/20/2020   MCV 103.1 (H) 05/19/2020   PLT 260 05/19/2020   Recent Labs  Lab 05/19/20 1840 05/19/20 2353 05/20/20 0708  HGB 7.4* 6.6* 7.2*   Lab Results  Component Value Date   NA 142 05/19/2020   K 3.9 05/19/2020   CL 105 05/19/2020   CO2 30 05/19/2020   BUN 21 05/19/2020   CREATININE 0.66 05/19/2020   Lab Results  Component Value Date   ALT 10 05/19/2020   AST 18 05/19/2020   ALKPHOS 72 05/19/2020   BILITOT 0.6 05/19/2020   Recent Labs  Lab 05/19/20 0503 05/19/20 1840  APTT  --  60*  INR 1.7*  --    Assessment/Plan:  74 y/o Caucasian female with a PMH of PMH of multiple sclerosis, Hx of DVT on Xarelto, depression, IBS, chronic anemia, and chronic diastolic CHF admitted for sepsis 2/2 UTI and CAP on IV antibiotics. GI consulted for anemia and heme positive stool  1. Anemia, macrocytic - iron studies within normal limits, normal retic count, normal folate and Vit B12. She has history of chronic anemia previously followed by [facility].   2.  Heme positive stool - no overt signs of hematochezia or melena   3. Sepsis 2/2 UTI and CAP - receiving IV antibiotics  4. Oropharyngeal dysphagia - DDx includes muscular causes, neurogenic, 2/2 multiple sclerosis, or structural abnromality  5. Hx of recurrent DVT - Xarelto currently on hold  6. DNR status  Recommendations:  1. No signs of overt gastrointestinal blood loss. Patient has chronic anemia and anemia is likely dilutional versus anemia of chronic disease 2. No plans for urgent endoscopic procedures 3. Agree with transfusion as necessary to ensure Hgb >7.0. 4. I have ordered speech evaluation and treatment for bedside MBSS and dietary recommendations 5. Following     Thank you for the consult. Please call with questions or concerns.  Reeves Forth [facility] [phone] (Cell)

## 2020-05-20 NOTE — Plan of Care (Signed)
Patient receiving blood transfusion at this time, no adverse reaction noted, patient education about transfusion reaction.    Problem: Respiratory: Goal: Ability to maintain adequate ventilation will improve Outcome: Progressing   Problem: Clinical Measurements: Goal: Signs and symptoms of infection will decrease Outcome: Progressing   Problem: Education: Goal: Ability to identify signs and symptoms of gastrointestinal bleeding will improve Outcome: Progressing

## 2020-05-21 ENCOUNTER — Encounter: Payer: Self-pay | Admitting: Family Medicine

## 2020-05-21 ENCOUNTER — Other Ambulatory Visit: Payer: Self-pay

## 2020-05-21 ENCOUNTER — Inpatient Hospital Stay: Payer: Medicare Other

## 2020-05-21 LAB — BASIC METABOLIC PANEL
Anion gap: 7 (ref 5–15)
BUN: 11 mg/dL (ref 8–23)
CO2: 26 mmol/L (ref 22–32)
Calcium: 8.1 mg/dL — ABNORMAL LOW (ref 8.9–10.3)
Chloride: 108 mmol/L (ref 98–111)
Creatinine, Ser: 0.64 mg/dL (ref 0.44–1.00)
GFR calc Af Amer: >60 mL/min (ref >60)
GFR calc non Af Amer: >60 mL/min (ref >60)
Glucose, Bld: 105 mg/dL — ABNORMAL HIGH (ref 70–99)
Potassium: 3.9 mmol/L (ref 3.5–5.1)
Sodium: 141 mmol/L (ref 135–145)

## 2020-05-21 LAB — CBC
HCT: 29.6 % — ABNORMAL LOW (ref 36.0–46.0)
Hemoglobin: 9.5 g/dL — ABNORMAL LOW (ref 12.0–15.0)
MCH: 31.3 pg (ref 26.0–34.0)
MCHC: 32.1 g/dL (ref 30.0–36.0)
MCV: 97.4 fL (ref 80.0–100.0)
Platelets: 344 10*3/uL (ref 150–400)
RBC: 3.04 MIL/uL — ABNORMAL LOW (ref 3.87–5.11)
RDW: 16.8 % — ABNORMAL HIGH (ref 11.5–15.5)
WBC: 10.1 10*3/uL (ref 4.0–10.5)
nRBC: 0 % (ref 0.0–0.2)

## 2020-05-21 LAB — LACTIC ACID, PLASMA
Lactic Acid, Venous: 0.7 mmol/L (ref 0.5–1.9)
Lactic Acid, Venous: 0.9 mmol/L (ref 0.5–1.9)

## 2020-05-21 LAB — BODY FLUID CELL COUNT WITH DIFFERENTIAL
Eos, Fluid: 0 %
Lymphs, Fluid: 70 %
Monocyte-Macrophage-Serous Fluid: 18 %
Neutrophil Count, Fluid: 12 %
Total Nucleated Cell Count, Fluid: 1062 cu mm

## 2020-05-21 LAB — PROTEIN, PLEURAL OR PERITONEAL FLUID: Total protein, fluid: <3 g/dL

## 2020-05-21 LAB — GLUCOSE, PLEURAL OR PERITONEAL FLUID: Glucose, Fluid: 124 mg/dL

## 2020-05-21 LAB — LACTATE DEHYDROGENASE, PLEURAL OR PERITONEAL FLUID: LD, Fluid: 16 U/L (ref 3–23)

## 2020-05-21 LAB — PROCALCITONIN: Procalcitonin: 0.11 ng/mL

## 2020-05-21 MED ORDER — ALBUMIN HUMAN 25 % IV SOLN
12.5000 g | Freq: Once | INTRAVENOUS | Status: AC
  Administered 2020-05-21: 12.5 g via INTRAVENOUS
  Filled 2020-05-21: qty 50

## 2020-05-21 MED ORDER — ENSURE ENLIVE PO LIQD
237.0000 mL | Freq: Two times a day (BID) | ORAL | Status: DC
  Administered 2020-05-21 – 2020-05-28 (×8): 237 mL via ORAL

## 2020-05-21 MED ORDER — ACETAMINOPHEN 650 MG RE SUPP: 650.0000 mg | Freq: Four times a day (QID) | RECTAL | Status: DC | PRN

## 2020-05-21 MED ORDER — MIDODRINE HCL 5 MG PO TABS
5.0000 mg | ORAL_TABLET | Freq: Three times a day (TID) | ORAL | Status: DC
  Administered 2020-05-21 – 2020-05-23 (×6): 5 mg via ORAL
  Filled 2020-05-21 (×6): qty 1

## 2020-05-21 MED ORDER — ACETAMINOPHEN 325 MG PO TABS
650.0000 mg | ORAL_TABLET | Freq: Four times a day (QID) | ORAL | Status: DC | PRN
  Administered 2020-05-21 – 2020-05-23 (×3): 650 mg via ORAL
  Filled 2020-05-21 (×3): qty 2

## 2020-05-21 MED ORDER — IOHEXOL 300 MG/ML  SOLN
75.0000 mL | Freq: Once | INTRAMUSCULAR | Status: AC | PRN
  Administered 2020-05-21: 75 mL via INTRAVENOUS

## 2020-05-21 MED ORDER — SODIUM CHLORIDE 0.9 % IV SOLN
2.0000 g | Freq: Two times a day (BID) | INTRAVENOUS | Status: DC
  Administered 2020-05-21 – 2020-05-25 (×9): 2 g via INTRAVENOUS
  Filled 2020-05-21 (×10): qty 2

## 2020-05-21 MED ORDER — SODIUM CHLORIDE 0.9 % IV SOLN
INTRAVENOUS | Status: DC | PRN
  Administered 2020-05-21: 1000 mL via INTRAVENOUS
  Administered 2020-05-22: 10 mL via INTRAVENOUS
  Administered 2020-05-23: 250 mL via INTRAVENOUS
  Administered 2020-05-24: 10 mL via INTRAVENOUS
  Administered 2020-05-25: 250 mL via INTRAVENOUS

## 2020-05-21 MED ORDER — FUROSEMIDE 10 MG/ML IJ SOLN
20.0000 mg | Freq: Two times a day (BID) | INTRAMUSCULAR | Status: DC
  Administered 2020-05-22 – 2020-05-23 (×3): 20 mg via INTRAVENOUS
  Filled 2020-05-21 (×5): qty 2

## 2020-05-21 NOTE — Progress Notes (Signed)
PROGRESS NOTE    Cynthia Price  YPP:[id number] DOB: Jul 11, 1946 DOA: 05/18/2020 PCP: [name], MD   Brief Narrative:  HPI on 05/19/2020 by Dr. [name]  is a 74 y.o. female with a known history of multiple sclerosis, DVT on Xarelto, depression, coronary artery disease, IBS, chronic anemia and chronic diastolic CHF who comes from her skilled nursing facility with acute onset of fever for the last couple of days for which she was given Tylenol and was noted to have hypotension that prompted 911 call.  Upon arrival EMS her blood pressure was 60/44 for which she was given IV fluid bolus and blood pressure was up to the 80s in the ER.  No history could be obtained from the patient due to her altered mental status and likely underlying dementia.  Interim history Patient admitted with sepsis secondary to UTI and possibly pneumonia, currently placed on IV antibiotics.  Also noted to have anemia, Xarelto held as FOBT was positive, GI consulted.  Overnight, patient became febrile with tachycardia and hypoxia, repeat chest x-ray was obtained.  Pulmonary consulted. Assessment & Plan   Sepsis secondary to UTI/pneumonia -Patient presented with fever, hypotension, tachycardia -Overnight, patient became febrile along with tachycardia and hypoxia -Repeat chest x-ray on 05/21/2020: Reviewed and shows worsening of left-sided pleural effusion and pneumonia -UA: Trace leukocytes, negative nitrites, many bacteria, > 50 WBC -Urine culture shows multiple species, Recollection suggested -Blood culture showed no growth to date -Given fever overnight, will broaden antibiotics.  MRCA PCR was negative.  We will place her on cefepime and discontinue azithromycin and ceftriaxone. -Pulmonology consulted and appreciated -CT chest ordered -Pending pulmonology consult, may consult IR for thoracentesis  Acute on chronic macrocytic anemia/ GI Bleed -Hemoglobin had dropped to 6.6 -Baseline hemoglobin  between 9 and 10 -Patient transfused 1 unit PRBC on 05/20/2020, hemoglobin currently 9.5 -Xarelto currently held -Anemia panel obtained, patient with adequate iron and iron storage.  TIBC 133, folate 16.8 -FOBT positive -Gastroenterology consulted and appreciated   Hypotension -Patient was given IV albumin with IV fluids -BP has stabilized, continue to monitor closely  ?  Aspiration/dysphagia -Speech therapy consulted, recommending dysphagia 1 diet with thin liquids  Multiple sclerosis -Continue baclofen  Depression -Continue Cymbalta  History of DVT -As above, Xarelto held  DVT Prophylaxis Xarelto held, will place on SCDs  Code Status: DNR  Family Communication: None at bedside  Disposition Plan:  Status is: Inpatient  Remains inpatient appropriate because:Hemodynamically unstable, Ongoing diagnostic testing needed not appropriate for outpatient work up and IV treatments appropriate due to intensity of illness or inability to take PO   Dispo: The patient is from: SNF              Anticipated d/c is to: SNF              Anticipated d/c date is: > 3 days              Patient currently is not medically stable to d/c.   Consultants Gastroenterology Pulmonology  Procedures  None  Antibiotics   Anti-infectives (From admission, onward)   Start     Dose/Rate Route Frequency Ordered Stop   05/21/20 1200  ceFEPIme (MAXIPIME) 2 g in sodium chloride 0.9 % 100 mL IVPB     Discontinue     2 g 200 mL/hr over 30 Minutes Intravenous Every 12 hours 05/21/20 1121     05/21/20 1000  azithromycin (ZITHROMAX) tablet 500 mg  500 mg Oral  Once 05/20/20 0918 05/21/20 0913   05/19/20 0600  cefTRIAXone (ROCEPHIN) 2 g in sodium chloride 0.9 % 100 mL IVPB  Status:  Discontinued        2 g 200 mL/hr over 30 Minutes Intravenous Every 24 hours 05/19/20 0451 05/21/20 1121   05/19/20 0600  azithromycin (ZITHROMAX) 500 mg in sodium chloride 0.9 % 250 mL IVPB  Status:  Discontinued          500 mg 250 mL/hr over 60 Minutes Intravenous Every 24 hours 05/19/20 0451 05/20/20 0918   05/19/20 0045  ceFEPIme (MAXIPIME) 1 g in sodium chloride 0.9 % 100 mL IVPB        1 g 200 mL/hr over 30 Minutes Intravenous  Once 05/19/20 0036 05/19/20 0125   05/19/20 0045  vancomycin (VANCOCIN) IVPB 1000 mg/200 mL premix        1,000 mg 200 mL/hr over 60 Minutes Intravenous  Once 05/19/20 0036 05/19/20 0230      Subjective:   [name] seen and examined today.  History of dementia.  Patient complains of abdominal pain today, she states she was nauseous however denies vomiting.  Denies chest pain or shortness of breath.    Objective:   Vitals:   05/21/20 0900 05/21/20 0901 05/21/20 0911 05/21/20 1131  BP:  107/83 107/83   Pulse:  (!) 102 (!) 102   Resp: 19  19   Temp:   99 F (37.2 C)   TempSrc:   Oral   SpO2:  98% 94% 94%  Weight:      Height:        Intake/Output Summary (Last 24 hours) at 05/21/2020 1218 Last data filed at 05/21/2020 0700 Gross per 24 hour  Intake 2330.89 ml  Output 501 ml  Net 1829.89 ml   Filed Weights   05/19/20 1720 05/20/20 0348 05/21/20 0359  Weight: 47.6 kg 48.9 kg 50.7 kg   Exam  General: Well developed, chronically ill-appearing, NAD  HEENT: NCAT, mucous membranes moist.   Cardiovascular: S1 S2 auscultated, RRR  Respiratory: Diminished breath sounds L>R  Abdomen: Soft, nontender, nondistended, + bowel sounds  Extremities: warm dry without cyanosis clubbing or edema. LUE edema. Abrasion R ankle.   Neuro: AAOx2 (self, place), dementia, nonfocal  Psych: Appropriate mood and affect   Data Reviewed: I have personally reviewed following labs and imaging studies  CBC: Recent Labs  Lab 05/19/20 0009 05/19/20 0009 05/19/20 0503 05/19/20 0503 05/19/20 1840 05/19/20 2353 05/20/20 0708 05/20/20 2007 05/21/20 0636  WBC 10.4  --  9.7  --   --   --   --   --  10.1  NEUTROABS 8.6*  --   --   --   --   --   --   --   --   HGB 8.3*   <  > 7.2*   < > 7.4* 6.6* 7.2* 9.3* 9.5*  HCT 26.4*   < > 23.6*   < > 24.0* 20.6* 23.3* 29.2* 29.6*  MCV 101.9*  --  103.1*  --   --   --   --   --  97.4  PLT 302  --  260  --   --   --   --   --  344   < > = values in this interval not displayed.   Basic Metabolic Panel: Recent Labs  Lab 05/19/20 0009 05/19/20 0503 05/21/20 0636  NA 137 142 141  K 4.2 3.9  3.9  CL 98 105 108  CO2 31 30 26   GLUCOSE 138* 128* 105*  BUN 23 21 11   CREATININE 0.81 0.66 0.64  CALCIUM 8.1* 8.4* 8.1*   GFR: Estimated Creatinine Clearance: 49.4 mL/min (by C-G formula based on SCr of 0.64 mg/dL). Liver Function Tests: Recent Labs  Lab 05/19/20 0009  AST 18  ALT 10  ALKPHOS 72  BILITOT 0.6  PROT 6.3*  ALBUMIN 2.7*   No results for input(s): LIPASE, AMYLASE in the last 168 hours. No results for input(s): AMMONIA in the last 168 hours. Coagulation Profile: Recent Labs  Lab 05/19/20 0503  INR 1.7*   Cardiac Enzymes: No results for input(s): CKTOTAL, CKMB, CKMBINDEX, TROPONINI in the last 168 hours. BNP (last 3 results) No results for input(s): PROBNP in the last 8760 hours. HbA1C: No results for input(s): HGBA1C in the last 72 hours. CBG: No results for input(s): GLUCAP in the last 168 hours. Lipid Profile: No results for input(s): CHOL, HDL, LDLCALC, TRIG, CHOLHDL, LDLDIRECT in the last 72 hours. Thyroid Function Tests: No results for input(s): TSH, T4TOTAL, FREET4, T3FREE, THYROIDAB in the last 72 hours. Anemia Panel: Recent Labs    05/20/20 0708  VITAMINB12 1,403*  FOLATE 16.1  FERRITIN 163  TIBC 133*  IRON 32  RETICCTPCT 1.6   Urine analysis:    Component Value Date/Time   COLORURINE AMBER (A) 05/19/2020 0010   APPEARANCEUR TURBID (A) 05/19/2020 0010   APPEARANCEUR Clear 09/25/2015 1020   LABSPEC 1.016 05/19/2020 0010   LABSPEC 1.011 11/02/2014 1914   PHURINE 8.0 05/19/2020 0010   GLUCOSEU NEGATIVE 05/19/2020 0010   GLUCOSEU Negative 11/02/2014 1914   HGBUR NEGATIVE  05/19/2020 0010   HGBUR trace-intact 04/29/2010 0952   BILIRUBINUR NEGATIVE 05/19/2020 0010   BILIRUBINUR Negative 09/25/2015 1020   BILIRUBINUR Negative 11/02/2014 1914   KETONESUR 5 (A) 05/19/2020 0010   PROTEINUR 100 (A) 05/19/2020 0010   UROBILINOGEN negative 02/04/2014 1603   UROBILINOGEN 0.2 04/29/2010 0952   NITRITE NEGATIVE 05/19/2020 0010   LEUKOCYTESUR TRACE (A) 05/19/2020 0010   LEUKOCYTESUR Negative 11/02/2014 1914   Sepsis Labs: @LABRCNTIP (procalcitonin:4,lacticidven:4)  ) Recent Results (from the past 240 hour(s))  Blood culture (routine x 2)     Status: None (Preliminary result)   Collection Time: 05/19/20 12:10 AM   Specimen: Right Antecubital; Blood  Result Value Ref Range Status   Specimen Description RIGHT ANTECUBITAL  Final   Special Requests   Final    BOTTLES DRAWN AEROBIC AND ANAEROBIC Blood Culture results may not be optimal due to an excessive volume of blood received in culture bottles   Culture   Final    NO GROWTH 2 DAYS Performed at [facility], [address], [place], [place] [address]    Report Status PENDING  Incomplete  Respiratory Panel by RT PCR (Flu A&B, Covid) - Nasopharyngeal Swab     Status: None   Collection Time: 05/19/20 12:10 AM   Specimen: Nasopharyngeal Swab  Result Value Ref Range Status   SARS Coronavirus 2 by RT PCR NEGATIVE NEGATIVE Final    Comment: (NOTE) SARS-CoV-2 target nucleic acids are NOT DETECTED.  The SARS-CoV-2 RNA is generally detectable in upper respiratoy specimens during the acute phase of infection. The lowest concentration of SARS-CoV-2 viral copies this assay can detect is 131 copies/mL. A negative result does not preclude SARS-Cov-2 infection and should not be used as the sole basis for treatment or other patient management decisions. A negative result may occur with  improper specimen collection/handling, submission of specimen other than nasopharyngeal swab, presence of viral mutation(s)  within the areas targeted by this assay, and inadequate number of viral copies (<131 copies/mL). A negative result must be combined with clinical observations, patient history, and epidemiological information. The expected result is Negative.  Fact Sheet for Patients:  PinkCheek.be  Fact Sheet for Healthcare Providers:  GravelBags.it  This test is no t yet approved or cleared by the [place] FDA and  has been authorized for detection and/or diagnosis of SARS-CoV-2 by FDA under an Emergency Use Authorization (EUA). This EUA will remain  in effect (meaning this test can be used) for the duration of the COVID-19 declaration under Section 564(b)(1) of the Act, 21 U.S.C. section 360bbb-3(b)(1), unless the authorization is terminated or revoked sooner.     Influenza A by PCR NEGATIVE NEGATIVE Final   Influenza B by PCR NEGATIVE NEGATIVE Final    Comment: (NOTE) The Xpert Xpress SARS-CoV-2/FLU/RSV assay is intended as an aid in  the diagnosis of influenza from Nasopharyngeal swab specimens and  should not be used as a sole basis for treatment. Nasal washings and  aspirates are unacceptable for Xpert Xpress SARS-CoV-2/FLU/RSV  testing.  Fact Sheet for Patients: PinkCheek.be  Fact Sheet for Healthcare Providers: GravelBags.it  This test is not yet approved or cleared by the [place] FDA and  has been authorized for detection and/or diagnosis of SARS-CoV-2 by  FDA under an Emergency Use Authorization (EUA). This EUA will remain  in effect (meaning this test can be used) for the duration of the  Covid-19 declaration under Section 564(b)(1) of the Act, 21  U.S.C. section 360bbb-3(b)(1), unless the authorization is  terminated or revoked. Performed at [facility], [address] [place], [place] [address]   Urine culture     Status: Abnormal    Collection Time: 05/19/20 12:11 AM   Specimen: Urine, Clean Catch  Result Value Ref Range Status   Specimen Description   Final    URINE, CLEAN CATCH Performed at [facility], [address] [place], [place] [address]    Special Requests   Final    NONE Performed at [facility], [address] [place], [place] [address]    Culture MULTIPLE SPECIES PRESENT, SUGGEST RECOLLECTION (A)  Final   Report Status 05/20/2020 FINAL  Final  Blood culture (routine x 2)     Status: None (Preliminary result)   Collection Time: 05/19/20 12:27 AM   Specimen: BLOOD RIGHT HAND  Result Value Ref Range Status   Specimen Description BLOOD RIGHT HAND  Final   Special Requests   Final    BOTTLES DRAWN AEROBIC AND ANAEROBIC Blood Culture adequate volume   Culture   Final    NO GROWTH 2 DAYS Performed at [facility], [address], [place], [place] [address]    Report Status PENDING  Incomplete  MRSA PCR Screening     Status: None   Collection Time: 05/19/20  6:31 PM   Specimen: Nasopharyngeal  Result Value Ref Range Status   MRSA by PCR NEGATIVE NEGATIVE Final    Comment:        The GeneXpert MRSA Assay (FDA approved for NASAL specimens only), is one component of a comprehensive MRSA colonization surveillance program. It is not intended to diagnose MRSA infection nor to guide or monitor treatment for MRSA infections. Performed at [facility], [address], [place], [place] [address]  Radiology Studies: DG Chest 1 View  Addendum Date: 05/21/2020   ADDENDUM REPORT: 05/21/2020 06:03 ADDENDUM: Additional attempts were made to reposition the patient. With diminished patient rotation and less obscuration of the left upper lung. These images reveal increasing now completely confluent opacity in the left lung base with air bronchograms likely reflecting a combination of consolidated lung, volume loss and layering effusion. Increasing opacities are  present throughout the right mid to lower lung as well which could reflect further airspace disease or edema as well as small right effusion. This addendum will be called to the ordering clinician or representative by the Radiologist Assistant, and communication documented in the PACS or [employer]. Electronically Signed   By: Lovena Le M.D.   On: 05/21/2020 06:03   Result Date: 05/21/2020 CLINICAL DATA:  Shortness of breath EXAM: CHEST  1 VIEW COMPARISON:  Radiograph 05/19/2020, CT 06/14/2015 FINDINGS: Difficulty with patient positioning with the head projecting over the left mid to upper lung obscuring much of the visible portions of the left lung itself. Additionally there is a steep right anterior obliquity. Redemonstration of some patchy and streaky right basilar opacity. More globally increased opacity in the retrocardiac space. Obscuration of the hemidiaphragms is suggestive of developing pleural effusions. No visible right pneumothorax. Left apex obscured. Visible mediastinal contours are not significantly changed from prior though much of the left heart border is obscured. No acute osseous or soft tissue abnormality. Chronic rib deformities are again seen. Telemetry leads overlie the chest. IMPRESSION: 1. Persistent right basilar airspace opacities with developing right effusion. 2. Globally increased opacity in the retrocardiac space, could reflect developing pleural effusion, atelectasis or pneumonia. 3. Marked patient rotation as well patient's head obscuring the left mid to upper lung, limiting evaluation. Electronically Signed: By: Lovena Le M.D. On: 05/21/2020 05:13   CT CHEST W CONTRAST  Result Date: 05/21/2020 CLINICAL DATA:  Respiratory failure history of multiple sclerosis and DVT on Xarelto. EXAM: CT CHEST WITH CONTRAST TECHNIQUE: Multidetector CT imaging of the chest was performed during intravenous contrast administration. CONTRAST:  79mL OMNIPAQUE IOHEXOL 300 MG/ML  SOLN  COMPARISON:  06/13/2015 FINDINGS: Cardiovascular: Heart size is normal with small pericardial effusion. Central pulmonary vasculature is normal on venous phase assessment, study not protocol for PE evaluation. Aortic caliber is normal. Mediastinum/Nodes: No axillary adenopathy. Thoracic inlet structures are normal. Lungs/Pleura: Moderate bilateral pleural effusions. Basilar airspace disease bilaterally. Effusion greatest on the RIGHT. Airspace disease greatest on the LEFT with component of volume loss and shift of mediastinal structures from RIGHT to LEFT into the LEFT chest. Upper Abdomen: Incidental imaging of upper abdominal contents without acute process. Musculoskeletal: Chronic deformity of the sternum indicative of prior fracture. Evidence of spinal fusion at the upper portion of the lumbar spine, incompletely imaged on CT data. No acute bone finding. T11 compression with similar appearance. IMPRESSION: 1. Moderate bilateral pleural effusions with bibasilar airspace disease greatest on the LEFT with component of volume loss and shift of mediastinal structures from RIGHT to LEFT into the LEFT chest. Much of this appears to represent volume loss though there is some areas of variable enhancement that raise the question of concomitant pneumonia particularly at the LEFT lung base. Loss of air bronchograms raising the question of aspiration though there is no material seen in the trachea or larger airways. 2. Small pericardial effusion. 3. Chronic deformity of the sternum indicative of prior fracture. 4. T11 compression with similar appearance. 5. Aortic atherosclerosis. Aortic Atherosclerosis (ICD10-I70.0). Electronically Signed  By: Zetta Bills M.D.   On: 05/21/2020 11:06     Scheduled Meds: . baclofen  20 mg Oral TID  . diclofenac sodium  4 g Topical QID  . escitalopram  20 mg Oral Daily  . famotidine  20 mg Oral BID  . fluticasone  1 spray Each Nare Daily  . gabapentin  600 mg Oral QID  .  guaiFENesin  600 mg Oral BID  . hydroxyurea  500 mg Oral Q MTWThF  . ipratropium-albuterol  3 mL Nebulization QID  . loratadine  10 mg Oral Daily  . magnesium oxide  400 mg Oral BID AC & HS  . multivitamin with minerals  1 tablet Oral Daily  . oxyCODONE  5 mg Oral Q6H  . rOPINIRole  2 mg Oral QID  . sodium chloride flush  3 mL Intravenous Q12H  . tiZANidine  4 mg Oral TID  . traZODone  75 mg Oral QHS   Continuous Infusions: . ceFEPime (MAXIPIME) IV       LOS: 2 days   Time Spent in minutes   45 minutes  Silus Lanzo D.O. on 05/21/2020 at 12:18 PM  Between 7am to 7pm - Please see pager noted on [email or web address]  After 7pm go to [email or web address]  And look for the night coverage person covering for me after hours  [facility] Office  [phone]

## 2020-05-21 NOTE — Progress Notes (Signed)
Pt back from thoracentesis, 550 cc removed. On arrival to unit BP 86/64, MAP 73, HR 93, SPO2 98% on 4 L N/C. Asymptomatic, scheduled midodrine given. Per Dr. Ree Kida okay to hold IV lasix. No other new orders received. Will continue to assess.

## 2020-05-21 NOTE — Progress Notes (Signed)
GI Inpatient Follow-up Note  Subjective:  Patient seen in follow-up for anemia and heme positive stool. She was febrile, tachycardic, and hypoxic overnight which required non-rebreather placement. She is currently on nasal cannula O2. Chest CT was obtained today showing pneumonia and bilateral pleural effusions. Pulmonology consult pending. No overt gastrointestinal blood loss. Pt denies any abdominal pain, rectal pain, or abdominal bloating. Speech therapy saw patient and gave diet recommendations.   Scheduled Inpatient Medications:  . baclofen  20 mg Oral TID  . diclofenac sodium  4 g Topical QID  . escitalopram  20 mg Oral Daily  . famotidine  20 mg Oral BID  . fluticasone  1 spray Each Nare Daily  . gabapentin  600 mg Oral QID  . guaiFENesin  600 mg Oral BID  . hydroxyurea  500 mg Oral Q MTWThF  . ipratropium-albuterol  3 mL Nebulization QID  . loratadine  10 mg Oral Daily  . magnesium oxide  400 mg Oral BID AC & HS  . multivitamin with minerals  1 tablet Oral Daily  . oxyCODONE  5 mg Oral Q6H  . rOPINIRole  2 mg Oral QID  . sodium chloride flush  3 mL Intravenous Q12H  . tiZANidine  4 mg Oral TID  . traZODone  75 mg Oral QHS    Continuous Inpatient Infusions:   . ceFEPime (MAXIPIME) IV 2 g (05/21/20 1238)    PRN Inpatient Medications:  acetaminophen **OR** acetaminophen, chlorpheniramine-HYDROcodone, magnesium hydroxide, metoCLOPramide, ondansetron **OR** ondansetron (ZOFRAN) IV, oxyCODONE  Review of Systems: Constitutional: Weight is stable.  Eyes: No changes in vision. ENT: No oral lesions, sore throat.  GI: see HPI.  Heme/Lymph: No easy bruising.  CV: No chest pain.  GU: No hematuria.  Integumentary: No rashes.  Neuro: No headaches.  Psych: No depression/anxiety.  Endocrine: No heat/cold intolerance.  Allergic/Immunologic: No urticaria.  Resp: No cough, SOB.  Musculoskeletal: No joint swelling.    Physical Examination: BP 97/71   Pulse (!) 101   Temp 99.6  F (37.6 C)   Resp 16   Ht 5\' 5"  (1.651 m)   Wt 50.7 kg   SpO2 97%   BMI 18.60 kg/m  Chronically ill-appearing female in hospital bed. Friend present bedside.  Gen: NAD, alert and oriented to self and place HEENT: PEERLA, EOMI, Neck: supple, no JVD or thyromegaly Chest: CTA bilaterally, no wheezes, crackles, or other adventitious sounds CV: RRR, no m/g/c/r Abd: soft, NT, ND, +BS in all four quadrants; no HSM, guarding, ridigity, or rebound tenderness Ext: no edema, contracted Skin: no rash or lesions noted Lymph: no LAD Data: Lab Results  Component Value Date   WBC 10.1 05/21/2020   HGB 9.5 (L) 05/21/2020   HCT 29.6 (L) 05/21/2020   MCV 97.4 05/21/2020   PLT 344 05/21/2020   Recent Labs  Lab 05/20/20 0708 05/20/20 2007 05/21/20 0636  HGB 7.2* 9.3* 9.5*   Lab Results  Component Value Date   NA 141 05/21/2020   K 3.9 05/21/2020   CL 108 05/21/2020   CO2 26 05/21/2020   BUN 11 05/21/2020   CREATININE 0.64 05/21/2020   Lab Results  Component Value Date   ALT 10 05/19/2020   AST 18 05/19/2020   ALKPHOS 72 05/19/2020   BILITOT 0.6 05/19/2020   Recent Labs  Lab 05/19/20 0503 05/19/20 1840  APTT  --  60*  INR 1.7*  --    Assessment/Plan:  74 y/o Caucasian female with a PMH of PMH of multiple  sclerosis, Hx of DVT on Xarelto, depression, IBS, chronic anemia, and chronic diastolic CHF admitted for sepsis 2/2 UTI and CAP on IV antibiotics. GI consulted for anemia and heme positive stool  1. Anemia, macrocytic - iron studies within normal limits, normal retic count, normal folate and Vit B12. She has history of chronic anemia previously followed by [facility].   2. Heme positive stool - no overt signs of hematochezia or melena   3. Sepsis 2/2 UTI and CAP - Chest CT today with worsening pleural effusion and pneumonia. Pulmonology consulted.   4. Oropharyngeal dysphagia - Speech therapy following  5. Hx of recurrent DVT - Xarelto currently on  hold  6. DNR status  Recommendations:  1. No signs of overt gastrointestinal blood loss 2. With current sepsis 2/2 UTI and pneumonia complicated by bilateral pleural effusions and worsening pneumonia, no plans for endoscopic procedures.  3. Continue to monitor H&H and transfuse as needed for Hgb <7.0. 4. GI will sign off at this time.   Please call with questions or concerns. Thank you    Octavia Bruckner, PA-C [facility] [phone] (Cell)

## 2020-05-21 NOTE — Progress Notes (Signed)
Initial Nutrition Assessment  DOCUMENTATION CODES:   Not applicable  INTERVENTION:   Ensure Enlive po BID, each supplement provides 350 kcal and 20 grams of protein  Magic cup TID with meals, each supplement provides 290 kcal and 9 grams of protein  MVI daily   NUTRITION DIAGNOSIS:   Inadequate oral intake related to acute illness as evidenced by meal completion < 50%.  GOAL:   Patient will meet greater than or equal to 90% of their needs  MONITOR:   PO intake, Supplement acceptance, Labs, Weight trends, Skin, I & O's  REASON FOR ASSESSMENT:   Other (Comment) (Low BMI)    ASSESSMENT:   74 y.o. female with a known history of multiple sclerosis, DVT on Xarelto, depression, coronary artery disease, IBS, chronic anemia, mild dementia and chronic diastolic CHF who is admitted with UTI and PNA  RD working remotely.  Unable to reach py by phone. Per chart review, pt with possible dementia. Pt with poor appetite and oral intake in hospital; pt eating <50% of meals. Pt seen by SLP and placed on a dysphagia 1/thin liquid diet. RD will add supplements to help pt meet her estimated needs. Per chart, pt appears fairly weight stable at baseline.   Medications reviewed and include: pepcid, Mg oxide, MVI, oxycodone, cefepime  Labs reviewed: Iron 32, TIBC 133(L), ferritin 163, folate 16.1, B12 1403(H) Hgb 9.5(L), Hct 29.6(L)  NUTRITION - FOCUSED PHYSICAL EXAM: Unable to perform at this time   Diet Order:   Diet Order            DIET - DYS 1 Room service appropriate? Yes; Fluid consistency: Thin  Diet effective now                EDUCATION NEEDS:   No education needs have been identified at this time  Skin:  Skin Assessment: Reviewed RN Assessment (ecchymosis, MASD)  Last BM:  6/23- type 5  Height:   Ht Readings from Last 1 Encounters:  05/19/20 5\' 5"  (1.651 m)    Weight:   Wt Readings from Last 1 Encounters:  05/21/20 50.7 kg    Ideal Body Weight:  56.8  kg  BMI:  Body mass index is 18.6 kg/m.  Estimated Nutritional Needs:   Kcal:  1400-1600kcal/day  Protein:  70-80g/day  Fluid:  >1.3L/day  Koleen Distance MS, RD, LDN Please refer to [facility] for RD and/or RD on-call/weekend/after hours pager

## 2020-05-21 NOTE — Consult Note (Signed)
Pulmonary Medicine          Date: 05/21/2020,   MRN# [id number] Cynthia Price 1946-03-15     AdmissionWeight: 56.7 kg                 CurrentWeight: 50.7 kg   Referring physician: Dr Ree Kida   CHIEF COMPLAINT:   Loculated pleural effusion concerning for empyema   HISTORY OF PRESENT ILLNESS   This is a 74 year old female with dementia, multiple sclerosis, she has a history of DVT and is on chronic anticoagulation with the Xarelto as well as chronic depression, CAD, IBS, anemia of chronic disease, diastolic CHF and is coming to the hospital from a nursing home with febrile illness and feeling overall unwell.  She was found to be profoundly hypotensive and received IV fluid resuscitation with transient improvement followed by continued decline.  Patient is CODE STATUS DNR.  while on the medical floor she had been started on antibiotics as well as continued on IV fluids as well as albumin.  Hello hates it cholesterol ICU Upon arrival EMS her blood pressure was 60/44 for which she was given IV fluid bolus and blood pressure was up to the 80s in the ER.  No history could be obtained from the patient due to her altered mental status and likely underlying dementia.  Upon presentation to the emergency room, blood pressure was 81/55 with a MAP of 64 with a temperature of 100.2 rectally and later blood pressure was 76/46 with a MAP of 56 with pulse oximetry 99% on 2 L of O2 by nasal cannula.  Labs revealed, CMP was remarkable for 2X.3 and albumin 2.7.  Lactic acid was 1 and procalcitonin was 0.15.  CBC showed anemia with hemoglobin 8.3 and hematocrit 36.4 compared to 9.8 and 32.5 on 02/02/2019.  UA was positive for UTI.  COVID-19 PCR came back negative.  Chest x-ray showed streaky retrocardiac opacity and patchy right lung base opacity that may be atelectasis or pneumonia with possible small pleural effusions.  The patient was given  IV cefepime and vancomycin and 2 L bolus of IV lactated  Ringer.  Blood pressure initially improved to 110/62 and later dropped again to 75/49 with a MAP of 58.  After consultation with ICU physician by the ER physician, the ICU physician recommended 500 mL of 5% IV albumin that was given and blood pressure improved temporarily to 97/64 with additional 1 L bolus of IV lactated Ringer as well as IV albumin with a MAP of 76 then 110/67 with a MAP of 80 and then within an hour dropped again to 85/54 with a MAP of 65 however with the patient was apparently asleep and during my interview her blood pressure again came up to 125/80 with a MAP of 94. Patient continues to have worsening respiratory status and with CT chest showing bilateral pleural effusions with bilateral atelectasis and findings compatible with left base consolidation suggestive of pneumonia. Pulmonary consultation placed for further evaluation and management.     PAST MEDICAL HISTORY   Past Medical History:  Diagnosis Date  . Anemia 2012   from labs at [facility]  . Back pain    s/p hemilaminectomy with h/o herniated disc at L4L5  . Blood clot in vein    behind left knee  . BP (high blood pressure) 03/08/2014   Last Assessment & Plan:  Blood pressure has been controlled without significant dizzyness or associated fatigue. Taking antihypertensives as directed without difficulty.    Marland Kitchen  CAD (coronary artery disease) 10/18/2011  . Chronic diastolic heart failure ([place]) 09/07/2014   Last Assessment & Plan:  Edema and sob seemingly baseline   . Collagen vascular disease ([place])   . Depression    after husband died  . DVT (deep venous thrombosis) ([place]) 05/14/2012  . Essential thrombocytosis ([place])   . Hyperlipidemia   . 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[place]) 03/05/2015  . Hypotension 03/26/2013  . IBS (irritable bowel syndrome)   . Insomnia   . Lupus anticoagulant positive   . Macular degeneration   . MS (multiple sclerosis) ([place])   . Multiple sclerosis ([place]) 04/29/2008   Per neurology at [facility]     . NEUROPATHY 04/29/2008   Per [facility]rology at [facility]    . RLS (restless legs syndrome)   . Spinal stenosis    lumbar  . Urinary retention    with high post void residuals 2013, per [facility] uro     SURGICAL HISTORY   Past Surgical History:  Procedure Laterality Date  . ANTERIOR LUMBAR FUSION  2012  . APPENDECTOMY    . CHOLECYSTECTOMY    . CHOLECYSTECTOMY    . LUMBAR DISC SURGERY    . LUMBAR LAMINECTOMY    . TONSILLECTOMY       FAMILY HISTORY   Family History  Problem Relation Age of Onset  . Heart disease Mother   . Thyroid cancer Mother   . Ovarian cancer Mother   . Alcohol abuse Father   . Heart disease Father   . Diabetes Sister   . Diabetes Brother   . Multiple sclerosis Paternal Aunt   . Multiple sclerosis Cousin      SOCIAL HISTORY   Social History   Tobacco Use  . Smoking status: Never Smoker  . Smokeless tobacco: Never Used  Substance Use Topics  . Alcohol use: No  . Drug use: No     MEDICATIONS    Home Medication:    Current Medication:  Current Facility-Administered Medications:  .  acetaminophen (TYLENOL) tablet 650 mg, 650 mg, Oral, Q6H PRN, 650 mg at 05/21/20 0424 **OR** acetaminophen (TYLENOL) suppository 650 mg, 650 mg, Rectal, Q6H PRN, Mansy, Jan A, MD .  baclofen (LIORESAL) tablet 20 mg, 20 mg, Oral, TID, Mansy, Jan A, MD, 20 mg at 05/21/20 0914 .  cefTRIAXone (ROCEPHIN) 2 g in sodium chloride 0.9 % 100 mL IVPB, 2 g, Intravenous, Q24H, Oswald Hillock, RPH, Stopped at 05/21/20 0541 .  chlorpheniramine-HYDROcodone (TUSSIONEX) 10-8 MG/5ML suspension 5 mL, 5 mL, Oral, Q12H PRN, Mansy, Jan A, MD .  diclofenac sodium (VOLTAREN) 1 % transdermal gel 4 g, 4 g, Topical, QID, Mansy, Jan A, MD, 4 g at 05/21/20 0916 .  escitalopram (LEXAPRO) tablet 20 mg, 20 mg, Oral, Daily, Mansy, Jan A, MD, 20 mg at 05/21/20 0914 .  famotidine (PEPCID) tablet 20 mg, 20 mg, Oral, BID, Mansy, Jan A, MD, 20 mg at 05/21/20 0913 .  fluticasone (FLONASE) 50 MCG/ACT nasal  spray 1 spray, 1 spray, Each Nare, Daily, Mansy, Jan A, MD, 1 spray at 05/21/20 0916 .  gabapentin (NEURONTIN) tablet 600 mg, 600 mg, Oral, QID, Mansy, Jan A, MD, 600 mg at 05/21/20 0913 .  guaiFENesin (MUCINEX) 12 hr tablet 600 mg, 600 mg, Oral, BID, Mansy, Jan A, MD, 600 mg at 05/21/20 0913 .  hydroxyurea (HYDREA) capsule 500 mg, 500 mg, Oral, Q MTWThF, Mansy, Jan A, MD, 500 mg at 05/21/20 0429 .  ipratropium-albuterol (DUONEB) 0.5-2.5 (3) MG/3ML nebulizer solution  3 mL, 3 mL, Nebulization, QID, Mansy, Jan A, MD, 3 mL at 05/21/20 0746 .  loratadine (CLARITIN) tablet 10 mg, 10 mg, Oral, Daily, Mansy, Jan A, MD, 10 mg at 05/21/20 0912 .  magnesium hydroxide (MILK OF MAGNESIA) suspension 30 mL, 30 mL, Oral, Daily PRN, Mansy, Jan A, MD .  magnesium oxide (MAG-OX) tablet 400 mg, 400 mg, Oral, BID AC & HS, Mansy, Jan A, MD, 400 mg at 05/21/20 0931 .  metoCLOPramide (REGLAN) tablet 5 mg, 5 mg, Oral, Q6H PRN, Mansy, Jan A, MD, 5 mg at 05/21/20 7371 .  multivitamin with minerals tablet 1 tablet, 1 tablet, Oral, Daily, Mansy, Jan A, MD, 1 tablet at 05/21/20 0913 .  ondansetron (ZOFRAN) tablet 4 mg, 4 mg, Oral, Q6H PRN **OR** ondansetron (ZOFRAN) injection 4 mg, 4 mg, Intravenous, Q6H PRN, Mansy, Jan A, MD, 4 mg at 05/20/20 2020 .  oxyCODONE (Oxy IR/ROXICODONE) immediate release tablet 5 mg, 5 mg, Oral, Q6H PRN, Mansy, Jan A, MD, 5 mg at 05/20/20 0824 .  oxyCODONE (Oxy IR/ROXICODONE) immediate release tablet 5 mg, 5 mg, Oral, Q6H, Mansy, Jan A, MD, 5 mg at 05/21/20 0424 .  rOPINIRole (REQUIP) tablet 2 mg, 2 mg, Oral, QID, Mansy, Jan A, MD, 2 mg at 05/21/20 0913 .  sodium chloride flush (NS) 0.9 % injection 3 mL, 3 mL, Intravenous, Q12H, Mikhail, [place], DO, 3 mL at 05/21/20 0915 .  tiZANidine (ZANAFLEX) tablet 4 mg, 4 mg, Oral, TID, Mansy, Jan A, MD, 4 mg at 05/21/20 0917 .  traZODone (DESYREL) tablet 75 mg, 75 mg, Oral, QHS, Mansy, Jan A, MD, 75 mg at 05/20/20 2021    ALLERGIES    Atorvastatin     REVIEW OF SYSTEMS    Review of Systems:  Gen:  Denies  fever, sweats, chills weigh loss  HEENT: Denies blurred vision, double vision, ear pain, eye pain, hearing loss, nose bleeds, sore throat Cardiac:  No dizziness, chest pain or heaviness, chest tightness,edema Resp:   Denies cough or sputum porduction, shortness of breath,wheezing, hemoptysis,  Gi: Denies swallowing difficulty, stomach pain, nausea or vomiting, diarrhea, constipation, bowel incontinence Gu:  Denies bladder incontinence, burning urine Ext:   Denies Joint pain, stiffness or swelling Skin: Denies  skin rash, easy bruising or bleeding or hives Endoc:  Denies polyuria, polydipsia , polyphagia or weight change Psych:   Denies depression, insomnia or hallucinations   Other:  All other systems negative   VS: BP 107/83   Pulse (!) 102   Temp 99 F (37.2 C)   Resp 19   Ht 5\' 5"  (1.651 m)   Wt 50.7 kg   SpO2 94%   BMI 18.60 kg/m      PHYSICAL EXAM    GENERAL:NAD, no fevers, chills, no weakness no fatigue HEAD: Normocephalic, atraumatic.  EYES: Pupils equal, round, reactive to light. Extraocular muscles intact. No scleral icterus.  MOUTH: Moist mucosal membrane. Dentition intact. No abscess noted.  EAR, NOSE, THROAT: Clear without exudates. No external lesions.  NECK: Supple. No thyromegaly. No nodules. No JVD.  PULMONARY: Decreased breath sounds bilaterally CARDIOVASCULAR: S1 and S2. Regular rate and rhythm. No murmurs, rubs, or gallops. No edema. Pedal pulses 2+ bilaterally.  GASTROINTESTINAL: Soft, nontender, nondistended. No masses. Positive bowel sounds. No hepatosplenomegaly.  MUSCULOSKELETAL: No swelling, clubbing, or edema. Range of motion full in all extremities.  NEUROLOGIC: Cranial nerves II through XII are intact. No gross focal neurological deficits. Sensation intact. Reflexes intact.  SKIN: No ulceration, lesions, rashes,  or cyanosis. Skin warm and dry. Turgor intact.   PSYCHIATRIC: Mood, affect within normal limits. The patient is awake, alert and oriented x 3. Insight, judgment intact.       IMAGING    DG Chest 1 View  Addendum Date: 05/21/2020   ADDENDUM REPORT: 05/21/2020 06:03 ADDENDUM: Additional attempts were made to reposition the patient. With diminished patient rotation and less obscuration of the left upper lung. These images reveal increasing now completely confluent opacity in the left lung base with air bronchograms likely reflecting a combination of consolidated lung, volume loss and layering effusion. Increasing opacities are present throughout the right mid to lower lung as well which could reflect further airspace disease or edema as well as small right effusion. This addendum will be called to the ordering clinician or representative by the Radiologist Assistant, and communication documented in the PACS or [employer]. Electronically Signed   By: Lovena Le M.D.   On: 05/21/2020 06:03   Result Date: 05/21/2020 CLINICAL DATA:  Shortness of breath EXAM: CHEST  1 VIEW COMPARISON:  Radiograph 05/19/2020, CT 06/14/2015 FINDINGS: Difficulty with patient positioning with the head projecting over the left mid to upper lung obscuring much of the visible portions of the left lung itself. Additionally there is a steep right anterior obliquity. Redemonstration of some patchy and streaky right basilar opacity. More globally increased opacity in the retrocardiac space. Obscuration of the hemidiaphragms is suggestive of developing pleural effusions. No visible right pneumothorax. Left apex obscured. Visible mediastinal contours are not significantly changed from prior though much of the left heart border is obscured. No acute osseous or soft tissue abnormality. Chronic rib deformities are again seen. Telemetry leads overlie the chest. IMPRESSION: 1. Persistent right basilar airspace opacities with developing right effusion. 2. Globally increased opacity in  the retrocardiac space, could reflect developing pleural effusion, atelectasis or pneumonia. 3. Marked patient rotation as well patient's head obscuring the left mid to upper lung, limiting evaluation. Electronically Signed: By: Lovena Le M.D. On: 05/21/2020 05:13   DG Chest Portable 1 View  Result Date: 05/19/2020 CLINICAL DATA:  Sepsis.  Hypotension.  Fever. EXAM: PORTABLE CHEST 1 VIEW COMPARISON:  08/19/2017 FINDINGS: Patient's chin obscures the apices. Significant patient rotation. Stable heart size and mediastinal contours. Streaky retrocardiac opacity. Patchy right lung base opacity. Possible small pleural effusions. No pneumothorax. No pulmonary edema. Bones under mineralized with scoliotic curvature of the spine. IMPRESSION: 1. Streaky retrocardiac opacity and patchy right lung base opacity, may be atelectasis or pneumonia. Possible small pleural effusions. 2. Rotated exam. Electronically Signed   By: Keith Rake M.D.   On: 05/19/2020 01:01         ASSESSMENT/PLAN   Acute hypoxemic respiratory failure likley due to Community acquired pneumonia with bilateral compressive atelectasis  -present on admission   - patient with febrile illness and left lung infiltrate   -procalcitonin with only mild elevation -will recollect - will obtain thoracetnesis to evaluate for parapneumonic effusion  - Agree with empiric Cefepime / Zithromax   Bilateral pleural effusions - Possibly due to parapneumonic process vs malnutrtion related as patient does have severely depleted albumin vs CHF related -will attempt diuresis gently post improved BP -Thoracentesis with fluid studies to elucidate etiology  -may need chest tube    BiIateral compressive atelectasis -will drain fluid and diurese -recruitment manuevers with Metaneb therapy utilising saline  -albuterol nebulizer -patient unable to use IS/Flutter due to MS hx   Moderate protein calorie malnutrition   -  hindering diuresis and may  be cause of effusion   - will replete Albumin and consult RD nutritional consult    Circulatory shock -present on admission - likely related to sepsis secondary to UTI vs CAP - resolved with now transient hypotension  -partially fluid responsive  -will increase BP with midodrine for now to allow better diuresis while treating with IV antibiotics for CAP      Thank you for allowing me to participate in the care of this patient.   Patient/Family are satisfied with care plan and all questions have been answered.   This document was prepared using Dragon voice recognition software and may include unintentional dictation errors.     Ottie Glazier, M.D.  Division of [place]

## 2020-05-21 NOTE — Progress Notes (Signed)
Orders for metaneb. Patient unable to perform maneuver for metaneb therapy, or to be positioned to get an appropriate mask seal due to advanced Multiple Sclerosis. Patient is currently on 2 lpm nasal cannula. No apparent distress. SpO2 is 91-92%. Breath sounds diminished with fine crackles. SVN administered.

## 2020-05-21 NOTE — Evaluation (Addendum)
Clinical/Bedside Swallow Evaluation Patient Details  Name: Cynthia Price MRN: [id number] Date of Birth: 11-Jun-1946  Today's Date: 05/21/2020 Time: SLP Start Time (ACUTE ONLY): 0900 SLP Stop Time (ACUTE ONLY): 0930 SLP Time Calculation (min) (ACUTE ONLY): 30 min  Past Medical History:  Past Medical History:  Diagnosis Date  . Anemia 2012   from labs at [facility]  . Back pain    s/p hemilaminectomy with h/o herniated disc at L4L5  . Blood clot in vein    behind left knee  . BP (high blood pressure) 03/08/2014   Last Assessment & Plan:  Blood pressure has been controlled without significant dizzyness or associated fatigue. Taking antihypertensives as directed without difficulty.    Marland Kitchen CAD (coronary artery disease) 10/18/2011  . Chronic diastolic heart failure ([place]) 09/07/2014   Last Assessment & Plan:  Edema and sob seemingly baseline   . Collagen vascular disease ([place])   . Depression    after husband died  . DVT (deep venous thrombosis) ([place]) 05/14/2012  . Essential thrombocytosis ([place])   . Hyperlipidemia   . 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[place]) 03/05/2015  . Hypotension 03/26/2013  . IBS (irritable bowel syndrome)   . Insomnia   . Lupus anticoagulant positive   . Macular degeneration   . MS (multiple sclerosis) ([place])   . Multiple sclerosis ([place]) 04/29/2008   Per neurology at [facility]    . NEUROPATHY 04/29/2008   Per Neurology at [facility]    . RLS (restless legs syndrome)   . Spinal stenosis    lumbar  . Urinary retention    with high post void residuals 2013, per [facility]   Past Surgical History:  Past Surgical History:  Procedure Laterality Date  . ANTERIOR LUMBAR FUSION  2012  . APPENDECTOMY    . CHOLECYSTECTOMY    . CHOLECYSTECTOMY    . LUMBAR DISC SURGERY    . LUMBAR LAMINECTOMY    . TONSILLECTOMY     HPI:  Cynthia Price  is a 74 y.o. female with a known history of multiple sclerosis, DVT on Xarelto, depression, coronary artery disease, IBS, chronic anemia and chronic  diastolic CHF who comes from her skilled nursing facility with acute onset of fever for the last couple of days for which she was given Tylenol and was noted to have hypotension (BP 76/46). UA positive for UTI, Chest x-ray showed streaky retrocardiac opacity and patchy right lung base opacity that may be atelectasis or pneumonia with possible small pleural effusions.   Assessment / Plan / Recommendation Clinical Impression  Pt is at very high risk of aspiration given her neuromusculature posture with head fixed in downward position, inability to visualize her oral cavity as well as her inability to feed herself. Overnight pt had a respiratory decline, was placed on a non-rebreather and has since transitioned to [place]. Pt's oral phase appeared functional with puree and small bite of graham cracker and her pharyngeal phase appeared swift with no change in respiratory status. While pt was free of overt s/s of obvious dysphagia or aspiration when consuming very small trial of graham cracker, recommend very conservative diet of dysphagia 1 with thin liquids via straw, medicine crushed in puree.  Pt's nurse in room and voiced agreement with plan. ST to follow for diet check and possibility of advancing pt's diet.   Throughout trials, pt didn't report any globus sensation. More concerning is pt's slow rate of consumption and general dis-interest in consuming PO intake.   SLP Visit Diagnosis:  Dysphagia, unspecified (R13.10)    Aspiration Risk  Moderate aspiration risk;Risk for inadequate nutrition/hydration    Diet Recommendation   Dysphagia 1 diet with thin liquids via straw  Medication Administration: Crushed with puree    Other  Recommendations Oral Care Recommendations: Oral care BID   Follow up Recommendations Skilled Nursing facility      Frequency and Duration min 2x/week  2 weeks       Prognosis Prognosis for Safe Diet Advancement: Fair Barriers to Reach Goals: Severity of deficits       Swallow Study   General Date of Onset: 05/19/20 HPI: Cynthia Price  is a 74 y.o. female with a known history of multiple sclerosis, DVT on Xarelto, depression, coronary artery disease, IBS, chronic anemia and chronic diastolic CHF who comes from her skilled nursing facility with acute onset of fever for the last couple of days for which she was given Tylenol and was noted to have hypotension (BP 76/46). UA positive for UTI, Chest x-ray showed streaky retrocardiac opacity and patchy right lung base opacity that may be atelectasis or pneumonia with possible small pleural effusions. Type of Study: Bedside Swallow Evaluation Previous Swallow Assessment: none in chart Diet Prior to this Study: Dysphagia 3 (soft);Thin liquids Temperature Spikes Noted: No Respiratory Status: Nasal cannula History of Recent Intubation: No Behavior/Cognition: Alert;Pleasant mood Oral Cavity Assessment:  (unable to visualize d/t posture) Oral Care Completed by SLP: Recent completion by staff Self-Feeding Abilities: Total assist Patient Positioning: Upright in bed Baseline Vocal Quality: Low vocal intensity Volitional Cough: Cognitively unable to elicit Volitional Swallow: Unable to elicit    Oral/Motor/Sensory Function Overall Oral Motor/Sensory Function: Within functional limits   Ice Chips Ice chips: Not tested   Thin Liquid Thin Liquid: Within functional limits Presentation: Straw    Nectar Thick Nectar Thick Liquid: Not tested   Honey Thick Honey Thick Liquid: Not tested   Puree Puree: Within functional limits Presentation: Spoon   Solid     Solid:  (minimal bite of graham cracker)     Cynthia Price B. Rutherford Nail M.S., CCC-SLP, [place] Pathologist Rehabilitation Services Office [phone]  Jacques Willingham 05/21/2020,9:48 AM

## 2020-05-21 NOTE — Procedures (Signed)
Interventional Radiology Procedure Note  Procedure: US guided right thoracentesis  Complications: None  Estimated Blood Loss: None  Findings: 564m L of clear, yellow fluid removed from right pleural space. Post CXR pending.  Venetia Night. Kathlene Cote, M.D Pager:  [phone]

## 2020-05-21 NOTE — Progress Notes (Signed)
   05/21/20 0359  Vitals  Temp (!) 100.6 F (38.1 C)  BP (!) 135/97  MAP (mmHg) 108  BP Location Right Arm  BP Method Automatic  Patient Position (if appropriate) Sitting  Pulse Rate (!) 112  Pulse Rate Source Monitor  Resp (!) 24  Oxygen Therapy  SpO2 (!) 88 %  O2 Device Non-rebreather Mask  Height and Weight  Weight 50.7 kg  Type of Scale Used Bed  Type of Weight Actual  BMI (Calculated) 18.6  MEWS Score  MEWS Temp 1  MEWS Systolic 0  MEWS Pulse 2  MEWS RR 1  MEWS LOC 0  MEWS Score 4  MEWS Score Color Red  Provider Notification  Provider Name/Title O[place] NP  Date Provider Notified 05/21/20  Time Provider Notified 0402  Notification Type Call  Notification Reason Change in status (tachycardia, hypoxia)  Response See new orders  Date of Provider Response 05/21/20  Time of Provider Response [phone]

## 2020-05-21 NOTE — Progress Notes (Signed)
   05/21/20 2301  Vitals  Temp 99.7 F (37.6 C)  Temp Source Oral  BP (!) 82/58  MAP (mmHg) 67  BP Method Automatic  Pulse Rate 86  Pulse Rate Source Monitor  Oxygen Therapy  SpO2 94 %  MEWS Score  MEWS Temp 0  MEWS Systolic 1  MEWS Pulse 0  MEWS RR 1  MEWS LOC 0  MEWS Score 2  MEWS Score Color Yellow  Provider Notification  Provider Name/Title Rufina Falco, NP  Date Provider Notified 05/21/20  Time Provider Notified 2310  Notification Type Page (text)  Notification Reason Other (Comment)  Response No new orders

## 2020-05-22 DIAGNOSIS — L8990 Pressure ulcer of unspecified site, unspecified stage: Secondary | ICD-10-CM | POA: Diagnosis present

## 2020-05-22 LAB — TYPE AND SCREEN
ABO/RH(D): A POS
Antibody Screen: POSITIVE
Donor AG Type: NEGATIVE
PT AG Type: NEGATIVE
Unit division: 0
Unit division: 0
Unit division: 0

## 2020-05-22 LAB — CBC
HCT: 29.4 % — ABNORMAL LOW (ref 36.0–46.0)
Hemoglobin: 9.7 g/dL — ABNORMAL LOW (ref 12.0–15.0)
MCH: 31.8 pg (ref 26.0–34.0)
MCHC: 33 g/dL (ref 30.0–36.0)
MCV: 96.4 fL (ref 80.0–100.0)
Platelets: 340 10*3/uL (ref 150–400)
RBC: 3.05 MIL/uL — ABNORMAL LOW (ref 3.87–5.11)
RDW: 16.6 % — ABNORMAL HIGH (ref 11.5–15.5)
WBC: 10.2 10*3/uL (ref 4.0–10.5)
nRBC: 0 % (ref 0.0–0.2)

## 2020-05-22 LAB — BPAM RBC
Blood Product Expiration Date: 202107102359
Blood Product Expiration Date: 202107102359
Blood Product Expiration Date: 202107142359
ISSUE DATE / TIME: 202106211206
ISSUE DATE / TIME: 202106231505
Unit Type and Rh: 6200
Unit Type and Rh: 6200
Unit Type and Rh: 6200

## 2020-05-22 LAB — BASIC METABOLIC PANEL
Anion gap: 9 (ref 5–15)
BUN: 15 mg/dL (ref 8–23)
CO2: 24 mmol/L (ref 22–32)
Calcium: 8.4 mg/dL — ABNORMAL LOW (ref 8.9–10.3)
Chloride: 110 mmol/L (ref 98–111)
Creatinine, Ser: 0.65 mg/dL (ref 0.44–1.00)
GFR calc Af Amer: >60 mL/min (ref >60)
GFR calc non Af Amer: >60 mL/min (ref >60)
Glucose, Bld: 84 mg/dL (ref 70–99)
Potassium: 4 mmol/L (ref 3.5–5.1)
Sodium: 143 mmol/L (ref 135–145)

## 2020-05-22 LAB — PROCALCITONIN: Procalcitonin: 0.9 ng/mL

## 2020-05-22 MED ORDER — ALBUMIN HUMAN 25 % IV SOLN
12.5000 g | Freq: Every day | INTRAVENOUS | Status: DC
  Administered 2020-05-22 – 2020-05-29 (×8): 12.5 g via INTRAVENOUS
  Filled 2020-05-22 (×8): qty 50

## 2020-05-22 MED ORDER — IPRATROPIUM-ALBUTEROL 0.5-2.5 (3) MG/3ML IN SOLN
3.0000 mL | Freq: Two times a day (BID) | RESPIRATORY_TRACT | Status: DC
  Administered 2020-05-22 – 2020-05-27 (×10): 3 mL via RESPIRATORY_TRACT
  Filled 2020-05-22 (×10): qty 3

## 2020-05-22 MED ORDER — SALINE SPRAY 0.65 % NA SOLN
1.0000 | NASAL | Status: DC | PRN
  Administered 2020-05-22 – 2020-05-23 (×2): 1 via NASAL
  Filled 2020-05-22: qty 44

## 2020-05-22 NOTE — Care Management Important Message (Signed)
Important Message  Patient Details  Name: Cynthia Price MRN: [id number] Date of Birth: 04/12/1946   Medicare Important Message Given:  Yes     Dannette Barbara 05/22/2020, 12:58 PM

## 2020-05-22 NOTE — Progress Notes (Signed)
Pulmonary Medicine          Date: 05/22/2020,   MRN# [id number] Cynthia Price 1946-07-09     AdmissionWeight: 56.7 kg                 CurrentWeight: 51.8 kg   Referring physician: Dr Ree Kida   CHIEF COMPLAINT:   Loculated pleural effusion concerning for empyema   SUBJECTIVE   Patient resting in bed comfortably.  Pleural fluid pattern with lymphocyte predominant transudate, normal glucose level and negative culture to date without organisms on gram stain indicative of less likely parapneumonic effusion and more likely simple effusion from chronic disease incluiding cardiac vs systemic such as her hx of MS.    PAST MEDICAL HISTORY   Past Medical History:  Diagnosis Date  . Anemia 2012   from labs at [facility]  . Back pain    s/p hemilaminectomy with h/o herniated disc at L4L5  . Blood clot in vein    behind left knee  . BP (high blood pressure) 03/08/2014   Last Assessment & Plan:  Blood pressure has been controlled without significant dizzyness or associated fatigue. Taking antihypertensives as directed without difficulty.    Marland Kitchen CAD (coronary artery disease) 10/18/2011  . Chronic diastolic heart failure ([place]) 09/07/2014   Last Assessment & Plan:  Edema and sob seemingly baseline   . Collagen vascular disease ([place])   . Depression    after husband died  . DVT (deep venous thrombosis) ([place]) 05/14/2012  . Essential thrombocytosis ([place])   . Hyperlipidemia   . 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[place]) 03/05/2015  . Hypotension 03/26/2013  . IBS (irritable bowel syndrome)   . Insomnia   . Lupus anticoagulant positive   . Macular degeneration   . MS (multiple sclerosis) ([place])   . Multiple sclerosis ([place]) 04/29/2008   Per neurology at [facility]    . NEUROPATHY 04/29/2008   Per Neurology at [facility]    . RLS (restless legs syndrome)   . Spinal stenosis    lumbar  . Urinary retention    with high post void residuals 2013, per [facility] uro     SURGICAL HISTORY   Past  Surgical History:  Procedure Laterality Date  . ANTERIOR LUMBAR FUSION  2012  . APPENDECTOMY    . CHOLECYSTECTOMY    . CHOLECYSTECTOMY    . LUMBAR DISC SURGERY    . LUMBAR LAMINECTOMY    . TONSILLECTOMY       FAMILY HISTORY   Family History  Problem Relation Age of Onset  . Heart disease Mother   . Thyroid cancer Mother   . Ovarian cancer Mother   . Alcohol abuse Father   . Heart disease Father   . Diabetes Sister   . Diabetes Brother   . Multiple sclerosis Paternal Aunt   . Multiple sclerosis Cousin      SOCIAL HISTORY   Social History   Tobacco Use  . Smoking status: Never Smoker  . Smokeless tobacco: Never Used  Substance Use Topics  . Alcohol use: No  . Drug use: No     MEDICATIONS    Home Medication:    Current Medication:  Current Facility-Administered Medications:  .  0.9 %  sodium chloride infusion, , Intravenous, PRN, Cristal Ford, DO, Paused at 05/21/20 2258 .  acetaminophen (TYLENOL) tablet 650 mg, 650 mg, Oral, Q6H PRN, 650 mg at 05/21/20 1447 **OR** acetaminophen (TYLENOL) suppository 650 mg, 650 mg, Rectal, Q6H PRN, Eleonore Chiquito  S, RPH .  albumin human 25 % solution 12.5 g, 12.5 g, Intravenous, Daily, Jovon Winterhalter, MD .  baclofen (LIORESAL) tablet 20 mg, 20 mg, Oral, TID, Mansy, Jan A, MD, 20 mg at 05/22/20 0840 .  ceFEPIme (MAXIPIME) 2 g in sodium chloride 0.9 % 100 mL IVPB, 2 g, Intravenous, Q12H, Cristal Ford, DO, Stopped at 05/22/20 [phone]  chlorpheniramine-HYDROcodone (TUSSIONEX) 10-8 MG/5ML suspension 5 mL, 5 mL, Oral, Q12H PRN, Mansy, Jan A, MD .  diclofenac sodium (VOLTAREN) 1 % transdermal gel 4 g, 4 g, Topical, QID, Mansy, Jan A, MD, 4 g at 05/22/20 0841 .  escitalopram (LEXAPRO) tablet 20 mg, 20 mg, Oral, Daily, Mansy, Jan A, MD, 20 mg at 05/22/20 0840 .  famotidine (PEPCID) tablet 20 mg, 20 mg, Oral, BID, Mansy, Jan A, MD, 20 mg at 05/22/20 0839 .  feeding supplement (ENSURE ENLIVE) (ENSURE ENLIVE) liquid 237 mL, 237 mL,  Oral, BID BM, Mikhail, Maryann, DO, 237 mL at 05/22/20 0841 .  fluticasone (FLONASE) 50 MCG/ACT nasal spray 1 spray, 1 spray, Each Nare, Daily, Mansy, Jan A, MD, 1 spray at 05/22/20 0841 .  furosemide (LASIX) injection 20 mg, 20 mg, Intravenous, Q12H, Ottie Glazier, MD, 20 mg at 05/22/20 0612 .  gabapentin (NEURONTIN) tablet 600 mg, 600 mg, Oral, QID, Mansy, Jan A, MD, 600 mg at 05/22/20 1424 .  guaiFENesin (MUCINEX) 12 hr tablet 600 mg, 600 mg, Oral, BID, Mansy, Jan A, MD, 600 mg at 05/22/20 0841 .  hydroxyurea (HYDREA) capsule 500 mg, 500 mg, Oral, Q MTWThF, Mansy, Jan A, MD, 500 mg at 05/22/20 0618 .  ipratropium-albuterol (DUONEB) 0.5-2.5 (3) MG/3ML nebulizer solution 3 mL, 3 mL, Nebulization, QID, Mansy, Jan A, MD, 3 mL at 05/22/20 1123 .  loratadine (CLARITIN) tablet 10 mg, 10 mg, Oral, Daily, Mansy, Jan A, MD, 10 mg at 05/22/20 1017 .  magnesium hydroxide (MILK OF MAGNESIA) suspension 30 mL, 30 mL, Oral, Daily PRN, Mansy, Jan A, MD .  magnesium oxide (MAG-OX) tablet 400 mg, 400 mg, Oral, BID AC & HS, Mansy, Jan A, MD, 400 mg at 05/22/20 0842 .  metoCLOPramide (REGLAN) tablet 5 mg, 5 mg, Oral, Q6H PRN, Mansy, Jan A, MD, 5 mg at 05/22/20 1106 .  midodrine (PROAMATINE) tablet 5 mg, 5 mg, Oral, TID WC, Jahan Friedlander, MD, 5 mg at 05/22/20 1204 .  multivitamin with minerals tablet 1 tablet, 1 tablet, Oral, Daily, Mansy, Jan A, MD, 1 tablet at 05/22/20 0839 .  ondansetron (ZOFRAN) tablet 4 mg, 4 mg, Oral, Q6H PRN **OR** ondansetron (ZOFRAN) injection 4 mg, 4 mg, Intravenous, Q6H PRN, Mansy, Jan A, MD, 4 mg at 05/20/20 2020 .  oxyCODONE (Oxy IR/ROXICODONE) immediate release tablet 5 mg, 5 mg, Oral, Q6H PRN, Mansy, Jan A, MD, 5 mg at 05/21/20 2139 .  oxyCODONE (Oxy IR/ROXICODONE) immediate release tablet 5 mg, 5 mg, Oral, Q6H, Mansy, Jan A, MD, 5 mg at 05/22/20 1104 .  rOPINIRole (REQUIP) tablet 2 mg, 2 mg, Oral, QID, Mansy, Jan A, MD, 2 mg at 05/22/20 1424 .  sodium chloride (OCEAN) 0.65 % nasal  spray 1 spray, 1 spray, Each Nare, PRN, Cristal Ford, DO, 1 spray at 05/22/20 0740 .  sodium chloride flush (NS) 0.9 % injection 3 mL, 3 mL, Intravenous, Q12H, Mikhail, [place], DO, 3 mL at 05/22/20 0842 .  tiZANidine (ZANAFLEX) tablet 4 mg, 4 mg, Oral, TID, Mansy, Jan A, MD, 4 mg at 05/22/20 0840 .  traZODone (DESYREL) tablet 75 mg, 75  mg, Oral, QHS, Mansy, Jan A, MD, 75 mg at 05/21/20 2137    ALLERGIES   Atorvastatin     REVIEW OF SYSTEMS    Review of Systems:  Gen:  Denies  fever, sweats, chills weigh loss  HEENT: Denies blurred vision, double vision, ear pain, eye pain, hearing loss, nose bleeds, sore throat Cardiac:  No dizziness, chest pain or heaviness, chest tightness,edema Resp:   Denies cough or sputum porduction, shortness of breath,wheezing, hemoptysis,  Gi: Denies swallowing difficulty, stomach pain, nausea or vomiting, diarrhea, constipation, bowel incontinence Gu:  Denies bladder incontinence, burning urine Ext:   Denies Joint pain, stiffness or swelling Skin: Denies  skin rash, easy bruising or bleeding or hives Endoc:  Denies polyuria, polydipsia , polyphagia or weight change Psych:   Denies depression, insomnia or hallucinations   Other:  All other systems negative   VS: BP 112/62 (BP Location: Right Arm)   Pulse 92   Temp 97.9 F (36.6 C)   Resp 19   Ht 5\' 5"  (1.651 m)   Wt 51.8 kg   SpO2 91%   BMI 19.00 kg/m      PHYSICAL EXAM    GENERAL:NAD, no fevers, chills, no weakness no fatigue HEAD: Normocephalic, atraumatic.  EYES: Pupils equal, round, reactive to light. Extraocular muscles intact. No scleral icterus.  MOUTH: Moist mucosal membrane. Dentition intact. No abscess noted.  EAR, NOSE, THROAT: Clear without exudates. No external lesions.  NECK: Supple. No thyromegaly. No nodules. No JVD.  PULMONARY: Decreased breath sounds bilaterally CARDIOVASCULAR: S1 and S2. Regular rate and rhythm. No murmurs, rubs, or gallops. No edema. Pedal  pulses 2+ bilaterally.  GASTROINTESTINAL: Soft, nontender, nondistended. No masses. Positive bowel sounds. No hepatosplenomegaly.  MUSCULOSKELETAL: No swelling, clubbing, or edema. Range of motion full in all extremities.  NEUROLOGIC: Cranial nerves II through XII are intact. No gross focal neurological deficits. Sensation intact. Reflexes intact.  SKIN: No ulceration, lesions, rashes, or cyanosis. Skin warm and dry. Turgor intact.  PSYCHIATRIC: Mood, affect within normal limits. The patient is awake, alert and oriented x 3. Insight, judgment intact.       IMAGING    DG Chest 1 View  Addendum Date: 05/21/2020   ADDENDUM REPORT: 05/21/2020 06:03 ADDENDUM: Additional attempts were made to reposition the patient. With diminished patient rotation and less obscuration of the left upper lung. These images reveal increasing now completely confluent opacity in the left lung base with air bronchograms likely reflecting a combination of consolidated lung, volume loss and layering effusion. Increasing opacities are present throughout the right mid to lower lung as well which could reflect further airspace disease or edema as well as small right effusion. This addendum will be called to the ordering clinician or representative by the Radiologist Assistant, and communication documented in the PACS or [employer]. Electronically Signed   By: Lovena Le M.D.   On: 05/21/2020 06:03   Result Date: 05/21/2020 CLINICAL DATA:  Shortness of breath EXAM: CHEST  1 VIEW COMPARISON:  Radiograph 05/19/2020, CT 06/14/2015 FINDINGS: Difficulty with patient positioning with the head projecting over the left mid to upper lung obscuring much of the visible portions of the left lung itself. Additionally there is a steep right anterior obliquity. Redemonstration of some patchy and streaky right basilar opacity. More globally increased opacity in the retrocardiac space. Obscuration of the hemidiaphragms is suggestive of  developing pleural effusions. No visible right pneumothorax. Left apex obscured. Visible mediastinal contours are not significantly changed from prior though  much of the left heart border is obscured. No acute osseous or soft tissue abnormality. Chronic rib deformities are again seen. Telemetry leads overlie the chest. IMPRESSION: 1. Persistent right basilar airspace opacities with developing right effusion. 2. Globally increased opacity in the retrocardiac space, could reflect developing pleural effusion, atelectasis or pneumonia. 3. Marked patient rotation as well patient's head obscuring the left mid to upper lung, limiting evaluation. Electronically Signed: By: Lovena Le M.D. On: 05/21/2020 05:13   CT CHEST W CONTRAST  Result Date: 05/21/2020 CLINICAL DATA:  Respiratory failure history of multiple sclerosis and DVT on Xarelto. EXAM: CT CHEST WITH CONTRAST TECHNIQUE: Multidetector CT imaging of the chest was performed during intravenous contrast administration. CONTRAST:  62mL OMNIPAQUE IOHEXOL 300 MG/ML  SOLN COMPARISON:  06/13/2015 FINDINGS: Cardiovascular: Heart size is normal with small pericardial effusion. Central pulmonary vasculature is normal on venous phase assessment, study not protocol for PE evaluation. Aortic caliber is normal. Mediastinum/Nodes: No axillary adenopathy. Thoracic inlet structures are normal. Lungs/Pleura: Moderate bilateral pleural effusions. Basilar airspace disease bilaterally. Effusion greatest on the RIGHT. Airspace disease greatest on the LEFT with component of volume loss and shift of mediastinal structures from RIGHT to LEFT into the LEFT chest. Upper Abdomen: Incidental imaging of upper abdominal contents without acute process. Musculoskeletal: Chronic deformity of the sternum indicative of prior fracture. Evidence of spinal fusion at the upper portion of the lumbar spine, incompletely imaged on CT data. No acute bone finding. T11 compression with similar appearance.  IMPRESSION: 1. Moderate bilateral pleural effusions with bibasilar airspace disease greatest on the LEFT with component of volume loss and shift of mediastinal structures from RIGHT to LEFT into the LEFT chest. Much of this appears to represent volume loss though there is some areas of variable enhancement that raise the question of concomitant pneumonia particularly at the LEFT lung base. Loss of air bronchograms raising the question of aspiration though there is no material seen in the trachea or larger airways. 2. Small pericardial effusion. 3. Chronic deformity of the sternum indicative of prior fracture. 4. T11 compression with similar appearance. 5. Aortic atherosclerosis. Aortic Atherosclerosis (ICD10-I70.0). Electronically Signed   By: Zetta Bills M.D.   On: 05/21/2020 11:06   DG Chest Port 1 View  Result Date: 05/21/2020 CLINICAL DATA:  Status post right-sided thoracentesis. EXAM: PORTABLE CHEST 1 VIEW COMPARISON:  May 21, 2020 (4:34 a.m.) FINDINGS: The study is limited secondary to limited patient positioning. The lungs are hyperinflated. Mild to moderate severity diffuse chronic appearing increased interstitial lung markings are seen. There is opacification of the lower half of the left lung. No pneumothorax is identified. The cardiac silhouette is borderline in size. Multiple chronic left-sided rib fractures are seen. Degenerative changes seen throughout the thoracic spine. IMPRESSION: 1. Opacification of the lower half of the left lung consistent with moderate-sized pleural effusion and associated atelectasis versus infiltrate. 2. Underlying chronic interstitial lung disease. Electronically Signed   By: Virgina Norfolk M.D.   On: 05/21/2020 16:58   DG Chest Portable 1 View  Result Date: 05/19/2020 CLINICAL DATA:  Sepsis.  Hypotension.  Fever. EXAM: PORTABLE CHEST 1 VIEW COMPARISON:  08/19/2017 FINDINGS: Patient's chin obscures the apices. Significant patient rotation. Stable heart size and  mediastinal contours. Streaky retrocardiac opacity. Patchy right lung base opacity. Possible small pleural effusions. No pneumothorax. No pulmonary edema. Bones under mineralized with scoliotic curvature of the spine. IMPRESSION: 1. Streaky retrocardiac opacity and patchy right lung base opacity, may be atelectasis or pneumonia. Possible small  pleural effusions. 2. Rotated exam. Electronically Signed   By: Keith Rake M.D.   On: 05/19/2020 01:01   US THORACENTESIS ASP PLEURAL SPACE W/IMG GUIDE  Result Date: 05/21/2020 CLINICAL DATA:  Respiratory failure and bilateral pleural effusions, right greater than left. EXAM: ULTRASOUND GUIDED RIGHT THORACENTESIS COMPARISON:  None. PROCEDURE: An ultrasound guided thoracentesis was thoroughly discussed with the patient and questions answered. The benefits, risks, alternatives and complications were also discussed. The patient understands and wishes to proceed with the procedure. Written consent was obtained. Ultrasound was performed to localize and mark an adequate pocket of fluid in the right chest. The area was then prepped and draped in the normal sterile fashion. 1% Lidocaine was used for local anesthesia. Under ultrasound guidance a 6 French Safe-T-Centesis catheter was introduced. Thoracentesis was performed. The catheter was removed and a dressing applied. COMPLICATIONS: None FINDINGS: A total of approximately 550 mL of clear, yellow fluid was removed. A fluid sample was sent for laboratory analysis. IMPRESSION: Successful ultrasound guided right thoracentesis yielding 550 mL of pleural fluid. Electronically Signed   By: Aletta Edouard M.D.   On: 05/21/2020 17:00         ASSESSMENT/PLAN   Acute hypoxemic respiratory failure likley due to bilateral compressive atelectasis  -present on admission   - patient with febrile illness and left lung infiltrate possible CAP  -procalcitonin with only mild elevation -will recollect - thoracentesis performed  and patient improved with now only 1-2L/min [place] - Agree with empiric Cefepime / Zithromax   Bilateral pleural effusions - Reviewed fluid profile- lymphocyte predominant transudate suggestive of CHF vs MS related fluid -will attempt diuresis gently post improved BP -Thoracentesis studies are still peding    BiIateral compressive atelectasis -will drain fluid and diurese -recruitment manuevers with Metaneb therapy utilising saline  -albuterol nebulizer -patient unable to use IS/Flutter due to MS hx   Moderate protein calorie malnutrition   - hindering diuresis and may be cause of effusion   - will replete Albumin and consult RD nutritional consult    Circulatory shock-improved   - hindering ability to diurese- continue midodrine -present on admission - likely related to sepsis secondary to UTI vs CAP - resolved with now transient hypotension        Thank you for allowing me to participate in the care of this patient.   Patient/Family are satisfied with care plan and all questions have been answered.   This document was prepared using Dragon voice recognition software and may include unintentional dictation errors.     Ottie Glazier, M.D.  Division of [place]

## 2020-05-22 NOTE — Progress Notes (Signed)
PROGRESS NOTE    Cynthia Price  CXK:[id number] DOB: October 23, 1946 DOA: 05/18/2020 PCP: Cynthia Ruths, MD   Brief Narrative:  HPI on 05/19/2020 by Dr. Eugenie Norrie Mykeisha Price  is a 74 y.o. female with a known history of multiple sclerosis, DVT on Xarelto, depression, coronary artery disease, IBS, chronic anemia and chronic diastolic CHF who comes from her skilled nursing facility with acute onset of fever for the last couple of days for which she was given Tylenol and was noted to have hypotension that prompted 911 call.  Upon arrival EMS her blood pressure was 60/44 for which she was given IV fluid bolus and blood pressure was up to the 80s in the ER.  No history could be obtained from the patient due to her altered mental status and likely underlying dementia.  Interim history Patient admitted with sepsis secondary to UTI and possibly pneumonia, currently placed on IV antibiotics.  Also noted to have anemia, Xarelto held as FOBT was positive, GI consulted.  Overnight, patient became febrile with tachycardia and hypoxia, repeat chest x-ray was obtained.  Pulmonary consulted. S/p thoracentesis. Assessment & Plan   Sepsis secondary to UTI/pneumonia with pleural effusions -Patient presented with fever, hypotension, tachycardia -Overnight, patient became febrile along with tachycardia and hypoxia -Repeat chest x-ray on 05/21/2020: Reviewed and shows worsening of left-sided pleural effusion and pneumonia -UA: Trace leukocytes, negative nitrites, many bacteria, > 50 WBC -Urine culture shows multiple species, Recollection suggested -Blood culture showed no growth to date -Given fever overnight, will broaden antibiotics.  MRCA PCR was negative.  We will place her on cefepime and discontinue azithromycin and ceftriaxone. -Pulmonology consulted and appreciated -CT chest: Moderate bilateral pleural effusions with bibasilar airspace disease greatest on the left with volume loss and shift of mediastinal  structures from right to left into the left chest.  Small pericardial effusion. -IR consulted and appreciated, status post ultrasound-guided right thoracentesis yielding 550 mL of fluid -Fluid culture pending  Acute hypoxic respiratory failure -Likely multifactorial including pleural effusions and pneumonia -Continue supplemental oxygen to maintain saturations above 90% -s/p thoracentesis -Continue treatment plan as above   Acute on chronic macrocytic anemia/ GI Bleed -Hemoglobin had dropped to 6.6 -Baseline hemoglobin between 9 and 10 -Patient transfused 1 unit PRBC on 05/20/2020, hemoglobin currently 9.7 -Xarelto currently held -Anemia panel obtained, patient with adequate iron and iron storage.  TIBC 133, folate 16.8 -FOBT positive -Gastroenterology consulted and appreciated- and has signed off   Hypotension -Patient was given IV albumin with IV fluids -BP has stabilized, continue to monitor closely -Continue midodrine  ?  Aspiration/dysphagia -Speech therapy consulted, recommending dysphagia 1 diet with thin liquids  Multiple sclerosis -Continue baclofen  Depression -Continue Cymbalta  History of DVT -As above, Xarelto held  T11 compression -Noted on CT chest -Continue supportive care  Malnutrition -Nutrition consulted -Albumin 2.7  DVT Prophylaxis  SCDs  Code Status: DNR  Family Communication: None at bedside  Disposition Plan:  Status is: Inpatient  Remains inpatient appropriate because:Hemodynamically unstable, Ongoing diagnostic testing needed not appropriate for outpatient work up and IV treatments appropriate due to intensity of illness or inability to take PO   Dispo: The patient is from: SNF              Anticipated d/c is to: SNF              Anticipated d/c date is: > 3 days  Patient currently is not medically stable to d/c.   Consultants Gastroenterology Pulmonology Interventional radiology  Procedures  Ultrasound guided  right thoracentesis  Antibiotics   Anti-infectives (From admission, onward)   Start     Dose/Rate Route Frequency Ordered Stop   05/21/20 1200  ceFEPIme (MAXIPIME) 2 g in sodium chloride 0.9 % 100 mL IVPB     Discontinue     2 g 200 mL/hr over 30 Minutes Intravenous Every 12 hours 05/21/20 1121     05/21/20 1000  azithromycin (ZITHROMAX) tablet 500 mg        500 mg Oral  Once 05/20/20 0918 05/21/20 0913   05/19/20 0600  cefTRIAXone (ROCEPHIN) 2 g in sodium chloride 0.9 % 100 mL IVPB  Status:  Discontinued        2 g 200 mL/hr over 30 Minutes Intravenous Every 24 hours 05/19/20 0451 05/21/20 1121   05/19/20 0600  azithromycin (ZITHROMAX) 500 mg in sodium chloride 0.9 % 250 mL IVPB  Status:  Discontinued        500 mg 250 mL/hr over 60 Minutes Intravenous Every 24 hours 05/19/20 0451 05/20/20 0918   05/19/20 0045  ceFEPIme (MAXIPIME) 1 g in sodium chloride 0.9 % 100 mL IVPB        1 g 200 mL/hr over 30 Minutes Intravenous  Once 05/19/20 0036 05/19/20 0125   05/19/20 0045  vancomycin (VANCOCIN) IVPB 1000 mg/200 mL premix        1,000 mg 200 mL/hr over 60 Minutes Intravenous  Once 05/19/20 0036 05/19/20 0230      Subjective:   Cynthia Price seen and examined today.  History of dementia.  Complains of headache this morning.  Denies further abdominal pain.  Denies current nausea or vomiting.  Denies chest pain.  Feels that her breathing has improved since her test yesterday.    Objective:   Vitals:   05/22/20 0530 05/22/20 0601 05/22/20 0631 05/22/20 0710  BP: 117/78 126/81 121/84 126/86  Pulse: 93 98 98 100  Resp:    19  Temp:    99.2 F (37.3 C)  TempSrc:    Oral  SpO2:  (!) 88% 92% 93%  Weight:      Height:        Intake/Output Summary (Last 24 hours) at 05/22/2020 1006 Last data filed at 05/22/2020 3825 Gross per 24 hour  Intake 450.68 ml  Output --  Net 450.68 ml   Filed Weights   05/20/20 0348 05/21/20 0359 05/22/20 0442  Weight: 48.9 kg 50.7 kg 51.8 kg    Exam  General: Well developed, chronically ill-appearing, NAD  HEENT: NCAT, mucous membranes moist.   Cardiovascular: S1 S2 auscultated, RRR  Respiratory: Diminished breath sounds  Abdomen: Soft, nontender, nondistended, + bowel sounds  Extremities: warm dry without cyanosis clubbing or edema.  LUE edema.  Abrasion on right ankle  Neuro: AAOx2 (self, place), dimension, nonfocal  Psych: Appropriate mood and affect   Data Reviewed: I have personally reviewed following labs and imaging studies  CBC: Recent Labs  Lab 05/19/20 0009 05/19/20 0009 05/19/20 0503 05/19/20 1840 05/19/20 2353 05/20/20 0708 05/20/20 2007 05/21/20 0636 05/22/20 0554  WBC 10.4  --  9.7  --   --   --   --  10.1 10.2  NEUTROABS 8.6*  --   --   --   --   --   --   --   --   HGB 8.3*   < > 7.2*   < >  6.6* 7.2* 9.3* 9.5* 9.7*  HCT 26.4*   < > 23.6*   < > 20.6* 23.3* 29.2* 29.6* 29.4*  MCV 101.9*  --  103.1*  --   --   --   --  97.4 96.4  PLT 302  --  260  --   --   --   --  344 340   < > = values in this interval not displayed.   Basic Metabolic Panel: Recent Labs  Lab 05/19/20 0009 05/19/20 0503 05/21/20 0636 05/22/20 0554  NA 137 142 141 143  K 4.2 3.9 3.9 4.0  CL 98 105 108 110  CO2 31 30 26 24   GLUCOSE 138* 128* 105* 84  BUN 23 21 11 15   CREATININE 0.81 0.66 0.64 0.65  CALCIUM 8.1* 8.4* 8.1* 8.4*   GFR: Estimated Creatinine Clearance: 50.5 mL/min (by C-G formula based on SCr of 0.65 mg/dL). Liver Function Tests: Recent Labs  Lab 05/19/20 0009  AST 18  ALT 10  ALKPHOS 72  BILITOT 0.6  PROT 6.3*  ALBUMIN 2.7*   No results for input(s): LIPASE, AMYLASE in the last 168 hours. No results for input(s): AMMONIA in the last 168 hours. Coagulation Profile: Recent Labs  Lab 05/19/20 0503  INR 1.7*   Cardiac Enzymes: No results for input(s): CKTOTAL, CKMB, CKMBINDEX, TROPONINI in the last 168 hours. BNP (last 3 results) No results for input(s): PROBNP in the last 8760  hours. HbA1C: No results for input(s): HGBA1C in the last 72 hours. CBG: No results for input(s): GLUCAP in the last 168 hours. Lipid Profile: No results for input(s): CHOL, HDL, LDLCALC, TRIG, CHOLHDL, LDLDIRECT in the last 72 hours. Thyroid Function Tests: No results for input(s): TSH, T4TOTAL, FREET4, T3FREE, THYROIDAB in the last 72 hours. Anemia Panel: Recent Labs    05/20/20 0708  VITAMINB12 1,403*  FOLATE 16.1  FERRITIN 163  TIBC 133*  IRON 32  RETICCTPCT 1.6   Urine analysis:    Component Value Date/Time   COLORURINE AMBER (A) 05/19/2020 0010   APPEARANCEUR TURBID (A) 05/19/2020 0010   APPEARANCEUR Clear 09/25/2015 1020   LABSPEC 1.016 05/19/2020 0010   LABSPEC 1.011 11/02/2014 1914   PHURINE 8.0 05/19/2020 0010   GLUCOSEU NEGATIVE 05/19/2020 0010   GLUCOSEU Negative 11/02/2014 1914   HGBUR NEGATIVE 05/19/2020 0010   HGBUR trace-intact 04/29/2010 0952   BILIRUBINUR NEGATIVE 05/19/2020 0010   BILIRUBINUR Negative 09/25/2015 1020   BILIRUBINUR Negative 11/02/2014 1914   KETONESUR 5 (A) 05/19/2020 0010   PROTEINUR 100 (A) 05/19/2020 0010   UROBILINOGEN negative 02/04/2014 1603   UROBILINOGEN 0.2 04/29/2010 0952   NITRITE NEGATIVE 05/19/2020 0010   LEUKOCYTESUR TRACE (A) 05/19/2020 0010   LEUKOCYTESUR Negative 11/02/2014 1914   Sepsis Labs: @LABRCNTIP (procalcitonin:4,lacticidven:4)  ) Recent Results (from the past 240 hour(s))  Blood culture (routine x 2)     Status: None (Preliminary result)   Collection Time: 05/19/20 12:10 AM   Specimen: Right Antecubital; Blood  Result Value Ref Range Status   Specimen Description RIGHT ANTECUBITAL  Final   Special Requests   Final    BOTTLES DRAWN AEROBIC AND ANAEROBIC Blood Culture results may not be optimal due to an excessive volume of blood received in culture bottles   Culture   Final    NO GROWTH 3 DAYS Performed at [facility], [address] [place], [place] [address]    Report Status PENDING   Incomplete  Respiratory Panel by RT PCR (Flu A&B, Covid) -  Nasopharyngeal Swab     Status: None   Collection Time: 05/19/20 12:10 AM   Specimen: Nasopharyngeal Swab  Result Value Ref Range Status   SARS Coronavirus 2 by RT PCR NEGATIVE NEGATIVE Final    Comment: (NOTE) SARS-CoV-2 target nucleic acids are NOT DETECTED.  The SARS-CoV-2 RNA is generally detectable in upper respiratoy specimens during the acute phase of infection. The lowest concentration of SARS-CoV-2 viral copies this assay can detect is 131 copies/mL. A negative result does not preclude SARS-Cov-2 infection and should not be used as the sole basis for treatment or other patient management decisions. A negative result may occur with  improper specimen collection/handling, submission of specimen other than nasopharyngeal swab, presence of viral mutation(s) within the areas targeted by this assay, and inadequate number of viral copies (<131 copies/mL). A negative result must be combined with clinical observations, patient history, and epidemiological information. The expected result is Negative.  Fact Sheet for Patients:  PinkCheek.be  Fact Sheet for Healthcare Providers:  GravelBags.it  This test is no t yet approved or cleared by the [place] FDA and  has been authorized for detection and/or diagnosis of SARS-CoV-2 by FDA under an Emergency Use Authorization (EUA). This EUA will remain  in effect (meaning this test can be used) for the duration of the COVID-19 declaration under Section 564(b)(1) of the Act, 21 U.S.C. section 360bbb-3(b)(1), unless the authorization is terminated or revoked sooner.     Influenza A by PCR NEGATIVE NEGATIVE Final   Influenza B by PCR NEGATIVE NEGATIVE Final    Comment: (NOTE) The Xpert Xpress SARS-CoV-2/FLU/RSV assay is intended as an aid in  the diagnosis of influenza from Nasopharyngeal swab specimens and  should not  be used as a sole basis for treatment. Nasal washings and  aspirates are unacceptable for Xpert Xpress SARS-CoV-2/FLU/RSV  testing.  Fact Sheet for Patients: PinkCheek.be  Fact Sheet for Healthcare Providers: GravelBags.it  This test is not yet approved or cleared by the [place] FDA and  has been authorized for detection and/or diagnosis of SARS-CoV-2 by  FDA under an Emergency Use Authorization (EUA). This EUA will remain  in effect (meaning this test can be used) for the duration of the  Covid-19 declaration under Section 564(b)(1) of the Act, 21  U.S.C. section 360bbb-3(b)(1), unless the authorization is  terminated or revoked. Performed at [facility], [address] [place], [place] [address]   Urine culture     Status: Abnormal   Collection Time: 05/19/20 12:11 AM   Specimen: Urine, Clean Catch  Result Value Ref Range Status   Specimen Description   Final    URINE, CLEAN CATCH Performed at [facility], [address] [place], [place] [address]    Special Requests   Final    NONE Performed at [facility], [address] [place], [place] [address]    Culture MULTIPLE SPECIES PRESENT, SUGGEST RECOLLECTION (A)  Final   Report Status 05/20/2020 FINAL  Final  Blood culture (routine x 2)     Status: None (Preliminary result)   Collection Time: 05/19/20 12:27 AM   Specimen: BLOOD RIGHT HAND  Result Value Ref Range Status   Specimen Description BLOOD RIGHT HAND  Final   Special Requests   Final    BOTTLES DRAWN AEROBIC AND ANAEROBIC Blood Culture adequate volume   Culture   Final    NO GROWTH 3 DAYS Performed at [facility], [address], [place], [place] [address]  Report Status PENDING  Incomplete  MRSA PCR Screening     Status: None   Collection Time: 05/19/20  6:31 PM   Specimen: Nasopharyngeal  Result Value Ref Range Status   MRSA by PCR NEGATIVE  NEGATIVE Final    Comment:        The GeneXpert MRSA Assay (FDA approved for NASAL specimens only), is one component of a comprehensive MRSA colonization surveillance program. It is not intended to diagnose MRSA infection nor to guide or monitor treatment for MRSA infections. Performed at [facility], [address] [place], [place] [address]   Body fluid culture     Status: None (Preliminary result)   Collection Time: 05/21/20  4:38 PM   Specimen: PATH Cytology Pleural fluid  Result Value Ref Range Status   Specimen Description   Final    PLEURAL Performed at [facility], [address] [place], [place] [address]    Special Requests NONE  Final   Gram Stain   Final    FEW WBC PRESENT, PREDOMINANTLY MONONUCLEAR NO ORGANISMS SEEN Performed at [facility], [place] [address], [place], [place] [address]    Culture PENDING  Incomplete   Report Status PENDING  Incomplete      Radiology Studies: DG Chest 1 View  Addendum Date: 05/21/2020   ADDENDUM REPORT: 05/21/2020 06:03 ADDENDUM: Additional attempts were made to reposition the patient. With diminished patient rotation and less obscuration of the left upper lung. These images reveal increasing now completely confluent opacity in the left lung base with air bronchograms likely reflecting a combination of consolidated lung, volume loss and layering effusion. Increasing opacities are present throughout the right mid to lower lung as well which could reflect further airspace disease or edema as well as small right effusion. This addendum will be called to the ordering clinician or representative by the Radiologist Assistant, and communication documented in the PACS or [employer]. Electronically Signed   By: Lovena Le M.D.   On: 05/21/2020 06:03   Result Date: 05/21/2020 CLINICAL DATA:  Shortness of breath EXAM: CHEST  1 VIEW COMPARISON:  Radiograph 05/19/2020, CT 06/14/2015 FINDINGS: Difficulty with  patient positioning with the head projecting over the left mid to upper lung obscuring much of the visible portions of the left lung itself. Additionally there is a steep right anterior obliquity. Redemonstration of some patchy and streaky right basilar opacity. More globally increased opacity in the retrocardiac space. Obscuration of the hemidiaphragms is suggestive of developing pleural effusions. No visible right pneumothorax. Left apex obscured. Visible mediastinal contours are not significantly changed from prior though much of the left heart border is obscured. No acute osseous or soft tissue abnormality. Chronic rib deformities are again seen. Telemetry leads overlie the chest. IMPRESSION: 1. Persistent right basilar airspace opacities with developing right effusion. 2. Globally increased opacity in the retrocardiac space, could reflect developing pleural effusion, atelectasis or pneumonia. 3. Marked patient rotation as well patient's head obscuring the left mid to upper lung, limiting evaluation. Electronically Signed: By: Lovena Le M.D. On: 05/21/2020 05:13   CT CHEST W CONTRAST  Result Date: 05/21/2020 CLINICAL DATA:  Respiratory failure history of multiple sclerosis and DVT on Xarelto. EXAM: CT CHEST WITH CONTRAST TECHNIQUE: Multidetector CT imaging of the chest was performed during intravenous contrast administration. CONTRAST:  29mL OMNIPAQUE IOHEXOL 300 MG/ML  SOLN COMPARISON:  06/13/2015 FINDINGS: Cardiovascular: Heart size is normal with small pericardial effusion. Central pulmonary vasculature is normal on venous phase  assessment, study not protocol for PE evaluation. Aortic caliber is normal. Mediastinum/Nodes: No axillary adenopathy. Thoracic inlet structures are normal. Lungs/Pleura: Moderate bilateral pleural effusions. Basilar airspace disease bilaterally. Effusion greatest on the RIGHT. Airspace disease greatest on the LEFT with component of volume loss and shift of mediastinal  structures from RIGHT to LEFT into the LEFT chest. Upper Abdomen: Incidental imaging of upper abdominal contents without acute process. Musculoskeletal: Chronic deformity of the sternum indicative of prior fracture. Evidence of spinal fusion at the upper portion of the lumbar spine, incompletely imaged on CT data. No acute bone finding. T11 compression with similar appearance. IMPRESSION: 1. Moderate bilateral pleural effusions with bibasilar airspace disease greatest on the LEFT with component of volume loss and shift of mediastinal structures from RIGHT to LEFT into the LEFT chest. Much of this appears to represent volume loss though there is some areas of variable enhancement that raise the question of concomitant pneumonia particularly at the LEFT lung base. Loss of air bronchograms raising the question of aspiration though there is no material seen in the trachea or larger airways. 2. Small pericardial effusion. 3. Chronic deformity of the sternum indicative of prior fracture. 4. T11 compression with similar appearance. 5. Aortic atherosclerosis. Aortic Atherosclerosis (ICD10-I70.0). Electronically Signed   By: Zetta Bills M.D.   On: 05/21/2020 11:06   DG Chest Port 1 View  Result Date: 05/21/2020 CLINICAL DATA:  Status post right-sided thoracentesis. EXAM: PORTABLE CHEST 1 VIEW COMPARISON:  May 21, 2020 (4:34 a.m.) FINDINGS: The study is limited secondary to limited patient positioning. The lungs are hyperinflated. Mild to moderate severity diffuse chronic appearing increased interstitial lung markings are seen. There is opacification of the lower half of the left lung. No pneumothorax is identified. The cardiac silhouette is borderline in size. Multiple chronic left-sided rib fractures are seen. Degenerative changes seen throughout the thoracic spine. IMPRESSION: 1. Opacification of the lower half of the left lung consistent with moderate-sized pleural effusion and associated atelectasis versus  infiltrate. 2. Underlying chronic interstitial lung disease. Electronically Signed   By: Virgina Norfolk M.D.   On: 05/21/2020 16:58   US THORACENTESIS ASP PLEURAL SPACE W/IMG GUIDE  Result Date: 05/21/2020 CLINICAL DATA:  Respiratory failure and bilateral pleural effusions, right greater than left. EXAM: ULTRASOUND GUIDED RIGHT THORACENTESIS COMPARISON:  None. PROCEDURE: An ultrasound guided thoracentesis was thoroughly discussed with the patient and questions answered. The benefits, risks, alternatives and complications were also discussed. The patient understands and wishes to proceed with the procedure. Written consent was obtained. Ultrasound was performed to localize and mark an adequate pocket of fluid in the right chest. The area was then prepped and draped in the normal sterile fashion. 1% Lidocaine was used for local anesthesia. Under ultrasound guidance a 6 French Safe-T-Centesis catheter was introduced. Thoracentesis was performed. The catheter was removed and a dressing applied. COMPLICATIONS: None FINDINGS: A total of approximately 550 mL of clear, yellow fluid was removed. A fluid sample was sent for laboratory analysis. IMPRESSION: Successful ultrasound guided right thoracentesis yielding 550 mL of pleural fluid. Electronically Signed   By: Aletta Edouard M.D.   On: 05/21/2020 17:00     Scheduled Meds: . baclofen  20 mg Oral TID  . diclofenac sodium  4 g Topical QID  . escitalopram  20 mg Oral Daily  . famotidine  20 mg Oral BID  . feeding supplement (ENSURE ENLIVE)  237 mL Oral BID BM  . fluticasone  1 spray Each Nare Daily  .  furosemide  20 mg Intravenous Q12H  . gabapentin  600 mg Oral QID  . guaiFENesin  600 mg Oral BID  . hydroxyurea  500 mg Oral Q MTWThF  . ipratropium-albuterol  3 mL Nebulization QID  . loratadine  10 mg Oral Daily  . magnesium oxide  400 mg Oral BID AC & HS  . midodrine  5 mg Oral TID WC  . multivitamin with minerals  1 tablet Oral Daily  .  oxyCODONE  5 mg Oral Q6H  . rOPINIRole  2 mg Oral QID  . sodium chloride flush  3 mL Intravenous Q12H  . tiZANidine  4 mg Oral TID  . traZODone  75 mg Oral QHS   Continuous Infusions: . sodium chloride Stopped (05/21/20 2258)  . ceFEPime (MAXIPIME) IV 2 g (05/22/20 0838)     LOS: 3 days   Time Spent in minutes   45 minutes  Toneisha Savary D.O. on 05/22/2020 at 10:06 AM  Between 7am to 7pm - Please see pager noted on [email or web address]  After 7pm go to [email or web address]  And look for the night coverage person covering for me after hours  [facility] Office  [phone]

## 2020-05-23 ENCOUNTER — Inpatient Hospital Stay: Payer: Medicare Other

## 2020-05-23 DIAGNOSIS — L8995 Pressure ulcer of unspecified site, unstageable: Secondary | ICD-10-CM

## 2020-05-23 LAB — CBC
HCT: 31.9 % — ABNORMAL LOW (ref 36.0–46.0)
Hemoglobin: 10.2 g/dL — ABNORMAL LOW (ref 12.0–15.0)
MCH: 31.1 pg (ref 26.0–34.0)
MCHC: 32 g/dL (ref 30.0–36.0)
MCV: 97.3 fL (ref 80.0–100.0)
Platelets: 366 10*3/uL (ref 150–400)
RBC: 3.28 MIL/uL — ABNORMAL LOW (ref 3.87–5.11)
RDW: 15.9 % — ABNORMAL HIGH (ref 11.5–15.5)
WBC: 10.8 10*3/uL — ABNORMAL HIGH (ref 4.0–10.5)
nRBC: 0 % (ref 0.0–0.2)

## 2020-05-23 LAB — BASIC METABOLIC PANEL
Anion gap: 10 (ref 5–15)
BUN: 18 mg/dL (ref 8–23)
CO2: 25 mmol/L (ref 22–32)
Calcium: 8.4 mg/dL — ABNORMAL LOW (ref 8.9–10.3)
Chloride: 104 mmol/L (ref 98–111)
Creatinine, Ser: 0.66 mg/dL (ref 0.44–1.00)
GFR calc Af Amer: >60 mL/min (ref >60)
GFR calc non Af Amer: >60 mL/min (ref >60)
Glucose, Bld: 98 mg/dL (ref 70–99)
Potassium: 3.6 mmol/L (ref 3.5–5.1)
Sodium: 139 mmol/L (ref 135–145)

## 2020-05-23 LAB — ACID FAST SMEAR (AFB, MYCOBACTERIA): Acid Fast Smear: NEGATIVE

## 2020-05-23 LAB — PROCALCITONIN: Procalcitonin: 0.74 ng/mL

## 2020-05-23 MED ORDER — COSYNTROPIN 0.25 MG IJ SOLR
0.2500 mg | Freq: Once | INTRAMUSCULAR | Status: AC
  Administered 2020-05-24: 0.25 mg via INTRAVENOUS
  Filled 2020-05-23: qty 0.25

## 2020-05-23 MED ORDER — SODIUM CHLORIDE 0.9 % IV SOLN
500.0000 mg | INTRAVENOUS | Status: DC
  Administered 2020-05-23 – 2020-05-26 (×4): 500 mg via INTRAVENOUS
  Filled 2020-05-23 (×5): qty 500

## 2020-05-23 MED ORDER — MIDODRINE HCL 5 MG PO TABS
10.0000 mg | ORAL_TABLET | Freq: Three times a day (TID) | ORAL | Status: DC
  Administered 2020-05-23 – 2020-05-25 (×6): 10 mg via ORAL
  Administered 2020-05-25: 5 mg via ORAL
  Administered 2020-05-26 – 2020-05-28 (×5): 10 mg via ORAL
  Filled 2020-05-23 (×13): qty 2

## 2020-05-23 NOTE — Progress Notes (Signed)
PROGRESS NOTE    Cynthia Price  MWU:[id number] DOB: 1946-07-19 DOA: 05/18/2020 PCP: [name], MD   Brief Narrative:  HPI on 05/19/2020 by Dr. Eugenie Norrie Cynthia Price  is a 74 y.o. female with a known history of multiple sclerosis, DVT on Xarelto, depression, coronary artery disease, IBS, chronic anemia and chronic diastolic CHF who comes from her skilled nursing facility with acute onset of fever for the last couple of days for which she was given Tylenol and was noted to have hypotension that prompted 911 call.  Upon arrival EMS her blood pressure was 60/44 for which she was given IV fluid bolus and blood pressure was up to the 80s in the ER.  No history could be obtained from the patient due to her altered mental status and likely underlying dementia.  Interim history Patient admitted with sepsis secondary to UTI and possibly pneumonia, currently placed on IV antibiotics.  Also noted to have anemia, Xarelto held as FOBT was positive, GI consulted.  Overnight, patient became febrile with tachycardia and hypoxia, repeat chest x-ray was obtained.  Pulmonary consulted. S/p thoracentesis. Assessment & Plan   Sepsis secondary to UTI/pneumonia with pleural effusions -Patient presented with fever, hypotension, tachycardia -Overnight, patient became febrile along with tachycardia and hypoxia -Repeat chest x-ray on 05/21/2020: Reviewed and shows worsening of left-sided pleural effusion and pneumonia -UA: Trace leukocytes, negative nitrites, many bacteria, > 50 WBC -Urine culture shows multiple species, Recollection suggested -Blood culture showed no growth to date -Given fever overnight, will broaden antibiotics.  MRCA PCR was negative.  We will place her on cefepime and discontinue azithromycin and ceftriaxone. -Pulmonology consulted and appreciated -CT chest: Moderate bilateral pleural effusions with bibasilar airspace disease greatest on the left with volume loss and shift of mediastinal  structures from right to left into the left chest.  Small pericardial effusion. -IR consulted and appreciated, status post ultrasound-guided right thoracentesis yielding 550 mL of fluid -Fluid culture shows no growth to date -Fluid appears transudative  Acute hypoxic respiratory failure -Likely multifactorial including pleural effusions and pneumonia -Continue supplemental oxygen to maintain saturations above 90% (patient has been weaned down to 1 to 2 L from 4 L previously) -s/p thoracentesis -Continue treatment plan as above   Acute on chronic macrocytic anemia/ GI Bleed -Hemoglobin had dropped to 6.6 -Baseline hemoglobin between 9 and 10 -Patient transfused 1 unit PRBC on 05/20/2020, hemoglobin currently 10.2 -Xarelto currently held -Anemia panel obtained, patient with adequate iron and iron storage.  TIBC 133, folate 16.8 -FOBT positive -Gastroenterology consulted and appreciated- and has signed off -Given that hemoglobin is currently stable, will consider restarting Xarelto during hospitalization   Hypotension -Patient was given IV albumin with IV fluids -BP has stabilized, continue to monitor closely -Continue midodrine -Blood pressures have been more stable and normotensive  ?  Aspiration/dysphagia -Speech therapy consulted, recommending dysphagia 1 diet with thin liquids  Multiple sclerosis -Continue baclofen  Depression -Continue Cymbalta  History of DVT -As above, Xarelto held  T11 compression -Noted on CT chest -Continue supportive care  Malnutrition -Nutrition consulted-continue supplements -Albumin 2.7  DVT Prophylaxis  SCDs  Code Status: DNR  Family Communication: None at bedside  Disposition Plan:  Status is: Inpatient  Remains inpatient appropriate because:Hemodynamically unstable, Ongoing diagnostic testing needed not appropriate for outpatient work up and IV treatments appropriate due to intensity of illness or inability to take PO   Dispo:  The patient is from: SNF  Anticipated d/c is to: SNF              Anticipated d/c date is: > 3 days              Patient currently is not medically stable to d/c.   Consultants Gastroenterology Pulmonology Interventional radiology  Procedures  Ultrasound guided right thoracentesis  Antibiotics   Anti-infectives (From admission, onward)   Start     Dose/Rate Route Frequency Ordered Stop   05/21/20 1200  ceFEPIme (MAXIPIME) 2 g in sodium chloride 0.9 % 100 mL IVPB     Discontinue     2 g 200 mL/hr over 30 Minutes Intravenous Every 12 hours 05/21/20 1121     05/21/20 1000  azithromycin (ZITHROMAX) tablet 500 mg        500 mg Oral  Once 05/20/20 0918 05/21/20 0913   05/19/20 0600  cefTRIAXone (ROCEPHIN) 2 g in sodium chloride 0.9 % 100 mL IVPB  Status:  Discontinued        2 g 200 mL/hr over 30 Minutes Intravenous Every 24 hours 05/19/20 0451 05/21/20 1121   05/19/20 0600  azithromycin (ZITHROMAX) 500 mg in sodium chloride 0.9 % 250 mL IVPB  Status:  Discontinued        500 mg 250 mL/hr over 60 Minutes Intravenous Every 24 hours 05/19/20 0451 05/20/20 0918   05/19/20 0045  ceFEPIme (MAXIPIME) 1 g in sodium chloride 0.9 % 100 mL IVPB        1 g 200 mL/hr over 30 Minutes Intravenous  Once 05/19/20 0036 05/19/20 0125   05/19/20 0045  vancomycin (VANCOCIN) IVPB 1000 mg/200 mL premix        1,000 mg 200 mL/hr over 60 Minutes Intravenous  Once 05/19/20 0036 05/19/20 0230      Subjective:   Cynthia Price seen and examined today.  History of dementia.  Continues to have some headache today.  Denies further abdominal pain.  Denies chest pain, nausea, vomiting, dizziness.  Feels her shortness of breath has improved.  complains of headache this morning.  Denies further abdominal pain.  Denies current nausea or vomiting.  Denies chest pain.  Feels that her breathing has improved since her test yesterday.    Objective:   Vitals:   05/23/20 0600 05/23/20 0725 05/23/20 0827  05/23/20 0945  BP: 129/90  113/72   Pulse: (!) 104  (!) 104   Resp: 20  16   Temp: 99.2 F (37.3 C)  99.2 F (37.3 C)   TempSrc: Oral  Oral   SpO2: 93% 91% 90% 93%  Weight:      Height:        Intake/Output Summary (Last 24 hours) at 05/23/2020 1028 Last data filed at 05/23/2020 0941 Gross per 24 hour  Intake 647.82 ml  Output 1100 ml  Net -452.18 ml   Filed Weights   05/20/20 0348 05/21/20 0359 05/22/20 0442  Weight: 48.9 kg 50.7 kg 51.8 kg   Exam  General: Well developed, chronically ill appearing, NAD  HEENT: NCAT, mucous membranes moist.   Cardiovascular: S1 S2 auscultated, RRR  Respiratory: Clear to auscultation bilaterally   Abdomen: Soft, nontender, nondistended, + bowel sounds  Extremities: warm dry without cyanosis clubbing or edema. LUE edema improving. Abrasion on the right ankle  Neuro: AAOx2 (self, place), dimension, nonfocal  Psych: Appropriate mood and affect, pleasant  Data Reviewed: I have personally reviewed following labs and imaging studies  CBC: Recent Labs  Lab 05/19/20 0009 05/19/20 0009  05/19/20 0503 05/19/20 1840 05/20/20 0708 05/20/20 2007 05/21/20 0636 05/22/20 0554 05/23/20 0316  WBC 10.4  --  9.7  --   --   --  10.1 10.2 10.8*  NEUTROABS 8.6*  --   --   --   --   --   --   --   --   HGB 8.3*   < > 7.2*   < > 7.2* 9.3* 9.5* 9.7* 10.2*  HCT 26.4*   < > 23.6*   < > 23.3* 29.2* 29.6* 29.4* 31.9*  MCV 101.9*  --  103.1*  --   --   --  97.4 96.4 97.3  PLT 302  --  260  --   --   --  344 340 366   < > = values in this interval not displayed.   Basic Metabolic Panel: Recent Labs  Lab 05/19/20 0009 05/19/20 0503 05/21/20 0636 05/22/20 0554 05/23/20 0316  NA 137 142 141 143 139  K 4.2 3.9 3.9 4.0 3.6  CL 98 105 108 110 104  CO2 31 30 26 24 25   GLUCOSE 138* 128* 105* 84 98  BUN 23 21 11 15 18   CREATININE 0.81 0.66 0.64 0.65 0.66  CALCIUM 8.1* 8.4* 8.1* 8.4* 8.4*   GFR: Estimated Creatinine Clearance: 50.5 mL/min (by C-G  formula based on SCr of 0.66 mg/dL). Liver Function Tests: Recent Labs  Lab 05/19/20 0009  AST 18  ALT 10  ALKPHOS 72  BILITOT 0.6  PROT 6.3*  ALBUMIN 2.7*   No results for input(s): LIPASE, AMYLASE in the last 168 hours. No results for input(s): AMMONIA in the last 168 hours. Coagulation Profile: Recent Labs  Lab 05/19/20 0503  INR 1.7*   Cardiac Enzymes: No results for input(s): CKTOTAL, CKMB, CKMBINDEX, TROPONINI in the last 168 hours. BNP (last 3 results) No results for input(s): PROBNP in the last 8760 hours. HbA1C: No results for input(s): HGBA1C in the last 72 hours. CBG: No results for input(s): GLUCAP in the last 168 hours. Lipid Profile: No results for input(s): CHOL, HDL, LDLCALC, TRIG, CHOLHDL, LDLDIRECT in the last 72 hours. Thyroid Function Tests: No results for input(s): TSH, T4TOTAL, FREET4, T3FREE, THYROIDAB in the last 72 hours. Anemia Panel: No results for input(s): VITAMINB12, FOLATE, FERRITIN, TIBC, IRON, RETICCTPCT in the last 72 hours. Urine analysis:    Component Value Date/Time   COLORURINE AMBER (A) 05/19/2020 0010   APPEARANCEUR TURBID (A) 05/19/2020 0010   APPEARANCEUR Clear 09/25/2015 1020   LABSPEC 1.016 05/19/2020 0010   LABSPEC 1.011 11/02/2014 1914   PHURINE 8.0 05/19/2020 0010   GLUCOSEU NEGATIVE 05/19/2020 0010   GLUCOSEU Negative 11/02/2014 1914   HGBUR NEGATIVE 05/19/2020 0010   HGBUR trace-intact 04/29/2010 0952   BILIRUBINUR NEGATIVE 05/19/2020 0010   BILIRUBINUR Negative 09/25/2015 1020   BILIRUBINUR Negative 11/02/2014 1914   KETONESUR 5 (A) 05/19/2020 0010   PROTEINUR 100 (A) 05/19/2020 0010   UROBILINOGEN negative 02/04/2014 1603   UROBILINOGEN 0.2 04/29/2010 0952   NITRITE NEGATIVE 05/19/2020 0010   LEUKOCYTESUR TRACE (A) 05/19/2020 0010   LEUKOCYTESUR Negative 11/02/2014 1914   Sepsis Labs: @LABRCNTIP (procalcitonin:4,lacticidven:4)  ) Recent Results (from the past 240 hour(s))  Blood culture (routine x 2)      Status: None (Preliminary result)   Collection Time: 05/19/20 12:10 AM   Specimen: Right Antecubital; Blood  Result Value Ref Range Status   Specimen Description RIGHT ANTECUBITAL  Final   Special Requests   Final  BOTTLES DRAWN AEROBIC AND ANAEROBIC Blood Culture results may not be optimal due to an excessive volume of blood received in culture bottles   Culture   Final    NO GROWTH 4 DAYS Performed at [facility], [address] [place], [place] [address]    Report Status PENDING  Incomplete  Respiratory Panel by RT PCR (Flu A&B, Covid) - Nasopharyngeal Swab     Status: None   Collection Time: 05/19/20 12:10 AM   Specimen: Nasopharyngeal Swab  Result Value Ref Range Status   SARS Coronavirus 2 by RT PCR NEGATIVE NEGATIVE Final    Comment: (NOTE) SARS-CoV-2 target nucleic acids are NOT DETECTED.  The SARS-CoV-2 RNA is generally detectable in upper respiratoy specimens during the acute phase of infection. The lowest concentration of SARS-CoV-2 viral copies this assay can detect is 131 copies/mL. A negative result does not preclude SARS-Cov-2 infection and should not be used as the sole basis for treatment or other patient management decisions. A negative result may occur with  improper specimen collection/handling, submission of specimen other than nasopharyngeal swab, presence of viral mutation(s) within the areas targeted by this assay, and inadequate number of viral copies (<131 copies/mL). A negative result must be combined with clinical observations, patient history, and epidemiological information. The expected result is Negative.  Fact Sheet for Patients:  PinkCheek.be  Fact Sheet for Healthcare Providers:  GravelBags.it  This test is no t yet approved or cleared by the [place] FDA and  has been authorized for detection and/or diagnosis of SARS-CoV-2 by FDA under an Emergency Use Authorization  (EUA). This EUA will remain  in effect (meaning this test can be used) for the duration of the COVID-19 declaration under Section 564(b)(1) of the Act, 21 U.S.C. section 360bbb-3(b)(1), unless the authorization is terminated or revoked sooner.     Influenza A by PCR NEGATIVE NEGATIVE Final   Influenza B by PCR NEGATIVE NEGATIVE Final    Comment: (NOTE) The Xpert Xpress SARS-CoV-2/FLU/RSV assay is intended as an aid in  the diagnosis of influenza from Nasopharyngeal swab specimens and  should not be used as a sole basis for treatment. Nasal washings and  aspirates are unacceptable for Xpert Xpress SARS-CoV-2/FLU/RSV  testing.  Fact Sheet for Patients: PinkCheek.be  Fact Sheet for Healthcare Providers: GravelBags.it  This test is not yet approved or cleared by the [place] FDA and  has been authorized for detection and/or diagnosis of SARS-CoV-2 by  FDA under an Emergency Use Authorization (EUA). This EUA will remain  in effect (meaning this test can be used) for the duration of the  Covid-19 declaration under Section 564(b)(1) of the Act, 21  U.S.C. section 360bbb-3(b)(1), unless the authorization is  terminated or revoked. Performed at [facility], [address] [place], [place] [address]   Urine culture     Status: Abnormal   Collection Time: 05/19/20 12:11 AM   Specimen: Urine, Clean Catch  Result Value Ref Range Status   Specimen Description   Final    URINE, CLEAN CATCH Performed at [facility], [address], [place], [place] [address]    Special Requests   Final    NONE Performed at [facility], [address] [place], [place] [address]    Culture MULTIPLE SPECIES PRESENT, SUGGEST RECOLLECTION (A)  Final   Report Status 05/20/2020 FINAL  Final  Blood culture (routine x 2)     Status: None (Preliminary result)   Collection Time: 05/19/20 12:27  AM   Specimen: BLOOD  RIGHT HAND  Result Value Ref Range Status   Specimen Description BLOOD RIGHT HAND  Final   Special Requests   Final    BOTTLES DRAWN AEROBIC AND ANAEROBIC Blood Culture adequate volume   Culture   Final    NO GROWTH 4 DAYS Performed at [facility], [address] [place], [place] [address]    Report Status PENDING  Incomplete  MRSA PCR Screening     Status: None   Collection Time: 05/19/20  6:31 PM   Specimen: Nasopharyngeal  Result Value Ref Range Status   MRSA by PCR NEGATIVE NEGATIVE Final    Comment:        The GeneXpert MRSA Assay (FDA approved for NASAL specimens only), is one component of a comprehensive MRSA colonization surveillance program. It is not intended to diagnose MRSA infection nor to guide or monitor treatment for MRSA infections. Performed at [facility], [address] [place], [place] [address]   Body fluid culture     Status: None (Preliminary result)   Collection Time: 05/21/20  4:38 PM   Specimen: PATH Cytology Pleural fluid  Result Value Ref Range Status   Specimen Description   Final    PLEURAL Performed at [facility], [address] [place], [place] [address]    Special Requests NONE  Final   Gram Stain   Final    FEW WBC PRESENT, PREDOMINANTLY MONONUCLEAR NO ORGANISMS SEEN    Culture   Final    NO GROWTH 2 DAYS Performed at [facility], [place] [address] [place], [place] [address]    Report Status PENDING  Incomplete      Radiology Studies: CT CHEST W CONTRAST  Result Date: 05/21/2020 CLINICAL DATA:  Respiratory failure history of multiple sclerosis and DVT on Xarelto. EXAM: CT CHEST WITH CONTRAST TECHNIQUE: Multidetector CT imaging of the chest was performed during intravenous contrast administration. CONTRAST:  29mL OMNIPAQUE IOHEXOL 300 MG/ML  SOLN COMPARISON:  06/13/2015 FINDINGS: Cardiovascular: Heart size is normal with small pericardial effusion. Central pulmonary vasculature is normal on  venous phase assessment, study not protocol for PE evaluation. Aortic caliber is normal. Mediastinum/Nodes: No axillary adenopathy. Thoracic inlet structures are normal. Lungs/Pleura: Moderate bilateral pleural effusions. Basilar airspace disease bilaterally. Effusion greatest on the RIGHT. Airspace disease greatest on the LEFT with component of volume loss and shift of mediastinal structures from RIGHT to LEFT into the LEFT chest. Upper Abdomen: Incidental imaging of upper abdominal contents without acute process. Musculoskeletal: Chronic deformity of the sternum indicative of prior fracture. Evidence of spinal fusion at the upper portion of the lumbar spine, incompletely imaged on CT data. No acute bone finding. T11 compression with similar appearance. IMPRESSION: 1. Moderate bilateral pleural effusions with bibasilar airspace disease greatest on the LEFT with component of volume loss and shift of mediastinal structures from RIGHT to LEFT into the LEFT chest. Much of this appears to represent volume loss though there is some areas of variable enhancement that raise the question of concomitant pneumonia particularly at the LEFT lung base. Loss of air bronchograms raising the question of aspiration though there is no material seen in the trachea or larger airways. 2. Small pericardial effusion. 3. Chronic deformity of the sternum indicative of prior fracture. 4. T11 compression with similar appearance. 5. Aortic atherosclerosis. Aortic Atherosclerosis (ICD10-I70.0). Electronically Signed   By: Zetta Bills M.D.   On: 05/21/2020 11:06   DG Chest [facility]  Result Date: 05/21/2020 CLINICAL DATA:  Status post right-sided thoracentesis. EXAM: PORTABLE CHEST 1 VIEW COMPARISON:  May 21, 2020 (4:34 a.m.) FINDINGS: The study is limited secondary to limited patient positioning. The lungs are hyperinflated. Mild to moderate severity diffuse chronic appearing increased interstitial lung markings are seen. There is  opacification of the lower half of the left lung. No pneumothorax is identified. The cardiac silhouette is borderline in size. Multiple chronic left-sided rib fractures are seen. Degenerative changes seen throughout the thoracic spine. IMPRESSION: 1. Opacification of the lower half of the left lung consistent with moderate-sized pleural effusion and associated atelectasis versus infiltrate. 2. Underlying chronic interstitial lung disease. Electronically Signed   By: Virgina Norfolk M.D.   On: 05/21/2020 16:58   US THORACENTESIS ASP PLEURAL SPACE W/IMG GUIDE  Result Date: 05/21/2020 CLINICAL DATA:  Respiratory failure and bilateral pleural effusions, right greater than left. EXAM: ULTRASOUND GUIDED RIGHT THORACENTESIS COMPARISON:  None. PROCEDURE: An ultrasound guided thoracentesis was thoroughly discussed with the patient and questions answered. The benefits, risks, alternatives and complications were also discussed. The patient understands and wishes to proceed with the procedure. Written consent was obtained. Ultrasound was performed to localize and mark an adequate pocket of fluid in the right chest. The area was then prepped and draped in the normal sterile fashion. 1% Lidocaine was used for local anesthesia. Under ultrasound guidance a 6 French Safe-T-Centesis catheter was introduced. Thoracentesis was performed. The catheter was removed and a dressing applied. COMPLICATIONS: None FINDINGS: A total of approximately 550 mL of clear, yellow fluid was removed. A fluid sample was sent for laboratory analysis. IMPRESSION: Successful ultrasound guided right thoracentesis yielding 550 mL of pleural fluid. Electronically Signed   By: Aletta Edouard M.D.   On: 05/21/2020 17:00     Scheduled Meds: . baclofen  20 mg Oral TID  . diclofenac sodium  4 g Topical QID  . escitalopram  20 mg Oral Daily  . famotidine  20 mg Oral BID  . feeding supplement (ENSURE ENLIVE)  237 mL Oral BID BM  . fluticasone  1 spray  Each Nare Daily  . furosemide  20 mg Intravenous Q12H  . gabapentin  600 mg Oral QID  . guaiFENesin  600 mg Oral BID  . hydroxyurea  500 mg Oral Q MTWThF  . ipratropium-albuterol  3 mL Nebulization BID  . loratadine  10 mg Oral Daily  . magnesium oxide  400 mg Oral BID AC & HS  . midodrine  5 mg Oral TID WC  . multivitamin with minerals  1 tablet Oral Daily  . oxyCODONE  5 mg Oral Q6H  . rOPINIRole  2 mg Oral QID  . sodium chloride flush  3 mL Intravenous Q12H  . tiZANidine  4 mg Oral TID  . traZODone  75 mg Oral QHS   Continuous Infusions: . sodium chloride Stopped (05/22/20 2153)  . albumin human 12.5 g (05/23/20 0918)  . ceFEPime (MAXIPIME) IV 2 g (05/23/20 0941)     LOS: 4 days   Time Spent in minutes   30 minutes  Vonzella Althaus D.O. on 05/23/2020 at 10:28 AM  Between 7am to 7pm - Please see pager noted on [email or web address]  After 7pm go to [email or web address]  And look for the night coverage person covering for me after hours  [facility] Office  [phone]

## 2020-05-23 NOTE — Progress Notes (Signed)
Pt had 2 watery stool. Notify NP [place]. Will continue to monitor.

## 2020-05-23 NOTE — Plan of Care (Signed)
  Problem: Clinical Measurements: Goal: Will remain free from infection Outcome: Progressing   Problem: Safety: Goal: Ability to remain free from injury will improve Outcome: Progressing   

## 2020-05-23 NOTE — Progress Notes (Signed)
Pulmonary Medicine          Date: 05/23/2020,   MRN# [id number] Cynthia Price 10/14/1946     AdmissionWeight: 56.7 kg                 CurrentWeight: 51.8 kg   Referring physician: Dr Ree Kida   CHIEF COMPLAINT:   Loculated pleural effusion concerning for empyema   SUBJECTIVE   Patient resting in bed. She asked me to rotate her due to right hip pain but did say her breathing is much improved.  Shes on 2L/min [place].  Have not made much progress with diuresis due to hypotension. Will increase midodrine and perform ACTH stim to rule out adrenal insufficiency. Routine CXR for interval changes on b/l Pl effusions.    PAST MEDICAL HISTORY   Past Medical History:  Diagnosis Date  . Anemia 2012   from labs at [facility]  . Back pain    s/p hemilaminectomy with h/o herniated disc at L4L5  . Blood clot in vein    behind left knee  . BP (high blood pressure) 03/08/2014   Last Assessment & Plan:  Blood pressure has been controlled without significant dizzyness or associated fatigue. Taking antihypertensives as directed without difficulty.    Marland Kitchen CAD (coronary artery disease) 10/18/2011  . Chronic diastolic heart failure ([place]) 09/07/2014   Last Assessment & Plan:  Edema and sob seemingly baseline   . Collagen vascular disease ([place])   . Depression    after husband died  . DVT (deep venous thrombosis) ([place]) 05/14/2012  . Essential thrombocytosis ([place])   . Hyperlipidemia   . 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[place]) 03/05/2015  . Hypotension 03/26/2013  . IBS (irritable bowel syndrome)   . Insomnia   . Lupus anticoagulant positive   . Macular degeneration   . MS (multiple sclerosis) ([place])   . Multiple sclerosis ([place]) 04/29/2008   Per neurology at [facility]    . NEUROPATHY 04/29/2008   Per Neurology at [facility]    . RLS (restless legs syndrome)   . Spinal stenosis    lumbar  . Urinary retention    with high post void residuals 2013, per [facility] u[facility]     SURGICAL HISTORY   Past Surgical  History:  Procedure Laterality Date  . ANTERIOR LUMBAR FUSION  2012  . APPENDECTOMY    . CHOLECYSTECTOMY    . CHOLECYSTECTOMY    . LUMBAR DISC SURGERY    . LUMBAR LAMINECTOMY    . TONSILLECTOMY       FAMILY HISTORY   Family History  Problem Relation Age of Onset  . Heart disease Mother   . Thyroid cancer Mother   . Ovarian cancer Mother   . Alcohol abuse Father   . Heart disease Father   . Diabetes Sister   . Diabetes Brother   . Multiple sclerosis Paternal Aunt   . Multiple sclerosis Cousin      SOCIAL HISTORY   Social History   Tobacco Use  . Smoking status: Never Smoker  . Smokeless tobacco: Never Used  Substance Use Topics  . Alcohol use: No  . Drug use: No     MEDICATIONS    Home Medication:    Current Medication:  Current Facility-Administered Medications:  .  0.9 %  sodium chloride infusion, , Intravenous, PRN, Cristal Ford, DO, Stopped at 05/22/20 2153 .  acetaminophen (TYLENOL) tablet 650 mg, 650 mg, Oral, Q6H PRN, 650 mg at 05/23/20 0629 **OR** acetaminophen (TYLENOL)  suppository 650 mg, 650 mg, Rectal, Q6H PRN, Oswald Hillock, RPH .  albumin human 25 % solution 12.5 g, 12.5 g, Intravenous, Daily, Ottie Glazier, MD, Last Rate: 60 mL/hr at 05/23/20 0918, 12.5 g at 05/23/20 0918 .  baclofen (LIORESAL) tablet 20 mg, 20 mg, Oral, TID, Mansy, Jan A, MD, 20 mg at 05/23/20 5009 .  ceFEPIme (MAXIPIME) 2 g in sodium chloride 0.9 % 100 mL IVPB, 2 g, Intravenous, Q12H, Mikhail, [place], DO, Last Rate: 200 mL/hr at 05/23/20 0941, 2 g at 05/23/20 0941 .  chlorpheniramine-HYDROcodone (TUSSIONEX) 10-8 MG/5ML suspension 5 mL, 5 mL, Oral, Q12H PRN, Mansy, Jan A, MD .  diclofenac sodium (VOLTAREN) 1 % transdermal gel 4 g, 4 g, Topical, QID, Mansy, Jan A, MD, 4 g at 05/23/20 0923 .  escitalopram (LEXAPRO) tablet 20 mg, 20 mg, Oral, Daily, Mansy, Jan A, MD, 20 mg at 05/23/20 3818 .  famotidine (PEPCID) tablet 20 mg, 20 mg, Oral, BID, Mansy, Jan A, MD, 20 mg at  05/23/20 0920 .  feeding supplement (ENSURE ENLIVE) (ENSURE ENLIVE) liquid 237 mL, 237 mL, Oral, BID BM, Cristal Ford, DO, 237 mL at 05/23/20 0923 .  fluticasone (FLONASE) 50 MCG/ACT nasal spray 1 spray, 1 spray, Each Nare, Daily, Mansy, Jan A, MD, 1 spray at 05/23/20 0921 .  furosemide (LASIX) injection 20 mg, 20 mg, Intravenous, Q12H, Ottie Glazier, MD, 20 mg at 05/23/20 0401 .  gabapentin (NEURONTIN) tablet 600 mg, 600 mg, Oral, QID, Mansy, Jan A, MD, 600 mg at 05/23/20 0923 .  guaiFENesin (MUCINEX) 12 hr tablet 600 mg, 600 mg, Oral, BID, Mansy, Jan A, MD, 600 mg at 05/23/20 2993 .  hydroxyurea (HYDREA) capsule 500 mg, 500 mg, Oral, Q MTWThF, Mansy, Jan A, MD, 500 mg at 05/22/20 0618 .  ipratropium-albuterol (DUONEB) 0.5-2.5 (3) MG/3ML nebulizer solution 3 mL, 3 mL, Nebulization, BID, Mansy, Jan A, MD, 3 mL at 05/23/20 0725 .  loratadine (CLARITIN) tablet 10 mg, 10 mg, Oral, Daily, Mansy, Jan A, MD, 10 mg at 05/23/20 7169 .  magnesium hydroxide (MILK OF MAGNESIA) suspension 30 mL, 30 mL, Oral, Daily PRN, Mansy, Jan A, MD .  magnesium oxide (MAG-OX) tablet 400 mg, 400 mg, Oral, BID AC & HS, Mansy, Jan A, MD, 400 mg at 05/23/20 0924 .  metoCLOPramide (REGLAN) tablet 5 mg, 5 mg, Oral, Q6H PRN, Mansy, Jan A, MD, 5 mg at 05/22/20 1106 .  midodrine (PROAMATINE) tablet 10 mg, 10 mg, Oral, TID WC, Minette Manders, MD .  multivitamin with minerals tablet 1 tablet, 1 tablet, Oral, Daily, Mansy, Jan A, MD, 1 tablet at 05/23/20 0920 .  ondansetron (ZOFRAN) tablet 4 mg, 4 mg, Oral, Q6H PRN **OR** ondansetron (ZOFRAN) injection 4 mg, 4 mg, Intravenous, Q6H PRN, Mansy, Jan A, MD, 4 mg at 05/23/20 6789 .  oxyCODONE (Oxy IR/ROXICODONE) immediate release tablet 5 mg, 5 mg, Oral, Q6H PRN, Mansy, Jan A, MD, 5 mg at 05/21/20 2139 .  oxyCODONE (Oxy IR/ROXICODONE) immediate release tablet 5 mg, 5 mg, Oral, Q6H, Mansy, Jan A, MD, 5 mg at 05/23/20 0401 .  rOPINIRole (REQUIP) tablet 2 mg, 2 mg, Oral, QID, Mansy, Jan  A, MD, 2 mg at 05/23/20 0920 .  sodium chloride (OCEAN) 0.65 % nasal spray 1 spray, 1 spray, Each Nare, PRN, Cristal Ford, DO, 1 spray at 05/22/20 0740 .  sodium chloride flush (NS) 0.9 % injection 3 mL, 3 mL, Intravenous, Q12H, Mikhail, [place], DO, 3 mL at 05/22/20 2058 .  tiZANidine (  ZANAFLEX) tablet 4 mg, 4 mg, Oral, TID, Mansy, Jan A, MD, 4 mg at 05/23/20 9562 .  traZODone (DESYREL) tablet 75 mg, 75 mg, Oral, QHS, Mansy, Jan A, MD, 75 mg at 05/22/20 2056    ALLERGIES   Atorvastatin     REVIEW OF SYSTEMS    Review of Systems:  Gen:  Denies  fever, sweats, chills weigh loss  HEENT: Denies blurred vision, double vision, ear pain, eye pain, hearing loss, nose bleeds, sore throat Cardiac:  No dizziness, chest pain or heaviness, chest tightness,edema Resp:   Denies cough or sputum porduction, shortness of breath,wheezing, hemoptysis,  Gi: Denies swallowing difficulty, stomach pain, nausea or vomiting, diarrhea, constipation, bowel incontinence Gu:  Denies bladder incontinence, burning urine Ext:   Denies Joint pain, stiffness or swelling Skin: Denies  skin rash, easy bruising or bleeding or hives Endoc:  Denies polyuria, polydipsia , polyphagia or weight change Psych:   Denies depression, insomnia or hallucinations   Other:  All other systems negative   VS: BP (!) 102/58 (BP Location: Right Arm)   Pulse 78   Temp 98.3 F (36.8 C) (Oral)   Resp 16   Ht 5\' 5"  (1.651 m)   Wt 51.8 kg   SpO2 94%   BMI 19.00 kg/m      PHYSICAL EXAM    GENERAL:NAD, no fevers, chills, no weakness no fatigue HEAD: Normocephalic, atraumatic.  EYES: Pupils equal, round, reactive to light. Extraocular muscles intact. No scleral icterus.  MOUTH: Moist mucosal membrane. Dentition intact. No abscess noted.  EAR, NOSE, THROAT: Clear without exudates. No external lesions.  NECK: Supple. No thyromegaly. No nodules. No JVD.  PULMONARY: Decreased breath sounds bilaterally CARDIOVASCULAR: S1  and S2. Regular rate and rhythm. No murmurs, rubs, or gallops. No edema. Pedal pulses 2+ bilaterally.  GASTROINTESTINAL: Soft, nontender, nondistended. No masses. Positive bowel sounds. No hepatosplenomegaly.  MUSCULOSKELETAL: No swelling, clubbing, or edema. Range of motion full in all extremities.  NEUROLOGIC: Cranial nerves II through XII are intact. No gross focal neurological deficits. Sensation intact. Reflexes intact.  SKIN: No ulceration, lesions, rashes, or cyanosis. Skin warm and dry. Turgor intact.  PSYCHIATRIC: Mood, affect within normal limits. The patient is awake, alert and oriented x 3. Insight, judgment intact.       IMAGING    DG Chest 1 View  Addendum Date: 05/21/2020   ADDENDUM REPORT: 05/21/2020 06:03 ADDENDUM: Additional attempts were made to reposition the patient. With diminished patient rotation and less obscuration of the left upper lung. These images reveal increasing now completely confluent opacity in the left lung base with air bronchograms likely reflecting a combination of consolidated lung, volume loss and layering effusion. Increasing opacities are present throughout the right mid to lower lung as well which could reflect further airspace disease or edema as well as small right effusion. This addendum will be called to the ordering clinician or representative by the Radiologist Assistant, and communication documented in the PACS or [employer]. Electronically Signed   By: Lovena Le M.D.   On: 05/21/2020 06:03   Result Date: 05/21/2020 CLINICAL DATA:  Shortness of breath EXAM: CHEST  1 VIEW COMPARISON:  Radiograph 05/19/2020, CT 06/14/2015 FINDINGS: Difficulty with patient positioning with the head projecting over the left mid to upper lung obscuring much of the visible portions of the left lung itself. Additionally there is a steep right anterior obliquity. Redemonstration of some patchy and streaky right basilar opacity. More globally increased opacity in  the retrocardiac space.  Obscuration of the hemidiaphragms is suggestive of developing pleural effusions. No visible right pneumothorax. Left apex obscured. Visible mediastinal contours are not significantly changed from prior though much of the left heart border is obscured. No acute osseous or soft tissue abnormality. Chronic rib deformities are again seen. Telemetry leads overlie the chest. IMPRESSION: 1. Persistent right basilar airspace opacities with developing right effusion. 2. Globally increased opacity in the retrocardiac space, could reflect developing pleural effusion, atelectasis or pneumonia. 3. Marked patient rotation as well patient's head obscuring the left mid to upper lung, limiting evaluation. Electronically Signed: By: Lovena Le M.D. On: 05/21/2020 05:13   CT CHEST W CONTRAST  Result Date: 05/21/2020 CLINICAL DATA:  Respiratory failure history of multiple sclerosis and DVT on Xarelto. EXAM: CT CHEST WITH CONTRAST TECHNIQUE: Multidetector CT imaging of the chest was performed during intravenous contrast administration. CONTRAST:  74mL OMNIPAQUE IOHEXOL 300 MG/ML  SOLN COMPARISON:  06/13/2015 FINDINGS: Cardiovascular: Heart size is normal with small pericardial effusion. Central pulmonary vasculature is normal on venous phase assessment, study not protocol for PE evaluation. Aortic caliber is normal. Mediastinum/Nodes: No axillary adenopathy. Thoracic inlet structures are normal. Lungs/Pleura: Moderate bilateral pleural effusions. Basilar airspace disease bilaterally. Effusion greatest on the RIGHT. Airspace disease greatest on the LEFT with component of volume loss and shift of mediastinal structures from RIGHT to LEFT into the LEFT chest. Upper Abdomen: Incidental imaging of upper abdominal contents without acute process. Musculoskeletal: Chronic deformity of the sternum indicative of prior fracture. Evidence of spinal fusion at the upper portion of the lumbar spine, incompletely imaged on  CT data. No acute bone finding. T11 compression with similar appearance. IMPRESSION: 1. Moderate bilateral pleural effusions with bibasilar airspace disease greatest on the LEFT with component of volume loss and shift of mediastinal structures from RIGHT to LEFT into the LEFT chest. Much of this appears to represent volume loss though there is some areas of variable enhancement that raise the question of concomitant pneumonia particularly at the LEFT lung base. Loss of air bronchograms raising the question of aspiration though there is no material seen in the trachea or larger airways. 2. Small pericardial effusion. 3. Chronic deformity of the sternum indicative of prior fracture. 4. T11 compression with similar appearance. 5. Aortic atherosclerosis. Aortic Atherosclerosis (ICD10-I70.0). Electronically Signed   By: Zetta Bills M.D.   On: 05/21/2020 11:06   DG Chest Port 1 View  Result Date: 05/21/2020 CLINICAL DATA:  Status post right-sided thoracentesis. EXAM: PORTABLE CHEST 1 VIEW COMPARISON:  May 21, 2020 (4:34 a.m.) FINDINGS: The study is limited secondary to limited patient positioning. The lungs are hyperinflated. Mild to moderate severity diffuse chronic appearing increased interstitial lung markings are seen. There is opacification of the lower half of the left lung. No pneumothorax is identified. The cardiac silhouette is borderline in size. Multiple chronic left-sided rib fractures are seen. Degenerative changes seen throughout the thoracic spine. IMPRESSION: 1. Opacification of the lower half of the left lung consistent with moderate-sized pleural effusion and associated atelectasis versus infiltrate. 2. Underlying chronic interstitial lung disease. Electronically Signed   By: Virgina Norfolk M.D.   On: 05/21/2020 16:58   DG Chest Portable 1 View  Result Date: 05/19/2020 CLINICAL DATA:  Sepsis.  Hypotension.  Fever. EXAM: PORTABLE CHEST 1 VIEW COMPARISON:  08/19/2017 FINDINGS: Patient's chin  obscures the apices. Significant patient rotation. Stable heart size and mediastinal contours. Streaky retrocardiac opacity. Patchy right lung base opacity. Possible small pleural effusions. No pneumothorax. No pulmonary edema.  Bones under mineralized with scoliotic curvature of the spine. IMPRESSION: 1. Streaky retrocardiac opacity and patchy right lung base opacity, may be atelectasis or pneumonia. Possible small pleural effusions. 2. Rotated exam. Electronically Signed   By: Keith Rake M.D.   On: 05/19/2020 01:01   US THORACENTESIS ASP PLEURAL SPACE W/IMG GUIDE  Result Date: 05/21/2020 CLINICAL DATA:  Respiratory failure and bilateral pleural effusions, right greater than left. EXAM: ULTRASOUND GUIDED RIGHT THORACENTESIS COMPARISON:  None. PROCEDURE: An ultrasound guided thoracentesis was thoroughly discussed with the patient and questions answered. The benefits, risks, alternatives and complications were also discussed. The patient understands and wishes to proceed with the procedure. Written consent was obtained. Ultrasound was performed to localize and mark an adequate pocket of fluid in the right chest. The area was then prepped and draped in the normal sterile fashion. 1% Lidocaine was used for local anesthesia. Under ultrasound guidance a 6 French Safe-T-Centesis catheter was introduced. Thoracentesis was performed. The catheter was removed and a dressing applied. COMPLICATIONS: None FINDINGS: A total of approximately 550 mL of clear, yellow fluid was removed. A fluid sample was sent for laboratory analysis. IMPRESSION: Successful ultrasound guided right thoracentesis yielding 550 mL of pleural fluid. Electronically Signed   By: Aletta Edouard M.D.   On: 05/21/2020 17:00         ASSESSMENT/PLAN   Acute hypoxemic respiratory failure likley due to bilateral compressive atelectasis  -present on admission   - patient with febrile illness and left lung infiltrate possible CAP   -procalcitonin with only mild elevation -will recollect - thoracentesis performed and patient improved with now only 1-2L/min [place] - Agree with empiric Cefepime / Zithromax   Bilateral pleural effusions - Reviewed fluid profile- lymphocyte predominant transudate suggestive of CHF vs MS related fluid -will attempt diuresis gently post improved BP -Thoracentesis studies are still peding -repeat CXR today   BiIateral compressive atelectasis -will drain fluid and diurese -recruitment manuevers with Metaneb therapy utilising saline  -albuterol nebulizer -patient unable to use IS/Flutter due to MS hx   Moderate protein calorie malnutrition   - hindering diuresis and may be cause of effusion   - will replete Albumin and consult RD nutritional consult    Circulatory shock-improved   - hindering ability to diurese- continue midodrine -present on admission - likely related to sepsis secondary to UTI vs CAP - resolved with now transient hypotension  -ACTH stim this weekend  -increasing midodrine to 10TID    Thank you for allowing me to participate in the care of this patient.   Patient/Family are satisfied with care plan and all questions have been answered.   This document was prepared using Dragon voice recognition software and may include unintentional dictation errors.     Ottie Glazier, M.D.  Division of [place]

## 2020-05-24 LAB — BASIC METABOLIC PANEL
Anion gap: 8 (ref 5–15)
BUN: 20 mg/dL (ref 8–23)
CO2: 29 mmol/L (ref 22–32)
Calcium: 8.5 mg/dL — ABNORMAL LOW (ref 8.9–10.3)
Chloride: 105 mmol/L (ref 98–111)
Creatinine, Ser: 0.66 mg/dL (ref 0.44–1.00)
GFR calc Af Amer: >60 mL/min (ref >60)
GFR calc non Af Amer: >60 mL/min (ref >60)
Glucose, Bld: 87 mg/dL (ref 70–99)
Potassium: 3.6 mmol/L (ref 3.5–5.1)
Sodium: 142 mmol/L (ref 135–145)

## 2020-05-24 LAB — CULTURE, BLOOD (ROUTINE X 2)
Culture: NO GROWTH
Culture: NO GROWTH
Special Requests: ADEQUATE

## 2020-05-24 LAB — CBC
HCT: 31.9 % — ABNORMAL LOW (ref 36.0–46.0)
Hemoglobin: 10.1 g/dL — ABNORMAL LOW (ref 12.0–15.0)
MCH: 30.7 pg (ref 26.0–34.0)
MCHC: 31.7 g/dL (ref 30.0–36.0)
MCV: 97 fL (ref 80.0–100.0)
Platelets: 378 10*3/uL (ref 150–400)
RBC: 3.29 MIL/uL — ABNORMAL LOW (ref 3.87–5.11)
RDW: 15.9 % — ABNORMAL HIGH (ref 11.5–15.5)
WBC: 12.3 10*3/uL — ABNORMAL HIGH (ref 4.0–10.5)
nRBC: 0 % (ref 0.0–0.2)

## 2020-05-24 LAB — ACTH STIMULATION, 3 TIME POINTS
Cortisol, 30 Min: 29.1 ug/dL
Cortisol, 60 Min: 35.4 ug/dL
Cortisol, Base: 13.6 ug/dL

## 2020-05-24 LAB — PROTEIN, BODY FLUID (OTHER): Total Protein, Body Fluid Other: 2 g/dL

## 2020-05-24 LAB — CORTISOL: Cortisol, Plasma: 11 ug/dL

## 2020-05-24 MED ORDER — SODIUM CHLORIDE 0.9 % IV BOLUS: 250.0000 mL | Freq: Once | INTRAVENOUS | Status: DC

## 2020-05-24 MED ORDER — HYDROCORTISONE NA SUCCINATE PF 100 MG IJ SOLR
50.0000 mg | Freq: Three times a day (TID) | INTRAMUSCULAR | Status: DC
  Administered 2020-05-24 – 2020-05-29 (×17): 50 mg via INTRAVENOUS
  Filled 2020-05-24 (×17): qty 1

## 2020-05-24 MED ORDER — SODIUM CHLORIDE 0.9 % IV BOLUS
250.0000 mL | Freq: Once | INTRAVENOUS | Status: AC
  Administered 2020-05-24: 250 mL via INTRAVENOUS

## 2020-05-24 NOTE — Progress Notes (Signed)
phlebotomist came and try to get the [facility] blood work for pt but was successful at this time. Phlebotomist states will try by another staff. Will continue to monitor.

## 2020-05-24 NOTE — Plan of Care (Signed)
Pt bed bound, limited movement; contractures to left limbs. Adequate po intake.  Pt became hypotensive; bolus fluid and steroid given with improvement. NSR on tele. Problem: Education: Goal: Knowledge of General Education information will improve Description: Including pain rating scale, medication(s)/side effects and non-pharmacologic comfort measures Outcome: Not Progressing   Problem: Health Behavior/Discharge Planning: Goal: Ability to manage health-related needs will improve Outcome: Not Progressing   Problem: Clinical Measurements: Goal: Ability to maintain clinical measurements within normal limits will improve Outcome: Not Progressing Goal: Diagnostic test results will improve Outcome: Not Progressing Goal: Respiratory complications will improve Outcome: Progressing   Problem: Activity: Goal: Risk for activity intolerance will decrease Outcome: Not Progressing   Problem: Nutrition: Goal: Adequate nutrition will be maintained Outcome: Progressing   Problem: Coping: Goal: Level of anxiety will decrease Outcome: Progressing   Problem: Skin Integrity: Goal: Risk for impaired skin integrity will decrease Outcome: Not Progressing

## 2020-05-24 NOTE — Progress Notes (Signed)
PROGRESS NOTE    Cynthia Price  RXV:[id number] DOB: 1946-10-25 DOA: 05/18/2020 PCP: [name], MD   Brief Narrative:  HPI on 05/19/2020 by Dr. Eugenie Norrie Brunella Wileman  is a 74 y.o. female with a known history of multiple sclerosis, DVT on Xarelto, depression, coronary artery disease, IBS, chronic anemia and chronic diastolic CHF who comes from her skilled nursing facility with acute onset of fever for the last couple of days for which she was given Tylenol and was noted to have hypotension that prompted 911 call.  Upon arrival EMS her blood pressure was 60/44 for which she was given IV fluid bolus and blood pressure was up to the 80s in the ER.  No history could be obtained from the patient due to her altered mental status and likely underlying dementia.  Interim history Patient admitted with sepsis secondary to UTI and possibly pneumonia, currently placed on IV antibiotics.  Also noted to have anemia, Xarelto held as FOBT was positive, GI consulted.  Overnight, patient became febrile with tachycardia and hypoxia, repeat chest x-ray was obtained.  Pulmonary consulted. S/p thoracentesis. Assessment & Plan   Sepsis secondary to UTI/pneumonia with pleural effusions -Patient presented with fever, hypotension, tachycardia -Overnight, patient became febrile along with tachycardia and hypoxia -Repeat chest x-ray on 05/21/2020: Reviewed and shows worsening of left-sided pleural effusion and pneumonia -UA: Trace leukocytes, negative nitrites, many bacteria, > 50 WBC -Urine culture shows multiple species, Recollection suggested -Blood culture showed no growth to date -Given fever, was placed on cefepime. Continue azithromycin (MRSA PCR was negative) -Pulmonology consulted and appreciated -CT chest: Moderate bilateral pleural effusions with bibasilar airspace disease greatest on the left with volume loss and shift of mediastinal structures from right to left into the left chest.  Small  pericardial effusion. -IR consulted and appreciated, status post ultrasound-guided right thoracentesis yielding 550 mL of fluid -Fluid culture shows no growth to date -Fluid appears transudative  Acute hypoxic respiratory failure -Likely multifactorial including pleural effusions and pneumonia -Continue supplemental oxygen to maintain saturations above 90% (patient has been weaned down to 1 to 2 L from 4 L previously) -s/p thoracentesis -Continue treatment plan as above   Acute on chronic macrocytic anemia/ GI Bleed -Hemoglobin had dropped to 6.6 -Baseline hemoglobin between 9 and 10 -Patient transfused 1 unit PRBC on 05/20/2020, hemoglobin currently 10.1 -Xarelto currently held -Anemia panel obtained, patient with adequate iron and iron storage.  TIBC 133, folate 16.8 -FOBT positive -Gastroenterology consulted and appreciated- and has signed off -Given that hemoglobin is currently stable, will consider restarting Xarelto during hospitalization   Hypotension -Patient was given IV albumin with IV fluids -BP has stabilized, continue to monitor closely -Continue midodrine -Blood pressures have been more stable and normotensive -ACTH stim test ordered and pending -AM cortisol WNL  ?  Aspiration/dysphagia -Speech therapy consulted, recommending dysphagia 1 diet with thin liquids  Multiple sclerosis -Continue baclofen  Depression -Continue Cymbalta  History of DVT -As above, Xarelto held  T11 compression -Noted on CT chest -Continue supportive care  Malnutrition -Nutrition consulted-continue supplements -Albumin 2.7  DVT Prophylaxis  SCDs  Code Status: DNR  Family Communication: None at bedside  Disposition Plan:  Status is: Inpatient  Remains inpatient appropriate because:Hemodynamically unstable, Ongoing diagnostic testing needed not appropriate for outpatient work up and IV treatments appropriate due to intensity of illness or inability to take PO   Dispo: The  patient is from: SNF  Anticipated d/c is to: SNF              Anticipated d/c date is: > 3 days              Patient currently is not medically stable to d/c.   Consultants Gastroenterology Pulmonology Interventional radiology  Procedures  Ultrasound guided right thoracentesis  Antibiotics   Anti-infectives (From admission, onward)   Start     Dose/Rate Route Frequency Ordered Stop   05/23/20 1700  azithromycin (ZITHROMAX) 500 mg in sodium chloride 0.9 % 250 mL IVPB     Discontinue     500 mg 250 mL/hr over 60 Minutes Intravenous Every 24 hours 05/23/20 1658     05/21/20 1200  ceFEPIme (MAXIPIME) 2 g in sodium chloride 0.9 % 100 mL IVPB     Discontinue     2 g 200 mL/hr over 30 Minutes Intravenous Every 12 hours 05/21/20 1121     05/21/20 1000  azithromycin (ZITHROMAX) tablet 500 mg        500 mg Oral  Once 05/20/20 0918 05/21/20 0913   05/19/20 0600  cefTRIAXone (ROCEPHIN) 2 g in sodium chloride 0.9 % 100 mL IVPB  Status:  Discontinued        2 g 200 mL/hr over 30 Minutes Intravenous Every 24 hours 05/19/20 0451 05/21/20 1121   05/19/20 0600  azithromycin (ZITHROMAX) 500 mg in sodium chloride 0.9 % 250 mL IVPB  Status:  Discontinued        500 mg 250 mL/hr over 60 Minutes Intravenous Every 24 hours 05/19/20 0451 05/20/20 0918   05/19/20 0045  ceFEPIme (MAXIPIME) 1 g in sodium chloride 0.9 % 100 mL IVPB        1 g 200 mL/hr over 30 Minutes Intravenous  Once 05/19/20 0036 05/19/20 0125   05/19/20 0045  vancomycin (VANCOCIN) IVPB 1000 mg/200 mL premix        1,000 mg 200 mL/hr over 60 Minutes Intravenous  Once 05/19/20 0036 05/19/20 0230      Subjective:   Cynthia Price seen and examined today.  History of dementia.  Continues to complain of headache.  Denies any further nasal congestion or abdominal pain.  Denies chest pain or shortness of breath at this time-feels her breathing has improved mildly.  Denies nausea or vomiting, dizziness.  Does complain of left hip  pain but feels it is due to her positioning.     Objective:   Vitals:   05/23/20 2012 05/24/20 0200 05/24/20 0504 05/24/20 0903  BP:  94/75 101/63 106/71  Pulse:   87 99  Resp:  16  20  Temp:  98.2 F (36.8 C) 98.6 F (37 C) 98.2 F (36.8 C)  TempSrc:  Oral Oral Oral  SpO2: 94% 94% 95% 92%  Weight:      Height:        Intake/Output Summary (Last 24 hours) at 05/24/2020 1033 Last data filed at 05/24/2020 0341 Gross per 24 hour  Intake 474.84 ml  Output 200 ml  Net 274.84 ml   Filed Weights   05/20/20 0348 05/21/20 0359 05/22/20 0442  Weight: 48.9 kg 50.7 kg 51.8 kg   Exam  General: Well developed, chronically ill-appearing, NAD  HEENT: NCAT, mucous membranes moist.   Cardiovascular: S1 S2 auscultated, RRR  Respiratory: Diminished breath sounds, no wheezing  Abdomen: Soft, nontender, nondistended, + bowel sounds  Extremities: warm dry without cyanosis clubbing or edema.  LUE edema improving.  Abrasion noted on right  ankle.  Neuro: AAOx2 (self, place) , dementia, nonfocal  Psych: Pleasant, appropriate mood and affect  Data Reviewed: I have personally reviewed following labs and imaging studies  CBC: Recent Labs  Lab 05/19/20 0009 05/19/20 0009 05/19/20 0503 05/19/20 1840 05/20/20 2007 05/21/20 0636 05/22/20 0554 05/23/20 0316 05/24/20 0534  WBC 10.4   < > 9.7  --   --  10.1 10.2 10.8* 12.3*  NEUTROABS 8.6*  --   --   --   --   --   --   --   --   HGB 8.3*   < > 7.2*   < > 9.3* 9.5* 9.7* 10.2* 10.1*  HCT 26.4*   < > 23.6*   < > 29.2* 29.6* 29.4* 31.9* 31.9*  MCV 101.9*   < > 103.1*  --   --  97.4 96.4 97.3 97.0  PLT 302   < > 260  --   --  344 340 366 378   < > = values in this interval not displayed.   Basic Metabolic Panel: Recent Labs  Lab 05/19/20 0503 05/21/20 0636 05/22/20 0554 05/23/20 0316 05/24/20 0534  NA 142 141 143 139 142  K 3.9 3.9 4.0 3.6 3.6  CL 105 108 110 104 105  CO2 30 26 24 25 29   GLUCOSE 128* 105* 84 98 87  BUN 21 11  15 18 20   CREATININE 0.66 0.64 0.65 0.66 0.66  CALCIUM 8.4* 8.1* 8.4* 8.4* 8.5*   GFR: Estimated Creatinine Clearance: 50.5 mL/min (by C-G formula based on SCr of 0.66 mg/dL). Liver Function Tests: Recent Labs  Lab 05/19/20 0009  AST 18  ALT 10  ALKPHOS 72  BILITOT 0.6  PROT 6.3*  ALBUMIN 2.7*   No results for input(s): LIPASE, AMYLASE in the last 168 hours. No results for input(s): AMMONIA in the last 168 hours. Coagulation Profile: Recent Labs  Lab 05/19/20 0503  INR 1.7*   Cardiac Enzymes: No results for input(s): CKTOTAL, CKMB, CKMBINDEX, TROPONINI in the last 168 hours. BNP (last 3 results) No results for input(s): PROBNP in the last 8760 hours. HbA1C: No results for input(s): HGBA1C in the last 72 hours. CBG: No results for input(s): GLUCAP in the last 168 hours. Lipid Profile: No results for input(s): CHOL, HDL, LDLCALC, TRIG, CHOLHDL, LDLDIRECT in the last 72 hours. Thyroid Function Tests: No results for input(s): TSH, T4TOTAL, FREET4, T3FREE, THYROIDAB in the last 72 hours. Anemia Panel: No results for input(s): VITAMINB12, FOLATE, FERRITIN, TIBC, IRON, RETICCTPCT in the last 72 hours. Urine analysis:    Component Value Date/Time   COLORURINE AMBER (A) 05/19/2020 0010   APPEARANCEUR TURBID (A) 05/19/2020 0010   APPEARANCEUR Clear 09/25/2015 1020   LABSPEC 1.016 05/19/2020 0010   LABSPEC 1.011 11/02/2014 1914   PHURINE 8.0 05/19/2020 0010   GLUCOSEU NEGATIVE 05/19/2020 0010   GLUCOSEU Negative 11/02/2014 1914   HGBUR NEGATIVE 05/19/2020 0010   HGBUR trace-intact 04/29/2010 0952   BILIRUBINUR NEGATIVE 05/19/2020 0010   BILIRUBINUR Negative 09/25/2015 1020   BILIRUBINUR Negative 11/02/2014 1914   KETONESUR 5 (A) 05/19/2020 0010   PROTEINUR 100 (A) 05/19/2020 0010   UROBILINOGEN negative 02/04/2014 1603   UROBILINOGEN 0.2 04/29/2010 0952   NITRITE NEGATIVE 05/19/2020 0010   LEUKOCYTESUR TRACE (A) 05/19/2020 0010   LEUKOCYTESUR Negative 11/02/2014 1914    Sepsis Labs: @LABRCNTIP (procalcitonin:4,lacticidven:4)  ) Recent Results (from the past 240 hour(s))  Blood culture (routine x 2)     Status: None   Collection Time:  05/19/20 12:10 AM   Specimen: Right Antecubital; Blood  Result Value Ref Range Status   Specimen Description RIGHT ANTECUBITAL  Final   Special Requests   Final    BOTTLES DRAWN AEROBIC AND ANAEROBIC Blood Culture results may not be optimal due to an excessive volume of blood received in culture bottles   Culture   Final    NO GROWTH 5 DAYS Performed at [facility], [address] [place], [place] [address]    Report Status 05/24/2020 FINAL  Final  Respiratory Panel by RT PCR (Flu A&B, Covid) - Nasopharyngeal Swab     Status: None   Collection Time: 05/19/20 12:10 AM   Specimen: Nasopharyngeal Swab  Result Value Ref Range Status   SARS Coronavirus 2 by RT PCR NEGATIVE NEGATIVE Final    Comment: (NOTE) SARS-CoV-2 target nucleic acids are NOT DETECTED.  The SARS-CoV-2 RNA is generally detectable in upper respiratoy specimens during the acute phase of infection. The lowest concentration of SARS-CoV-2 viral copies this assay can detect is 131 copies/mL. A negative result does not preclude SARS-Cov-2 infection and should not be used as the sole basis for treatment or other patient management decisions. A negative result may occur with  improper specimen collection/handling, submission of specimen other than nasopharyngeal swab, presence of viral mutation(s) within the areas targeted by this assay, and inadequate number of viral copies (<131 copies/mL). A negative result must be combined with clinical observations, patient history, and epidemiological information. The expected result is Negative.  Fact Sheet for Patients:  PinkCheek.be  Fact Sheet for Healthcare Providers:  GravelBags.it  This test is no t yet approved or cleared by the [place] FDA and  has been authorized for detection and/or diagnosis of SARS-CoV-2 by FDA under an Emergency Use Authorization (EUA). This EUA will remain  in effect (meaning this test can be used) for the duration of the COVID-19 declaration under Section 564(b)(1) of the Act, 21 U.S.C. section 360bbb-3(b)(1), unless the authorization is terminated or revoked sooner.     Influenza A by PCR NEGATIVE NEGATIVE Final   Influenza B by PCR NEGATIVE NEGATIVE Final    Comment: (NOTE) The Xpert Xpress SARS-CoV-2/FLU/RSV assay is intended as an aid in  the diagnosis of influenza from Nasopharyngeal swab specimens and  should not be used as a sole basis for treatment. Nasal washings and  aspirates are unacceptable for Xpert Xpress SARS-CoV-2/FLU/RSV  testing.  Fact Sheet for Patients: PinkCheek.be  Fact Sheet for Healthcare Providers: GravelBags.it  This test is not yet approved or cleared by the [place] FDA and  has been authorized for detection and/or diagnosis of SARS-CoV-2 by  FDA under an Emergency Use Authorization (EUA). This EUA will remain  in effect (meaning this test can be used) for the duration of the  Covid-19 declaration under Section 564(b)(1) of the Act, 21  U.S.C. section 360bbb-3(b)(1), unless the authorization is  terminated or revoked. Performed at [facility], [address] [place], [place] [address]   Urine culture     Status: Abnormal   Collection Time: 05/19/20 12:11 AM   Specimen: Urine, Clean Catch  Result Value Ref Range Status   Specimen Description   Final    URINE, CLEAN CATCH Performed at [facility], [address], [place], [place] [address]    Special Requests   Final    NONE Performed at [facility], [address], [place], [place] [address]    Culture MULTIPLE SPECIES  PRESENT, SUGGEST RECOLLECTION (A)  Final   Report Status 05/20/2020 FINAL  Final    Blood culture (routine x 2)     Status: None   Collection Time: 05/19/20 12:27 AM   Specimen: BLOOD RIGHT HAND  Result Value Ref Range Status   Specimen Description BLOOD RIGHT HAND  Final   Special Requests   Final    BOTTLES DRAWN AEROBIC AND ANAEROBIC Blood Culture adequate volume   Culture   Final    NO GROWTH 5 DAYS Performed at [facility], [address] [place], [place] [address]    Report Status 05/24/2020 FINAL  Final  MRSA PCR Screening     Status: None   Collection Time: 05/19/20  6:31 PM   Specimen: Nasopharyngeal  Result Value Ref Range Status   MRSA by PCR NEGATIVE NEGATIVE Final    Comment:        The GeneXpert MRSA Assay (FDA approved for NASAL specimens only), is one component of a comprehensive MRSA colonization surveillance program. It is not intended to diagnose MRSA infection nor to guide or monitor treatment for MRSA infections. Performed at [facility], [address] [place], [place] [address]   Body fluid culture     Status: None (Preliminary result)   Collection Time: 05/21/20  4:38 PM   Specimen: PATH Cytology Pleural fluid  Result Value Ref Range Status   Specimen Description   Final    PLEURAL Performed at [facility], [address] [place], [place] [address]    Special Requests NONE  Final   Gram Stain   Final    FEW WBC PRESENT, PREDOMINANTLY MONONUCLEAR NO ORGANISMS SEEN    Culture   Final    NO GROWTH 3 DAYS Performed at [facility], [place] [address], [place], [place] [address]    Report Status PENDING  Incomplete  Acid Fast Smear (AFB)     Status: None   Collection Time: 05/21/20  4:38 PM   Specimen: PATH Cytology Pleural fluid  Result Value Ref Range Status   AFB Specimen Processing Concentration  Final   Acid Fast Smear Negative  Final    Comment: (NOTE) Performed At: [facility] [place], [place] [id number] Rush Farmer MD [id number]    Source (AFB)  PLEURAL  Final    Comment: Performed at [facility], [address] [place], [place] [address]      Radiology Studies: DG Chest Port 1 View  Result Date: 05/23/2020 CLINICAL DATA:  Per chart Patient admitted with sepsis secondary to UTI and possibly pneumonia. Hx of high blood pressure, chronic heart failure, hypotension. Non smoker. EXAM: PORTABLE CHEST 1 VIEW COMPARISON:  05/21/2020 FINDINGS: Left lung base opacity obscures the hemidiaphragm and left heart border, associated with air bronchograms. There is bilateral vascular congestion and interstitial thickening. Hazy opacity is noted at the right lung base, which appears increased from the previous exam. Cardiac silhouette is normal in size. No mediastinal or hilar masses. Small bilateral effusions, better appreciated on the CT from 05/21/2020. No pneumothorax. IMPRESSION: 1. Mild increase in hazy opacity at the right lung base compared to the most recent prior exam, which may reflect an increase in atelectasis, pneumonia or a combination. 2. No other change. 3. Persistent consolidation in the left lung base, which may reflect pneumonia or atelectasis. Bilateral vascular congestion and interstitial prominence, also unchanged, without overt pulmonary edema. Electronically Signed   By: Dedra Skeens.D.  On: 05/23/2020 15:42     Scheduled Meds: . baclofen  20 mg Oral TID  . diclofenac sodium  4 g Topical QID  . escitalopram  20 mg Oral Daily  . famotidine  20 mg Oral BID  . feeding supplement (ENSURE ENLIVE)  237 mL Oral BID BM  . fluticasone  1 spray Each Nare Daily  . gabapentin  600 mg Oral QID  . guaiFENesin  600 mg Oral BID  . hydroxyurea  500 mg Oral Q MTWThF  . ipratropium-albuterol  3 mL Nebulization BID  . loratadine  10 mg Oral Daily  . magnesium oxide  400 mg Oral BID AC & HS  . midodrine  10 mg Oral TID WC  . multivitamin with minerals  1 tablet Oral Daily  . oxyCODONE  5 mg Oral Q6H  . rOPINIRole  2 mg Oral  QID  . sodium chloride flush  3 mL Intravenous Q12H  . tiZANidine  4 mg Oral TID  . traZODone  75 mg Oral QHS   Continuous Infusions: . sodium chloride Stopped (05/23/20 2306)  . albumin human 12.5 g (05/24/20 0918)  . azithromycin Stopped (05/23/20 2248)  . ceFEPime (MAXIPIME) IV Stopped (05/23/20 2139)     LOS: 5 days   Time Spent in minutes   30 minutes  Kuulei Kleier D.O. on 05/24/2020 at 10:33 AM  Between 7am to 7pm - Please see pager noted on [email or web address]  After 7pm go to [email or web address]  And look for the night coverage person covering for me after hours  [facility] Office  [phone]

## 2020-05-24 NOTE — Plan of Care (Signed)
  Problem: Pain Managment: Goal: General experience of comfort will improve Outcome: Progressing   Problem: Safety: Goal: Ability to remain free from injury will improve Outcome: Progressing   Problem: Bowel/Gastric: Goal: Will show no signs and symptoms of gastrointestinal bleeding Outcome: Progressing   Problem: Clinical Measurements: Goal: Respiratory complications will improve Outcome: Progressing

## 2020-05-24 NOTE — Progress Notes (Signed)
Pulmonary Medicine          Date: 05/24/2020,   MRN# [id number] Cynthia Price Feb 07, 1946     AdmissionWeight: 56.7 kg                 CurrentWeight: 51.8 kg   Referring physician: Dr Ree Kida   CHIEF COMPLAINT:   Loculated pleural effusion concerning for empyema   SUBJECTIVE   Patient resting in bed.  She is on only 1-2L/min [place]. Presentation is strange since her pleural fluid is transudative but she has signs of sepsis with hypothermia/hypotension as well as abnormal UA and CXR suggestive of PNA vs UTI.  Baseline cortisol is low but ACTH stimulation test with appropriate response.  Will continue current scope of care since patient is improving.     PAST MEDICAL HISTORY   Past Medical History:  Diagnosis Date  . Anemia 2012   from labs at [facility]  . Back pain    s/p hemilaminectomy with h/o herniated disc at L4L5  . Blood clot in vein    behind left knee  . BP (high blood pressure) 03/08/2014   Last Assessment & Plan:  Blood pressure has been controlled without significant dizzyness or associated fatigue. Taking antihypertensives as directed without difficulty.    Marland Kitchen CAD (coronary artery disease) 10/18/2011  . Chronic diastolic heart failure ([place]) 09/07/2014   Last Assessment & Plan:  Edema and sob seemingly baseline   . Collagen vascular disease ([place])   . Depression    after husband died  . DVT (deep venous thrombosis) ([place]) 05/14/2012  . Essential thrombocytosis ([place])   . Hyperlipidemia   . 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[place]) 03/05/2015  . Hypotension 03/26/2013  . IBS (irritable bowel syndrome)   . Insomnia   . Lupus anticoagulant positive   . Macular degeneration   . MS (multiple sclerosis) ([place])   . Multiple sclerosis ([place]) 04/29/2008   Per neurology at [facility]    . NEUROPATHY 04/29/2008   Per Neurology at [facility]    . RLS (restless legs syndrome)   . Spinal stenosis    lumbar  . Urinary retention    with high post void residuals 2013, per [facility] uro       SURGICAL HISTORY   Past Surgical History:  Procedure Laterality Date  . ANTERIOR LUMBAR FUSION  2012  . APPENDECTOMY    . CHOLECYSTECTOMY    . CHOLECYSTECTOMY    . LUMBAR DISC SURGERY    . LUMBAR LAMINECTOMY    . TONSILLECTOMY       FAMILY HISTORY   Family History  Problem Relation Age of Onset  . Heart disease Mother   . Thyroid cancer Mother   . Ovarian cancer Mother   . Alcohol abuse Father   . Heart disease Father   . Diabetes Sister   . Diabetes Brother   . Multiple sclerosis Paternal Aunt   . Multiple sclerosis Cousin      SOCIAL HISTORY   Social History   Tobacco Use  . Smoking status: Never Smoker  . Smokeless tobacco: Never Used  Substance Use Topics  . Alcohol use: No  . Drug use: No     MEDICATIONS    Home Medication:    Current Medication:  Current Facility-Administered Medications:  .  0.9 %  sodium chloride infusion, , Intravenous, PRN, Cristal Ford, DO, Stopped at 05/23/20 2306 .  acetaminophen (TYLENOL) tablet 650 mg, 650 mg, Oral, Q6H PRN, 650 mg at  05/23/20 1419 **OR** acetaminophen (TYLENOL) suppository 650 mg, 650 mg, Rectal, Q6H PRN, Oswald Hillock, RPH .  albumin human 25 % solution 12.5 g, 12.5 g, Intravenous, Daily, Seanpaul Preece, MD, Last Rate: 60 mL/hr at 05/24/20 0918, 12.5 g at 05/24/20 0918 .  azithromycin (ZITHROMAX) 500 mg in sodium chloride 0.9 % 250 mL IVPB, 500 mg, Intravenous, Q24H, Mikhail, [place], DO, Last Rate: 250 mL/hr at 05/24/20 1725, 500 mg at 05/24/20 1725 .  baclofen (LIORESAL) tablet 20 mg, 20 mg, Oral, TID, Mansy, Jan A, MD, 20 mg at 05/24/20 1727 .  ceFEPIme (MAXIPIME) 2 g in sodium chloride 0.9 % 100 mL IVPB, 2 g, Intravenous, Q12H, Mikhail, [place], DO, Last Rate: 200 mL/hr at 05/24/20 1128, 2 g at 05/24/20 1128 .  chlorpheniramine-HYDROcodone (TUSSIONEX) 10-8 MG/5ML suspension 5 mL, 5 mL, Oral, Q12H PRN, Mansy, Jan A, MD .  diclofenac sodium (VOLTAREN) 1 % transdermal gel 4 g, 4 g, Topical,  QID, Mansy, Jan A, MD, 4 g at 05/24/20 1723 .  escitalopram (LEXAPRO) tablet 20 mg, 20 mg, Oral, Daily, Mansy, Jan A, MD, 20 mg at 05/24/20 0911 .  famotidine (PEPCID) tablet 20 mg, 20 mg, Oral, BID, Mansy, Jan A, MD, 20 mg at 05/24/20 0914 .  feeding supplement (ENSURE ENLIVE) (ENSURE ENLIVE) liquid 237 mL, 237 mL, Oral, BID BM, Mikhail, Maryann, DO, 237 mL at 05/24/20 1127 .  fluticasone (FLONASE) 50 MCG/ACT nasal spray 1 spray, 1 spray, Each Nare, Daily, Mansy, Jan A, MD, 1 spray at 05/24/20 0914 .  gabapentin (NEURONTIN) tablet 600 mg, 600 mg, Oral, QID, Mansy, Jan A, MD, 600 mg at 05/24/20 1723 .  guaiFENesin (MUCINEX) 12 hr tablet 600 mg, 600 mg, Oral, BID, Mansy, Jan A, MD, 600 mg at 05/24/20 0913 .  hydrocortisone sodium succinate (SOLU-CORTEF) 100 MG injection 50 mg, 50 mg, Intravenous, Q8H, Mikhail, Maryann, DO, 50 mg at 05/24/20 1348 .  hydroxyurea (HYDREA) capsule 500 mg, 500 mg, Oral, Q MTWThF, Mansy, Jan A, MD, 500 mg at 05/22/20 0618 .  ipratropium-albuterol (DUONEB) 0.5-2.5 (3) MG/3ML nebulizer solution 3 mL, 3 mL, Nebulization, BID, Mansy, Jan A, MD, 3 mL at 05/24/20 1933 .  loratadine (CLARITIN) tablet 10 mg, 10 mg, Oral, Daily, Mansy, Jan A, MD, 10 mg at 05/24/20 0913 .  magnesium hydroxide (MILK OF MAGNESIA) suspension 30 mL, 30 mL, Oral, Daily PRN, Mansy, Jan A, MD .  magnesium oxide (MAG-OX) tablet 400 mg, 400 mg, Oral, BID AC & HS, Mansy, Jan A, MD, 400 mg at 05/24/20 1127 .  metoCLOPramide (REGLAN) tablet 5 mg, 5 mg, Oral, Q6H PRN, Mansy, Jan A, MD, 5 mg at 05/23/20 1551 .  midodrine (PROAMATINE) tablet 10 mg, 10 mg, Oral, TID WC, Quavion Boule, MD, 10 mg at 05/24/20 1723 .  multivitamin with minerals tablet 1 tablet, 1 tablet, Oral, Daily, Mansy, Jan A, MD, 1 tablet at 05/24/20 0912 .  ondansetron (ZOFRAN) tablet 4 mg, 4 mg, Oral, Q6H PRN **OR** ondansetron (ZOFRAN) injection 4 mg, 4 mg, Intravenous, Q6H PRN, Mansy, Jan A, MD, 4 mg at 05/23/20 2229 .  oxyCODONE (Oxy  IR/ROXICODONE) immediate release tablet 5 mg, 5 mg, Oral, Q6H PRN, Mansy, Jan A, MD, 5 mg at 05/21/20 2139 .  oxyCODONE (Oxy IR/ROXICODONE) immediate release tablet 5 mg, 5 mg, Oral, Q6H, Mansy, Jan A, MD, 5 mg at 05/24/20 1730 .  rOPINIRole (REQUIP) tablet 2 mg, 2 mg, Oral, QID, Mansy, Jan A, MD, 2 mg at 05/24/20 1723 .  sodium  chloride (OCEAN) 0.65 % nasal spray 1 spray, 1 spray, Each Nare, PRN, Cristal Ford, DO, 1 spray at 05/23/20 2124 .  sodium chloride 0.9 % bolus 250 mL, 250 mL, Intravenous, Once, Cristal Ford, DO .  sodium chloride flush (NS) 0.9 % injection 3 mL, 3 mL, Intravenous, Q12H, Mikhail, [place], DO, 3 mL at 05/24/20 0915 .  tiZANidine (ZANAFLEX) tablet 4 mg, 4 mg, Oral, TID, Mansy, Jan A, MD, 4 mg at 05/24/20 1723 .  traZODone (DESYREL) tablet 75 mg, 75 mg, Oral, QHS, Mansy, Jan A, MD, 75 mg at 05/23/20 2112    ALLERGIES   Atorvastatin     REVIEW OF SYSTEMS    Review of Systems:  Gen:  Denies  fever, sweats, chills weigh loss  HEENT: Denies blurred vision, double vision, ear pain, eye pain, hearing loss, nose bleeds, sore throat Cardiac:  No dizziness, chest pain or heaviness, chest tightness,edema Resp:   Denies cough or sputum porduction, shortness of breath,wheezing, hemoptysis,  Gi: Denies swallowing difficulty, stomach pain, nausea or vomiting, diarrhea, constipation, bowel incontinence Gu:  Denies bladder incontinence, burning urine Ext:   Denies Joint pain, stiffness or swelling Skin: Denies  skin rash, easy bruising or bleeding or hives Endoc:  Denies polyuria, polydipsia , polyphagia or weight change Psych:   Denies depression, insomnia or hallucinations   Other:  All other systems negative   VS: BP 96/66 (BP Location: Left Arm)   Pulse 79   Temp 99.6 F (37.6 C) (Oral)   Resp 18   Ht 5\' 5"  (1.651 m)   Wt 51.8 kg   SpO2 95%   BMI 19.00 kg/m      PHYSICAL EXAM    GENERAL:NAD, no fevers, chills, no weakness no fatigue HEAD:  Normocephalic, atraumatic.  EYES: Pupils equal, round, reactive to light. Extraocular muscles intact. No scleral icterus.  MOUTH: Moist mucosal membrane. Dentition intact. No abscess noted.  EAR, NOSE, THROAT: Clear without exudates. No external lesions.  NECK: Supple. No thyromegaly. No nodules. No JVD.  PULMONARY: Decreased breath sounds bilaterally improved CARDIOVASCULAR: S1 and S2. Regular rate and rhythm. No murmurs, rubs, or gallops. No edema. Pedal pulses 2+ bilaterally.  GASTROINTESTINAL: Soft, nontender, nondistended. No masses. Positive bowel sounds. No hepatosplenomegaly.  MUSCULOSKELETAL: No swelling, clubbing, or edema. Range of motion full in all extremities.  NEUROLOGIC: Cranial nerves II through XII are intact. No gross focal neurological deficits. Sensation intact. Reflexes intact.  SKIN: No ulceration, lesions, rashes, or cyanosis. Skin warm and dry. Turgor intact.  PSYCHIATRIC: Mood, affect within normal limits. The patient is awake, alert and oriented x 3. Insight, judgment intact.       IMAGING    DG Chest 1 View  Addendum Date: 05/21/2020   ADDENDUM REPORT: 05/21/2020 06:03 ADDENDUM: Additional attempts were made to reposition the patient. With diminished patient rotation and less obscuration of the left upper lung. These images reveal increasing now completely confluent opacity in the left lung base with air bronchograms likely reflecting a combination of consolidated lung, volume loss and layering effusion. Increasing opacities are present throughout the right mid to lower lung as well which could reflect further airspace disease or edema as well as small right effusion. This addendum will be called to the ordering clinician or representative by the Radiologist Assistant, and communication documented in the PACS or [employer]. Electronically Signed   By: Lovena Le M.D.   On: 05/21/2020 06:03   Result Date: 05/21/2020 CLINICAL DATA:  Shortness of breath EXAM:  CHEST  1 VIEW COMPARISON:  Radiograph 05/19/2020, CT 06/14/2015 FINDINGS: Difficulty with patient positioning with the head projecting over the left mid to upper lung obscuring much of the visible portions of the left lung itself. Additionally there is a steep right anterior obliquity. Redemonstration of some patchy and streaky right basilar opacity. More globally increased opacity in the retrocardiac space. Obscuration of the hemidiaphragms is suggestive of developing pleural effusions. No visible right pneumothorax. Left apex obscured. Visible mediastinal contours are not significantly changed from prior though much of the left heart border is obscured. No acute osseous or soft tissue abnormality. Chronic rib deformities are again seen. Telemetry leads overlie the chest. IMPRESSION: 1. Persistent right basilar airspace opacities with developing right effusion. 2. Globally increased opacity in the retrocardiac space, could reflect developing pleural effusion, atelectasis or pneumonia. 3. Marked patient rotation as well patient's head obscuring the left mid to upper lung, limiting evaluation. Electronically Signed: By: Lovena Le M.D. On: 05/21/2020 05:13   CT CHEST W CONTRAST  Result Date: 05/21/2020 CLINICAL DATA:  Respiratory failure history of multiple sclerosis and DVT on Xarelto. EXAM: CT CHEST WITH CONTRAST TECHNIQUE: Multidetector CT imaging of the chest was performed during intravenous contrast administration. CONTRAST:  47mL OMNIPAQUE IOHEXOL 300 MG/ML  SOLN COMPARISON:  06/13/2015 FINDINGS: Cardiovascular: Heart size is normal with small pericardial effusion. Central pulmonary vasculature is normal on venous phase assessment, study not protocol for PE evaluation. Aortic caliber is normal. Mediastinum/Nodes: No axillary adenopathy. Thoracic inlet structures are normal. Lungs/Pleura: Moderate bilateral pleural effusions. Basilar airspace disease bilaterally. Effusion greatest on the RIGHT. Airspace  disease greatest on the LEFT with component of volume loss and shift of mediastinal structures from RIGHT to LEFT into the LEFT chest. Upper Abdomen: Incidental imaging of upper abdominal contents without acute process. Musculoskeletal: Chronic deformity of the sternum indicative of prior fracture. Evidence of spinal fusion at the upper portion of the lumbar spine, incompletely imaged on CT data. No acute bone finding. T11 compression with similar appearance. IMPRESSION: 1. Moderate bilateral pleural effusions with bibasilar airspace disease greatest on the LEFT with component of volume loss and shift of mediastinal structures from RIGHT to LEFT into the LEFT chest. Much of this appears to represent volume loss though there is some areas of variable enhancement that raise the question of concomitant pneumonia particularly at the LEFT lung base. Loss of air bronchograms raising the question of aspiration though there is no material seen in the trachea or larger airways. 2. Small pericardial effusion. 3. Chronic deformity of the sternum indicative of prior fracture. 4. T11 compression with similar appearance. 5. Aortic atherosclerosis. Aortic Atherosclerosis (ICD10-I70.0). Electronically Signed   By: Zetta Bills M.D.   On: 05/21/2020 11:06   DG Chest Port 1 View  Result Date: 05/23/2020 CLINICAL DATA:  Per chart Patient admitted with sepsis secondary to UTI and possibly pneumonia. Hx of high blood pressure, chronic heart failure, hypotension. Non smoker. EXAM: PORTABLE CHEST 1 VIEW COMPARISON:  05/21/2020 FINDINGS: Left lung base opacity obscures the hemidiaphragm and left heart border, associated with air bronchograms. There is bilateral vascular congestion and interstitial thickening. Hazy opacity is noted at the right lung base, which appears increased from the previous exam. Cardiac silhouette is normal in size. No mediastinal or hilar masses. Small bilateral effusions, better appreciated on the CT from  05/21/2020. No pneumothorax. IMPRESSION: 1. Mild increase in hazy opacity at the right lung base compared to the most recent prior exam, which may reflect an  increase in atelectasis, pneumonia or a combination. 2. No other change. 3. Persistent consolidation in the left lung base, which may reflect pneumonia or atelectasis. Bilateral vascular congestion and interstitial prominence, also unchanged, without overt pulmonary edema. Electronically Signed   By: Lajean Manes M.D.   On: 05/23/2020 15:42   DG Chest Port 1 View  Result Date: 05/21/2020 CLINICAL DATA:  Status post right-sided thoracentesis. EXAM: PORTABLE CHEST 1 VIEW COMPARISON:  May 21, 2020 (4:34 a.m.) FINDINGS: The study is limited secondary to limited patient positioning. The lungs are hyperinflated. Mild to moderate severity diffuse chronic appearing increased interstitial lung markings are seen. There is opacification of the lower half of the left lung. No pneumothorax is identified. The cardiac silhouette is borderline in size. Multiple chronic left-sided rib fractures are seen. Degenerative changes seen throughout the thoracic spine. IMPRESSION: 1. Opacification of the lower half of the left lung consistent with moderate-sized pleural effusion and associated atelectasis versus infiltrate. 2. Underlying chronic interstitial lung disease. Electronically Signed   By: Virgina Norfolk M.D.   On: 05/21/2020 16:58   DG Chest Portable 1 View  Result Date: 05/19/2020 CLINICAL DATA:  Sepsis.  Hypotension.  Fever. EXAM: PORTABLE CHEST 1 VIEW COMPARISON:  08/19/2017 FINDINGS: Patient's chin obscures the apices. Significant patient rotation. Stable heart size and mediastinal contours. Streaky retrocardiac opacity. Patchy right lung base opacity. Possible small pleural effusions. No pneumothorax. No pulmonary edema. Bones under mineralized with scoliotic curvature of the spine. IMPRESSION: 1. Streaky retrocardiac opacity and patchy right lung base  opacity, may be atelectasis or pneumonia. Possible small pleural effusions. 2. Rotated exam. Electronically Signed   By: Keith Rake M.D.   On: 05/19/2020 01:01   US THORACENTESIS ASP PLEURAL SPACE W/IMG GUIDE  Result Date: 05/21/2020 CLINICAL DATA:  Respiratory failure and bilateral pleural effusions, right greater than left. EXAM: ULTRASOUND GUIDED RIGHT THORACENTESIS COMPARISON:  None. PROCEDURE: An ultrasound guided thoracentesis was thoroughly discussed with the patient and questions answered. The benefits, risks, alternatives and complications were also discussed. The patient understands and wishes to proceed with the procedure. Written consent was obtained. Ultrasound was performed to localize and mark an adequate pocket of fluid in the right chest. The area was then prepped and draped in the normal sterile fashion. 1% Lidocaine was used for local anesthesia. Under ultrasound guidance a 6 French Safe-T-Centesis catheter was introduced. Thoracentesis was performed. The catheter was removed and a dressing applied. COMPLICATIONS: None FINDINGS: A total of approximately 550 mL of clear, yellow fluid was removed. A fluid sample was sent for laboratory analysis. IMPRESSION: Successful ultrasound guided right thoracentesis yielding 550 mL of pleural fluid. Electronically Signed   By: Aletta Edouard M.D.   On: 05/21/2020 17:00         ASSESSMENT/PLAN   Acute hypoxemic respiratory failure likley due to bilateral compressive atelectasis  -present on admission   - patient with febrile illness and left lung infiltrate possible CAP  -procalcitonin with only mild elevation -will recollect - thoracentesis performed and patient improved with now only 1-2L/min  - Agree with empiric Cefepime / Zithromax   Bilateral pleural effusions - Reviewed fluid profile- lymphocyte predominant transudate suggestive of CHF vs MS related fluid -will attempt diuresis gently post improved BP -Thoracentesis  studies are still peding -repeat CXR today   BiIateral compressive atelectasis -will drain fluid and diurese -recruitment manuevers with Metaneb therapy utilising saline  -albuterol nebulizer -patient unable to use IS/Flutter due to MS hx   Moderate protein calorie  malnutrition   - hindering diuresis and may be cause of effusion   - will replete Albumin and consult RD nutritional consult    Circulatory shock-improved   - hindering ability to diurese- continue midodrine -present on admission - likely related to sepsis secondary to UTI vs CAP - resolved with now transient hypotension  -ACTH stim this weekend  -increasing midodrine to 10TID    Thank you for allowing me to participate in the care of this patient.   Patient/Family are satisfied with care plan and all questions have been answered.   This document was prepared using Dragon voice recognition software and may include unintentional dictation errors.     Ottie Glazier, M.D.  Division of [facility]

## 2020-05-25 LAB — COMP PANEL: LEUKEMIA/LYMPHOMA

## 2020-05-25 LAB — CBC
HCT: 29.1 % — ABNORMAL LOW (ref 36.0–46.0)
Hemoglobin: 9.3 g/dL — ABNORMAL LOW (ref 12.0–15.0)
MCH: 31.6 pg (ref 26.0–34.0)
MCHC: 32 g/dL (ref 30.0–36.0)
MCV: 99 fL (ref 80.0–100.0)
Platelets: 391 10*3/uL (ref 150–400)
RBC: 2.94 MIL/uL — ABNORMAL LOW (ref 3.87–5.11)
RDW: 15.5 % (ref 11.5–15.5)
WBC: 8.7 10*3/uL (ref 4.0–10.5)
nRBC: 0 % (ref 0.0–0.2)

## 2020-05-25 LAB — BODY FLUID CULTURE: Culture: NO GROWTH

## 2020-05-25 LAB — BASIC METABOLIC PANEL
Anion gap: 9 (ref 5–15)
BUN: 25 mg/dL — ABNORMAL HIGH (ref 8–23)
CO2: 27 mmol/L (ref 22–32)
Calcium: 8.7 mg/dL — ABNORMAL LOW (ref 8.9–10.3)
Chloride: 105 mmol/L (ref 98–111)
Creatinine, Ser: 0.69 mg/dL (ref 0.44–1.00)
GFR calc Af Amer: >60 mL/min (ref >60)
GFR calc non Af Amer: >60 mL/min (ref >60)
Glucose, Bld: 166 mg/dL — ABNORMAL HIGH (ref 70–99)
Potassium: 4.2 mmol/L (ref 3.5–5.1)
Sodium: 141 mmol/L (ref 135–145)

## 2020-05-25 LAB — GLUCOSE, CAPILLARY: Glucose-Capillary: 85 mg/dL (ref 70–99)

## 2020-05-25 LAB — CYTOLOGY - NON PAP

## 2020-05-25 MED ORDER — SODIUM CHLORIDE 0.9 % IV SOLN
1.0000 g | INTRAVENOUS | Status: DC
  Administered 2020-05-25 – 2020-05-26 (×2): 1 g via INTRAVENOUS
  Filled 2020-05-25: qty 10
  Filled 2020-05-25 (×2): qty 1

## 2020-05-25 NOTE — Progress Notes (Signed)
Pulmonary Medicine          Date: 05/25/2020,   MRN# [id number] Cynthia Price July 07, 1946     AdmissionWeight: 56.7 kg                 CurrentWeight: 52.5 kg   Referring physician: Dr Ree Kida   CHIEF COMPLAINT:   Loculated pleural effusion concerning for empyema   SUBJECTIVE   Patient resting in bed she is asleep during my evaluation.   Cynthia Price her friend is at bedside and shares patient has been malnourished at [facility], this can certainly be cause of her hypoalbuminemia with secondary development of third spaced fluid.  She is encephalopathic today which is new and may be due to cefepime I will switch this to rocephin today.  Leukocytosis is resolved and may have been transient from demargination post steroids during ACTH testing. Her vital signs have  Improved and her CXR is better. Marland Kitchen     PAST MEDICAL HISTORY   Past Medical History:  Diagnosis Date   Anemia 2012   from labs at [facility]   Back pain    s/p hemilaminectomy with h/o herniated disc at L4L5   Blood clot in vein    behind left knee   BP (high blood pressure) 03/08/2014   Last Assessment & Plan:  Blood pressure has been controlled without significant dizzyness or associated fatigue. Taking antihypertensives as directed without difficulty.     CAD (coronary artery disease) 10/18/2011   Chronic diastolic heart failure ([place]) 09/07/2014   Last Assessment & Plan:  Edema and sob seemingly baseline    Collagen vascular disease ([place])    Depression    after husband died   DVT (deep venous thrombosis) ([place]) 05/14/2012   Essential thrombocytosis ([facility])    Hyperlipidemia    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[place]) 03/05/2015   Hypotension 03/26/2013   IBS (irritable bowel syndrome)    Insomnia    Lupus anticoagulant positive    Macular degeneration    MS (multiple sclerosis) ([place])    Multiple sclerosis ([place]) 04/29/2008   Per neurology at [place] 04/29/2008   Per Neurology at  [facility]     RLS (restless legs syndrome)    Spinal stenosis    lumbar   Urinary retention    with high post void residuals 2013, per [facility] u[facility]     SURGICAL HISTORY   Past Surgical History:  Procedure Laterality Date   ANTERIOR LUMBAR FUSION  2012   APPENDECTOMY     CHOLECYSTECTOMY     CHOLECYSTECTOMY     LUMBAR [place] HISTORY   Family History  Problem Relation Age of Onset   Heart disease Mother    Thyroid cancer Mother    Ovarian cancer Mother    Alcohol abuse Father    Heart disease Father    Diabetes Sister    Diabetes Brother    Multiple sclerosis Paternal Aunt    Multiple sclerosis Cousin      SOCIAL HISTORY   Social History   Tobacco Use   Smoking status: Never Smoker   Smokeless tobacco: Never Used  Substance Use Topics   Alcohol use: No   Drug use: No     MEDICATIONS    Home Medication:    Current Medication:  Current Facility-Administered Medications:    0.9 %  sodium  chloride infusion, , Intravenous, PRN, Cristal Ford, DO, Last Rate: 10 mL/hr at 05/25/20 0914, 250 mL at 05/25/20 0914   acetaminophen (TYLENOL) tablet 650 mg, 650 mg, Oral, Q6H PRN, 650 mg at 05/23/20 1419 **OR** acetaminophen (TYLENOL) suppository 650 mg, 650 mg, Rectal, Q6H PRN, Oswald Hillock, RPH   albumin human 25 % solution 12.5 g, 12.5 g, Intravenous, Daily, Ottie Glazier, MD, Last Rate: 60 mL/hr at 05/25/20 1111, 12.5 g at 05/25/20 1111   azithromycin (ZITHROMAX) 500 mg in sodium chloride 0.9 % 250 mL IVPB, 500 mg, Intravenous, Q24H, Mikhail, [place], DO, Last Rate: 250 mL/hr at 05/24/20 1725, 500 mg at 05/24/20 1725   baclofen (LIORESAL) tablet 20 mg, 20 mg, Oral, TID, Mansy, Jan A, MD, 20 mg at 05/25/20 0981   cefTRIAXone (ROCEPHIN) 1 g in sodium chloride 0.9 % 100 mL IVPB, 1 g, Intravenous, Q24H, Shundra Wirsing, MD   chlorpheniramine-HYDROcodone (TUSSIONEX) 10-8 MG/5ML  suspension 5 mL, 5 mL, Oral, Q12H PRN, Mansy, Jan A, MD   diclofenac sodium (VOLTAREN) 1 % transdermal gel 4 g, 4 g, Topical, QID, Mansy, Jan A, MD, 4 g at 05/25/20 1327   escitalopram (LEXAPRO) tablet 20 mg, 20 mg, Oral, Daily, Mansy, Jan A, MD, 20 mg at 05/25/20 0918   famotidine (PEPCID) tablet 20 mg, 20 mg, Oral, BID, Mansy, Jan A, MD, 20 mg at 05/25/20 1914   feeding supplement (ENSURE ENLIVE) (ENSURE ENLIVE) liquid 237 mL, 237 mL, Oral, BID BM, Mikhail, [place], DO, 237 mL at 05/24/20 1127   fluticasone (FLONASE) 50 MCG/ACT nasal spray 1 spray, 1 spray, Each Nare, Daily, Mansy, Jan A, MD, 1 spray at 05/25/20 0918   gabapentin (NEURONTIN) tablet 600 mg, 600 mg, Oral, QID, Mansy, Jan A, MD, 600 mg at 05/25/20 1326   guaiFENesin (MUCINEX) 12 hr tablet 600 mg, 600 mg, Oral, BID, Mansy, Jan A, MD, 600 mg at 05/25/20 7829   hydrocortisone sodium succinate (SOLU-CORTEF) 100 MG injection 50 mg, 50 mg, Intravenous, Q8H, Mikhail, [place], DO, 50 mg at 05/25/20 1326   hydroxyurea (HYDREA) capsule 500 mg, 500 mg, Oral, Q MTWThF, Mansy, Jan A, MD, 500 mg at 05/25/20 0522   ipratropium-albuterol (DUONEB) 0.5-2.5 (3) MG/3ML nebulizer solution 3 mL, 3 mL, Nebulization, BID, Mansy, Jan A, MD, 3 mL at 05/25/20 0810   loratadine (CLARITIN) tablet 10 mg, 10 mg, Oral, Daily, Mansy, Jan A, MD, 10 mg at 05/25/20 5621   magnesium hydroxide (MILK OF MAGNESIA) suspension 30 mL, 30 mL, Oral, Daily PRN, Mansy, Jan A, MD   magnesium oxide (MAG-OX) tablet 400 mg, 400 mg, Oral, BID AC & HS, Mansy, Jan A, MD, 400 mg at 05/25/20 3086   metoCLOPramide (REGLAN) tablet 5 mg, 5 mg, Oral, Q6H PRN, Mansy, Jan A, MD, 5 mg at 05/23/20 1551   midodrine (PROAMATINE) tablet 10 mg, 10 mg, Oral, TID WC, Quin [place], MD, 10 mg at 05/25/20 1118   multivitamin with minerals tablet 1 tablet, 1 tablet, Oral, Daily, Mansy, Jan A, MD, 1 tablet at 05/25/20 0917   ondansetron (ZOFRAN) tablet 4 mg, 4 mg, Oral, Q6H PRN **OR**  ondansetron (ZOFRAN) injection 4 mg, 4 mg, Intravenous, Q6H PRN, Mansy, Jan A, MD, 4 mg at 05/23/20 5784   oxyCODONE (Oxy IR/ROXICODONE) immediate release tablet 5 mg, 5 mg, Oral, Q6H PRN, Mansy, Jan A, MD, 5 mg at 05/21/20 2139   oxyCODONE (Oxy IR/ROXICODONE) immediate release tablet 5 mg, 5 mg, Oral, Q6H, Mansy, Jan A, MD, 5 mg at 05/25/20 [phone]  rOPINIRole (REQUIP) tablet 2 mg, 2 mg, Oral, QID, Mansy, Jan A, MD, 2 mg at 05/25/20 1326   sodium chloride (OCEAN) 0.65 % nasal spray 1 spray, 1 spray, Each Nare, PRN, Cristal Ford, DO, 1 spray at 05/23/20 2124   sodium chloride 0.9 % bolus 250 mL, 250 mL, Intravenous, Once, Cristal Ford, DO   sodium chloride flush (NS) 0.9 % injection 3 mL, 3 mL, Intravenous, Q12H, Mikhail, [place], DO, 3 mL at 05/25/20 0919   tiZANidine (ZANAFLEX) tablet 4 mg, 4 mg, Oral, TID, Mansy, Jan A, MD, 4 mg at 05/25/20 1287   traZODone (DESYREL) tablet 75 mg, 75 mg, Oral, QHS, Mansy, Jan A, MD, 75 mg at 05/24/20 2206    ALLERGIES   Atorvastatin     REVIEW OF SYSTEMS    Review of Systems:  Gen:  Denies  fever, sweats, chills weigh loss  HEENT: Denies blurred vision, double vision, ear pain, eye pain, hearing loss, nose bleeds, sore throat Cardiac:  No dizziness, chest pain or heaviness, chest tightness,edema Resp:   Denies cough or sputum porduction, shortness of breath,wheezing, hemoptysis,  Gi: Denies swallowing difficulty, stomach pain, nausea or vomiting, diarrhea, constipation, bowel incontinence Gu:  Denies bladder incontinence, burning urine Ext:   Denies Joint pain, stiffness or swelling Skin: Denies  skin rash, easy bruising or bleeding or hives Endoc:  Denies polyuria, polydipsia , polyphagia or weight change Psych:   Denies depression, insomnia or hallucinations   Other:  All other systems negative   VS: BP 111/73 (BP Location: Right Arm)    Pulse 60    Temp 98.2 F (36.8 C) (Oral)    Resp 16    Ht 5\' 5"  (1.651 m)    Wt 52.5 kg     SpO2 95%    BMI 19.25 kg/m      PHYSICAL EXAM    GENERAL:NAD, no fevers, chills, no weakness no fatigue HEAD: Normocephalic, atraumatic.  EYES: Pupils equal, round, reactive to light. Extraocular muscles intact. No scleral icterus.  MOUTH: Moist mucosal membrane. Dentition intact. No abscess noted.  EAR, NOSE, THROAT: Clear without exudates. No external lesions.  NECK: Supple. No thyromegaly. No nodules. No JVD.  PULMONARY: Decreased breath sounds bilaterally improved CARDIOVASCULAR: S1 and S2. Regular rate and rhythm. No murmurs, rubs, or gallops. No edema. Pedal pulses 2+ bilaterally.  GASTROINTESTINAL: Soft, nontender, nondistended. No masses. Positive bowel sounds. No hepatosplenomegaly.  MUSCULOSKELETAL: No swelling, clubbing, or edema. Range of motion full in all extremities.  NEUROLOGIC: Cranial nerves II through XII are intact. No gross focal neurological deficits. Sensation intact. Reflexes intact.  SKIN: No ulceration, lesions, rashes, or cyanosis. Skin warm and dry. Turgor intact.  PSYCHIATRIC: Mood, affect within normal limits. The patient is awake, alert and oriented x 3. Insight, judgment intact.       IMAGING    DG Chest 1 View  Addendum Date: 05/21/2020   ADDENDUM REPORT: 05/21/2020 06:03 ADDENDUM: Additional attempts were made to reposition the patient. With diminished patient rotation and less obscuration of the left upper lung. These images reveal increasing now completely confluent opacity in the left lung base with air bronchograms likely reflecting a combination of consolidated lung, volume loss and layering effusion. Increasing opacities are present throughout the right mid to lower lung as well which could reflect further airspace disease or edema as well as small right effusion. This addendum will be called to the ordering clinician or representative by the Radiologist Assistant, and communication documented in the PACS or  [facility] Dashboard. Electronically  Signed   By: Lovena Le M.D.   On: 05/21/2020 06:03   Result Date: 05/21/2020 CLINICAL DATA:  Shortness of breath EXAM: CHEST  1 VIEW COMPARISON:  Radiograph 05/19/2020, CT 06/14/2015 FINDINGS: Difficulty with patient positioning with the head projecting over the left mid to upper lung obscuring much of the visible portions of the left lung itself. Additionally there is a steep right anterior obliquity. Redemonstration of some patchy and streaky right basilar opacity. More globally increased opacity in the retrocardiac space. Obscuration of the hemidiaphragms is suggestive of developing pleural effusions. No visible right pneumothorax. Left apex obscured. Visible mediastinal contours are not significantly changed from prior though much of the left heart border is obscured. No acute osseous or soft tissue abnormality. Chronic rib deformities are again seen. Telemetry leads overlie the chest. IMPRESSION: 1. Persistent right basilar airspace opacities with developing right effusion. 2. Globally increased opacity in the retrocardiac space, could reflect developing pleural effusion, atelectasis or pneumonia. 3. Marked patient rotation as well patient's head obscuring the left mid to upper lung, limiting evaluation. Electronically Signed: By: Lovena Le M.D. On: 05/21/2020 05:13   CT CHEST W CONTRAST  Result Date: 05/21/2020 CLINICAL DATA:  Respiratory failure history of multiple sclerosis and DVT on Xarelto. EXAM: CT CHEST WITH CONTRAST TECHNIQUE: Multidetector CT imaging of the chest was performed during intravenous contrast administration. CONTRAST:  33mL OMNIPAQUE IOHEXOL 300 MG/ML  SOLN COMPARISON:  06/13/2015 FINDINGS: Cardiovascular: Heart size is normal with small pericardial effusion. Central pulmonary vasculature is normal on venous phase assessment, study not protocol for PE evaluation. Aortic caliber is normal. Mediastinum/Nodes: No axillary adenopathy. Thoracic inlet structures are normal.  Lungs/Pleura: Moderate bilateral pleural effusions. Basilar airspace disease bilaterally. Effusion greatest on the RIGHT. Airspace disease greatest on the LEFT with component of volume loss and shift of mediastinal structures from RIGHT to LEFT into the LEFT chest. Upper Abdomen: Incidental imaging of upper abdominal contents without acute process. Musculoskeletal: Chronic deformity of the sternum indicative of prior fracture. Evidence of spinal fusion at the upper portion of the lumbar spine, incompletely imaged on CT data. No acute bone finding. T11 compression with similar appearance. IMPRESSION: 1. Moderate bilateral pleural effusions with bibasilar airspace disease greatest on the LEFT with component of volume loss and shift of mediastinal structures from RIGHT to LEFT into the LEFT chest. Much of this appears to represent volume loss though there is some areas of variable enhancement that raise the question of concomitant pneumonia particularly at the LEFT lung base. Loss of air bronchograms raising the question of aspiration though there is no material seen in the trachea or larger airways. 2. Small pericardial effusion. 3. Chronic deformity of the sternum indicative of prior fracture. 4. T11 compression with similar appearance. 5. Aortic atherosclerosis. Aortic Atherosclerosis (ICD10-I70.0). Electronically Signed   By: Zetta Bills M.D.   On: 05/21/2020 11:06   DG Chest Port 1 View  Result Date: 05/23/2020 CLINICAL DATA:  Per chart Patient admitted with sepsis secondary to UTI and possibly pneumonia. Hx of high blood pressure, chronic heart failure, hypotension. Non smoker. EXAM: PORTABLE CHEST 1 VIEW COMPARISON:  05/21/2020 FINDINGS: Left lung base opacity obscures the hemidiaphragm and left heart border, associated with air bronchograms. There is bilateral vascular congestion and interstitial thickening. Hazy opacity is noted at the right lung base, which appears increased from the previous exam.  Cardiac silhouette is normal in size. No mediastinal or hilar masses. Small bilateral effusions, better appreciated on  the CT from 05/21/2020. No pneumothorax. IMPRESSION: 1. Mild increase in hazy opacity at the right lung base compared to the most recent prior exam, which may reflect an increase in atelectasis, pneumonia or a combination. 2. No other change. 3. Persistent consolidation in the left lung base, which may reflect pneumonia or atelectasis. Bilateral vascular congestion and interstitial prominence, also unchanged, without overt pulmonary edema. Electronically Signed   By: Lajean Manes M.D.   On: 05/23/2020 15:42   DG Chest Port 1 View  Result Date: 05/21/2020 CLINICAL DATA:  Status post right-sided thoracentesis. EXAM: PORTABLE CHEST 1 VIEW COMPARISON:  May 21, 2020 (4:34 a.m.) FINDINGS: The study is limited secondary to limited patient positioning. The lungs are hyperinflated. Mild to moderate severity diffuse chronic appearing increased interstitial lung markings are seen. There is opacification of the lower half of the left lung. No pneumothorax is identified. The cardiac silhouette is borderline in size. Multiple chronic left-sided rib fractures are seen. Degenerative changes seen throughout the thoracic spine. IMPRESSION: 1. Opacification of the lower half of the left lung consistent with moderate-sized pleural effusion and associated atelectasis versus infiltrate. 2. Underlying chronic interstitial lung disease. Electronically Signed   By: Virgina Norfolk M.D.   On: 05/21/2020 16:58   DG Chest Portable 1 View  Result Date: 05/19/2020 CLINICAL DATA:  Sepsis.  Hypotension.  Fever. EXAM: PORTABLE CHEST 1 VIEW COMPARISON:  08/19/2017 FINDINGS: Patient's chin obscures the apices. Significant patient rotation. Stable heart size and mediastinal contours. Streaky retrocardiac opacity. Patchy right lung base opacity. Possible small pleural effusions. No pneumothorax. No pulmonary edema. Bones  under mineralized with scoliotic curvature of the spine. IMPRESSION: 1. Streaky retrocardiac opacity and patchy right lung base opacity, may be atelectasis or pneumonia. Possible small pleural effusions. 2. Rotated exam. Electronically Signed   By: Keith Rake M.D.   On: 05/19/2020 01:01   US THORACENTESIS ASP PLEURAL SPACE W/IMG GUIDE  Result Date: 05/21/2020 CLINICAL DATA:  Respiratory failure and bilateral pleural effusions, right greater than left. EXAM: ULTRASOUND GUIDED RIGHT THORACENTESIS COMPARISON:  None. PROCEDURE: An ultrasound guided thoracentesis was thoroughly discussed with the patient and questions answered. The benefits, risks, alternatives and complications were also discussed. The patient understands and wishes to proceed with the procedure. Written consent was obtained. Ultrasound was performed to localize and mark an adequate pocket of fluid in the right chest. The area was then prepped and draped in the normal sterile fashion. 1% Lidocaine was used for local anesthesia. Under ultrasound guidance a 6 French Safe-T-Centesis catheter was introduced. Thoracentesis was performed. The catheter was removed and a dressing applied. COMPLICATIONS: None FINDINGS: A total of approximately 550 mL of clear, yellow fluid was removed. A fluid sample was sent for laboratory analysis. IMPRESSION: Successful ultrasound guided right thoracentesis yielding 550 mL of pleural fluid. Electronically Signed   By: Aletta Edouard M.D.   On: 05/21/2020 17:00         ASSESSMENT/PLAN   Acute hypoxemic respiratory failure likley due to bilateral compressive atelectasis  -present on admission   - patient with febrile illness and left lung infiltrate possible CAP  -procalcitonin with only mild elevation -will trend - thoracentesis performed and patient improved with now only 1-2L/min [place] - Changing Cefepime to rocephin due to enephalopathy with visual halluciantions "seeing ants and feeling like shes  falling down" per Cynthia Gala at bedside , continue  Zithromax   Bilateral pleural effusions - Reviewed fluid profile- lymphocyte predominant transudate suggestive of CHF vs MS related fluid -  will attempt diuresis gently post improved BP -Thoracentesis studies are still peding -repeat CXR today   BiIateral compressive atelectasis -will drain fluid and diurese -recruitment manuevers with Metaneb therapy utilising saline  -albuterol nebulizer -patient unable to use IS/Flutter due to MS hx   Moderate protein calorie malnutrition   - hindering diuresis and may be cause of effusion   - will replete Albumin and consult RD nutritional consult    Circulatory shock-improved   - hindering ability to diurese- continue midodrine -present on admission - likely related to sepsis secondary to UTI vs CAP - resolved with now transient hypotension  -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   Patient/Family are satisfied with care plan and all questions have been answered.   This document was prepared using Dragon voice recognition software and may include unintentional dictation errors.     Ottie Glazier, M.D.  Division of [place]

## 2020-05-25 NOTE — Care Management Important Message (Signed)
Important Message  Patient Details  Name: Cynthia Price MRN: [id number] Date of Birth: 03/11/1946   Medicare Important Message Given:  Yes     Dannette Barbara 05/25/2020, 11:51 AM

## 2020-05-25 NOTE — Progress Notes (Signed)
Initial Nutrition Assessment  DOCUMENTATION CODES:   Severe malnutrition in context of chronic illness  INTERVENTION:   Ensure Enlive po BID, each supplement provides 350 kcal and 20 grams of protein  Magic cup TID with meals, each supplement provides 290 kcal and 9 grams of protein  MVI daily   NUTRITION DIAGNOSIS:   Severe Malnutrition related to chronic illness (MS, dementia, IBS, CHF) as evidenced by moderate to severe fat depletions, moderate to severe muscle depletions.  GOAL:   Patient will meet greater than or equal to 90% of their needs  -progressing   MONITOR:   PO intake, Supplement acceptance, Labs, Weight trends, Skin, I & O's  ASSESSMENT:   74 y.o. female with a known history of multiple sclerosis, DVT on Xarelto, depression, coronary artery disease, IBS, chronic anemia, mild dementia and chronic diastolic CHF who is admitted with UTI and PNA  Met with pt at bedside. Pt sleeping at time of RD visit so history obtained from visitor at bedside. Per visitor, pt with fairly good appetite and oral intake at baseline. Pt is unable to feed herself and requires full assist with meals. Visitor reports that family members take turns going to her SNF to help feed her. Pt does enjoy milkshakes, chocolate ice cream and chocolate Ensure supplements. Per chart, pt eating <50% of meals in hospital. Family has also been bringing in some food. Per MD note, pt is not feeling as well today as she has been. Per visitor, today is the worst day, nutrition wise that patient has had since being here. Pt did eat 3 applesauce's today along with some chocolate ice cream. Pt wishes to continue chocolate Ensure.  Medications reviewed and include: pepcid, Mg oxide, MVI, albumin, azithromycin, ceftriaxone   Labs reviewed: BUN 25(H) Hgb 9.3(L), Hct 29.1(L)  Diet Order:   Diet Order            DIET - DYS 1 Room service appropriate? Yes; Fluid consistency: Thin  Diet effective now                 EDUCATION NEEDS:   Education needs have been addressed  Skin:  Skin Assessment: Reviewed RN Assessment (ecchymosis, MASD)  Last BM:  6/28- type 6  Height:   Ht Readings from Last 1 Encounters:  05/19/20 _0  (1.651 m)    Weight:   Wt Readings from Last 1 Encounters:  05/25/20 52.5 kg    Ideal Body Weight:  56.8 kg  BMI:  Body mass index is 19.25 kg/m.  Estimated Nutritional Needs:   Kcal:  1400-1600kcal/day  Protein:  70-80g/day  Fluid:  >1.3L/day  Koleen Distance MS, RD, LDN Please refer to [facility] for RD and/or RD on-call/weekend/after hours pager

## 2020-05-25 NOTE — Progress Notes (Signed)
This lady clearly needs to be NPO. When arrived to room to administer meds,  was noted to be drooling and unable to handle her own secretions. She also lacks the strength to hold her head in an upright position. Will keep NPO and reconsult ST. From my assessment, any PO intake is extremely high risk for aspiration.

## 2020-05-25 NOTE — Progress Notes (Signed)
PROGRESS NOTE    Cynthia Price  FGH:[id number] DOB: [birth year] DOA: 05/18/2020 PCP: [name], MD   Brief Narrative:  HPI on 05/19/2020 by Dr. Eugenie Norrie Cynthia Price  is a 74 y.o. female with a known history of multiple sclerosis, DVT on Xarelto, depression, coronary artery disease, IBS, chronic anemia and chronic diastolic CHF who comes from her skilled nursing facility with acute onset of fever for the last couple of days for which she was given Tylenol and was noted to have hypotension that prompted 911 call.  Upon arrival EMS her blood pressure was 60/44 for which she was given IV fluid bolus and blood pressure was up to the 80s in the ER.  No history could be obtained from the patient due to her altered mental status and likely underlying dementia.  Interim history Patient admitted with sepsis secondary to UTI and possibly pneumonia, currently placed on IV antibiotics.  Also noted to have anemia, Xarelto held as FOBT was positive, GI consulted.  Overnight, patient became febrile with tachycardia and hypoxia, repeat chest x-ray was obtained.  Pulmonary consulted. S/p thoracentesis. Assessment & Plan   Sepsis secondary to UTI/pneumonia with pleural effusions -Patient presented with fever, hypotension, tachycardia -Overnight, patient became febrile along with tachycardia and hypoxia -Repeat chest x-ray on 05/21/2020: Reviewed and shows worsening of left-sided pleural effusion and pneumonia -UA: Trace leukocytes, negative nitrites, many bacteria, > 50 WBC -Urine culture shows multiple species, Recollection suggested -Blood culture showed no growth to date -Given fever, was placed on cefepime. Given the mild encephalopathy, changed to ceftriaxone -Continue azithromycin (MRSA PCR was negative) -Pulmonology consulted and appreciated -CT chest: Moderate bilateral pleural effusions with bibasilar airspace disease greatest on the left with volume loss and shift of mediastinal structures  from right to left into the left chest.  Small pericardial effusion. -IR consulted and appreciated, status post ultrasound-guided right thoracentesis yielding 550 mL of fluid -Fluid culture shows no growth to date -Fluid appears transudative  Acute hypoxic respiratory failure -Likely multifactorial including pleural effusions and pneumonia -Continue supplemental oxygen to maintain saturations above 90% (patient has been weaned down to 1 to 2 L from 4 L previously) -s/p thoracentesis -Continue treatment plan as above   Acute on chronic macrocytic anemia/ GI Bleed -Hemoglobin had dropped to 6.6 -Baseline hemoglobin between 9 and 10 -Patient transfused 1 unit PRBC on 05/20/2020, hemoglobin currently 9.3 -Xarelto currently held -Anemia panel obtained, patient with adequate iron and iron storage.  TIBC 133, folate 16.8 -FOBT positive -Gastroenterology consulted and appreciated- and has signed off -Given that hemoglobin is currently stable, will consider restarting Xarelto during hospitalization   Hypotension -Patient was given IV albumin with IV fluids -BP has stabilized, continue to monitor closely -Continue midodrine -Blood pressures have been more stable and normotensive -ACTH stim test done -AM cortisol WNL -continue solucortef- possibly change to florinef in the next 1-2 days  Acute metabolic encephalopathy -Possibly secondary to cefepime -Transition to ceftriaxone -Will continue to monitor closely -Patient voiced to me that she was not feeling like herself this morning.  ?  Aspiration/dysphagia -Speech therapy consulted, recommending dysphagia 1 diet with thin liquids  Multiple sclerosis -Continue baclofen  Depression -Continue Cymbalta  History of DVT -As above, Xarelto held  T11 compression -Noted on CT chest -Continue supportive care  Malnutrition, moderate -Nutrition consulted-continue supplements -Albumin 2.7 -given albumin  DVT Prophylaxis  SCDs  Code  Status: DNR  Family Communication: None at bedside  Disposition Plan:  Status is: Inpatient  Remains inpatient appropriate because:Hemodynamically unstable, Ongoing diagnostic testing needed not appropriate for outpatient work up and IV treatments appropriate due to intensity of illness or inability to take PO   Dispo: The patient is from: SNF              Anticipated d/c is to: SNF              Anticipated d/c date is: > 3 days              Patient currently is not medically stable to d/c.   Consultants Gastroenterology Pulmonology Interventional radiology  Procedures  Ultrasound guided right thoracentesis  Antibiotics   Anti-infectives (From admission, onward)   Start     Dose/Rate Route Frequency Ordered Stop   05/25/20 1500  cefTRIAXone (ROCEPHIN) 1 g in sodium chloride 0.9 % 100 mL IVPB     Discontinue     1 g 200 mL/hr over 30 Minutes Intravenous Every 24 hours 05/25/20 1409     05/23/20 1700  azithromycin (ZITHROMAX) 500 mg in sodium chloride 0.9 % 250 mL IVPB     Discontinue     500 mg 250 mL/hr over 60 Minutes Intravenous Every 24 hours 05/23/20 1658     05/21/20 1200  ceFEPIme (MAXIPIME) 2 g in sodium chloride 0.9 % 100 mL IVPB  Status:  Discontinued        2 g 200 mL/hr over 30 Minutes Intravenous Every 12 hours 05/21/20 1121 05/25/20 1409   05/21/20 1000  azithromycin (ZITHROMAX) tablet 500 mg        500 mg Oral  Once 05/20/20 0918 05/21/20 0913   05/19/20 0600  cefTRIAXone (ROCEPHIN) 2 g in sodium chloride 0.9 % 100 mL IVPB  Status:  Discontinued        2 g 200 mL/hr over 30 Minutes Intravenous Every 24 hours 05/19/20 0451 05/21/20 1121   05/19/20 0600  azithromycin (ZITHROMAX) 500 mg in sodium chloride 0.9 % 250 mL IVPB  Status:  Discontinued        500 mg 250 mL/hr over 60 Minutes Intravenous Every 24 hours 05/19/20 0451 05/20/20 0918   05/19/20 0045  ceFEPIme (MAXIPIME) 1 g in sodium chloride 0.9 % 100 mL IVPB        1 g 200 mL/hr over 30 Minutes  Intravenous  Once 05/19/20 0036 05/19/20 0125   05/19/20 0045  vancomycin (VANCOCIN) IVPB 1000 mg/200 mL premix        1,000 mg 200 mL/hr over 60 Minutes Intravenous  Once 05/19/20 0036 05/19/20 0230      Subjective:   Cynthia Price seen and examined today.  History of dementia.  Patient complains of not feeling like herself this morning.  She cannot elaborate on this.  She currently continues to complain of nasal congestion.  Denies current headache, dizziness.  Denies current chest pain.  Feels breathing is about the same.  Denies current abdominal pain, nausea or vomiting, diarrhea or constipation.    Objective:   Vitals:   05/25/20 0500 05/25/20 0720 05/25/20 0810 05/25/20 1150  BP:  103/70  111/73  Pulse:  66  60  Resp: 14 18  16   Temp:  98.7 F (37.1 C)  98.2 F (36.8 C)  TempSrc:  Oral  Oral  SpO2:  90% 94% 95%  Weight:      Height:        Intake/Output Summary (Last 24 hours) at 05/25/2020 1439 Last data filed at 05/25/2020 0950 Gross per  24 hour  Intake 524.55 ml  Output 102 ml  Net 422.55 ml   Filed Weights   05/21/20 0359 05/22/20 0442 05/25/20 0444  Weight: 50.7 kg 51.8 kg 52.5 kg   Exam  General: Well developed, chronically ill appearing, NAD  HEENT: NCAT, mucous membranes moist.   Cardiovascular: S1 S2 auscultated, RRR  Respiratory: Diminished breath sounds  Abdomen: Soft, nontender, nondistended, + bowel sounds  Extremities: warm dry without cyanosis clubbing or edema.  Left upper extremity edema improving  Neuro: AAOx 2, dementia, nonfocal  Psych: Appropriate mood and affect, pleasant   Data Reviewed: I have personally reviewed following labs and imaging studies  CBC: Recent Labs  Lab 05/19/20 0009 05/19/20 0503 05/21/20 0636 05/22/20 0554 05/23/20 0316 05/24/20 0534 05/25/20 0558  WBC 10.4   < > 10.1 10.2 10.8* 12.3* 8.7  NEUTROABS 8.6*  --   --   --   --   --   --   HGB 8.3*   < > 9.5* 9.7* 10.2* 10.1* 9.3*  HCT 26.4*   < > 29.6*  29.4* 31.9* 31.9* 29.1*  MCV 101.9*   < > 97.4 96.4 97.3 97.0 99.0  PLT 302   < > 344 340 366 378 391   < > = values in this interval not displayed.   Basic Metabolic Panel: Recent Labs  Lab 05/21/20 0636 05/22/20 0554 05/23/20 0316 05/24/20 0534 05/25/20 0558  NA 141 143 139 142 141  K 3.9 4.0 3.6 3.6 4.2  CL 108 110 104 105 105  CO2 26 24 25 29 27   GLUCOSE 105* 84 98 87 166*  BUN 11 15 18 20  25*  CREATININE 0.64 0.65 0.66 0.66 0.69  CALCIUM 8.1* 8.4* 8.4* 8.5* 8.7*   GFR: Estimated Creatinine Clearance: 51.1 mL/min (by C-G formula based on SCr of 0.69 mg/dL). Liver Function Tests: Recent Labs  Lab 05/19/20 0009  AST 18  ALT 10  ALKPHOS 72  BILITOT 0.6  PROT 6.3*  ALBUMIN 2.7*   No results for input(s): LIPASE, AMYLASE in the last 168 hours. No results for input(s): AMMONIA in the last 168 hours. Coagulation Profile: Recent Labs  Lab 05/19/20 0503  INR 1.7*   Cardiac Enzymes: No results for input(s): CKTOTAL, CKMB, CKMBINDEX, TROPONINI in the last 168 hours. BNP (last 3 results) No results for input(s): PROBNP in the last 8760 hours. HbA1C: No results for input(s): HGBA1C in the last 72 hours. CBG: Recent Labs  Lab 05/24/20 0614  GLUCAP 85   Lipid Profile: No results for input(s): CHOL, HDL, LDLCALC, TRIG, CHOLHDL, LDLDIRECT in the last 72 hours. Thyroid Function Tests: No results for input(s): TSH, T4TOTAL, FREET4, T3FREE, THYROIDAB in the last 72 hours. Anemia Panel: No results for input(s): VITAMINB12, FOLATE, FERRITIN, TIBC, IRON, RETICCTPCT in the last 72 hours. Urine analysis:    Component Value Date/Time   COLORURINE AMBER (A) 05/19/2020 0010   APPEARANCEUR TURBID (A) 05/19/2020 0010   APPEARANCEUR Clear 09/25/2015 1020   LABSPEC 1.016 05/19/2020 0010   LABSPEC 1.011 11/02/2014 1914   PHURINE 8.0 05/19/2020 0010   GLUCOSEU NEGATIVE 05/19/2020 0010   GLUCOSEU Negative 11/02/2014 1914   HGBUR NEGATIVE 05/19/2020 0010   HGBUR trace-intact  04/29/2010 0952   BILIRUBINUR NEGATIVE 05/19/2020 0010   BILIRUBINUR Negative 09/25/2015 1020   BILIRUBINUR Negative 11/02/2014 1914   KETONESUR 5 (A) 05/19/2020 0010   PROTEINUR 100 (A) 05/19/2020 0010   UROBILINOGEN negative 02/04/2014 1603   UROBILINOGEN 0.2 04/29/2010 5615  NITRITE NEGATIVE 05/19/2020 0010   LEUKOCYTESUR TRACE (A) 05/19/2020 0010   LEUKOCYTESUR Negative 11/02/2014 1914   Sepsis Labs: @LABRCNTIP (procalcitonin:4,lacticidven:4)  ) Recent Results (from the past 240 hour(s))  Blood culture (routine x 2)     Status: None   Collection Time: 05/19/20 12:10 AM   Specimen: Right Antecubital; Blood  Result Value Ref Range Status   Specimen Description RIGHT ANTECUBITAL  Final   Special Requests   Final    BOTTLES DRAWN AEROBIC AND ANAEROBIC Blood Culture results may not be optimal due to an excessive volume of blood received in culture bottles   Culture   Final    NO GROWTH 5 DAYS Performed at [facility], [address] [place], [place] [address]    Report Status 05/24/2020 FINAL  Final  Respiratory Panel by RT PCR (Flu A&B, Covid) - Nasopharyngeal Swab     Status: None   Collection Time: 05/19/20 12:10 AM   Specimen: Nasopharyngeal Swab  Result Value Ref Range Status   SARS Coronavirus 2 by RT PCR NEGATIVE NEGATIVE Final    Comment: (NOTE) SARS-CoV-2 target nucleic acids are NOT DETECTED.  The SARS-CoV-2 RNA is generally detectable in upper respiratoy specimens during the acute phase of infection. The lowest concentration of SARS-CoV-2 viral copies this assay can detect is 131 copies/mL. A negative result does not preclude SARS-Cov-2 infection and should not be used as the sole basis for treatment or other patient management decisions. A negative result may occur with  improper specimen collection/handling, submission of specimen other than nasopharyngeal swab, presence of viral mutation(s) within the areas targeted by this assay, and inadequate  number of viral copies (<131 copies/mL). A negative result must be combined with clinical observations, patient history, and epidemiological information. The expected result is Negative.  Fact Sheet for Patients:  PinkCheek.be  Fact Sheet for Healthcare Providers:  GravelBags.it  This test is no t yet approved or cleared by the [place] FDA and  has been authorized for detection and/or diagnosis of SARS-CoV-2 by FDA under an Emergency Use Authorization (EUA). This EUA will remain  in effect (meaning this test can be used) for the duration of the COVID-19 declaration under Section 564(b)(1) of the Act, 21 U.S.C. section 360bbb-3(b)(1), unless the authorization is terminated or revoked sooner.     Influenza A by PCR NEGATIVE NEGATIVE Final   Influenza B by PCR NEGATIVE NEGATIVE Final    Comment: (NOTE) The Xpert Xpress SARS-CoV-2/FLU/RSV assay is intended as an aid in  the diagnosis of influenza from Nasopharyngeal swab specimens and  should not be used as a sole basis for treatment. Nasal washings and  aspirates are unacceptable for Xpert Xpress SARS-CoV-2/FLU/RSV  testing.  Fact Sheet for Patients: PinkCheek.be  Fact Sheet for Healthcare Providers: GravelBags.it  This test is not yet approved or cleared by the [place] FDA and  has been authorized for detection and/or diagnosis of SARS-CoV-2 by  FDA under an Emergency Use Authorization (EUA). This EUA will remain  in effect (meaning this test can be used) for the duration of the  Covid-19 declaration under Section 564(b)(1) of the Act, 21  U.S.C. section 360bbb-3(b)(1), unless the authorization is  terminated or revoked. Performed at [facility], [address], [place], [place] [address]   Urine culture     Status: Abnormal   Collection Time: 05/19/20 12:11 AM   Specimen: Urine, Clean  Catch  Result Value Ref Range Status   Specimen Description   Final  URINE, CLEAN CATCH Performed at [facility], [address], [place], [place] [address]    Special Requests   Final    NONE Performed at [facility], [address] [place], [place] [address]    Culture MULTIPLE SPECIES PRESENT, SUGGEST RECOLLECTION (A)  Final   Report Status 05/20/2020 FINAL  Final  Blood culture (routine x 2)     Status: None   Collection Time: 05/19/20 12:27 AM   Specimen: BLOOD RIGHT HAND  Result Value Ref Range Status   Specimen Description BLOOD RIGHT HAND  Final   Special Requests   Final    BOTTLES DRAWN AEROBIC AND ANAEROBIC Blood Culture adequate volume   Culture   Final    NO GROWTH 5 DAYS Performed at [facility], [address] [place], [place] [address]    Report Status 05/24/2020 FINAL  Final  MRSA PCR Screening     Status: None   Collection Time: 05/19/20  6:31 PM   Specimen: Nasopharyngeal  Result Value Ref Range Status   MRSA by PCR NEGATIVE NEGATIVE Final    Comment:        The GeneXpert MRSA Assay (FDA approved for NASAL specimens only), is one component of a comprehensive MRSA colonization surveillance program. It is not intended to diagnose MRSA infection nor to guide or monitor treatment for MRSA infections. Performed at [facility], [address] [place], [place] [address]   Body fluid culture     Status: None   Collection Time: 05/21/20  4:38 PM   Specimen: PATH Cytology Pleural fluid  Result Value Ref Range Status   Specimen Description   Final    PLEURAL Performed at [facility], [address] [place], [place] [address]    Special Requests NONE  Final   Gram Stain   Final    FEW WBC PRESENT, PREDOMINANTLY MONONUCLEAR NO ORGANISMS SEEN    Culture   Final    NO GROWTH Performed at [facility], [place] [address] [place], [place] [address]    Report Status 05/25/2020 FINAL  Final    Acid Fast Smear (AFB)     Status: None   Collection Time: 05/21/20  4:38 PM   Specimen: PATH Cytology Pleural fluid  Result Value Ref Range Status   AFB Specimen Processing Concentration  Final   Acid Fast Smear Negative  Final    Comment: (NOTE) Performed At: [facility] [address] [place], [place] [id number] Rush Farmer MD WE:[id number]    Source (AFB) PLEURAL  Final    Comment: Performed at [facility], [address], [place],  [address]      Radiology Studies: DG Chest Port 1 View  Result Date: 05/23/2020 CLINICAL DATA:  Per chart Patient admitted with sepsis secondary to UTI and possibly pneumonia. Hx of high blood pressure, chronic heart failure, hypotension. Non smoker. EXAM: PORTABLE CHEST 1 VIEW COMPARISON:  05/21/2020 FINDINGS: Left lung base opacity obscures the hemidiaphragm and left heart border, associated with air bronchograms. There is bilateral vascular congestion and interstitial thickening. Hazy opacity is noted at the right lung base, which appears increased from the previous exam. Cardiac silhouette is normal in size. No mediastinal or hilar masses. Small bilateral effusions, better appreciated on the CT from 05/21/2020. No pneumothorax. IMPRESSION: 1. Mild increase in hazy opacity at the right lung base compared to the most recent prior exam, which may reflect an increase in atelectasis, pneumonia or a combination.  2. No other change. 3. Persistent consolidation in the left lung base, which may reflect pneumonia or atelectasis. Bilateral vascular congestion and interstitial prominence, also unchanged, without overt pulmonary edema. Electronically Signed   By: Lajean Manes M.D.   On: 05/23/2020 15:42     Scheduled Meds: . baclofen  20 mg Oral TID  . diclofenac sodium  4 g Topical QID  . escitalopram  20 mg Oral Daily  . famotidine  20 mg Oral BID  . feeding supplement (ENSURE ENLIVE)  237 mL Oral BID BM  . gabapentin  600 mg Oral  QID  . guaiFENesin  600 mg Oral BID  . hydrocortisone sod succinate (SOLU-CORTEF) inj  50 mg Intravenous Q8H  . hydroxyurea  500 mg Oral Q MTWThF  . ipratropium-albuterol  3 mL Nebulization BID  . loratadine  10 mg Oral Daily  . magnesium oxide  400 mg Oral BID AC & HS  . midodrine  10 mg Oral TID WC  . multivitamin with minerals  1 tablet Oral Daily  . oxyCODONE  5 mg Oral Q6H  . sodium chloride flush  3 mL Intravenous Q12H  . tiZANidine  4 mg Oral TID  . traZODone  75 mg Oral QHS   Continuous Infusions: . sodium chloride 250 mL (05/25/20 0914)  . albumin human 12.5 g (05/25/20 1111)  . azithromycin 500 mg (05/24/20 1725)  . cefTRIAXone (ROCEPHIN)  IV    . sodium chloride       LOS: 6 days   Time Spent in minutes   30 minutes  Arslan Kier D.O. on 05/25/2020 at 2:39 PM  Between 7am to 7pm - Please see pager noted on [email or web address]  After 7pm go to [email or web address]  And look for the night coverage person covering for me after hours  [facility] Office  [phone]

## 2020-05-26 DIAGNOSIS — E43 Unspecified severe protein-calorie malnutrition: Secondary | ICD-10-CM | POA: Diagnosis present

## 2020-05-26 LAB — HEMOGLOBIN AND HEMATOCRIT, BLOOD
HCT: 27.9 % — ABNORMAL LOW (ref 36.0–46.0)
Hemoglobin: 9 g/dL — ABNORMAL LOW (ref 12.0–15.0)

## 2020-05-26 LAB — PH, BODY FLUID: pH, Body Fluid: 7.9

## 2020-05-26 MED ORDER — FUROSEMIDE 10 MG/ML IJ SOLN
20.0000 mg | Freq: Once | INTRAMUSCULAR | Status: AC
  Administered 2020-05-26: 20 mg via INTRAVENOUS
  Filled 2020-05-26: qty 2

## 2020-05-26 NOTE — Progress Notes (Addendum)
  Speech Language Pathology Treatment: Dysphagia  Patient Details Name: Cynthia Price MRN: [id number] DOB: 06/07/1946 Today's Date: 05/26/2020 Time: 0830-0910 SLP Time Calculation (min) (ACUTE ONLY): 40 min  Assessment / Plan / Recommendation Clinical Impression  Pt currently on ST caseload and has been consuming dysphagia 1 diet with thin liquids via straw and medicine crushed in puree since 05/20/2020 without any report of overt s/s of aspiration or dysphagia.    Pt recently made NPO and ST re-consulted d/t pt poor positioning and drooling. SLP provided treatment session this morning and pt continues to be in the unfortunate posture that is baseline d/t skeletal-muscular disease. Given the severity of her baseline forward head positioning, pt does drool especially when relaxed. Pt continues to be unable to be positioned in traditional upright position for PO consumption. All of the above are pre-existing baseline characteristics.   During this session, pt consumed single sips of thin liquids via straw and puree without any overt s/s of aspiration or dysphagia. While silent aspiration cannot be ruled out at bedside, pt's overall respiratory status appears to have improved. Per chart, chest x-ray to be performed today and ST to continue following pt for diet toleration.   At this time, previously recommended diet of dysphagia 1 with thin liquids via straw, medicine crushed in puree is still indicated.   Education provided to pt's nurse on current recommendations.    HPI HPI: Cynthia Price  is a 74 y.o. female with a known history of multiple sclerosis, DVT on Xarelto, depression, coronary artery disease, IBS, chronic anemia and chronic diastolic CHF who comes from her skilled nursing facility with acute onset of fever for the last couple of days for which she was given Tylenol and was noted to have hypotension (BP 76/46). UA positive for UTI, Chest x-ray showed streaky retrocardiac opacity and  patchy right lung base opacity that may be atelectasis or pneumonia with possible small pleural effusions.      SLP Plan  Continue with current plan of care       Recommendations  Diet recommendations: Dysphagia 1 (puree);Thin liquid Liquids provided via: Straw Medication Administration: Crushed with puree Supervision: Full supervision/cueing for compensatory strategies;Staff to assist with self feeding Compensations: Minimize environmental distractions;Slow rate;Small sips/bites Postural Changes and/or Swallow Maneuvers: Seated upright 90 degrees;Upright 30-60 min after meal                Oral Care Recommendations: Oral care before and after PO Follow up Recommendations: Skilled Nursing facility SLP Visit Diagnosis: Dysphagia, unspecified (R13.10) Plan: Continue with current plan of care       GO             Caidance Sybert B. Rutherford Nail M.S., CCC-SLP, [place] Office [phone]    [name] 05/26/2020, 8:51 AM

## 2020-05-26 NOTE — Consult Note (Signed)
Consultation Note Date: 05/26/2020   Patient Name: Cynthia Price  DOB: Jun 14, 1946  MRN: [id number]  Age / Sex: 74 y.o., female  PCP: [name], MD Referring Physician: Cristal Ford, DO  Reason for Consultation: Establishing goals of care  HPI/Patient Profile: Cynthia Price  is a 75 y.o. female with a known history of multiple sclerosis, DVT on Xarelto, depression, coronary artery disease, IBS, chronic anemia and chronic diastolic CHF who comes from her skilled nursing facility with acute onset of fever for the last couple of days.  Clinical Assessment and Goals of Care: Patient is resting in bed. She tells me she is widowed and has 2 children. She states she lives at a facility. Functionally she has MS which she states she was diagnosed with when she was 74 years old. She is unable to move her body and requests a sip of water. This was provided.  We discussed her diagnoses, prognosis, GOC, EOL wishes disposition and options.  A detailed discussion was had today regarding advanced directives.  The difference between an aggressive medical intervention path and a comfort care path was discussed.  Values and goals of care important to patient and family were attempted to be elicited.  The patient answers questions appropriately, but then intermittently perseverates on a response, answering the next few questions with that response, such as the year of 2021 for that actual question as well as several more questions. After a period of rest, she begins answering appropriately again, and then perseverates on a response again.    We discussed her HPOA papers and living will which are on record in [place]. The form has a Sports coach and lists her daughter Caren Griffins as 1st decision maker if she is unable to make decisions and then her son [name] after that.   The papers have every blank for an initial completed with  initials, and thus are initialed such that her HPOA's can not make a decision to withhold artificial hydration or nutrition (either to obtain or terminate treatment). Each box for limitations is initialed, but there are no limitations listed. The form specifically states "do not initial unless you insert a limitation." This is a notarized document.    Patient is made aware of this and states she does not want to speak with chaplain about redoing forms, and wants her pastor to do it. She states " I don't want to be left on earth". When asked what this means she stated "I want to die."     SUMMARY OF RECOMMENDATIONS   Would recommend an evaluation for capacity. Would then recommend additional HPOA/living will document if patient is deemed to have capacity. If patient does not have capacity, the documents are completed that the HPOA's can not make a decision to withhold artificial hydration or nutrition.    Prognosis:   Poor overall  Discharge Planning: Unable to determine     Primary Diagnoses: Present on Admission: . Sepsis ([place])   I have reviewed the medical record, interviewed the patient and family,  and examined the patient. The following aspects are pertinent.  Past Medical History:  Diagnosis Date  . Anemia 2012   from labs at [facility]  . Back pain    s/p hemilaminectomy with h/o herniated disc at L4L5  . Blood clot in vein    behind left knee  . BP (high blood pressure) 03/08/2014   Last Assessment & Plan:  Blood pressure has been controlled without significant dizzyness or associated fatigue. Taking antihypertensives as directed without difficulty.    Marland Kitchen CAD (coronary artery disease) 10/18/2011  . Chronic diastolic heart failure ([place]) 09/07/2014   Last Assessment & Plan:  Edema and sob seemingly baseline   . Collagen vascular disease ([place])   . Depression    after husband died  . DVT (deep venous thrombosis) ([place]) 05/14/2012  . Essential thrombocytosis ([place])   . Hyperlipidemia    . 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[place]) 03/05/2015  . Hypotension 03/26/2013  . IBS (irritable bowel syndrome)   . Insomnia   . Lupus anticoagulant positive   . Macular degeneration   . MS (multiple sclerosis) ([place])   . Multiple sclerosis ([place]) 04/29/2008   Per neurology at [facility]    . NEUROPATHY 04/29/2008   Per [facility]rology at [facility]    . RLS (restless legs syndrome)   . Spinal stenosis    lumbar  . Urinary retention    with high post void residuals 2013, per [facility]   Social History   Socioeconomic History  . Marital status: Widowed    Spouse name: Not on file  . Number of children: 2  . Years of education: Not on file  . Highest education level: Not on file  Occupational History    Employer: Retired  Tobacco Use  . Smoking status: Never Smoker  . Smokeless tobacco: Never Used  Substance and Sexual Activity  . Alcohol use: No  . Drug use: No  . Sexual activity: Not on file  Other Topics Concern  . Not on file  Social History Narrative  . Not on file   Social Determinants of Health   Financial Resource Strain:   . Difficulty of Paying Living Expenses:   Food Insecurity:   . Worried About Charity fundraiser in the Last Year:   . Arboriculturist in the Last Year:   Transportation Needs:   . Film/video editor (Medical):   Marland Kitchen Lack of Transportation (Non-Medical):   Physical Activity:   . Days of Exercise per Week:   . Minutes of Exercise per Session:   Stress:   . Feeling of Stress :   Social Connections:   . Frequency of Communication with Friends and Family:   . Frequency of Social Gatherings with Friends and Family:   . Attends Religious Services:   . Active Member of Clubs or Organizations:   . Attends Archivist Meetings:   Marland Kitchen Marital Status:    Family History  Problem Relation Age of Onset  . Heart disease Mother   . Thyroid cancer Mother   . Ovarian cancer Mother   . Alcohol abuse Father   . Heart disease Father   . Diabetes Sister    . Diabetes Brother   . Multiple sclerosis Paternal Aunt   . Multiple sclerosis Cousin    Scheduled Meds: . baclofen  20 mg Oral TID  . diclofenac sodium  4 g Topical QID  . escitalopram  20 mg Oral Daily  . famotidine  20 mg  Oral BID  . feeding supplement (ENSURE ENLIVE)  237 mL Oral BID BM  . furosemide  20 mg Intravenous Once  . gabapentin  600 mg Oral QID  . guaiFENesin  600 mg Oral BID  . hydrocortisone sod succinate (SOLU-CORTEF) inj  50 mg Intravenous Q8H  . hydroxyurea  500 mg Oral Q MTWThF  . ipratropium-albuterol  3 mL Nebulization BID  . loratadine  10 mg Oral Daily  . magnesium oxide  400 mg Oral BID AC & HS  . midodrine  10 mg Oral TID WC  . multivitamin with minerals  1 tablet Oral Daily  . oxyCODONE  5 mg Oral Q6H  . sodium chloride flush  3 mL Intravenous Q12H  . tiZANidine  4 mg Oral TID  . traZODone  75 mg Oral QHS   Continuous Infusions: . sodium chloride 250 mL (05/25/20 0914)  . albumin human 12.5 g (05/26/20 1053)  . azithromycin 500 mg (05/25/20 1732)  . cefTRIAXone (ROCEPHIN)  IV 1 g (05/25/20 1526)  . sodium chloride     PRN Meds:.sodium chloride, acetaminophen **OR** acetaminophen, magnesium hydroxide, metoCLOPramide, ondansetron **OR** ondansetron (ZOFRAN) IV, oxyCODONE, sodium chloride Medications Prior to Admission:  Prior to Admission medications   Medication Sig Start Date End Date Taking? Authorizing Provider  acetaminophen (TYLENOL) 325 MG tablet Take 650 mg by mouth every 4 (four) hours as needed. Give 2 tablets by mouth every 4 hours prn for general discomfort. Do not exceed 3 grams of APAP/day from all sources   Yes [provider]  baclofen (LIORESAL) 20 MG tablet Take 20 mg by mouth 3 (three) times daily. 05/06/20  Yes [provider]  diclofenac sodium (VOLTAREN) 1 % GEL Apply 4 g topically in the morning and at bedtime. Apply to left shoulder and left knee   Yes [provider]  escitalopram (LEXAPRO) 20 MG  tablet Take 20 mg by mouth daily.    Yes [provider]  fluticasone (FLONASE) 50 MCG/ACT nasal spray Place 1 spray into both nostrils daily.   Yes [provider]  gabapentin (NEURONTIN) 600 MG tablet Take 600 mg by mouth in the morning, at noon, in the evening, and at bedtime.   Yes [provider]  hydroxyurea (HYDREA) 500 MG capsule Take one by mouth Monday, Tuesday, Wed, Thursday, Friday Patient taking differently: Take 500 mg by mouth every Monday, Tuesday, Wednesday, Thursday, and Friday.  05/10/13  Yes Tonia Ghent, MD  loratadine (CLARITIN) 10 MG tablet Take 10 mg by mouth daily.   Yes [provider]  Magnesium 500 MG CAPS Take 1 capsule (500 mg total) by mouth 2 (two) times daily at 8 am and 10 pm. 12/17/18 05/19/20 Yes Milinda Pointer, MD  metoCLOPramide (REGLAN) 5 MG tablet Take 5 mg by mouth. 1 tablet by mouth before meals and at bedtime.   Yes [provider]  Multiple Vitamin (MULTIVITAMIN) tablet Take 1 tablet by mouth daily.   Yes [provider]  oxyCODONE (OXY IR/ROXICODONE) 5 MG immediate release tablet Take 1 tablet (5 mg total) by mouth every 6 (six) hours as needed for up to 30 days for severe pain. Must Last 30 days. 02/15/19 05/19/20 Yes Milinda Pointer, MD  rOPINIRole (REQUIP) 2 MG tablet Take 2 mg by mouth 4 (four) times daily.   Yes [provider]  tamsulosin (FLOMAX) 0.4 MG CAPS capsule Take 0.4 mg by mouth daily.   Yes [provider]  tiZANidine (ZANAFLEX) 4 MG tablet  Take 1 tablet (4 mg total) by mouth every 8 (eight) hours as needed for muscle spasms. Patient taking differently: Take 4 mg by mouth in the morning, at noon, and at bedtime.  12/17/18 05/19/20 Yes Milinda Pointer, MD  traZODone (DESYREL) 50 MG tablet Take 75 mg by mouth at bedtime.  12/24/18  Yes [provider]  XARELTO 20 MG TABS tablet Take 20 mg by mouth daily with breakfast.  09/07/15  Yes [provider]   AMPYRA 10 MG TB12 Take 10 mg by mouth 2 (two) times daily.  Patient not taking: Reported on 05/19/2020 09/11/12   [provider]  oxyCODONE (OXY IR/ROXICODONE) 5 MG immediate release tablet Take 1 tablet (5 mg total) by mouth every 6 (six) hours as needed for up to 30 days for severe pain. Must last 30 days. Patient taking differently: Take 5 mg by mouth every 6 (six) hours. Must last 30 days. 01/16/19 02/15/19  Milinda Pointer, MD  oxyCODONE (OXY IR/ROXICODONE) 5 MG immediate release tablet Take 1 tablet (5 mg total) by mouth every 6 (six) hours as needed for up to 30 days for severe pain. Must last 30 days. 12/17/18 01/16/19  Milinda Pointer, MD   Allergies  Allergen Reactions  . Atorvastatin     Muscle aches at >40mg     Review of Systems  Musculoskeletal: Positive for back pain.    Physical Exam Pulmonary:     Effort: Pulmonary effort is normal.  Skin:    General: Skin is warm and dry.  Neurological:     Mental Status: She is alert.     Vital Signs: BP 104/72 (BP Location: Right Arm)   Pulse (!) 56   Temp 98.8 F (37.1 C) (Oral)   Resp 18   Ht 5\' 5"  (1.651 m)   Wt 52.9 kg   SpO2 95%   BMI 19.41 kg/m  Pain Scale: Faces POSS *See Group Information*: S-Acceptable,Sleep, easy to arouse Pain Score: 0-No pain   SpO2: SpO2: 95 % O2 Device:SpO2: 95 % O2 Flow Rate: .O2 Flow Rate (L/min): 2 L/min  IO: Intake/output summary:   Intake/Output Summary (Last 24 hours) at 05/26/2020 1410 Last data filed at 05/26/2020 0559 Gross per 24 hour  Intake 442.59 ml  Output 300 ml  Net 142.59 ml    LBM: Last BM Date: 05/24/20 Baseline Weight: Weight: 56.7 kg Most recent weight: Weight: 52.9 kg     Palliative Assessment/Data: 20%     Time In: 2:05 Time Out: 2:35 Time Total: 30 min Greater than 50%  of this time was spent counseling and coordinating care related to the above assessment and plan.  Signed by: Asencion Gowda, NP   Please contact Palliative  Medicine Team phone at [phone] for questions and concerns.  For individual provider: See Shea Evans

## 2020-05-26 NOTE — Progress Notes (Signed)
PROGRESS NOTE    Cynthia Price  VZD:[id number] DOB: 1946-01-23 DOA: 05/18/2020 PCP: [name], MD   Brief Narrative:  HPI on 05/19/2020 by Dr. Eugenie Norrie Cynthia Price  is a 74 y.o. female with a known history of multiple sclerosis, DVT on Xarelto, depression, coronary artery disease, IBS, chronic anemia and chronic diastolic CHF who comes from her skilled nursing facility with acute onset of fever for the last couple of days for which she was given Tylenol and was noted to have hypotension that prompted 911 call.  Upon arrival EMS her blood pressure was 60/44 for which she was given IV fluid bolus and blood pressure was up to the 80s in the ER.  No history could be obtained from the patient due to her altered mental status and likely underlying dementia.  Interim history Patient admitted with sepsis secondary to UTI and possibly pneumonia, currently placed on IV antibiotics.  Also noted to have anemia, Xarelto held as FOBT was positive, GI consulted.  Overnight, patient became febrile with tachycardia and hypoxia, repeat chest x-ray was obtained.  Pulmonary consulted. S/p thoracentesis. Assessment & Plan   Sepsis secondary to UTI/pneumonia with pleural effusions -Patient presented with fever, hypotension, tachycardia -Overnight, patient became febrile along with tachycardia and hypoxia -Repeat chest x-ray on 05/21/2020: Reviewed and shows worsening of left-sided pleural effusion and pneumonia -UA: Trace leukocytes, negative nitrites, many bacteria, > 50 WBC -Urine culture shows multiple species, Recollection suggested -Blood culture showed no growth to date -Given fever, was placed on cefepime. Given the mild encephalopathy, changed to ceftriaxone -Continue azithromycin (MRSA PCR was negative) -Pulmonology consulted and appreciated -CT chest: Moderate bilateral pleural effusions with bibasilar airspace disease greatest on the left with volume loss and shift of mediastinal structures  from right to left into the left chest.  Small pericardial effusion. -IR consulted and appreciated, status post ultrasound-guided right thoracentesis yielding 550 mL of fluid -Fluid culture shows no growth to date -Fluid appears transudative  Acute hypoxic respiratory failure -Likely multifactorial including pleural effusions and pneumonia -Continue supplemental oxygen to maintain saturations above 90% (patient has been weaned down to 1 to 2 L from 4 L previously) -s/p thoracentesis -Continue treatment plan as above -Pulmonology planning for 20 mg of IV Lasix today   Acute on chronic macrocytic anemia/ GI Bleed -Hemoglobin had dropped to 6.6 -Baseline hemoglobin between 9 and 10 -Patient transfused 1 unit PRBC on 05/20/2020, hemoglobin currently 9 -Xarelto currently held -Anemia panel obtained, patient with adequate iron and iron storage.  TIBC 133, folate 16.8 -FOBT positive -Gastroenterology consulted and appreciated- and has signed off -Given that hemoglobin is currently stable, will need to consider restarting Xarelto during hospitalization   Hypotension -Patient was given IV albumin with IV fluids -BP has stabilized, continue to monitor closely -Continue midodrine -Blood pressures have been more stable and normotensive -ACTH stim test done -AM cortisol WNL -continue solucortef- possibly change to florinef in the next day  Acute metabolic encephalopathy -Possibly secondary to cefepime -Transitioned to ceftriaxone -Will continue to monitor closely -Patient voiced to me that she was not feeling like herself this morning.  ?  Aspiration/dysphagia -Speech therapy consulted, recommending dysphagia 1 diet with thin liquids  Multiple sclerosis -Continue baclofen, Zanaflex  Depression -Continue Cymbalta  History of DVT -As above, Xarelto held  T11 compression -Noted on CT chest -Continue supportive care  Malnutrition, moderate -Nutrition consulted-continue  supplements -Albumin 2.7 -given albumin  DVT Prophylaxis  SCDs  Code Status: DNR  Family  Communication: None at bedside  Disposition Plan:  Status is: Inpatient  Remains inpatient appropriate because:Hemodynamically unstable, Ongoing diagnostic testing needed not appropriate for outpatient work up and IV treatments appropriate due to intensity of illness or inability to take PO   Dispo: The patient is from: SNF              Anticipated d/c is to: SNF              Anticipated d/c date is: > 3 days              Patient currently is not medically stable to d/c.   Consultants Gastroenterology Pulmonology Interventional radiology  Procedures  Ultrasound guided right thoracentesis  Antibiotics   Anti-infectives (From admission, onward)   Start     Dose/Rate Route Frequency Ordered Stop   05/25/20 1500  cefTRIAXone (ROCEPHIN) 1 g in sodium chloride 0.9 % 100 mL IVPB     Discontinue     1 g 200 mL/hr over 30 Minutes Intravenous Every 24 hours 05/25/20 1409     05/23/20 1700  azithromycin (ZITHROMAX) 500 mg in sodium chloride 0.9 % 250 mL IVPB     Discontinue     500 mg 250 mL/hr over 60 Minutes Intravenous Every 24 hours 05/23/20 1658     05/21/20 1200  ceFEPIme (MAXIPIME) 2 g in sodium chloride 0.9 % 100 mL IVPB  Status:  Discontinued        2 g 200 mL/hr over 30 Minutes Intravenous Every 12 hours 05/21/20 1121 05/25/20 1409   05/21/20 1000  azithromycin (ZITHROMAX) tablet 500 mg        500 mg Oral  Once 05/20/20 0918 05/21/20 0913   05/19/20 0600  cefTRIAXone (ROCEPHIN) 2 g in sodium chloride 0.9 % 100 mL IVPB  Status:  Discontinued        2 g 200 mL/hr over 30 Minutes Intravenous Every 24 hours 05/19/20 0451 05/21/20 1121   05/19/20 0600  azithromycin (ZITHROMAX) 500 mg in sodium chloride 0.9 % 250 mL IVPB  Status:  Discontinued        500 mg 250 mL/hr over 60 Minutes Intravenous Every 24 hours 05/19/20 0451 05/20/20 0918   05/19/20 0045  ceFEPIme (MAXIPIME) 1 g in sodium  chloride 0.9 % 100 mL IVPB        1 g 200 mL/hr over 30 Minutes Intravenous  Once 05/19/20 0036 05/19/20 0125   05/19/20 0045  vancomycin (VANCOCIN) IVPB 1000 mg/200 mL premix        1,000 mg 200 mL/hr over 60 Minutes Intravenous  Once 05/19/20 0036 05/19/20 0230      Subjective:   [name] seen and examined today.  History of dementia.  Complains of shoulder pain today.  Feels it is due to her positioning.  Currently denies headache or dizziness.  Denies nausea, vomiting, abdominal pain, chest pain, dizziness, headache.  Feels breathing is better. Objective:   Vitals:   05/26/20 0500 05/26/20 0759 05/26/20 0801 05/26/20 1210  BP: 121/81 110/66  104/72  Pulse: 75 67  (!) 56  Resp:  16  18  Temp: 98.8 F (37.1 C) 98.8 F (37.1 C)    TempSrc: Oral Oral    SpO2: 97% 98% 94% 95%  Weight: 52.9 kg     Height:        Intake/Output Summary (Last 24 hours) at 05/26/2020 1336 Last data filed at 05/26/2020 0559 Gross per 24 hour  Intake 442.59 ml  Output 300 ml  Net 142.59 ml   Filed Weights   05/22/20 0442 05/25/20 0444 05/26/20 0500  Weight: 51.8 kg 52.5 kg 52.9 kg   Exam  General: Well developed, chronically ill-appearing  HEENT: NCAT,  mucous membranes moist.   Neck: Supple, no JVD, no masses  Cardiovascular: S1 S2 auscultated, RRR  Respiratory: Diminished breath sounds, L>R  Abdomen: Soft, nontender, nondistended, + bowel sounds  Extremities: warm dry without cyanosis clubbing.  LUE edema, mildly improving  MSK: Kyphotic  Neuro: AAOx2, dementia, nonfocal  Psych: Does not, appropriate mood and affect   Data Reviewed: I have personally reviewed following labs and imaging studies  CBC: Recent Labs  Lab 05/21/20 0636 05/21/20 0636 05/22/20 0554 05/23/20 0316 05/24/20 0534 05/25/20 0558 05/26/20 0455  WBC 10.1  --  10.2 10.8* 12.3* 8.7  --   HGB 9.5*   < > 9.7* 10.2* 10.1* 9.3* 9.0*  HCT 29.6*   < > 29.4* 31.9* 31.9* 29.1* 27.9*  MCV 97.4  --  96.4  97.3 97.0 99.0  --   PLT 344  --  340 366 378 391  --    < > = values in this interval not displayed.   Basic Metabolic Panel: Recent Labs  Lab 05/21/20 0636 05/22/20 0554 05/23/20 0316 05/24/20 0534 05/25/20 0558  NA 141 143 139 142 141  K 3.9 4.0 3.6 3.6 4.2  CL 108 110 104 105 105  CO2 26 24 25 29 27   GLUCOSE 105* 84 98 87 166*  BUN 11 15 18 20  25*  CREATININE 0.64 0.65 0.66 0.66 0.69  CALCIUM 8.1* 8.4* 8.4* 8.5* 8.7*   GFR: Estimated Creatinine Clearance: 51.5 mL/min (by C-G formula based on SCr of 0.69 mg/dL). Liver Function Tests: No results for input(s): AST, ALT, ALKPHOS, BILITOT, PROT, ALBUMIN in the last 168 hours. No results for input(s): LIPASE, AMYLASE in the last 168 hours. No results for input(s): AMMONIA in the last 168 hours. Coagulation Profile: No results for input(s): INR, PROTIME in the last 168 hours. Cardiac Enzymes: No results for input(s): CKTOTAL, CKMB, CKMBINDEX, TROPONINI in the last 168 hours. BNP (last 3 results) No results for input(s): PROBNP in the last 8760 hours. HbA1C: No results for input(s): HGBA1C in the last 72 hours. CBG: Recent Labs  Lab 05/24/20 0614  GLUCAP 85   Lipid Profile: No results for input(s): CHOL, HDL, LDLCALC, TRIG, CHOLHDL, LDLDIRECT in the last 72 hours. Thyroid Function Tests: No results for input(s): TSH, T4TOTAL, FREET4, T3FREE, THYROIDAB in the last 72 hours. Anemia Panel: No results for input(s): VITAMINB12, FOLATE, FERRITIN, TIBC, IRON, RETICCTPCT in the last 72 hours. Urine analysis:    Component Value Date/Time   COLORURINE AMBER (A) 05/19/2020 0010   APPEARANCEUR TURBID (A) 05/19/2020 0010   APPEARANCEUR Clear 09/25/2015 1020   LABSPEC 1.016 05/19/2020 0010   LABSPEC 1.011 11/02/2014 1914   PHURINE 8.0 05/19/2020 0010   GLUCOSEU NEGATIVE 05/19/2020 0010   GLUCOSEU Negative 11/02/2014 1914   HGBUR NEGATIVE 05/19/2020 0010   HGBUR trace-intact 04/29/2010 0952   BILIRUBINUR NEGATIVE 05/19/2020  0010   BILIRUBINUR Negative 09/25/2015 1020   BILIRUBINUR Negative 11/02/2014 1914   KETONESUR 5 (A) 05/19/2020 0010   PROTEINUR 100 (A) 05/19/2020 0010   UROBILINOGEN negative 02/04/2014 1603   UROBILINOGEN 0.2 04/29/2010 0952   NITRITE NEGATIVE 05/19/2020 0010   LEUKOCYTESUR TRACE (A) 05/19/2020 0010   LEUKOCYTESUR Negative 11/02/2014 1914   Sepsis Labs: @LABRCNTIP (procalcitonin:4,lacticidven:4)  ) Recent Results (from the past  240 hour(s))  Blood culture (routine x 2)     Status: None   Collection Time: 05/19/20 12:10 AM   Specimen: Right Antecubital; Blood  Result Value Ref Range Status   Specimen Description RIGHT ANTECUBITAL  Final   Special Requests   Final    BOTTLES DRAWN AEROBIC AND ANAEROBIC Blood Culture results may not be optimal due to an excessive volume of blood received in culture bottles   Culture   Final    NO GROWTH 5 DAYS Performed at [facility], [address] [place], [place] [address]    Report Status 05/24/2020 FINAL  Final  Respiratory Panel by RT PCR (Flu A&B, Covid) - Nasopharyngeal Swab     Status: None   Collection Time: 05/19/20 12:10 AM   Specimen: Nasopharyngeal Swab  Result Value Ref Range Status   SARS Coronavirus 2 by RT PCR NEGATIVE NEGATIVE Final    Comment: (NOTE) SARS-CoV-2 target nucleic acids are NOT DETECTED.  The SARS-CoV-2 RNA is generally detectable in upper respiratoy specimens during the acute phase of infection. The lowest concentration of SARS-CoV-2 viral copies this assay can detect is 131 copies/mL. A negative result does not preclude SARS-Cov-2 infection and should not be used as the sole basis for treatment or other patient management decisions. A negative result may occur with  improper specimen collection/handling, submission of specimen other than nasopharyngeal swab, presence of viral mutation(s) within the areas targeted by this assay, and inadequate number of viral copies (<131 copies/mL). A negative  result must be combined with clinical observations, patient history, and epidemiological information. The expected result is Negative.  Fact Sheet for Patients:  PinkCheek.be  Fact Sheet for Healthcare Providers:  GravelBags.it  This test is no t yet approved or cleared by the [place] FDA and  has been authorized for detection and/or diagnosis of SARS-CoV-2 by FDA under an Emergency Use Authorization (EUA). This EUA will remain  in effect (meaning this test can be used) for the duration of the COVID-19 declaration under Section 564(b)(1) of the Act, 21 U.S.C. section 360bbb-3(b)(1), unless the authorization is terminated or revoked sooner.     Influenza A by PCR NEGATIVE NEGATIVE Final   Influenza B by PCR NEGATIVE NEGATIVE Final    Comment: (NOTE) The Xpert Xpress SARS-CoV-2/FLU/RSV assay is intended as an aid in  the diagnosis of influenza from Nasopharyngeal swab specimens and  should not be used as a sole basis for treatment. Nasal washings and  aspirates are unacceptable for Xpert Xpress SARS-CoV-2/FLU/RSV  testing.  Fact Sheet for Patients: PinkCheek.be  Fact Sheet for Healthcare Providers: GravelBags.it  This test is not yet approved or cleared by the [place] FDA and  has been authorized for detection and/or diagnosis of SARS-CoV-2 by  FDA under an Emergency Use Authorization (EUA). This EUA will remain  in effect (meaning this test can be used) for the duration of the  Covid-19 declaration under Section 564(b)(1) of the Act, 21  U.S.C. section 360bbb-3(b)(1), unless the authorization is  terminated or revoked. Performed at [facility], [address] [place], [place] [address]   Urine culture     Status: Abnormal   Collection Time: 05/19/20 12:11 AM   Specimen: Urine, Clean Catch  Result Value Ref Range Status   Specimen  Description   Final    URINE, CLEAN CATCH Performed at [facility], [address] [place], [place] [address]    Special Requests   Final  NONE Performed at [facility], [address] [place], [place] [address]    Culture MULTIPLE SPECIES PRESENT, SUGGEST RECOLLECTION (A)  Final   Report Status 05/20/2020 FINAL  Final  Blood culture (routine x 2)     Status: None   Collection Time: 05/19/20 12:27 AM   Specimen: BLOOD RIGHT HAND  Result Value Ref Range Status   Specimen Description BLOOD RIGHT HAND  Final   Special Requests   Final    BOTTLES DRAWN AEROBIC AND ANAEROBIC Blood Culture adequate volume   Culture   Final    NO GROWTH 5 DAYS Performed at [facility], [address] [place], [place] [address]    Report Status 05/24/2020 FINAL  Final  MRSA PCR Screening     Status: None   Collection Time: 05/19/20  6:31 PM   Specimen: Nasopharyngeal  Result Value Ref Range Status   MRSA by PCR NEGATIVE NEGATIVE Final    Comment:        The GeneXpert MRSA Assay (FDA approved for NASAL specimens only), is one component of a comprehensive MRSA colonization surveillance program. It is not intended to diagnose MRSA infection nor to guide or monitor treatment for MRSA infections. Performed at [facility], [address] [place], [place] [address]   Body fluid culture     Status: None   Collection Time: 05/21/20  4:38 PM   Specimen: PATH Cytology Pleural fluid  Result Value Ref Range Status   Specimen Description   Final    PLEURAL Performed at [facility], [address] [place], [place] [address]    Special Requests NONE  Final   Gram Stain   Final    FEW WBC PRESENT, PREDOMINANTLY MONONUCLEAR NO ORGANISMS SEEN    Culture   Final    NO GROWTH Performed at [facility], [place] [address], [place], [place] [address]    Report Status 05/25/2020 FINAL  Final  Acid Fast Smear (AFB)     Status: None   Collection  Time: 05/21/20  4:38 PM   Specimen: PATH Cytology Pleural fluid  Result Value Ref Range Status   AFB Specimen Processing Concentration  Final   Acid Fast Smear Negative  Final    Comment: (NOTE) Performed At: [facility] [address] [place], [place] [id number] Rush Farmer MD HO:[id number]    Source (AFB) PLEURAL  Final    Comment: Performed at [facility], [address], [place],  [address]      Radiology Studies: No results found.   Scheduled Meds: . baclofen  20 mg Oral TID  . diclofenac sodium  4 g Topical QID  . escitalopram  20 mg Oral Daily  . famotidine  20 mg Oral BID  . feeding supplement (ENSURE ENLIVE)  237 mL Oral BID BM  . furosemide  20 mg Intravenous Once  . gabapentin  600 mg Oral QID  . guaiFENesin  600 mg Oral BID  . hydrocortisone sod succinate (SOLU-CORTEF) inj  50 mg Intravenous Q8H  . hydroxyurea  500 mg Oral Q MTWThF  . ipratropium-albuterol  3 mL Nebulization BID  . loratadine  10 mg Oral Daily  . magnesium oxide  400 mg Oral BID AC & HS  . midodrine  10 mg Oral TID WC  . multivitamin with minerals  1 tablet Oral Daily  . oxyCODONE  5 mg Oral Q6H  . sodium chloride flush  3 mL Intravenous Q12H  .  tiZANidine  4 mg Oral TID  . traZODone  75 mg Oral QHS   Continuous Infusions: . sodium chloride 250 mL (05/25/20 0914)  . albumin human 12.5 g (05/26/20 1053)  . azithromycin 500 mg (05/25/20 1732)  . cefTRIAXone (ROCEPHIN)  IV 1 g (05/25/20 1526)  . sodium chloride       LOS: 7 days   Time Spent in minutes   30 minutes  Akera Snowberger D.O. on 05/26/2020 at 1:36 PM  Between 7am to 7pm - Please see pager noted on [email or web address]  After 7pm go to [email or web address]  And look for the night coverage person covering for me after hours  [facility] Office  [phone]

## 2020-05-26 NOTE — Progress Notes (Signed)
Pulmonary Medicine          Date: 05/26/2020,   MRN# [id number] Cynthia Price 10-01-1946     AdmissionWeight: 56.7 kg                 CurrentWeight: 52.9 kg   Referring physician: Dr Ree Kida   CHIEF COMPLAINT:   Loculated pleural effusion concerning for empyema   SUBJECTIVE   Patient resting in bed she is awake in no distress and communicates appropriately.    PAST MEDICAL HISTORY   Past Medical History:  Diagnosis Date  . Anemia 2012   from labs at [facility]  . Back pain    s/p hemilaminectomy with h/o herniated disc at L4L5  . Blood clot in vein    behind left knee  . BP (high blood pressure) 03/08/2014   Last Assessment & Plan:  Blood pressure has been controlled without significant dizzyness or associated fatigue. Taking antihypertensives as directed without difficulty.    Marland Kitchen CAD (coronary artery disease) 10/18/2011  . Chronic diastolic heart failure ([place]) 09/07/2014   Last Assessment & Plan:  Edema and sob seemingly baseline   . Collagen vascular disease ([place])   . Depression    after husband died  . DVT (deep venous thrombosis) ([place]) 05/14/2012  . Essential thrombocytosis ([place])   . Hyperlipidemia   . 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[place]) 03/05/2015  . Hypotension 03/26/2013  . IBS (irritable bowel syndrome)   . Insomnia   . Lupus anticoagulant positive   . Macular degeneration   . MS (multiple sclerosis) ([place])   . Multiple sclerosis ([place]) 04/29/2008   Per [facility]y [facility]    . NEUROPATHY 04/29/2008   Per [facility]rology at [facility]    . RLS (restless legs syndrome)   . Spinal stenosis    lumbar  . Urinary retention    with high post void residuals 2013, per [facility] u[facility]     SURGICAL HISTORY   Past Surgical History:  Procedure Laterality Date  . ANTERIOR LUMBAR FUSION  2012  . APPENDECTOMY    . CHOLECYSTECTOMY    . CHOLECYSTECTOMY    . LUMBAR DISC SURGERY    . LUMBAR LAMINECTOMY    . TONSILLECTOMY       FAMILY HISTORY   Family History    Problem Relation Age of Onset  . Heart disease Mother   . Thyroid cancer Mother   . Ovarian cancer Mother   . Alcohol abuse Father   . Heart disease Father   . Diabetes Sister   . Diabetes Brother   . Multiple sclerosis Paternal Aunt   . Multiple sclerosis Cousin      SOCIAL HISTORY   Social History   Tobacco Use  . Smoking status: Never Smoker  . Smokeless tobacco: Never Used  Substance Use Topics  . Alcohol use: No  . Drug use: No     MEDICATIONS    Home Medication:    Current Medication:  Current Facility-Administered Medications:  .  0.9 %  sodium chloride infusion, , Intravenous, PRN, Cristal Ford, DO, Last Rate: 10 mL/hr at 05/25/20 0914, 250 mL at 05/25/20 0914 .  acetaminophen (TYLENOL) tablet 650 mg, 650 mg, Oral, Q6H PRN, 650 mg at 05/23/20 1419 **OR** acetaminophen (TYLENOL) suppository 650 mg, 650 mg, Rectal, Q6H PRN, Oswald Hillock, RPH .  albumin human 25 % solution 12.5 g, 12.5 g, Intravenous, Daily, Cathy Crounse, MD, Last Rate: 60 mL/hr at 05/26/20 1053, 12.5 g at  05/26/20 1053 .  azithromycin (ZITHROMAX) 500 mg in sodium chloride 0.9 % 250 mL IVPB, 500 mg, Intravenous, Q24H, Mikhail, [place], DO, Last Rate: 250 mL/hr at 05/25/20 1732, 500 mg at 05/25/20 1732 .  baclofen (LIORESAL) tablet 20 mg, 20 mg, Oral, TID, Mansy, Jan A, MD, 20 mg at 05/26/20 1025 .  cefTRIAXone (ROCEPHIN) 1 g in sodium chloride 0.9 % 100 mL IVPB, 1 g, Intravenous, Q24H, Luisantonio Adinolfi, MD, Last Rate: 200 mL/hr at 05/25/20 1526, 1 g at 05/25/20 1526 .  diclofenac sodium (VOLTAREN) 1 % transdermal gel 4 g, 4 g, Topical, QID, Mansy, Jan A, MD, 4 g at 05/26/20 1000 .  escitalopram (LEXAPRO) tablet 20 mg, 20 mg, Oral, Daily, Mansy, Jan A, MD, 20 mg at 05/26/20 1025 .  famotidine (PEPCID) tablet 20 mg, 20 mg, Oral, BID, Mansy, Jan A, MD, 20 mg at 05/26/20 1024 .  feeding supplement (ENSURE ENLIVE) (ENSURE ENLIVE) liquid 237 mL, 237 mL, Oral, BID BM, Mikhail, Maryann, DO, 237 mL  at 05/26/20 1000 .  furosemide (LASIX) injection 20 mg, 20 mg, Intravenous, Once, Jayne Peckenpaugh, MD .  gabapentin (NEURONTIN) tablet 600 mg, 600 mg, Oral, QID, Mansy, Jan A, MD, 600 mg at 05/26/20 1023 .  guaiFENesin (MUCINEX) 12 hr tablet 600 mg, 600 mg, Oral, BID, Mansy, Jan A, MD, 600 mg at 05/26/20 1024 .  hydrocortisone sodium succinate (SOLU-CORTEF) 100 MG injection 50 mg, 50 mg, Intravenous, Q8H, Mikhail, Maryann, DO, 50 mg at 05/26/20 0800 .  hydroxyurea (HYDREA) capsule 500 mg, 500 mg, Oral, Q MTWThF, Mansy, Jan A, MD, 500 mg at 05/26/20 6045 .  ipratropium-albuterol (DUONEB) 0.5-2.5 (3) MG/3ML nebulizer solution 3 mL, 3 mL, Nebulization, BID, Mansy, Jan A, MD, 3 mL at 05/26/20 0801 .  loratadine (CLARITIN) tablet 10 mg, 10 mg, Oral, Daily, Mansy, Jan A, MD, 10 mg at 05/26/20 1023 .  magnesium hydroxide (MILK OF MAGNESIA) suspension 30 mL, 30 mL, Oral, Daily PRN, Mansy, Jan A, MD .  magnesium oxide (MAG-OX) tablet 400 mg, 400 mg, Oral, BID AC & HS, Mansy, Jan A, MD, 400 mg at 05/25/20 2235 .  metoCLOPramide (REGLAN) tablet 5 mg, 5 mg, Oral, Q6H PRN, Mansy, Jan A, MD, 5 mg at 05/23/20 1551 .  midodrine (PROAMATINE) tablet 10 mg, 10 mg, Oral, TID WC, Lanney Gins, Zyire Eidson, MD, 10 mg at 05/26/20 0846 .  multivitamin with minerals tablet 1 tablet, 1 tablet, Oral, Daily, Mansy, Jan A, MD, 1 tablet at 05/26/20 1024 .  ondansetron (ZOFRAN) tablet 4 mg, 4 mg, Oral, Q6H PRN **OR** ondansetron (ZOFRAN) injection 4 mg, 4 mg, Intravenous, Q6H PRN, Mansy, Jan A, MD, 4 mg at 05/23/20 4098 .  oxyCODONE (Oxy IR/ROXICODONE) immediate release tablet 5 mg, 5 mg, Oral, Q6H PRN, Mansy, Jan A, MD, 5 mg at 05/21/20 2139 .  oxyCODONE (Oxy IR/ROXICODONE) immediate release tablet 5 mg, 5 mg, Oral, Q6H, Mansy, Jan A, MD, 5 mg at 05/26/20 1024 .  sodium chloride (OCEAN) 0.65 % nasal spray 1 spray, 1 spray, Each Nare, PRN, Cristal Ford, DO, 1 spray at 05/23/20 2124 .  sodium chloride 0.9 % bolus 250 mL, 250 mL,  Intravenous, Once, Cristal Ford, DO .  sodium chloride flush (NS) 0.9 % injection 3 mL, 3 mL, Intravenous, Q12H, Mikhail, [place], DO, 3 mL at 05/25/20 0919 .  tiZANidine (ZANAFLEX) tablet 4 mg, 4 mg, Oral, TID, Mansy, Jan A, MD, 4 mg at 05/26/20 1025 .  traZODone (DESYREL) tablet 75 mg, 75 mg, Oral, QHS,  Mansy, Arvella Merles, MD, 75 mg at 05/25/20 2235    ALLERGIES   Atorvastatin     REVIEW OF SYSTEMS    Review of Systems:  Gen:  Denies  fever, sweats, chills weigh loss  HEENT: Denies blurred vision, double vision, ear pain, eye pain, hearing loss, nose bleeds, sore throat Cardiac:  No dizziness, chest pain or heaviness, chest tightness,edema Resp:   Denies cough or sputum porduction, shortness of breath,wheezing, hemoptysis,  Gi: Denies swallowing difficulty, stomach pain, nausea or vomiting, diarrhea, constipation, bowel incontinence Gu:  Denies bladder incontinence, burning urine Ext:   Denies Joint pain, stiffness or swelling Skin: Denies  skin rash, easy bruising or bleeding or hives Endoc:  Denies polyuria, polydipsia , polyphagia or weight change Psych:   Denies depression, insomnia or hallucinations   Other:  All other systems negative   VS: BP 110/66 (BP Location: Right Arm)   Pulse 67   Temp 98.8 F (37.1 C) (Oral)   Resp 16   Ht 5\' 5"  (1.651 m)   Wt 52.9 kg   SpO2 94%   BMI 19.41 kg/m      PHYSICAL EXAM    GENERAL:NAD, no fevers, chills, no weakness no fatigue HEAD: Normocephalic, atraumatic.  EYES: Pupils equal, round, reactive to light. Extraocular muscles intact. No scleral icterus.  MOUTH: Moist mucosal membrane. Dentition intact. No abscess noted.  EAR, NOSE, THROAT: Clear without exudates. No external lesions.  NECK: Supple. No thyromegaly. No nodules. No JVD.  PULMONARY: Decreased breath sounds bilaterally improved CARDIOVASCULAR: S1 and S2. Regular rate and rhythm. No murmurs, rubs, or gallops. No edema. Pedal pulses 2+ bilaterally.    GASTROINTESTINAL: Soft, nontender, nondistended. No masses. Positive bowel sounds. No hepatosplenomegaly.  MUSCULOSKELETAL: No swelling, clubbing, or edema. Range of motion full in all extremities.  NEUROLOGIC: Cranial nerves II through XII are intact. No gross focal neurological deficits. Sensation intact. Reflexes intact.  SKIN: No ulceration, lesions, rashes, or cyanosis. Skin warm and dry. Turgor intact.  PSYCHIATRIC: Mood, affect within normal limits. The patient is awake, alert and oriented x 3. Insight, judgment intact.       IMAGING    DG Chest 1 View  Addendum Date: 05/21/2020   ADDENDUM REPORT: 05/21/2020 06:03 ADDENDUM: Additional attempts were made to reposition the patient. With diminished patient rotation and less obscuration of the left upper lung. These images reveal increasing now completely confluent opacity in the left lung base with air bronchograms likely reflecting a combination of consolidated lung, volume loss and layering effusion. Increasing opacities are present throughout the right mid to lower lung as well which could reflect further airspace disease or edema as well as small right effusion. This addendum will be called to the ordering clinician or representative by the Radiologist Assistant, and communication documented in the PACS or [employer]. Electronically Signed   By: Lovena Le M.D.   On: 05/21/2020 06:03   Result Date: 05/21/2020 CLINICAL DATA:  Shortness of breath EXAM: CHEST  1 VIEW COMPARISON:  Radiograph 05/19/2020, CT 06/14/2015 FINDINGS: Difficulty with patient positioning with the head projecting over the left mid to upper lung obscuring much of the visible portions of the left lung itself. Additionally there is a steep right anterior obliquity. Redemonstration of some patchy and streaky right basilar opacity. More globally increased opacity in the retrocardiac space. Obscuration of the hemidiaphragms is suggestive of developing pleural  effusions. No visible right pneumothorax. Left apex obscured. Visible mediastinal contours are not significantly changed from prior though  much of the left heart border is obscured. No acute osseous or soft tissue abnormality. Chronic rib deformities are again seen. Telemetry leads overlie the chest. IMPRESSION: 1. Persistent right basilar airspace opacities with developing right effusion. 2. Globally increased opacity in the retrocardiac space, could reflect developing pleural effusion, atelectasis or pneumonia. 3. Marked patient rotation as well patient's head obscuring the left mid to upper lung, limiting evaluation. Electronically Signed: By: Lovena Le M.D. On: 05/21/2020 05:13   CT CHEST W CONTRAST  Result Date: 05/21/2020 CLINICAL DATA:  Respiratory failure history of multiple sclerosis and DVT on Xarelto. EXAM: CT CHEST WITH CONTRAST TECHNIQUE: Multidetector CT imaging of the chest was performed during intravenous contrast administration. CONTRAST:  76mL OMNIPAQUE IOHEXOL 300 MG/ML  SOLN COMPARISON:  06/13/2015 FINDINGS: Cardiovascular: Heart size is normal with small pericardial effusion. Central pulmonary vasculature is normal on venous phase assessment, study not protocol for PE evaluation. Aortic caliber is normal. Mediastinum/Nodes: No axillary adenopathy. Thoracic inlet structures are normal. Lungs/Pleura: Moderate bilateral pleural effusions. Basilar airspace disease bilaterally. Effusion greatest on the RIGHT. Airspace disease greatest on the LEFT with component of volume loss and shift of mediastinal structures from RIGHT to LEFT into the LEFT chest. Upper Abdomen: Incidental imaging of upper abdominal contents without acute process. Musculoskeletal: Chronic deformity of the sternum indicative of prior fracture. Evidence of spinal fusion at the upper portion of the lumbar spine, incompletely imaged on CT data. No acute bone finding. T11 compression with similar appearance. IMPRESSION: 1.  Moderate bilateral pleural effusions with bibasilar airspace disease greatest on the LEFT with component of volume loss and shift of mediastinal structures from RIGHT to LEFT into the LEFT chest. Much of this appears to represent volume loss though there is some areas of variable enhancement that raise the question of concomitant pneumonia particularly at the LEFT lung base. Loss of air bronchograms raising the question of aspiration though there is no material seen in the trachea or larger airways. 2. Small pericardial effusion. 3. Chronic deformity of the sternum indicative of prior fracture. 4. T11 compression with similar appearance. 5. Aortic atherosclerosis. Aortic Atherosclerosis (ICD10-I70.0). Electronically Signed   By: Zetta Bills M.D.   On: 05/21/2020 11:06   DG Chest Port 1 View  Result Date: 05/23/2020 CLINICAL DATA:  Per chart Patient admitted with sepsis secondary to UTI and possibly pneumonia. Hx of high blood pressure, chronic heart failure, hypotension. Non smoker. EXAM: PORTABLE CHEST 1 VIEW COMPARISON:  05/21/2020 FINDINGS: Left lung base opacity obscures the hemidiaphragm and left heart border, associated with air bronchograms. There is bilateral vascular congestion and interstitial thickening. Hazy opacity is noted at the right lung base, which appears increased from the previous exam. Cardiac silhouette is normal in size. No mediastinal or hilar masses. Small bilateral effusions, better appreciated on the CT from 05/21/2020. No pneumothorax. IMPRESSION: 1. Mild increase in hazy opacity at the right lung base compared to the most recent prior exam, which may reflect an increase in atelectasis, pneumonia or a combination. 2. No other change. 3. Persistent consolidation in the left lung base, which may reflect pneumonia or atelectasis. Bilateral vascular congestion and interstitial prominence, also unchanged, without overt pulmonary edema. Electronically Signed   By: Lajean Manes M.D.    On: 05/23/2020 15:42   DG Chest Port 1 View  Result Date: 05/21/2020 CLINICAL DATA:  Status post right-sided thoracentesis. EXAM: PORTABLE CHEST 1 VIEW COMPARISON:  May 21, 2020 (4:34 a.m.) FINDINGS: The study is limited secondary to limited  patient positioning. The lungs are hyperinflated. Mild to moderate severity diffuse chronic appearing increased interstitial lung markings are seen. There is opacification of the lower half of the left lung. No pneumothorax is identified. The cardiac silhouette is borderline in size. Multiple chronic left-sided rib fractures are seen. Degenerative changes seen throughout the thoracic spine. IMPRESSION: 1. Opacification of the lower half of the left lung consistent with moderate-sized pleural effusion and associated atelectasis versus infiltrate. 2. Underlying chronic interstitial lung disease. Electronically Signed   By: Virgina Norfolk M.D.   On: 05/21/2020 16:58   DG Chest Portable 1 View  Result Date: 05/19/2020 CLINICAL DATA:  Sepsis.  Hypotension.  Fever. EXAM: PORTABLE CHEST 1 VIEW COMPARISON:  08/19/2017 FINDINGS: Patient's chin obscures the apices. Significant patient rotation. Stable heart size and mediastinal contours. Streaky retrocardiac opacity. Patchy right lung base opacity. Possible small pleural effusions. No pneumothorax. No pulmonary edema. Bones under mineralized with scoliotic curvature of the spine. IMPRESSION: 1. Streaky retrocardiac opacity and patchy right lung base opacity, may be atelectasis or pneumonia. Possible small pleural effusions. 2. Rotated exam. Electronically Signed   By: Keith Rake M.D.   On: 05/19/2020 01:01   US THORACENTESIS ASP PLEURAL SPACE W/IMG GUIDE  Result Date: 05/21/2020 CLINICAL DATA:  Respiratory failure and bilateral pleural effusions, right greater than left. EXAM: ULTRASOUND GUIDED RIGHT THORACENTESIS COMPARISON:  None. PROCEDURE: An ultrasound guided thoracentesis was thoroughly discussed with the  patient and questions answered. The benefits, risks, alternatives and complications were also discussed. The patient understands and wishes to proceed with the procedure. Written consent was obtained. Ultrasound was performed to localize and mark an adequate pocket of fluid in the right chest. The area was then prepped and draped in the normal sterile fashion. 1% Lidocaine was used for local anesthesia. Under ultrasound guidance a 6 French Safe-T-Centesis catheter was introduced. Thoracentesis was performed. The catheter was removed and a dressing applied. COMPLICATIONS: None FINDINGS: A total of approximately 550 mL of clear, yellow fluid was removed. A fluid sample was sent for laboratory analysis. IMPRESSION: Successful ultrasound guided right thoracentesis yielding 550 mL of pleural fluid. Electronically Signed   By: Aletta Edouard M.D.   On: 05/21/2020 17:00            ASSESSMENT/PLAN   Acute hypoxemic respiratory failure likley due to bilateral compressive atelectasis  -present on admission   - patient with febrile illness and left lung infiltrate possible CAP  -procalcitonin with only mild elevation -will trend - thoracentesis performed and patient improved with now only 1-2L/min [place] - continue rocephin/zithromax - for total 7 days. Patient will be here 8 days tommorow   Bilateral pleural effusions - Reviewed fluid profile- lymphocyte predominant transudate suggestive of CHF vs MS related fluid -will attempt diuresis gently post improved BP -Thoracentesis studies are still peding -repeat CXR with itnerval improvement -will try diuresing again today - once time dose 20 lasix ordered - BP is stable    BiIateral compressive atelectasis -will drain fluid and diurese -recruitment manuevers with Metaneb therapy utilising saline  -albuterol nebulizer -patient unable to use IS/Flutter due to MS hx   Moderate protein calorie malnutrition   - hindering diuresis and may be cause of  effusion   - will replete Albumin and consult RD nutritional consult    Circulatory shock-improved   - hindering ability to diurese- continue midodrine -present on admission - likely related to sepsis secondary to UTI vs CAP - resolved with now transient hypotension  -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   Patient/Family are satisfied with care plan and all questions have been answered.   This document was prepared using Dragon voice recognition software and may include unintentional dictation errors.     Ottie Glazier, M.D.  Division of [place]

## 2020-05-27 DIAGNOSIS — J690 Pneumonitis due to inhalation of food and vomit: Secondary | ICD-10-CM

## 2020-05-27 LAB — CHOLESTEROL, BODY FLUID: Cholesterol, Fluid: 25 mg/dL

## 2020-05-27 LAB — BASIC METABOLIC PANEL
Anion gap: 7 (ref 5–15)
BUN: 30 mg/dL — ABNORMAL HIGH (ref 8–23)
CO2: 30 mmol/L (ref 22–32)
Calcium: 8.7 mg/dL — ABNORMAL LOW (ref 8.9–10.3)
Chloride: 104 mmol/L (ref 98–111)
Creatinine, Ser: 0.62 mg/dL (ref 0.44–1.00)
GFR calc Af Amer: >60 mL/min (ref >60)
GFR calc non Af Amer: >60 mL/min (ref >60)
Glucose, Bld: 111 mg/dL — ABNORMAL HIGH (ref 70–99)
Potassium: 3.2 mmol/L — ABNORMAL LOW (ref 3.5–5.1)
Sodium: 141 mmol/L (ref 135–145)

## 2020-05-27 LAB — CBC
HCT: 27.5 % — ABNORMAL LOW (ref 36.0–46.0)
Hemoglobin: 9 g/dL — ABNORMAL LOW (ref 12.0–15.0)
MCH: 31.5 pg (ref 26.0–34.0)
MCHC: 32.7 g/dL (ref 30.0–36.0)
MCV: 96.2 fL (ref 80.0–100.0)
Platelets: 326 10*3/uL (ref 150–400)
RBC: 2.86 MIL/uL — ABNORMAL LOW (ref 3.87–5.11)
RDW: 15.6 % — ABNORMAL HIGH (ref 11.5–15.5)
WBC: 9.8 10*3/uL (ref 4.0–10.5)
nRBC: 0 % (ref 0.0–0.2)

## 2020-05-27 MED ORDER — AMIODARONE LOAD VIA INFUSION: 150.0000 mg | Freq: Once | INTRAVENOUS | Status: DC

## 2020-05-27 MED ORDER — MORPHINE SULFATE (PF) 2 MG/ML IV SOLN
INTRAVENOUS | Status: AC
  Administered 2020-05-27: 2 mg via INTRAVENOUS
  Filled 2020-05-27: qty 1

## 2020-05-27 MED ORDER — MORPHINE SULFATE (PF) 2 MG/ML IV SOLN: 2.0000 mg | Freq: Once | INTRAVENOUS | Status: AC

## 2020-05-27 MED ORDER — DIGOXIN 0.25 MG/ML IJ SOLN
0.2500 mg | Freq: Once | INTRAMUSCULAR | Status: AC
  Administered 2020-05-27: 0.25 mg via INTRAVENOUS
  Filled 2020-05-27: qty 2

## 2020-05-27 MED ORDER — AMIODARONE HCL IN DEXTROSE 360-4.14 MG/200ML-% IV SOLN: 60.0000 mg/h | INTRAVENOUS | Status: AC

## 2020-05-27 MED ORDER — LEVALBUTEROL HCL 0.63 MG/3ML IN NEBU
0.6300 mg | INHALATION_SOLUTION | Freq: Four times a day (QID) | RESPIRATORY_TRACT | Status: DC | PRN
  Administered 2020-05-27 – 2020-05-28 (×2): 0.63 mg via RESPIRATORY_TRACT
  Filled 2020-05-27 (×2): qty 3

## 2020-05-27 MED ORDER — AMIODARONE IV BOLUS ONLY 150 MG/100ML
150.0000 mg | Freq: Once | INTRAVENOUS | Status: DC
  Filled 2020-05-27: qty 100

## 2020-05-27 MED ORDER — AMIODARONE HCL IN DEXTROSE 360-4.14 MG/200ML-% IV SOLN
INTRAVENOUS | Status: AC
  Filled 2020-05-27: qty 200

## 2020-05-27 MED ORDER — POTASSIUM CHLORIDE 20 MEQ PO PACK
40.0000 meq | PACK | Freq: Two times a day (BID) | ORAL | Status: AC
  Administered 2020-05-27 (×2): 40 meq via ORAL
  Filled 2020-05-27 (×2): qty 2

## 2020-05-27 MED ORDER — AMIODARONE HCL IN DEXTROSE 360-4.14 MG/200ML-% IV SOLN
INTRAVENOUS | Status: AC
  Administered 2020-05-27: 59.4 mg/h via INTRAVENOUS
  Filled 2020-05-27: qty 200

## 2020-05-27 MED ORDER — AMIODARONE HCL IN DEXTROSE 360-4.14 MG/200ML-% IV SOLN: 30.0000 mg/h | INTRAVENOUS | Status: DC

## 2020-05-27 MED ORDER — ADENOSINE 6 MG/2ML IV SOLN
12.5000 mg | Freq: Once | INTRAVENOUS | Status: AC
  Administered 2020-05-27: 12.5 mg via INTRAVENOUS

## 2020-05-27 NOTE — Progress Notes (Signed)
   05/27/20 1738  Clinical Encounter Type  Visited With Patient;Health care provider  Visit Type Initial  Referral From Nurse  Consult/Referral To Chaplain  When chaplain arrived at the room, staff was working on patient. Page was RR. Chaplain stood by silently praying. Once [facility] told chaplain that patient was alright chaplain left and went to another page.

## 2020-05-27 NOTE — Progress Notes (Signed)
PT Cancellation Note  Patient Details Name: TATELYN VANHECKE MRN: [id number] DOB: [birth year]   Cancelled Treatment:    Reason Eval/Treat Not Completed: Medical issues which prohibited therapy; Per nursing pt with recent elevated resting HR in the 170-180s, requested PT hold at this time.  Will attempt to see pt at a future date/time as medically appropriate.     Linus Salmons PT, DPT 05/27/20, 4:37 PM

## 2020-05-27 NOTE — Progress Notes (Signed)
Pulmonary Medicine          Date: 05/27/2020,   MRN# [id number] Cynthia Price [birth year]     AdmissionWeight: 56.7 kg                 CurrentWeight: 52.3 kg   Referring physician: Dr Ree Kida   CHIEF COMPLAINT:   Loculated pleural effusion concerning for empyema   SUBJECTIVE   Patient resting in bed , shes still confused and hypotensive.  We are still treating with maximal aggresion but with little improvement. Discussed with attending physician today.    PAST MEDICAL HISTORY   Past Medical History:  Diagnosis Date  . Anemia 2012   from labs at [facility]  . Back pain    s/p hemilaminectomy with h/o herniated disc at L4L5  . Blood clot in vein    behind left knee  . BP (high blood pressure) 03/08/2014   Last Assessment & Plan:  Blood pressure has been controlled without significant dizzyness or associated fatigue. Taking antihypertensives as directed without difficulty.    Marland Kitchen CAD (coronary artery disease) 10/18/2011  . Chronic diastolic heart failure ([place]) 09/07/2014   Last Assessment & Plan:  Edema and sob seemingly baseline   . Collagen vascular disease ([place])   . Depression    after husband died  . DVT (deep venous thrombosis) ([place]) 05/14/2012  . Essential thrombocytosis ([place])   . Hyperlipidemia   . 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[place]) 03/05/2015  . Hypotension 03/26/2013  . IBS (irritable bowel syndrome)   . Insomnia   . Lupus anticoagulant positive   . Macular degeneration   . MS (multiple sclerosis) ([place])   . Multiple sclerosis ([place]) 04/29/2008   Per neurology at [facility]    . NEUROPATHY 04/29/2008   Per Neurology at [facility]    . RLS (restless legs syndrome)   . Spinal stenosis    lumbar  . Urinary retention    with high post void residuals 2013, per [facility] uro     SURGICAL HISTORY   Past Surgical History:  Procedure Laterality Date  . ANTERIOR LUMBAR FUSION  2012  . APPENDECTOMY    . CHOLECYSTECTOMY    . CHOLECYSTECTOMY    . LUMBAR DISC  SURGERY    . LUMBAR LAMINECTOMY    . TONSILLECTOMY       FAMILY HISTORY   Family History  Problem Relation Age of Onset  . Heart disease Mother   . Thyroid cancer Mother   . Ovarian cancer Mother   . Alcohol abuse Father   . Heart disease Father   . Diabetes Sister   . Diabetes Brother   . Multiple sclerosis Paternal Aunt   . Multiple sclerosis Cousin      SOCIAL HISTORY   Social History   Tobacco Use  . Smoking status: Never Smoker  . Smokeless tobacco: Never Used  Substance Use Topics  . Alcohol use: No  . Drug use: No     MEDICATIONS    Home Medication:    Current Medication:  Current Facility-Administered Medications:  .  0.9 %  sodium chloride infusion, , Intravenous, PRN, Cristal Ford, DO, Last Rate: 10 mL/hr at 05/25/20 0914, 250 mL at 05/25/20 0914 .  acetaminophen (TYLENOL) tablet 650 mg, 650 mg, Oral, Q6H PRN, 650 mg at 05/23/20 1419 **OR** acetaminophen (TYLENOL) suppository 650 mg, 650 mg, Rectal, Q6H PRN, Oswald Hillock, RPH .  albumin human 25 % solution 12.5 g, 12.5 g, Intravenous, Daily,  Ottie Glazier, MD, Last Rate: 60 mL/hr at 05/27/20 1041, 12.5 g at 05/27/20 1041 .  baclofen (LIORESAL) tablet 20 mg, 20 mg, Oral, TID, Mansy, Jan A, MD, 20 mg at 05/27/20 1027 .  diclofenac sodium (VOLTAREN) 1 % transdermal gel 4 g, 4 g, Topical, QID, Mansy, Jan A, MD, 4 g at 05/26/20 2314 .  escitalopram (LEXAPRO) tablet 20 mg, 20 mg, Oral, Daily, Mansy, Jan A, MD, 20 mg at 05/27/20 1027 .  famotidine (PEPCID) tablet 20 mg, 20 mg, Oral, BID, Mansy, Jan A, MD, 20 mg at 05/27/20 1027 .  feeding supplement (ENSURE ENLIVE) (ENSURE ENLIVE) liquid 237 mL, 237 mL, Oral, BID BM, Mikhail, Maryann, DO, 237 mL at 05/26/20 1400 .  gabapentin (NEURONTIN) tablet 600 mg, 600 mg, Oral, QID, Mansy, Jan A, MD, 600 mg at 05/27/20 1025 .  guaiFENesin (MUCINEX) 12 hr tablet 600 mg, 600 mg, Oral, BID, Mansy, Jan A, MD, 600 mg at 05/26/20 2313 .  hydrocortisone sodium succinate  (SOLU-CORTEF) 100 MG injection 50 mg, 50 mg, Intravenous, Q8H, Mikhail, Maryann, DO, 50 mg at 05/27/20 0551 .  hydroxyurea (HYDREA) capsule 500 mg, 500 mg, Oral, Q MTWThF, Mansy, Jan A, MD, 500 mg at 05/27/20 0548 .  ipratropium-albuterol (DUONEB) 0.5-2.5 (3) MG/3ML nebulizer solution 3 mL, 3 mL, Nebulization, BID, Mansy, Jan A, MD, 3 mL at 05/27/20 0755 .  loratadine (CLARITIN) tablet 10 mg, 10 mg, Oral, Daily, Mansy, Jan A, MD, 10 mg at 05/27/20 1026 .  magnesium hydroxide (MILK OF MAGNESIA) suspension 30 mL, 30 mL, Oral, Daily PRN, Mansy, Jan A, MD .  magnesium oxide (MAG-OX) tablet 400 mg, 400 mg, Oral, BID AC & HS, Mansy, Jan A, MD, 400 mg at 05/26/20 2317 .  metoCLOPramide (REGLAN) tablet 5 mg, 5 mg, Oral, Q6H PRN, Mansy, Jan A, MD, 5 mg at 05/23/20 1551 .  midodrine (PROAMATINE) tablet 10 mg, 10 mg, Oral, TID WC, Kaniyah Lisby, MD, 10 mg at 05/27/20 0901 .  multivitamin with minerals tablet 1 tablet, 1 tablet, Oral, Daily, Mansy, Jan A, MD, 1 tablet at 05/27/20 1026 .  ondansetron (ZOFRAN) tablet 4 mg, 4 mg, Oral, Q6H PRN **OR** ondansetron (ZOFRAN) injection 4 mg, 4 mg, Intravenous, Q6H PRN, Mansy, Jan A, MD, 4 mg at 05/23/20 7106 .  oxyCODONE (Oxy IR/ROXICODONE) immediate release tablet 5 mg, 5 mg, Oral, Q6H PRN, Mansy, Jan A, MD, 5 mg at 05/21/20 2139 .  oxyCODONE (Oxy IR/ROXICODONE) immediate release tablet 5 mg, 5 mg, Oral, Q6H, Mansy, Jan A, MD, 5 mg at 05/27/20 1026 .  potassium chloride (KLOR-CON) packet 40 mEq, 40 mEq, Oral, BID, Sharen Hones, MD, 40 mEq at 05/27/20 1026 .  sodium chloride (OCEAN) 0.65 % nasal spray 1 spray, 1 spray, Each Nare, PRN, Cristal Ford, DO, 1 spray at 05/23/20 2124 .  sodium chloride 0.9 % bolus 250 mL, 250 mL, Intravenous, Once, Cristal Ford, DO .  sodium chloride flush (NS) 0.9 % injection 3 mL, 3 mL, Intravenous, Q12H, Mikhail, [place], DO, 3 mL at 05/25/20 0919 .  tiZANidine (ZANAFLEX) tablet 4 mg, 4 mg, Oral, TID, Mansy, Jan A, MD, 4 mg at  05/27/20 1027 .  traZODone (DESYREL) tablet 75 mg, 75 mg, Oral, QHS, Mansy, Jan A, MD, 75 mg at 05/26/20 2312    ALLERGIES   Atorvastatin     REVIEW OF SYSTEMS    Review of Systems:  Gen:  Denies  fever, sweats, chills weigh loss  HEENT: Denies blurred vision, double vision, ear  pain, eye pain, hearing loss, nose bleeds, sore throat Cardiac:  No dizziness, chest pain or heaviness, chest tightness,edema Resp:   Denies cough or sputum porduction, shortness of breath,wheezing, hemoptysis,  Gi: Denies swallowing difficulty, stomach pain, nausea or vomiting, diarrhea, constipation, bowel incontinence Gu:  Denies bladder incontinence, burning urine Ext:   Denies Joint pain, stiffness or swelling Skin: Denies  skin rash, easy bruising or bleeding or hives Endoc:  Denies polyuria, polydipsia , polyphagia or weight change Psych:   Denies depression, insomnia or hallucinations   Other:  All other systems negative   VS: BP 126/80 (BP Location: Right Arm)   Pulse 76   Temp 97.7 F (36.5 C)   Resp 18   Ht 5\' 5"  (1.651 m)   Wt 52.3 kg   SpO2 92%   BMI 19.20 kg/m      PHYSICAL EXAM    GENERAL:NAD, no fevers, chills, no weakness no fatigue HEAD: Normocephalic, atraumatic.  EYES: Pupils equal, round, reactive to light. Extraocular muscles intact. No scleral icterus.  MOUTH: Moist mucosal membrane. Dentition intact. No abscess noted.  EAR, NOSE, THROAT: Clear without exudates. No external lesions.  NECK: Supple. No thyromegaly. No nodules. No JVD.  PULMONARY: Decreased breath sounds bilaterally improved CARDIOVASCULAR: S1 and S2. Regular rate and rhythm. No murmurs, rubs, or gallops. No edema. Pedal pulses 2+ bilaterally.  GASTROINTESTINAL: Soft, nontender, nondistended. No masses. Positive bowel sounds. No hepatosplenomegaly.  MUSCULOSKELETAL: No swelling, clubbing, or edema. Range of motion full in all extremities.  NEUROLOGIC: Cranial nerves II through XII are intact. No  gross focal neurological deficits. Sensation intact. Reflexes intact.  SKIN: No ulceration, lesions, rashes, or cyanosis. Skin warm and dry. Turgor intact.  PSYCHIATRIC: Mood, affect within normal limits. The patient is awake, alert and oriented x 3. Insight, judgment intact.       IMAGING    DG Chest 1 View  Addendum Date: 05/21/2020   ADDENDUM REPORT: 05/21/2020 06:03 ADDENDUM: Additional attempts were made to reposition the patient. With diminished patient rotation and less obscuration of the left upper lung. These images reveal increasing now completely confluent opacity in the left lung base with air bronchograms likely reflecting a combination of consolidated lung, volume loss and layering effusion. Increasing opacities are present throughout the right mid to lower lung as well which could reflect further airspace disease or edema as well as small right effusion. This addendum will be called to the ordering clinician or representative by the Radiologist Assistant, and communication documented in the PACS or [employer]. Electronically Signed   By: Lovena Le M.D.   On: 05/21/2020 06:03   Result Date: 05/21/2020 CLINICAL DATA:  Shortness of breath EXAM: CHEST  1 VIEW COMPARISON:  Radiograph 05/19/2020, CT 06/14/2015 FINDINGS: Difficulty with patient positioning with the head projecting over the left mid to upper lung obscuring much of the visible portions of the left lung itself. Additionally there is a steep right anterior obliquity. Redemonstration of some patchy and streaky right basilar opacity. More globally increased opacity in the retrocardiac space. Obscuration of the hemidiaphragms is suggestive of developing pleural effusions. No visible right pneumothorax. Left apex obscured. Visible mediastinal contours are not significantly changed from prior though much of the left heart border is obscured. No acute osseous or soft tissue abnormality. Chronic rib deformities are again seen.  Telemetry leads overlie the chest. IMPRESSION: 1. Persistent right basilar airspace opacities with developing right effusion. 2. Globally increased opacity in the retrocardiac space, could reflect developing pleural  effusion, atelectasis or pneumonia. 3. Marked patient rotation as well patient's head obscuring the left mid to upper lung, limiting evaluation. Electronically Signed: By: Lovena Le M.D. On: 05/21/2020 05:13   CT CHEST W CONTRAST  Result Date: 05/21/2020 CLINICAL DATA:  Respiratory failure history of multiple sclerosis and DVT on Xarelto. EXAM: CT CHEST WITH CONTRAST TECHNIQUE: Multidetector CT imaging of the chest was performed during intravenous contrast administration. CONTRAST:  43mL OMNIPAQUE IOHEXOL 300 MG/ML  SOLN COMPARISON:  06/13/2015 FINDINGS: Cardiovascular: Heart size is normal with small pericardial effusion. Central pulmonary vasculature is normal on venous phase assessment, study not protocol for PE evaluation. Aortic caliber is normal. Mediastinum/Nodes: No axillary adenopathy. Thoracic inlet structures are normal. Lungs/Pleura: Moderate bilateral pleural effusions. Basilar airspace disease bilaterally. Effusion greatest on the RIGHT. Airspace disease greatest on the LEFT with component of volume loss and shift of mediastinal structures from RIGHT to LEFT into the LEFT chest. Upper Abdomen: Incidental imaging of upper abdominal contents without acute process. Musculoskeletal: Chronic deformity of the sternum indicative of prior fracture. Evidence of spinal fusion at the upper portion of the lumbar spine, incompletely imaged on CT data. No acute bone finding. T11 compression with similar appearance. IMPRESSION: 1. Moderate bilateral pleural effusions with bibasilar airspace disease greatest on the LEFT with component of volume loss and shift of mediastinal structures from RIGHT to LEFT into the LEFT chest. Much of this appears to represent volume loss though there is some areas of  variable enhancement that raise the question of concomitant pneumonia particularly at the LEFT lung base. Loss of air bronchograms raising the question of aspiration though there is no material seen in the trachea or larger airways. 2. Small pericardial effusion. 3. Chronic deformity of the sternum indicative of prior fracture. 4. T11 compression with similar appearance. 5. Aortic atherosclerosis. Aortic Atherosclerosis (ICD10-I70.0). Electronically Signed   By: Zetta Bills M.D.   On: 05/21/2020 11:06   DG Chest Port 1 View  Result Date: 05/23/2020 CLINICAL DATA:  Per chart Patient admitted with sepsis secondary to UTI and possibly pneumonia. Hx of high blood pressure, chronic heart failure, hypotension. Non smoker. EXAM: PORTABLE CHEST 1 VIEW COMPARISON:  05/21/2020 FINDINGS: Left lung base opacity obscures the hemidiaphragm and left heart border, associated with air bronchograms. There is bilateral vascular congestion and interstitial thickening. Hazy opacity is noted at the right lung base, which appears increased from the previous exam. Cardiac silhouette is normal in size. No mediastinal or hilar masses. Small bilateral effusions, better appreciated on the CT from 05/21/2020. No pneumothorax. IMPRESSION: 1. Mild increase in hazy opacity at the right lung base compared to the most recent prior exam, which may reflect an increase in atelectasis, pneumonia or a combination. 2. No other change. 3. Persistent consolidation in the left lung base, which may reflect pneumonia or atelectasis. Bilateral vascular congestion and interstitial prominence, also unchanged, without overt pulmonary edema. Electronically Signed   By: Lajean Manes M.D.   On: 05/23/2020 15:42   DG Chest Port 1 View  Result Date: 05/21/2020 CLINICAL DATA:  Status post right-sided thoracentesis. EXAM: PORTABLE CHEST 1 VIEW COMPARISON:  May 21, 2020 (4:34 a.m.) FINDINGS: The study is limited secondary to limited patient positioning. The  lungs are hyperinflated. Mild to moderate severity diffuse chronic appearing increased interstitial lung markings are seen. There is opacification of the lower half of the left lung. No pneumothorax is identified. The cardiac silhouette is borderline in size. Multiple chronic left-sided rib fractures are seen. Degenerative  changes seen throughout the thoracic spine. IMPRESSION: 1. Opacification of the lower half of the left lung consistent with moderate-sized pleural effusion and associated atelectasis versus infiltrate. 2. Underlying chronic interstitial lung disease. Electronically Signed   By: Virgina Norfolk M.D.   On: 05/21/2020 16:58   DG Chest Portable 1 View  Result Date: 05/19/2020 CLINICAL DATA:  Sepsis.  Hypotension.  Fever. EXAM: PORTABLE CHEST 1 VIEW COMPARISON:  08/19/2017 FINDINGS: Patient's chin obscures the apices. Significant patient rotation. Stable heart size and mediastinal contours. Streaky retrocardiac opacity. Patchy right lung base opacity. Possible small pleural effusions. No pneumothorax. No pulmonary edema. Bones under mineralized with scoliotic curvature of the spine. IMPRESSION: 1. Streaky retrocardiac opacity and patchy right lung base opacity, may be atelectasis or pneumonia. Possible small pleural effusions. 2. Rotated exam. Electronically Signed   By: Keith Rake M.D.   On: 05/19/2020 01:01   US THORACENTESIS ASP PLEURAL SPACE W/IMG GUIDE  Result Date: 05/21/2020 CLINICAL DATA:  Respiratory failure and bilateral pleural effusions, right greater than left. EXAM: ULTRASOUND GUIDED RIGHT THORACENTESIS COMPARISON:  None. PROCEDURE: An ultrasound guided thoracentesis was thoroughly discussed with the patient and questions answered. The benefits, risks, alternatives and complications were also discussed. The patient understands and wishes to proceed with the procedure. Written consent was obtained. Ultrasound was performed to localize and mark an adequate pocket of fluid in  the right chest. The area was then prepped and draped in the normal sterile fashion. 1% Lidocaine was used for local anesthesia. Under ultrasound guidance a 6 French Safe-T-Centesis catheter was introduced. Thoracentesis was performed. The catheter was removed and a dressing applied. COMPLICATIONS: None FINDINGS: A total of approximately 550 mL of clear, yellow fluid was removed. A fluid sample was sent for laboratory analysis. IMPRESSION: Successful ultrasound guided right thoracentesis yielding 550 mL of pleural fluid. Electronically Signed   By: Aletta Edouard M.D.   On: 05/21/2020 17:00                ASSESSMENT/PLAN   Acute hypoxemic respiratory failure likley due to bilateral compressive atelectasis  -present on admission   - patient with febrile illness and left lung infiltrate possible CAP  -procalcitonin with only mild elevation -will trend - thoracentesis performed and patient improved with now only 1-2L/min [place] - continue rocephin/zithromax - for total 7 days. Patient will be here 8 days tommorow   Bilateral pleural effusions - Reviewed fluid profile- lymphocyte predominant transudate suggestive of CHF vs MS related fluid -will attempt diuresis gently post improved BP -Thoracentesis studies are still peding -repeat CXR with itnerval improvement -will try diuresing again today - once time dose 20 lasix ordered - BP is stable    BiIateral compressive atelectasis -will drain fluid and diurese -recruitment manuevers with Metaneb therapy utilising saline  -albuterol nebulizer -patient unable to use IS/Flutter due to MS hx   Moderate protein calorie malnutrition   - hindering diuresis and may be cause of effusion   - will replete Albumin and consult RD nutritional consult    Circulatory shock-improved   - hindering ability to diurese- continue midodrine -present on admission - likely related to sepsis secondary to UTI vs CAP - resolved with now transient  hypotension  -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   Patient/Family are satisfied with care plan and all questions have been answered.   This document was prepared  using Systems analyst and may include unintentional dictation errors.     Ottie Glazier, M.D.  Division of [place]

## 2020-05-27 NOTE — Progress Notes (Signed)
Patients heart rate 150 -170 c/o of nose running and she couldn't breath placed on high flow O2 blood pressure elevated continued to monitor at 1715 rapid called rapid response nurse to bed side placed on Zoll adenosine given 12.5 mg x 2 amiodorone bouls given followed by drip HR decreased 100-130 morphine 2 mg also given patient placed on non rebreather family called in am per DR in regard to pallitive verses hospice daughter called me and stated she was coming here from [place] at 1730 she had not left and I was unable to reach son and she was informed of this she stated she would call him as of 1946 no family members her

## 2020-05-27 NOTE — Significant Event (Signed)
Rapid Response Event Note  Overview: Time Called: 8473 Arrival Time: 1717 Event Type: Cardiac  Initial Focused Assessment:Arrived to RR, pt in SVT on monitor in 180-210's   Interventions:Dr Zhang to bedside, pt put on zoll, given adenosine 12mg  x 2... each time rate slowed, then steadily went back up. Tried Amiodorone bouls and drip started. Rate then ranging from 115-130's. Pt put on non rebreather for comfort, and 2 mg morphine ordered also.  Plan of Care (if not transferred): Serenity SWOT RN, Danae Chen charge RN and pts RN Debbie aware of plan all at bedside, witll call for further assistance.  Event Summary: Name of Physician Notified: Zhang at 1720    at    Outcome: Stayed in room and stabalized  Event End Time: [place]

## 2020-05-27 NOTE — Progress Notes (Addendum)
PROGRESS NOTE    Cynthia Price  EML:[id number] DOB: Jan 22, 1946 DOA: 05/18/2020 PCP: [name], MD   Chief complaint.  Follow-up on sepsis  Brief Narrative:  PhyllisTateis a53 y.o.femalewith a known history of multiple sclerosis,DVT on Xarelto, depression, coronary artery disease,IBS, chronic anemia andchronic diastolic CHF who comes from her skilled nursing facility with acute onset of fever for the last couple of days for which she was given Tylenoland was noted to have hypotensionthat prompted 911 call. Upon arrival EMSherblood pressure was60/44 for which she wasgiven IV fluid bolusand blood pressure was up to the 80s in the ER.No history could be obtained from the patient due to her altered mental status and likely underlying dementia.  Interim history Patient admitted with sepsis secondary to UTI and possibly pneumonia, currently placed on IV antibiotics.  Also noted to have anemia, Xarelto held as FOBT was positive, GI consulted.  Overnight, patient became febrile with tachycardia and hypoxia, repeat chest x-ray was obtained.  Pulmonary consulted. S/p thoracentesis.  6/30.  Patient blood pressure still low, infection seem to be better.  Urine culture grew multiple species.  Blood cultures negative.  Palliative care consult obtained.    Assessment & Plan:   Active Problems:   Sepsis ([place])   Pressure injury of skin   Protein-calorie malnutrition, severe  1.  Sepsis secondary to UTI and pneumonia. Condition gradually improving.  Procalcitonin level gradually came down indicating infection has been uncontrolled. Patient also on Solu-Cortef for adrenal insufficiency, and midodrine for  Hypotension. Currently on Rocephin and Zithromax which is appropriate for pneumonia as well as UTI.  2.  Hypotension. Multifactorial.  Secondary to MS, adrenal insufficiency.  Currently treated with IV Solu-Cortef. Continue midodrine.  Patient chest x-ray has some  vascular congestion, will obtain BNP.  May give IV fluids if BNP is not significantly elevated.  #3.  Acute hypoxemic respiratory failure. Appear to be secondary to pleural effusion and pneumonia. No evidence of empyema, culture from pleural fluid was negative.  Appears to be transudates. Continue oxygen treatment.  Check a BNP tomorrow.  #4.  Acute on chronic anemia. No active GI bleed per gastroenterology.  Iron and b12 level normal.  Hemoglobin has been stable, will continue to monitor, transfuse as needed. Patient has occult blood,  Anticoagulation on hold.   #5.  Acute metabolic encephalopathy. Patient still has some confusion, overall mental status has improved.  6.  Aspiration risk with dysphagia. Seen by speech therapy.  Continue dysphagia 1 diet.  7.  Multiple sclerosis. Continue occupational therapy/physical therapy.   1655. Patient developed Atrial fib with RVR, will give digoxin 0.60m iv. May repeat if heart rate still high.   1750. HR went up to 200s with PSVT, gien 2 doses of adenosine and 150 mg of amio bolus. Then amio drip. HR 120s. Obtain cardiology consult.  DVT prophylaxis: SCDs Code Status: DNR Family Communication: talked with daughter  Disposition Plan:  . Patient came from: SNF           . Anticipated d/c place: SNF . Barriers to d/c OR conditions which need to be met to effect a safe d/c:   Consultants:   Pulmonology.  Procedures:  Thoracentesis Antimicrobials: Rocephin and Zithromax.  Subjective: Patient has significant weakness.  Mild short of breath, no cough.  Still on oxygen. No fever or chills. Poor appetite, no nausea vomiting.  Had a small amount of loose stools.    Objective: Vitals:   05/26/20 2000 05/27/20 0712106/30/21 09758  05/27/20 0755  BP:  114/77 126/80   Pulse:  72 76   Resp: 18  18   Temp:  98.3 F (36.8 C) 97.7 F (36.5 C)   TempSrc:      SpO2:  92% 94% 92%  Weight:  52.3 kg    Height:        Intake/Output  Summary (Last 24 hours) at 05/27/2020 1352 Last data filed at 05/27/2020 1023 Gross per 24 hour  Intake 120 ml  Output 400 ml  Net -280 ml   Filed Weights   05/25/20 0444 05/26/20 0500 05/27/20 0456  Weight: 52.5 kg 52.9 kg 52.3 kg    Examination:  General exam: Appears calm and comfortable  Respiratory system: Clear to auscultation. Respiratory effort normal. Cardiovascular system: S1 & S2 heard, RRR. No JVD, murmurs, rubs, gallops or clicks. No pedal edema. Gastrointestinal system: Abdomen is nondistended, soft and nontender. No organomegaly or masses felt. Normal bowel sounds heard. Central nervous system: Alert and oriented. No focal neurological deficits. Extremities: Symmetric  Skin: No rashes, lesions or ulcers Psychiatry: Judgement and insight appear normal. Mood & affect appropriate.     Data Reviewed: I have personally reviewed following labs and imaging studies  CBC: Recent Labs  Lab 05/22/20 0554 05/22/20 0554 05/23/20 0316 05/24/20 0534 05/25/20 0558 05/26/20 0455 05/27/20 0553  WBC 10.2  --  10.8* 12.3* 8.7  --  9.8  HGB 9.7*   < > 10.2* 10.1* 9.3* 9.0* 9.0*  HCT 29.4*   < > 31.9* 31.9* 29.1* 27.9* 27.5*  MCV 96.4  --  97.3 97.0 99.0  --  96.2  PLT 340  --  366 378 391  --  326   < > = values in this interval not displayed.   Basic Metabolic Panel: Recent Labs  Lab 05/22/20 0554 05/23/20 0316 05/24/20 0534 05/25/20 0558 05/27/20 0553  NA 143 139 142 141 141  K 4.0 3.6 3.6 4.2 3.2*  CL 110 104 105 105 104  CO2 _0 GLUCOSE 84 98 87 166* 111*  BUN _1 25* 30*  CREATININE 0.65 0.66 0.66 0.69 0.62  CALCIUM 8.4* 8.4* 8.5* 8.7* 8.7*   GFR: Estimated Creatinine Clearance: 50.9 mL/min (by C-G formula based on SCr of 0.62 mg/dL). Liver Function Tests: No results for input(s): AST, ALT, ALKPHOS, BILITOT, PROT, ALBUMIN in the last 168 hours. No results for input(s): LIPASE, AMYLASE in the last 168 hours. No results for input(s):  AMMONIA in the last 168 hours. Coagulation Profile: No results for input(s): INR, PROTIME in the last 168 hours. Cardiac Enzymes: No results for input(s): CKTOTAL, CKMB, CKMBINDEX, TROPONINI in the last 168 hours. BNP (last 3 results) No results for input(s): PROBNP in the last 8760 hours. HbA1C: No results for input(s): HGBA1C in the last 72 hours. CBG: Recent Labs  Lab 05/24/20 0614  GLUCAP 85   Lipid Profile: No results for input(s): CHOL, HDL, LDLCALC, TRIG, CHOLHDL, LDLDIRECT in the last 72 hours. Thyroid Function Tests: No results for input(s): TSH, T4TOTAL, FREET4, T3FREE, THYROIDAB in the last 72 hours. Anemia Panel: No results for input(s): VITAMINB12, FOLATE, FERRITIN, TIBC, IRON, RETICCTPCT in the last 72 hours. Sepsis Labs: Recent Labs  Lab 05/21/20 0636 05/21/20 0730 05/21/20 0936 05/22/20 0554 05/23/20 0316  PROCALCITON 0.11  --   --  0.90 0.74  LATICACIDVEN  --  0.7 0.9  --   --     Recent Results (from the past 240  hour(s))  Blood culture (routine x 2)     Status: None   Collection Time: 05/19/20 12:10 AM   Specimen: Right Antecubital; Blood  Result Value Ref Range Status   Specimen Description RIGHT ANTECUBITAL  Final   Special Requests   Final    BOTTLES DRAWN AEROBIC AND ANAEROBIC Blood Culture results may not be optimal due to an excessive volume of blood received in culture bottles   Culture   Final    NO GROWTH 5 DAYS Performed at [facility], [address] [place], [place] [address]    Report Status 05/24/2020 FINAL  Final  Respiratory Panel by RT PCR (Flu A&B, Covid) - Nasopharyngeal Swab     Status: None   Collection Time: 05/19/20 12:10 AM   Specimen: Nasopharyngeal Swab  Result Value Ref Range Status   SARS Coronavirus 2 by RT PCR NEGATIVE NEGATIVE Final    Comment: (NOTE) SARS-CoV-2 target nucleic acids are NOT DETECTED.  The SARS-CoV-2 RNA is generally detectable in upper respiratoy specimens during the acute phase of  infection. The lowest concentration of SARS-CoV-2 viral copies this assay can detect is 131 copies/mL. A negative result does not preclude SARS-Cov-2 infection and should not be used as the sole basis for treatment or other patient management decisions. A negative result may occur with  improper specimen collection/handling, submission of specimen other than nasopharyngeal swab, presence of viral mutation(s) within the areas targeted by this assay, and inadequate number of viral copies (<131 copies/mL). A negative result must be combined with clinical observations, patient history, and epidemiological information. The expected result is Negative.  Fact Sheet for Patients:  PinkCheek.be  Fact Sheet for Healthcare Providers:  GravelBags.it  This test is no t yet approved or cleared by the [place] FDA and  has been authorized for detection and/or diagnosis of SARS-CoV-2 by FDA under an Emergency Use Authorization (EUA). This EUA will remain  in effect (meaning this test can be used) for the duration of the COVID-19 declaration under Section 564(b)(1) of the Act, 21 U.S.C. section 360bbb-3(b)(1), unless the authorization is terminated or revoked sooner.     Influenza A by PCR NEGATIVE NEGATIVE Final   Influenza B by PCR NEGATIVE NEGATIVE Final    Comment: (NOTE) The Xpert Xpress SARS-CoV-2/FLU/RSV assay is intended as an aid in  the diagnosis of influenza from Nasopharyngeal swab specimens and  should not be used as a sole basis for treatment. Nasal washings and  aspirates are unacceptable for Xpert Xpress SARS-CoV-2/FLU/RSV  testing.  Fact Sheet for Patients: PinkCheek.be  Fact Sheet for Healthcare Providers: GravelBags.it  This test is not yet approved or cleared by the [place] FDA and  has been authorized for detection and/or diagnosis of SARS-CoV-2  by  FDA under an Emergency Use Authorization (EUA). This EUA will remain  in effect (meaning this test can be used) for the duration of the  Covid-19 declaration under Section 564(b)(1) of the Act, 21  U.S.C. section 360bbb-3(b)(1), unless the authorization is  terminated or revoked. Performed at [facility], [address], [place], [place] [address]   Urine culture     Status: Abnormal   Collection Time: 05/19/20 12:11 AM   Specimen: Urine, Clean Catch  Result Value Ref Range Status   Specimen Description   Final    URINE, CLEAN CATCH Performed at [facility], [address], [place], [place] [address]    Special Requests   Final    NONE  Performed at [facility], [address] [place], [place] [address]    Culture MULTIPLE SPECIES PRESENT, SUGGEST RECOLLECTION (A)  Final   Report Status 05/20/2020 FINAL  Final  Blood culture (routine x 2)     Status: None   Collection Time: 05/19/20 12:27 AM   Specimen: BLOOD RIGHT HAND  Result Value Ref Range Status   Specimen Description BLOOD RIGHT HAND  Final   Special Requests   Final    BOTTLES DRAWN AEROBIC AND ANAEROBIC Blood Culture adequate volume   Culture   Final    NO GROWTH 5 DAYS Performed at [facility], [address] [place], [place] [address]    Report Status 05/24/2020 FINAL  Final  MRSA PCR Screening     Status: None   Collection Time: 05/19/20  6:31 PM   Specimen: Nasopharyngeal  Result Value Ref Range Status   MRSA by PCR NEGATIVE NEGATIVE Final    Comment:        The GeneXpert MRSA Assay (FDA approved for NASAL specimens only), is one component of a comprehensive MRSA colonization surveillance program. It is not intended to diagnose MRSA infection nor to guide or monitor treatment for MRSA infections. Performed at [facility], [address] [place], [place] [address]   Body fluid culture     Status: None   Collection Time: 05/21/20  4:38 PM    Specimen: PATH Cytology Pleural fluid  Result Value Ref Range Status   Specimen Description   Final    PLEURAL Performed at [facility], [address] [place], [place] [address]    Special Requests NONE  Final   Gram Stain   Final    FEW WBC PRESENT, PREDOMINANTLY MONONUCLEAR NO ORGANISMS SEEN    Culture   Final    NO GROWTH Performed at [facility], [place] [address] [place], [place] [address]    Report Status 05/25/2020 FINAL  Final  Acid Fast Smear (AFB)     Status: None   Collection Time: 05/21/20  4:38 PM   Specimen: PATH Cytology Pleural fluid  Result Value Ref Range Status   AFB Specimen Processing Concentration  Final   Acid Fast Smear Negative  Final    Comment: (NOTE) Performed At: [facility] [address] [place], [place] [id number] Rush Farmer MD ON:[id number]    Source (AFB) PLEURAL  Final    Comment: Performed at [facility], [address], [place], [place] [address]         Radiology Studies: No results found.      Scheduled Meds: . baclofen  20 mg Oral TID  . diclofenac sodium  4 g Topical QID  . escitalopram  20 mg Oral Daily  . famotidine  20 mg Oral BID  . feeding supplement (ENSURE ENLIVE)  237 mL Oral BID BM  . gabapentin  600 mg Oral QID  . guaiFENesin  600 mg Oral BID  . hydrocortisone sod succinate (SOLU-CORTEF) inj  50 mg Intravenous Q8H  . hydroxyurea  500 mg Oral Q MTWThF  . ipratropium-albuterol  3 mL Nebulization BID  . loratadine  10 mg Oral Daily  . magnesium oxide  400 mg Oral BID AC & HS  . midodrine  10 mg Oral TID WC  . multivitamin with minerals  1 tablet Oral Daily  . oxyCODONE  5 mg Oral Q6H  . potassium chloride  40 mEq Oral BID  . sodium chloride flush  3 mL Intravenous Q12H  . tiZANidine  4 mg Oral TID  . traZODone  75 mg Oral QHS   Continuous Infusions: . sodium chloride 250 mL (05/25/20 0914)  . albumin human 12.5 g (05/27/20 1041)  . sodium chloride       LOS: 8  days    Time spent: 35 minutes    Sharen Hones, MD [facility]   To contact the attending provider between 7A-7P or the covering provider during after hours 7P-7A, please log into the web site [email or web address] and access using universal [place] password for that web site. If you do not have the password, please call the hospital operator.  05/27/2020, 1:52 PM

## 2020-05-28 ENCOUNTER — Encounter: Payer: Self-pay | Admitting: Family Medicine

## 2020-05-28 ENCOUNTER — Inpatient Hospital Stay
Admit: 2020-05-28 | Discharge: 2020-05-28 | Disposition: A | Discharge status: Home / Self Care | Payer: Medicare Other | Attending: Internal Medicine | Admitting: Internal Medicine

## 2020-05-28 DIAGNOSIS — Z515 Encounter for palliative care: Secondary | ICD-10-CM

## 2020-05-28 DIAGNOSIS — A419 Sepsis, unspecified organism: Secondary | ICD-10-CM

## 2020-05-28 DIAGNOSIS — J189 Pneumonia, unspecified organism: Secondary | ICD-10-CM

## 2020-05-28 DIAGNOSIS — I5033 Acute on chronic diastolic (congestive) heart failure: Secondary | ICD-10-CM

## 2020-05-28 DIAGNOSIS — D649 Anemia, unspecified: Secondary | ICD-10-CM

## 2020-05-28 DIAGNOSIS — J9601 Acute respiratory failure with hypoxia: Secondary | ICD-10-CM | POA: Diagnosis present

## 2020-05-28 DIAGNOSIS — Z7189 Other specified counseling: Secondary | ICD-10-CM

## 2020-05-28 DIAGNOSIS — E43 Unspecified severe protein-calorie malnutrition: Secondary | ICD-10-CM

## 2020-05-28 DIAGNOSIS — I9589 Other hypotension: Secondary | ICD-10-CM

## 2020-05-28 LAB — BASIC METABOLIC PANEL
Anion gap: 8 (ref 5–15)
BUN: 32 mg/dL — ABNORMAL HIGH (ref 8–23)
CO2: 29 mmol/L (ref 22–32)
Calcium: 8.6 mg/dL — ABNORMAL LOW (ref 8.9–10.3)
Chloride: 107 mmol/L (ref 98–111)
Creatinine, Ser: 0.56 mg/dL (ref 0.44–1.00)
GFR calc Af Amer: >60 mL/min (ref >60)
GFR calc non Af Amer: >60 mL/min (ref >60)
Glucose, Bld: 124 mg/dL — ABNORMAL HIGH (ref 70–99)
Potassium: 3.6 mmol/L (ref 3.5–5.1)
Sodium: 144 mmol/L (ref 135–145)

## 2020-05-28 LAB — BRAIN NATRIURETIC PEPTIDE: B Natriuretic Peptide: 4403 pg/mL — ABNORMAL HIGH (ref 0.0–100.0)

## 2020-05-28 LAB — CBC WITH DIFFERENTIAL/PLATELET
Abs Immature Granulocytes: 0.12 10*3/uL — ABNORMAL HIGH (ref 0.00–0.07)
Basophils Absolute: 0 10*3/uL (ref 0.0–0.1)
Basophils Relative: 0 %
Eosinophils Absolute: 0 10*3/uL (ref 0.0–0.5)
Eosinophils Relative: 0 %
HCT: 27.8 % — ABNORMAL LOW (ref 36.0–46.0)
Hemoglobin: 8.8 g/dL — ABNORMAL LOW (ref 12.0–15.0)
Immature Granulocytes: 1 %
Lymphocytes Relative: 5 %
Lymphs Abs: 0.8 10*3/uL (ref 0.7–4.0)
MCH: 31.7 pg (ref 26.0–34.0)
MCHC: 31.7 g/dL (ref 30.0–36.0)
MCV: 100 fL (ref 80.0–100.0)
Monocytes Absolute: 0.7 10*3/uL (ref 0.1–1.0)
Monocytes Relative: 5 %
Neutro Abs: 12.5 10*3/uL — ABNORMAL HIGH (ref 1.7–7.7)
Neutrophils Relative %: 89 %
Platelets: 296 10*3/uL (ref 150–400)
RBC: 2.78 MIL/uL — ABNORMAL LOW (ref 3.87–5.11)
RDW: 15.7 % — ABNORMAL HIGH (ref 11.5–15.5)
WBC: 14.1 10*3/uL — ABNORMAL HIGH (ref 4.0–10.5)
nRBC: 0 % (ref 0.0–0.2)

## 2020-05-28 LAB — TSH: TSH: 1.151 u[IU]/mL (ref 0.350–4.500)

## 2020-05-28 LAB — ECHOCARDIOGRAM COMPLETE
Height: 65 in
Weight: 1846.22 oz

## 2020-05-28 LAB — MAGNESIUM: Magnesium: 2.3 mg/dL (ref 1.7–2.4)

## 2020-05-28 MED ORDER — FUROSEMIDE 10 MG/ML IJ SOLN
40.0000 mg | Freq: Two times a day (BID) | INTRAMUSCULAR | Status: DC
  Administered 2020-05-28 – 2020-05-30 (×4): 40 mg via INTRAVENOUS
  Filled 2020-05-28 (×4): qty 4

## 2020-05-28 MED ORDER — AMIODARONE HCL 200 MG PO TABS
200.0000 mg | ORAL_TABLET | Freq: Every day | ORAL | Status: DC
  Administered 2020-05-28 – 2020-06-03 (×6): 200 mg via ORAL
  Filled 2020-05-28 (×6): qty 1

## 2020-05-28 MED ORDER — POTASSIUM CHLORIDE 20 MEQ PO PACK
40.0000 meq | PACK | Freq: Two times a day (BID) | ORAL | Status: AC
  Administered 2020-05-28 (×2): 40 meq via ORAL
  Filled 2020-05-28 (×2): qty 2

## 2020-05-28 MED ORDER — MIDODRINE HCL 5 MG PO TABS
5.0000 mg | ORAL_TABLET | Freq: Three times a day (TID) | ORAL | Status: DC
  Administered 2020-05-28 – 2020-05-29 (×3): 5 mg via ORAL
  Filled 2020-05-28 (×3): qty 1

## 2020-05-28 MED ORDER — FUROSEMIDE 10 MG/ML IJ SOLN
20.0000 mg | Freq: Two times a day (BID) | INTRAMUSCULAR | Status: DC
  Administered 2020-05-28: 20 mg via INTRAVENOUS
  Filled 2020-05-28: qty 2

## 2020-05-28 NOTE — Progress Notes (Signed)
PT Cancellation Note  Patient Details Name: Cynthia Price MRN: [id number] DOB: 06-30-1946   Cancelled Treatment:    Reason Eval/Treat Not Completed: PT screened, no needs identified, will sign off.  Per phone conversation with nursing at [facility] pt has been total care for at least a year.  She requires total assist for all functional mobility tasks and ADLs including hoyer lift for transfers.  Pt is unable to feed herself.  No needs identified for skilled PT services, will complete PT orders at this time.    Linus Salmons PT, DPT 05/28/20, 12:04 PM

## 2020-05-28 NOTE — Progress Notes (Signed)
PROGRESS NOTE    Cynthia Price  DIX:[id number] DOB: [birth year] DOA: 05/18/2020 PCP: [name], MD   Chief complaint.  Shortness of breath.  Brief Narrative:  PhyllisTateis a39 y.o.femalewith a known history of multiple sclerosis,DVT on Xarelto, depression, coronary artery disease,IBS, chronic anemia andchronic diastolic CHF who comes from her skilled nursing facility with acute onset of fever for the last couple of days for which she was given Tylenoland was noted to have hypotensionthat prompted 911 call. Upon arrival EMSherblood pressure was60/44 for which she wasgiven IV fluid bolusand blood pressure was up to the 80s in the ER.No history could be obtained from the patient due to her altered mental status and likely underlying dementia.  Interim history Patient admitted with sepsis secondary to UTI and possibly pneumonia, currently placed on IV antibiotics. Also noted to have anemia, Xarelto held as FOBT was positive, GI consulted. Overnight, patient became febrile with tachycardia and hypoxia, repeat chest x-ray was obtained. Pulmonary consulted. S/p thoracentesis.  6/30.  Patient blood pressure still low, infection seem to be better.  Urine culture grew multiple species.  Blood cultures negative.  Palliative care consult obtained.  She also had an episode of PSVT with heart rate over 200.  Received adenosine, amiodarone bolus as well as amnio drip.    7/1.  Patient has converted to sinus rhythm today.  Still on 2 L oxygen.  Condition more stable.  Will likely transfer to nursing home tomorrow.  Antibiotics completed.   Assessment & Plan:   Active Problems:   Hypotension   Acute on chronic diastolic CHF (congestive heart failure) (HCC)   Sepsis (HCC)   Pressure injury of skin   Protein-calorie malnutrition, severe   Acute hypoxemic respiratory failure ([place])  #1.  Sepsis with UTI and pneumonia. Condition has improved.  Completed antibiotics.  2.   Hypotension. Multifactorial.  Patient had developed PSVT yesterday, blood pressure is better today, will reduce the dose of midodrine to 5 mg 3 times a day.  3.  PSVT. Received amiodarone drip and bolus.  Currently in sinus rhythm.  Amiodarone will be changed to oral for now.  May not need a dose at time of discharge.  4.  Acute hypoxemic respiratory failure.   Secondary to pneumonia and pleural effusion.  Patient also has significant elevation of BNP, suggesting acute on chronic congestive heart failure.  Continue oxygen treatment.  5.  Acute on chronic diastolic congestive heart failure. No echocardiogram performed recently.  Will obtain echocardiogram.  Patient had a profound elevation of BNP, will start IV Lasix.  We will give 20 mg IV Lasix every 12 hours as patient has tendency to develop hypotension.  6.  Dysphagia with the risk of aspiration. Continue dysphagia diet.    7.  Multiple sclerosis. Continue occupational therapy/physical therapy.  8.  Prognosis. Patient long-term prognosis is very poor.  Patient has been seen by palliative care.  Planning to send her back to nursing home with palliative care.    DVT prophylaxis: SCDs Code Status: DNR Family Communication: talked with daughter in room.  Disposition Plan:   Patient came from: SNF  Anticipated d/c place: SNF  Barriers to d/c OR conditions which need to be met to effect a safe d/c:   Consultants:   Pulmonology.  Procedures:  Thoracentesis Antimicrobials: None    Subjective: Patient still has some short of breath today.  On 3 L oxygen.  Mild cough, nonproductive. No nausea vomiting.  Patient was a fed by her daughter, she was eating slowly. No fever or chills. No dysuria hematuria.  Objective: Vitals:   05/27/20 2322 05/28/20 0500 05/28/20 0601 05/28/20 0733  BP: 120/76  125/71 135/75    Pulse: 80  71 73  Resp:   18 19  Temp:   97.8 F (36.6 C) 98.2 F (36.8 C)  TempSrc:   Oral Oral  SpO2: 97%  96% 95%  Weight:  52.3 kg    Height:        Intake/Output Summary (Last 24 hours) at 05/28/2020 1005 Last data filed at 05/28/2020 0931 Gross per 24 hour  Intake 692.4 ml  Output 300 ml  Net 392.4 ml   Filed Weights   05/26/20 0500 05/27/20 0456 05/28/20 0500  Weight: 52.9 kg 52.3 kg 52.3 kg    Examination:  General exam: Appears calm and comfortable, chronically ill. Respiratory system: Crackles in the base to auscultation. Respiratory effort normal. Cardiovascular system: S1 & S2 heard, RRR. No JVD, murmurs, rubs, gallops or clicks. No pedal edema. Gastrointestinal system: Abdomen is nondistended, soft and nontender. No organomegaly or masses felt. Normal bowel sounds heard. Central nervous system: Alert and oriented x2. No focal neurological deficits. Extremities: Symmetric  Skin: No rashes, lesions or ulcers Psychiatry:  Mood & affect appropriate.     Data Reviewed: I have personally reviewed following labs and imaging studies  CBC: Recent Labs  Lab 05/23/20 0316 05/23/20 0316 05/24/20 0534 05/25/20 0558 05/26/20 0455 05/27/20 0553 05/28/20 0417  WBC 10.8*  --  12.3* 8.7  --  9.8 14.1*  NEUTROABS  --   --   --   --   --   --  12.5*  HGB 10.2*   < > 10.1* 9.3* 9.0* 9.0* 8.8*  HCT 31.9*   < > 31.9* 29.1* 27.9* 27.5* 27.8*  MCV 97.3  --  97.0 99.0  --  96.2 100.0  PLT 366  --  378 391  --  326 296   < > = values in this interval not displayed.   Basic Metabolic Panel: Recent Labs  Lab 05/23/20 0316 05/24/20 0534 05/25/20 0558 05/27/20 0553 05/28/20 0417  NA 139 142 141 141 144  K 3.6 3.6 4.2 3.2* 3.6  CL 104 105 105 104 107  CO2 25 29 27 30 29   GLUCOSE 98 87 166* 111* 124*  BUN 18 20 25* 30* 32*  CREATININE 0.66 0.66 0.69 0.62 0.56  CALCIUM 8.4* 8.5* 8.7* 8.7* 8.6*  MG  --   --   --   --  2.3   GFR: Estimated Creatinine Clearance: 50.9  mL/min (by C-G formula based on SCr of 0.56 mg/dL). Liver Function Tests: No results for input(s): AST, ALT, ALKPHOS, BILITOT, PROT, ALBUMIN in the last 168 hours. No results for input(s): LIPASE, AMYLASE in the last 168 hours. No results for input(s): AMMONIA in the last 168 hours. Coagulation Profile: No results for input(s): INR, PROTIME in the last 168 hours. Cardiac Enzymes: No results for input(s): CKTOTAL, CKMB, CKMBINDEX, TROPONINI in the last 168 hours. BNP (last 3 results) No results for input(s): PROBNP in the last 8760  hours. HbA1C: No results for input(s): HGBA1C in the last 72 hours. CBG: Recent Labs  Lab 05/24/20 0614  GLUCAP 85   Lipid Profile: No results for input(s): CHOL, HDL, LDLCALC, TRIG, CHOLHDL, LDLDIRECT in the last 72 hours. Thyroid Function Tests: No results for input(s): TSH, T4TOTAL, FREET4, T3FREE, THYROIDAB in the last 72 hours. Anemia Panel: No results for input(s): VITAMINB12, FOLATE, FERRITIN, TIBC, IRON, RETICCTPCT in the last 72 hours. Sepsis Labs: Recent Labs  Lab 05/22/20 0554 05/23/20 0316  PROCALCITON 0.90 0.74    Recent Results (from the past 240 hour(s))  Blood culture (routine x 2)     Status: None   Collection Time: 05/19/20 12:10 AM   Specimen: Right Antecubital; Blood  Result Value Ref Range Status   Specimen Description RIGHT ANTECUBITAL  Final   Special Requests   Final    BOTTLES DRAWN AEROBIC AND ANAEROBIC Blood Culture results may not be optimal due to an excessive volume of blood received in culture bottles   Culture   Final    NO GROWTH 5 DAYS Performed at [facility], [address] [place], [place] [address]    Report Status 05/24/2020 FINAL  Final  Respiratory Panel by RT PCR (Flu A&B, Covid) - Nasopharyngeal Swab     Status: None   Collection Time: 05/19/20 12:10 AM   Specimen: Nasopharyngeal Swab  Result Value Ref Range Status   SARS Coronavirus 2 by RT PCR NEGATIVE NEGATIVE Final    Comment:  (NOTE) SARS-CoV-2 target nucleic acids are NOT DETECTED.  The SARS-CoV-2 RNA is generally detectable in upper respiratoy specimens during the acute phase of infection. The lowest concentration of SARS-CoV-2 viral copies this assay can detect is 131 copies/mL. A negative result does not preclude SARS-Cov-2 infection and should not be used as the sole basis for treatment or other patient management decisions. A negative result may occur with  improper specimen collection/handling, submission of specimen other than nasopharyngeal swab, presence of viral mutation(s) within the areas targeted by this assay, and inadequate number of viral copies (<131 copies/mL). A negative result must be combined with clinical observations, patient history, and epidemiological information. The expected result is Negative.  Fact Sheet for Patients:  PinkCheek.be  Fact Sheet for Healthcare Providers:  GravelBags.it  This test is no t yet approved or cleared by the [place] FDA and  has been authorized for detection and/or diagnosis of SARS-CoV-2 by FDA under an Emergency Use Authorization (EUA). This EUA will remain  in effect (meaning this test can be used) for the duration of the COVID-19 declaration under Section 564(b)(1) of the Act, 21 U.S.C. section 360bbb-3(b)(1), unless the authorization is terminated or revoked sooner.     Influenza A by PCR NEGATIVE NEGATIVE Final   Influenza B by PCR NEGATIVE NEGATIVE Final    Comment: (NOTE) The Xpert Xpress SARS-CoV-2/FLU/RSV assay is intended as an aid in  the diagnosis of influenza from Nasopharyngeal swab specimens and  should not be used as a sole basis for treatment. Nasal washings and  aspirates are unacceptable for Xpert Xpress SARS-CoV-2/FLU/RSV  testing.  Fact Sheet for Patients: PinkCheek.be  Fact Sheet for Healthcare  Providers: GravelBags.it  This test is not yet approved or cleared by the [place] FDA and  has been authorized for detection and/or diagnosis of SARS-CoV-2 by  FDA under an Emergency Use Authorization (EUA). This EUA will remain  in effect (meaning this test can be used) for the duration of the  Covid-19  declaration under Section 564(b)(1) of the Act, 21  U.S.C. section 360bbb-3(b)(1), unless the authorization is  terminated or revoked. Performed at [facility], [address], [place], [place] [address]   Urine culture     Status: Abnormal   Collection Time: 05/19/20 12:11 AM   Specimen: Urine, Clean Catch  Result Value Ref Range Status   Specimen Description   Final    URINE, CLEAN CATCH Performed at [facility], [address], [place], [place] [address]    Special Requests   Final    NONE Performed at [facility], [address] [place], [place] [address]    Culture MULTIPLE SPECIES PRESENT, SUGGEST RECOLLECTION (A)  Final   Report Status 05/20/2020 FINAL  Final  Blood culture (routine x 2)     Status: None   Collection Time: 05/19/20 12:27 AM   Specimen: BLOOD RIGHT HAND  Result Value Ref Range Status   Specimen Description BLOOD RIGHT HAND  Final   Special Requests   Final    BOTTLES DRAWN AEROBIC AND ANAEROBIC Blood Culture adequate volume   Culture   Final    NO GROWTH 5 DAYS Performed at [facility], [address] [place], [place] [address]    Report Status 05/24/2020 FINAL  Final  MRSA PCR Screening     Status: None   Collection Time: 05/19/20  6:31 PM   Specimen: Nasopharyngeal  Result Value Ref Range Status   MRSA by PCR NEGATIVE NEGATIVE Final    Comment:        The GeneXpert MRSA Assay (FDA approved for NASAL specimens only), is one component of a comprehensive MRSA colonization surveillance program. It is not intended to diagnose MRSA infection nor to guide or monitor  treatment for MRSA infections. Performed at [facility], [address] [place], [place] [address]   Body fluid culture     Status: None   Collection Time: 05/21/20  4:38 PM   Specimen: PATH Cytology Pleural fluid  Result Value Ref Range Status   Specimen Description   Final    PLEURAL Performed at [facility], [address] [place], [place] [address]    Special Requests NONE  Final   Gram Stain   Final    FEW WBC PRESENT, PREDOMINANTLY MONONUCLEAR NO ORGANISMS SEEN    Culture   Final    NO GROWTH Performed at [facility], [place] [address] [place], [place] [address]    Report Status 05/25/2020 FINAL  Final  Acid Fast Smear (AFB)     Status: None   Collection Time: 05/21/20  4:38 PM   Specimen: PATH Cytology Pleural fluid  Result Value Ref Range Status   AFB Specimen Processing Concentration  Final   Acid Fast Smear Negative  Final    Comment: (NOTE) Performed At: [facility] [address] [place], [place] [id number] Rush Farmer MD JF:[id number]    Source (AFB) PLEURAL  Final    Comment: Performed at [facility], [address] [place], [place] [address]         Radiology Studies: No results found.      Scheduled Meds: . amiodarone  200 mg Oral Daily  . baclofen  20 mg Oral TID  . diclofenac sodium  4 g Topical QID  . escitalopram  20 mg Oral Daily  . famotidine  20 mg Oral BID  . feeding supplement (ENSURE ENLIVE)  237 mL Oral BID BM  .  gabapentin  600 mg Oral QID  . guaiFENesin  600 mg Oral BID  . hydrocortisone sod succinate (SOLU-CORTEF) inj  50 mg Intravenous Q8H  . hydroxyurea  500 mg Oral Q MTWThF  . loratadine  10 mg Oral Daily  . magnesium oxide  400 mg Oral BID AC & HS  . midodrine  10 mg Oral TID WC  . multivitamin with minerals  1 tablet Oral Daily  . oxyCODONE  5 mg Oral Q6H  . sodium chloride flush  3 mL Intravenous Q12H  . tiZANidine  4 mg Oral TID  . traZODone  75 mg Oral QHS    Continuous Infusions: . sodium chloride 250 mL (05/25/20 0914)  . albumin human 12.5 g (05/28/20 0918)  . sodium chloride       LOS: 9 days    Time spent: 35 minutes    Sharen Hones, MD [facility]   To contact the attending provider between 7A-7P or the covering provider during after hours 7P-7A, please log into the web site [email or web address] and access using universal [place] password for that web site. If you do not have the password, please call the hospital operator.  05/28/2020, 10:05 AM

## 2020-05-28 NOTE — Plan of Care (Signed)
  Problem: Education: Goal: Knowledge of General Education information will improve Description: Including pain rating scale, medication(s)/side effects and non-pharmacologic comfort measures Outcome: Progressing   Problem: Clinical Measurements: Goal: Cardiovascular complication will be avoided Outcome: Progressing   Problem: Coping: Goal: Level of anxiety will decrease Outcome: Progressing   

## 2020-05-28 NOTE — Care Management Important Message (Signed)
Important Message  Patient Details  Name: Cynthia Price MRN: [id number] Date of Birth: 12-Nov-1946   Medicare Important Message Given:  Yes     Dannette Barbara 05/28/2020, 2:00 PM

## 2020-05-28 NOTE — Progress Notes (Signed)
OT Cancellation Note  Patient Details Name: Cynthia Price MRN: [id number] DOB: 1946-01-25   Cancelled Treatment:    Reason Eval/Treat Not Completed: OT screened, no needs identified, will sign off. Order received, chart reviewed. Per PT conversation with nursing at [facility], pt has been total care for at least a year. She requires total assist for all functional mobility tasks and ADL including hoyer lift for transfers. Pt is unable to feed herself. No needs identified for skilled OT services, will complete OT orders at time.   Jerilynn Birkenhead, OTS 05/28/20, 12:58 PM

## 2020-05-28 NOTE — Progress Notes (Signed)
Pulmonary Medicine          Date: 05/28/2020,   MRN# [id number] Cynthia Price Cynthia Price, Cynthia Price     AdmissionWeight: 56.7 kg                 CurrentWeight: 52.3 kg   Referring physician: Dr Ree Kida   CHIEF COMPLAINT:   Loculated pleural effusion concerning for empyema   SUBJECTIVE   Patient resting in bed.  Daughter Cynthia Price at bedside. Discussed overall poor prognosis with thus far little improvement despite aggressive medical management with multi-disciplinary approach.   Patient is now on room air but complains of persistent dyspnea even while in bed at rest.   Case reviewed with family and Palliative care team today at bedside.   Code status is DNR   PAST MEDICAL HISTORY   Past Medical History:  Diagnosis Date  . Anemia 2012   from labs at [facility]  . Back pain    s/p hemilaminectomy with h/o herniated disc at L4L5  . Blood clot in vein    behind left knee  . CAD (coronary artery disease) 10/18/2011  . Chronic diastolic heart failure ([place]) Price/09/2014   Last Assessment & Plan:  Edema and sob seemingly baseline   . Collagen vascular disease ([place])   . Depression    after husband died  . DVT (deep venous thrombosis) ([place]) 05/14/2012  . Essential thrombocytosis ([place])   . Hyperlipidemia   . 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[place]) 03/05/2015  . Hypotension 03/26/2013  . IBS (irritable bowel syndrome)   . Insomnia   . Lupus anticoagulant positive   . Macular degeneration   . Multiple sclerosis ([place]) 04/29/2008   Per neurology at [facility]    . NEUROPATHY 04/29/2008   Per Neurology at [facility]    . RLS (restless legs syndrome)   . Spinal stenosis    lumbar  . Urinary retention    with high post void residuals 2013, per [facility] uro     SURGICAL HISTORY   Past Surgical History:  Procedure Laterality Date  . ANTERIOR LUMBAR FUSION  2012  . APPENDECTOMY    . CHOLECYSTECTOMY    . CHOLECYSTECTOMY    . LUMBAR DISC SURGERY    . LUMBAR LAMINECTOMY    . TONSILLECTOMY         FAMILY HISTORY   Family History  Problem Relation Age of Onset  . Heart disease Mother   . Thyroid cancer Mother   . Ovarian cancer Mother   . Alcohol abuse Father   . Heart disease Father   . Diabetes Sister   . Diabetes Brother   . Multiple sclerosis Paternal Aunt   . Multiple sclerosis Cousin      SOCIAL HISTORY   Social History   Tobacco Use  . Smoking status: Never Smoker  . Smokeless tobacco: Never Used  Substance Use Topics  . Alcohol use: No  . Drug use: No     MEDICATIONS    Home Medication:    Current Medication:  Current Facility-Administered Medications:  .  0.9 %  sodium chloride infusion, , Intravenous, PRN, Cristal Ford, DO, Last Rate: Price mL/hr at 05/25/20 0914, 250 mL at 05/25/20 0914 .  acetaminophen (TYLENOL) tablet 650 mg, 650 mg, Oral, Q6H PRN, 650 mg at 05/23/20 1419 **OR** acetaminophen (TYLENOL) suppository 650 mg, 650 mg, Rectal, Q6H PRN, Oswald Hillock, RPH .  albumin human 25 % solution 12.5 g, 12.5 g, Intravenous, Daily, Ottie Glazier, MD, Last  Rate: 60 mL/hr at 05/28/20 0918, 12.5 g at 05/28/20 0918 .  amiodarone (PACERONE) tablet 200 mg, 200 mg, Oral, Daily, Sharen Hones, MD, 200 mg at 05/28/20 1012 .  baclofen (LIORESAL) tablet 20 mg, 20 mg, Oral, TID, Mansy, Jan A, MD, 20 mg at 05/28/20 0906 .  diclofenac sodium (VOLTAREN) 1 % transdermal gel 4 g, 4 g, Topical, QID, Mansy, Jan A, MD, 4 g at 05/28/20 0905 .  escitalopram (LEXAPRO) tablet 20 mg, 20 mg, Oral, Daily, Mansy, Jan A, MD, 20 mg at 05/28/20 0904 .  famotidine (PEPCID) tablet 20 mg, 20 mg, Oral, BID, Mansy, Jan A, MD, 20 mg at 05/28/20 0904 .  feeding supplement (ENSURE ENLIVE) (ENSURE ENLIVE) liquid 237 mL, 237 mL, Oral, BID BM, Mikhail, Maryann, DO, 237 mL at 05/27/20 1000 .  furosemide (LASIX) injection 20 mg, 20 mg, Intravenous, Q12H, Sharen Hones, MD, 20 mg at 05/28/20 1103 .  gabapentin (NEURONTIN) tablet 600 mg, 600 mg, Oral, QID, Mansy, Jan A, MD, 600 mg at  05/28/20 0903 .  guaiFENesin (MUCINEX) 12 hr tablet 600 mg, 600 mg, Oral, BID, Mansy, Jan A, MD, 600 mg at 05/28/20 0905 .  hydrocortisone sodium succinate (SOLU-CORTEF) 100 MG injection 50 mg, 50 mg, Intravenous, Q8H, Mikhail, Maryann, DO, 50 mg at 05/28/20 0549 .  hydroxyurea (HYDREA) capsule 500 mg, 500 mg, Oral, Q MTWThF, Mansy, Jan A, MD, 500 mg at 05/28/20 0548 .  levalbuterol (XOPENEX) nebulizer solution 0.63 mg, 0.63 mg, Nebulization, Q6H PRN, Sharen Hones, MD, 0.63 mg at 05/27/20 1704 .  loratadine (CLARITIN) tablet Price mg, Price mg, Oral, Daily, Mansy, Jan A, MD, Price mg at 05/28/20 0903 .  magnesium hydroxide (MILK OF MAGNESIA) suspension 30 mL, 30 mL, Oral, Daily PRN, Mansy, Jan A, MD .  magnesium oxide (MAG-OX) tablet 400 mg, 400 mg, Oral, BID AC & HS, Mansy, Jan A, MD, 400 mg at 05/27/20 2251 .  metoCLOPramide (REGLAN) tablet 5 mg, 5 mg, Oral, Q6H PRN, Mansy, Jan A, MD, 5 mg at 05/23/20 1551 .  midodrine (PROAMATINE) tablet 5 mg, 5 mg, Oral, TID WC, Sharen Hones, MD, 5 mg at 05/28/20 1102 .  multivitamin with minerals tablet 1 tablet, 1 tablet, Oral, Daily, Mansy, Jan A, MD, 1 tablet at 05/28/20 0904 .  ondansetron (ZOFRAN) tablet 4 mg, 4 mg, Oral, Q6H PRN **OR** ondansetron (ZOFRAN) injection 4 mg, 4 mg, Intravenous, Q6H PRN, Mansy, Jan A, MD, 4 mg at 05/23/20 7628 .  oxyCODONE (Oxy IR/ROXICODONE) immediate release tablet 5 mg, 5 mg, Oral, Q6H PRN, Mansy, Jan A, MD, 5 mg at 05/21/20 2139 .  oxyCODONE (Oxy IR/ROXICODONE) immediate release tablet 5 mg, 5 mg, Oral, Q6H, Mansy, Jan A, MD, 5 mg at 05/28/20 1011 .  sodium chloride (OCEAN) 0.65 % nasal spray 1 spray, 1 spray, Each Nare, PRN, Cristal Ford, DO, 1 spray at 05/23/20 2124 .  sodium chloride 0.9 % bolus 250 mL, 250 mL, Intravenous, Once, Cristal Ford, DO .  sodium chloride flush (NS) 0.9 % injection 3 mL, 3 mL, Intravenous, Q12H, Mikhail, Butters, DO, 3 mL at 05/28/20 0906 .  tiZANidine (ZANAFLEX) tablet 4 mg, 4 mg, Oral, TID,  Mansy, Jan A, MD, 4 mg at 05/28/20 0903 .  traZODone (DESYREL) tablet 75 mg, 75 mg, Oral, QHS, Mansy, Jan A, MD, 75 mg at 05/27/20 2242    ALLERGIES   Atorvastatin     REVIEW OF SYSTEMS    Review of Systems:  Gen:  Denies  fever, sweats,  chills weigh loss  HEENT: Denies blurred vision, double vision, ear pain, eye pain, hearing loss, nose bleeds, sore throat Cardiac:  No dizziness, chest pain or heaviness, chest tightness,edema Resp:   Denies cough or sputum porduction, shortness of breath,wheezing, hemoptysis,  Gi: Denies swallowing difficulty, stomach pain, nausea or vomiting, diarrhea, constipation, bowel incontinence Gu:  Denies bladder incontinence, burning urine Ext:   Denies Joint pain, stiffness or swelling Skin: Denies  skin rash, easy bruising or bleeding or hives Endoc:  Denies polyuria, polydipsia , polyphagia or weight change Psych:   Denies depression, insomnia or hallucinations   Other:  All other systems negative   VS: BP 129/74 (BP Location: Left Arm)   Pulse 67   Temp 98.3 F (36.8 C)   Resp 18   Ht 5\' 5"  (1.651 m)   Wt 52.3 kg   SpO2 94%   BMI 19.20 kg/m      PHYSICAL EXAM    GENERAL:NAD, no fevers, chills, no weakness no fatigue HEAD: Normocephalic, atraumatic.  EYES: Pupils equal, round, reactive to light. Extraocular muscles intact. No scleral icterus.  MOUTH: Moist mucosal membrane. Dentition intact. No abscess noted.  EAR, NOSE, THROAT: Clear without exudates. No external lesions.  NECK: Supple. No thyromegaly. No nodules. No JVD.  PULMONARY: Decreased breath sounds bilaterally improved CARDIOVASCULAR: S1 and S2. Regular rate and rhythm. No murmurs, rubs, or gallops. No edema. Pedal pulses 2+ bilaterally.  GASTROINTESTINAL: Soft, nontender, nondistended. No masses. Positive bowel sounds. No hepatosplenomegaly.  MUSCULOSKELETAL: No swelling, clubbing, or edema. Range of motion full in all extremities.  NEUROLOGIC: Cranial nerves II  through XII are intact. No gross focal neurological deficits. Sensation intact. Reflexes intact.  SKIN: No ulceration, lesions, rashes, or cyanosis. Skin warm and dry. Turgor intact.  PSYCHIATRIC: Mood, affect within normal limits. The patient is awake, alert and oriented x 3. Insight, judgment intact.       IMAGING    DG Chest 1 View  Addendum Date: 05/21/2020   ADDENDUM REPORT: 05/21/2020 06:03 ADDENDUM: Additional attempts were made to reposition the patient. With diminished patient rotation and less obscuration of the left upper lung. These images reveal increasing now completely confluent opacity in the left lung base with air bronchograms likely reflecting a combination of consolidated lung, volume loss and layering effusion. Increasing opacities are present throughout the right mid to lower lung as well which could reflect further airspace disease or edema as well as small right effusion. This addendum will be called to the ordering clinician or representative by the Radiologist Assistant, and communication documented in the PACS or [employer]. Electronically Signed   By: Lovena Le M.D.   On: 05/21/2020 06:03   Result Date: 05/21/2020 CLINICAL DATA:  Shortness of breath EXAM: CHEST  1 VIEW COMPARISON:  Radiograph 05/19/2020, CT 06/14/2015 FINDINGS: Difficulty with patient positioning with the head projecting over the left mid to upper lung obscuring much of the visible portions of the left lung itself. Additionally there is a steep right anterior obliquity. Redemonstration of some patchy and streaky right basilar opacity. More globally increased opacity in the retrocardiac space. Obscuration of the hemidiaphragms is suggestive of developing pleural effusions. No visible right pneumothorax. Left apex obscured. Visible mediastinal contours are not significantly changed from prior though much of the left heart border is obscured. No acute osseous or soft tissue abnormality. Chronic rib  deformities are again seen. Telemetry leads overlie the chest. IMPRESSION: 1. Persistent right basilar airspace opacities with developing right effusion. 2.  Globally increased opacity in the retrocardiac space, could reflect developing pleural effusion, atelectasis or pneumonia. 3. Marked patient rotation as well patient's head obscuring the left mid to upper lung, limiting evaluation. Electronically Signed: By: Lovena Le M.D. On: 05/21/2020 05:13   CT CHEST W CONTRAST  Result Date: 05/21/2020 CLINICAL DATA:  Respiratory failure history of multiple sclerosis and DVT on Xarelto. EXAM: CT CHEST WITH CONTRAST TECHNIQUE: Multidetector CT imaging of the chest was performed during intravenous contrast administration. CONTRAST:  25mL OMNIPAQUE IOHEXOL 300 MG/ML  SOLN COMPARISON:  06/13/2015 FINDINGS: Cardiovascular: Heart size is normal with small pericardial effusion. Central pulmonary vasculature is normal on venous phase assessment, study not protocol for PE evaluation. Aortic caliber is normal. Mediastinum/Nodes: No axillary adenopathy. Thoracic inlet structures are normal. Lungs/Pleura: Moderate bilateral pleural effusions. Basilar airspace disease bilaterally. Effusion greatest on the RIGHT. Airspace disease greatest on the LEFT with component of volume loss and shift of mediastinal structures from RIGHT to LEFT into the LEFT chest. Upper Abdomen: Incidental imaging of upper abdominal contents without acute process. Musculoskeletal: Chronic deformity of the sternum indicative of prior fracture. Evidence of spinal fusion at the upper portion of the lumbar spine, incompletely imaged on CT data. No acute bone finding. T11 compression with similar appearance. IMPRESSION: 1. Moderate bilateral pleural effusions with bibasilar airspace disease greatest on the LEFT with component of volume loss and shift of mediastinal structures from RIGHT to LEFT into the LEFT chest. Much of this appears to represent volume loss  though there is some areas of variable enhancement that raise the question of concomitant pneumonia particularly at the LEFT lung base. Loss of air bronchograms raising the question of aspiration though there is no material seen in the trachea or larger airways. 2. Small pericardial effusion. 3. Chronic deformity of the sternum indicative of prior fracture. 4. T11 compression with similar appearance. 5. Aortic atherosclerosis. Aortic Atherosclerosis (ICD10-I70.0). Electronically Signed   By: Zetta Bills M.D.   On: 05/21/2020 11:06   DG Chest Port 1 View  Result Date: 05/23/2020 CLINICAL DATA:  Per chart Patient admitted with sepsis secondary to UTI and possibly pneumonia. Hx of high blood pressure, chronic heart failure, hypotension. Non smoker. EXAM: PORTABLE CHEST 1 VIEW COMPARISON:  05/21/2020 FINDINGS: Left lung base opacity obscures the hemidiaphragm and left heart border, associated with air bronchograms. There is bilateral vascular congestion and interstitial thickening. Hazy opacity is noted at the right lung base, which appears increased from the previous exam. Cardiac silhouette is normal in size. No mediastinal or hilar masses. Small bilateral effusions, better appreciated on the CT from 05/21/2020. No pneumothorax. IMPRESSION: 1. Mild increase in hazy opacity at the right lung base compared to the most recent prior exam, which may reflect an increase in atelectasis, pneumonia or a combination. 2. No other change. 3. Persistent consolidation in the left lung base, which may reflect pneumonia or atelectasis. Bilateral vascular congestion and interstitial prominence, also unchanged, without overt pulmonary edema. Electronically Signed   By: Lajean Manes M.D.   On: 05/23/2020 15:42   DG Chest Port 1 View  Result Date: 05/21/2020 CLINICAL DATA:  Status post right-sided thoracentesis. EXAM: PORTABLE CHEST 1 VIEW COMPARISON:  May 21, 2020 (4:34 a.m.) FINDINGS: The study is limited secondary to  limited patient positioning. The lungs are hyperinflated. Mild to moderate severity diffuse chronic appearing increased interstitial lung markings are seen. There is opacification of the lower half of the left lung. No pneumothorax is identified. The cardiac silhouette is  borderline in size. Multiple chronic left-sided rib fractures are seen. Degenerative changes seen throughout the thoracic spine. IMPRESSION: 1. Opacification of the lower half of the left lung consistent with moderate-sized pleural effusion and associated atelectasis versus infiltrate. 2. Underlying chronic interstitial lung disease. Electronically Signed   By: Virgina Norfolk M.D.   On: 05/21/2020 16:58   DG Chest Portable 1 View  Result Date: 05/19/2020 CLINICAL DATA:  Sepsis.  Hypotension.  Fever. EXAM: PORTABLE CHEST 1 VIEW COMPARISON:  08/19/2017 FINDINGS: Patient's chin obscures the apices. Significant patient rotation. Stable heart size and mediastinal contours. Streaky retrocardiac opacity. Patchy right lung base opacity. Possible small pleural effusions. No pneumothorax. No pulmonary edema. Bones under mineralized with scoliotic curvature of the spine. IMPRESSION: 1. Streaky retrocardiac opacity and patchy right lung base opacity, may be atelectasis or pneumonia. Possible small pleural effusions. 2. Rotated exam. Electronically Signed   By: Keith Rake M.D.   On: 05/19/2020 01:01   US THORACENTESIS ASP PLEURAL SPACE W/IMG GUIDE  Result Date: 05/21/2020 CLINICAL DATA:  Respiratory failure and bilateral pleural effusions, right greater than left. EXAM: ULTRASOUND GUIDED RIGHT THORACENTESIS COMPARISON:  None. PROCEDURE: An ultrasound guided thoracentesis was thoroughly discussed with the patient and questions answered. The benefits, risks, alternatives and complications were also discussed. The patient understands and wishes to proceed with the procedure. Written consent was obtained. Ultrasound was performed to localize and  mark an adequate pocket of fluid in the right chest. The area was then prepped and draped in the normal sterile fashion. 1% Lidocaine was used for local anesthesia. Under ultrasound guidance a 6 French Safe-T-Centesis catheter was introduced. Thoracentesis was performed. The catheter was removed and a dressing applied. COMPLICATIONS: None FINDINGS: A total of approximately 550 mL of clear, yellow fluid was removed. A fluid sample was sent for laboratory analysis. IMPRESSION: Successful ultrasound guided right thoracentesis yielding 550 mL of pleural fluid. Electronically Signed   By: Aletta Edouard M.D.   On: 05/21/2020 17:00                ASSESSMENT/PLAN   Acute hypoxemic respiratory failure likley due to bilateral compressive atelectasis  -present on admission   - patient with febrile illness and left lung infiltrate possible CAP  -procalcitonin with only mild elevation -will trend - thoracentesis performed and patient improved with now only 1-2L/min [place] - continue rocephin/zithromax - for total 7 days. Patient will be here 8 days tommorow   Bilateral pleural effusions - Reviewed fluid profile- lymphocyte predominant transudate suggestive of CHF vs MS related fluid -will attempt diuresis gently post improved BP -Thoracentesis studies are still peding -repeat CXR with itnerval improvement -will try diuresing again today - once time dose 20 lasix ordered - BP is stable    BiIateral compressive atelectasis -will drain fluid and diurese -recruitment manuevers with Metaneb therapy utilising saline  -albuterol nebulizer -patient unable to use IS/Flutter due to MS hx   Moderate protein calorie malnutrition   - hindering diuresis and may be cause of effusion   - will replete Albumin and consult RD nutritional consult    Circulatory shock-improved   - hindering ability to diurese- continue midodrine -present on admission - likely related to sepsis secondary to UTI vs CAP -  resolved with now transient hypotension  -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   Patient/Family are satisfied with care plan and  all questions have been answered.   This document was prepared using Dragon voice recognition software and may include unintentional dictation errors.     Ottie Glazier, M.D.  Division of [facility]

## 2020-05-28 NOTE — Progress Notes (Signed)
  Speech Language Pathology Treatment: Dysphagia  Patient Details Name: Cynthia Price MRN: [id number] DOB: 1946-09-01 Today's Date: 05/28/2020 Time: 7473-4037 SLP Time Calculation (min) (ACUTE ONLY): 15 min  Assessment / Plan / Recommendation Clinical Impression  Pt has been consuming current diet for ~ 9 days without overt s/s of dysphagia or aspiration at the bedside. Pt is not a candidate for instrumental study given her physical posture d/t MS. Despite her medical decline, she consumed medicine crushed in puree with sips of thin liquids via straw without overt s/s of aspiration/dysphagia. Unfortunately, skilled ST isn't no longer indicated.   HPI HPI: Cynthia Price  is a 74 y.o. female with a known history of multiple sclerosis, DVT on Xarelto, depression, coronary artery disease, IBS, chronic anemia and chronic diastolic CHF who comes from her skilled nursing facility with acute onset of fever for the last couple of days for which she was given Tylenol and was noted to have hypotension (BP 76/46). UA positive for UTI, Chest x-ray showed streaky retrocardiac opacity and patchy right lung base opacity that may be atelectasis or pneumonia with possible small pleural effusions.      SLP Plan  Discharge SLP treatment due to (comment) (no further intervention is indicated)       Recommendations  Diet recommendations: Dysphagia 1 (puree);Thin liquid Liquids provided via: Straw Medication Administration: Crushed with puree Supervision: Full supervision/cueing for compensatory strategies;Staff to assist with self feeding Compensations: Minimize environmental distractions;Slow rate;Small sips/bites Postural Changes and/or Swallow Maneuvers: Seated upright 90 degrees;Upright 30-60 min after meal                Oral Care Recommendations: Oral care before and after PO Follow up Recommendations: Skilled Nursing facility SLP Visit Diagnosis: Dysphagia, unspecified (R13.10) Plan: Discharge SLP  treatment due to (comment) (no further intervention is indicated)       GO               Cynthia Price B. Rutherford Nail M.S., CCC-SLP, [place] Office [phone]  Cynthia Price 05/28/2020, 9:42 AM

## 2020-05-28 NOTE — Progress Notes (Signed)
*  PRELIMINARY RESULTS* Echocardiogram 2D Echocardiogram has been performed.  Cynthia Price 05/28/2020, 3:14 PM

## 2020-05-28 NOTE — Consult Note (Signed)
Cardiology Consultation:   Patient ID: Cynthia Price; [id number]; Apr 05, 1946   Admit date: 05/18/2020 Date of Consult: 05/28/2020  Primary Care Provider: [name], MD Primary Cardiologist: New to [facility] - previously seen by Dr. Rockey Situ in 2014 Primary Electrophysiologist:  None   Patient Profile:   Cynthia Price is a 74 y.o. female with a hx of coronary artery calcification noted on prior CT imaging, diastolic dysfunction, dementia, MS, recurrent DVT on Xarelto, chronic anemia, falls,depression, RLS back pain s/p hemilaminectomy who is being seen today for the evaluation of narrow complex tachycardia and volume overload at the request of Dr. Roosevelt Locks.  History of Present Illness:   Cynthia Price was previously seen by [facility] in 2013/2014 following hospital admission for recurrent falls with mild troponin elevation.  Prior echo in 2012 showed normal LV systolic function, mild to moderate tricuspid regurgitation, diastolic dysfunction, and PASP estimated at 30 to 40 mmHg.  More recently, she was seen by Dr. Ubaldo Glassing in 2018 with wcho at that time showing normal LVSF with mild MR/TR.  No prior ischemic testing available for review.  She has since been lost to follow up.   She was admitted to the hospital on 05/19/2020 from SNF with acute hypoxic respiratory failure, sepsis, worsening anemia requiring pRBC, hypotension secondary to likely CAP, UTI, malnutrition with hypoalbuminemia requiring replacement therapy and pleural effusions. She subsequently developed worsening respiratory status felt to be related to bilateral compressive atelectasis per pulmonology as well as worsening anemia with imaging concerning for a loculated pleural effusion concerning for empyema. She underwent right-sided thoracentesis with 550 mL clear yellow fluid removed with labs consistent with transudative effusion. Cytology negative for malignancy. Initial HGB 8.8 (prior ~ 10 in 2020) which down trended to 6.6 leading to  pRBC. She was FOBT positive. Xarelto was held. She has been evaluated by GI with no inpatient workup advised. Blood cultures have been negative. On 6/30, she was taken off telemetry at 1606 in sinus rhythm.  On telemetry replaced at 1620 he was noted to be in A. fib with RVR with ventricular rates ranging from the 140s bpm to the 190s to low 200s bpm.  She was given adenosine, IV amiodarone bolus and placed on an amiodarone gtt by IM with subsequent conversion to sinus rhythm. At the time, her potassium was 3.2, which has been improved to 3.6 currently. Last TSH 01/2019 noted to be elevated at 6.072. Echo is pending. BNP obtained today of 4403. HS-Tn not cycled this admission. With soft BP, she has bene managed with midodrine. She has been transitioned from IV amiodarone to oral at 200 mg daily. She is now on IV Lasix 20 mg bid and is net + 6.6 L for the admission. Weight trend this admission of 47.6-->52.3 kg.  History is taken from prior documentation as no further history is able to be obtained from the patient secondary to underlying dementia.    Past Medical History:  Diagnosis Date  . Anemia 2012   from labs at [facility]  . Back pain    s/p hemilaminectomy with h/o herniated disc at L4L5  . Blood clot in vein    behind left knee  . CAD (coronary artery disease) 10/18/2011  . Chronic diastolic heart failure ([place]) 09/07/2014   Last Assessment & Plan:  Edema and sob seemingly baseline   . Collagen vascular disease ([place])   . Depression    after husband died  . DVT (deep venous thrombosis) ([place]) 05/14/2012  .  Essential thrombocytosis ([place])   . Hyperlipidemia   . 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[place]) 03/05/2015  . Hypotension 03/26/2013  . IBS (irritable bowel syndrome)   . Insomnia   . Lupus anticoagulant positive   . Macular degeneration   . Multiple sclerosis ([place]) 04/29/2008   Per neurology at [facility]    . NEUROPATHY 04/29/2008   Per Neurology at [facility]    . RLS (restless legs syndrome)   .  Spinal stenosis    lumbar  . Urinary retention    with high post void residuals 2013, per Mallie Mussel    Past Surgical History:  Procedure Laterality Date  . ANTERIOR LUMBAR FUSION  2012  . APPENDECTOMY    . CHOLECYSTECTOMY    . CHOLECYSTECTOMY    . LUMBAR DISC SURGERY    . LUMBAR LAMINECTOMY    . TONSILLECTOMY       Home Meds: Prior to Admission medications   Medication Sig Start Date End Date Taking? Authorizing Provider  acetaminophen (TYLENOL) 325 MG tablet Take 650 mg by mouth every 4 (four) hours as needed. Give 2 tablets by mouth every 4 hours prn for general discomfort. Do not exceed 3 grams of APAP/day from all sources   Yes [provider]  baclofen (LIORESAL) 20 MG tablet Take 20 mg by mouth 3 (three) times daily. 05/06/20  Yes [provider]  diclofenac sodium (VOLTAREN) 1 % GEL Apply 4 g topically in the morning and at bedtime. Apply to left shoulder and left knee   Yes [provider]  escitalopram (LEXAPRO) 20 MG tablet Take 20 mg by mouth daily.    Yes [provider]  fluticasone (FLONASE) 50 MCG/ACT nasal spray Place 1 spray into both nostrils daily.   Yes [provider]  gabapentin (NEURONTIN) 600 MG tablet Take 600 mg by mouth in the morning, at noon, in the evening, and at bedtime.   Yes [provider]  hydroxyurea (HYDREA) 500 MG capsule Take one by mouth Monday, Tuesday, Wed, Thursday, Friday Patient taking differently: Take 500 mg by mouth every Monday, Tuesday, Wednesday, Thursday, and Friday.  05/10/13  Yes Tonia Ghent, MD  loratadine (CLARITIN) 10 MG tablet Take 10 mg by mouth daily.   Yes [provider]  Magnesium 500 MG CAPS Take 1 capsule (500 mg total) by mouth 2 (two) times daily at 8 am and 10 pm. 12/17/18 05/19/20 Yes Milinda Pointer, MD  metoCLOPramide (REGLAN) 5 MG tablet Take 5 mg by mouth. 1 tablet by mouth before meals and at bedtime.   Yes [provider]  Multiple  Vitamin (MULTIVITAMIN) tablet Take 1 tablet by mouth daily.   Yes [provider]  oxyCODONE (OXY IR/ROXICODONE) 5 MG immediate release tablet Take 1 tablet (5 mg total) by mouth every 6 (six) hours as needed for up to 30 days for severe pain. Must Last 30 days. 02/15/19 05/19/20 Yes Milinda Pointer, MD  rOPINIRole (REQUIP) 2 MG tablet Take 2 mg by mouth 4 (four) times daily.   Yes [provider]  tamsulosin (FLOMAX) 0.4 MG CAPS capsule Take 0.4 mg by mouth daily.   Yes [provider]  tiZANidine (ZANAFLEX) 4 MG tablet Take 1 tablet (4 mg total) by mouth every 8 (eight) hours as needed for muscle spasms. Patient taking differently: Take 4 mg by mouth in the morning, at noon, and at bedtime.  12/17/18 05/19/20 Yes Milinda Pointer, MD  traZODone (DESYREL) 50 MG tablet Take 75 mg by  mouth at bedtime.  12/24/18  Yes [provider]  XARELTO 20 MG TABS tablet Take 20 mg by mouth daily with breakfast.  09/07/15  Yes [provider]  AMPYRA 10 MG TB12 Take 10 mg by mouth 2 (two) times daily.  Patient not taking: Reported on 05/19/2020 09/11/12   [provider]  oxyCODONE (OXY IR/ROXICODONE) 5 MG immediate release tablet Take 1 tablet (5 mg total) by mouth every 6 (six) hours as needed for up to 30 days for severe pain. Must last 30 days. Patient taking differently: Take 5 mg by mouth every 6 (six) hours. Must last 30 days. 01/16/19 02/15/19  Milinda Pointer, MD  oxyCODONE (OXY IR/ROXICODONE) 5 MG immediate release tablet Take 1 tablet (5 mg total) by mouth every 6 (six) hours as needed for up to 30 days for severe pain. Must last 30 days. 12/17/18 01/16/19  Milinda Pointer, MD    Inpatient Medications: Scheduled Meds: . amiodarone  200 mg Oral Daily  . baclofen  20 mg Oral TID  . diclofenac sodium  4 g Topical QID  . escitalopram  20 mg Oral Daily  . famotidine  20 mg Oral BID  . feeding supplement (ENSURE ENLIVE)  237 mL Oral BID BM  .  furosemide  20 mg Intravenous Q12H  . gabapentin  600 mg Oral QID  . guaiFENesin  600 mg Oral BID  . hydrocortisone sod succinate (SOLU-CORTEF) inj  50 mg Intravenous Q8H  . hydroxyurea  500 mg Oral Q MTWThF  . loratadine  10 mg Oral Daily  . magnesium oxide  400 mg Oral BID AC & HS  . midodrine  5 mg Oral TID WC  . multivitamin with minerals  1 tablet Oral Daily  . oxyCODONE  5 mg Oral Q6H  . sodium chloride flush  3 mL Intravenous Q12H  . tiZANidine  4 mg Oral TID  . traZODone  75 mg Oral QHS   Continuous Infusions: . sodium chloride 250 mL (05/25/20 0914)  . albumin human 12.5 g (05/28/20 0918)  . sodium chloride     PRN Meds: sodium chloride, acetaminophen **OR** acetaminophen, levalbuterol, magnesium hydroxide, metoCLOPramide, ondansetron **OR** ondansetron (ZOFRAN) IV, oxyCODONE, sodium chloride  Allergies:   Allergies  Allergen Reactions  . Atorvastatin     Muscle aches at >40mg      Social History:   Social History   Socioeconomic History  . Marital status: Widowed    Spouse name: Not on file  . Number of children: 2  . Years of education: Not on file  . Highest education level: Not on file  Occupational History    Employer: Retired  Tobacco Use  . Smoking status: Never Smoker  . Smokeless tobacco: Never Used  Substance and Sexual Activity  . Alcohol use: No  . Drug use: No  . Sexual activity: Not on file  Other Topics Concern  . Not on file  Social History Narrative  . Not on file   Social Determinants of Health   Financial Resource Strain:   . Difficulty of Paying Living Expenses:   Food Insecurity:   . Worried About Charity fundraiser in the Last Year:   . Arboriculturist in the Last Year:   Transportation Needs:   . Film/video editor (Medical):   Marland Kitchen Lack of Transportation (Non-Medical):   Physical Activity:   . Days of Exercise per Week:   . Minutes of Exercise per Session:   Stress:   .  Feeling of Stress :   Social Connections:   .  Frequency of Communication with Friends and Family:   . Frequency of Social Gatherings with Friends and Family:   . Attends Religious Services:   . Active Member of Clubs or Organizations:   . Attends Archivist Meetings:   Marland Kitchen Marital Status:   Intimate Partner Violence:   . Fear of Current or Ex-Partner:   . Emotionally Abused:   Marland Kitchen Physically Abused:   . Sexually Abused:      Family History:   Family History  Problem Relation Age of Onset  . Heart disease Mother   . Thyroid cancer Mother   . Ovarian cancer Mother   . Alcohol abuse Father   . Heart disease Father   . Diabetes Sister   . Diabetes Brother   . Multiple sclerosis Paternal Aunt   . Multiple sclerosis Cousin     ROS:  Review of Systems  Unable to perform ROS: Dementia      Physical Exam/Data:   Vitals:   05/27/20 2322 05/28/20 0500 05/28/20 0601 05/28/20 0733  BP: 120/76  125/71 135/75  Pulse: 80  71 73  Resp:   18 19  Temp:   97.8 F (36.6 C) 98.2 F (36.8 C)  TempSrc:   Oral Oral  SpO2: 97%  96% 95%  Weight:  52.3 kg    Height:        Intake/Output Summary (Last 24 hours) at 05/28/2020 1126 Last data filed at 05/28/2020 0931 Gross per 24 hour  Intake 572.4 ml  Output 300 ml  Net 272.4 ml   Filed Weights   05/26/20 0500 05/27/20 0456 05/28/20 0500  Weight: 52.9 kg 52.3 kg 52.3 kg   Body mass index is 19.2 kg/m.   Physical Exam: General: Elderly and frail-appearing, in no acute distress. Head: Normocephalic, atraumatic, sclera non-icteric, no xanthomas, nares without discharge.  Neck: Negative for carotid bruits. JVD not elevated. Lungs: Diminished breath sounds bilaterally with crackles along the bilateral bases. Breathing is unlabored. Heart: RRR with S1 S2. II/VI systolic murmur, no rubs, or gallops appreciated. Abdomen: Soft, non-tender, non-distended with normoactive bowel sounds. No hepatomegaly. No rebound/guarding. No obvious abdominal masses. Msk: Significant thoracic  kyphosis noted. Strength and tone appear normal for age. Extremities: No clubbing or cyanosis. No edema. Distal pedal pulses are 2+ and equal bilaterally. Neuro: Alert and oriented X 3. No facial asymmetry. No focal deficit. Moves all extremities spontaneously. Psych:  Responds to questions appropriately with a normal affect.   EKG:  The EKG was personally reviewed and demonstrates: 05/19/2020 - sinus bradycardia, 58 bpm, no acute st/t changes Telemetry:  Telemetry was personally reviewed and demonstrates: On 6/30, she was taken off telemetry at 1606 in sinus rhythm.  On telemetry replaced at 1620 he was noted to be in A. fib with RVR with ventricular rates ranging from the 140s bpm to the 190s to low 200s bpm.  Currently in sinus rhythm with heart rates in the 80s bpm  Weights: Filed Weights   05/26/20 0500 05/27/20 0456 05/28/20 0500  Weight: 52.9 kg 52.3 kg 52.3 kg    Relevant CV Studies:  2D echo 05/22/2017:  INTERPRETATION  NORMAL LEFT VENTRICULAR SYSTOLIC FUNCTION  NORMAL RIGHT VENTRICULAR SYSTOLIC FUNCTION  MILD VALVULAR REGURGITATION (See above)  NO VALVULAR STENOSIS  Closest EF: >55% (Estimated)  Mitral: TRIVIAL MR  Tricuspid: MILD TR  __________  2D echo pending  Laboratory Data:  Chemistry Recent Labs  Lab  05/25/20 0558 05/27/20 0553 05/28/20 0417  NA 141 141 144  K 4.2 3.2* 3.6  CL 105 104 107  CO2 27 30 29   GLUCOSE 166* 111* 124*  BUN 25* 30* 32*  CREATININE 0.69 0.62 0.56  CALCIUM 8.7* 8.7* 8.6*  GFRNONAA >60 >60 >60  GFRAA >60 >60 >60  ANIONGAP 9 7 8     No results for input(s): PROT, ALBUMIN, AST, ALT, ALKPHOS, BILITOT in the last 168 hours. Hematology Recent Labs  Lab 05/25/20 0558 05/25/20 0558 05/26/20 0455 05/27/20 0553 05/28/20 0417  WBC 8.7  --   --  9.8 14.1*  RBC 2.94*  --   --  2.86* 2.78*  HGB 9.3*   < > 9.0* 9.0* 8.8*  HCT 29.1*   < > 27.9* 27.5* 27.8*  MCV 99.0  --   --  96.2 100.0  MCH 31.6  --   --  31.5 31.7  MCHC 32.0  --    --  32.7 31.7  RDW 15.5  --   --  15.6* 15.7*  PLT 391  --   --  326 296   < > = values in this interval not displayed.   Cardiac EnzymesNo results for input(s): TROPONINI in the last 168 hours. No results for input(s): TROPIPOC in the last 168 hours.  BNP Recent Labs  Lab 05/28/20 0417  BNP 4,403.0*    DDimer No results for input(s): DDIMER in the last 168 hours.  Radiology/Studies:  No results found.  Assessment and Plan:   1.  New onset A. fib with RVR: -Very brief episode lasting less than 3 hours in duration in the setting of severe acute illness, hypokalemia, and volume overload -Currently in sinus rhythm with heart rates in the 80s bpm -At baseline, patient does have a mildly bradycardic heart rate which will need to be monitored as beta-blockers have higher to previously be held -CHA2DS2-VASc at least 5 (CHF, HTN, age x1, vascular disease, gender) -Her brief episode of A. fib by itself may not necessarily qualify her for anticoagulation given it occurred in the context of the above, especially with underlying anemia which has required packed red blood cell transfusion his admission.  However, with her recurrent DVT history she will likely need to be placed back on anticoagulation when felt to be safe nonetheless -Recommend internal medicine input regarding timing of resumption of Xarelto for recurrent DVT -Check TSH -Replete potassium to goal 4.0  2. Acute hypoxic respiratory failure: -Multifactorial including PNA, bilateral pleural effusions, acute on chronic HFpEF, and anemia -She remains on supplemental oxygen via nasal cannula at 3 L, wean as able per internal medicine  3. Acute on chronic HFpEF: -She remains volume up -Check echo to evaluate for new cardiomyopathy  -If she is found to have new cardiomyopathy, given her comorbid conditions and acute illness remains uncertain if she will be an invasive ischemic evaluation candidate -Escalate diuresis with titration of  IV Lasix to 40 mg twice daily -Strict I/O -Daily weights  4. Sepsis with PNA and UTI: -Cannot exclude component of septic shock initially with BP in the 60s mmHg in the field  -Improved -Management per IM  5. Anemia/positive FOBT: -Improved -Status post pRBC -Xarelto has been held with acute anemia -Seen by GI with no intervention needed at this time -Management per IM  6. Hypotension: -Multifactorial including sepsis, hypoalbuminemia, anemia, and malnutrition  -Improved -Recommend weaning midodrine   7.  History of recurrent DVT: -Xarelto held as above -Resume per  IM   For questions or updates, please contact [place] Please consult [email or web address] for contact info under Cardiology/STEMI.   Signed, Christell Faith, PA-C [place] Pager: [phone] 05/28/2020, 11:26 AM

## 2020-05-28 NOTE — Progress Notes (Signed)
Patient's daughter and son arrived around 74 on 05/27/20, daughter stated she will be back in the am to speak with doctor.  Pt took night time medication crushed w/AS with no problem. Ate yogurt.  Pt's HR within normal range, 66-90, through the night. Amiodarone held, as pt no longer needed medication.  No signs of distress noted. Pt O2 Sats 96% on  3L.  Scheduled Oxycodone and SoluMedrol administered.

## 2020-05-28 NOTE — Progress Notes (Addendum)
Daily Progress Note   Patient Name: Cynthia Price       Date: 05/28/2020 DOB: [birth year]  Age: 74 y.o. MRN#: [id number] Attending Physician: Sharen Hones, MD Primary Care Physician: [name], MD Admit Date: 05/18/2020  Reason for Consultation/Follow-up: Establishing goals of care  Subjective: Patient is resting in bed with daughter at bedside. She states her full name, that she is at the hospital, and the year is 2021, and that she has been told she has pneumonia which is noted in hospitalist notes. Patient states she is happy with her care and wants to continue to try to treat the treatable.She said she would never want a feeding tube, and confirmed DNR/DNI status.  We discussed the HPOA papers and living will. Daughter states she printed off the papers, and helped her mother fill them out and they had them notarized. Patient agrees with this.   Pulmonology came in to speak with family about her tenuous status. Resumed [place] conversation after he left, and spoke to them about their feelings on the conversation. Patient states she feels like her breathing has not changed since admission, and her QOL will not be acceptable. We discussed her poor PO intake. We discussed medication to raise patient's BP, and diuresis which lowers it. She states "you don't have to do all that." Daughter asked what that meant, she stated "because I'm ready to die." She states she wants to be with her family for what time she has left, and she is unable to do that at her facility.  Daughter asked her if she understood the situation, and patient said "yes... and I'm ready to die."  Discussed for today continuing care without escalation so she can spend time with family, and transition to comfort care in the morning.  She was asked what would happen when she was shifted to comfort care, she said "I'll die." She was asked why earlier she stated she was happy with her care and that she wanted to treat the treatable. She advised that it was for her daughter. Daughter confirmed she wants to honor her mother's wishes for whatever she wants.    Length of Stay: 9  Current Medications: Scheduled Meds:  . amiodarone  200 mg Oral Daily  . baclofen  20 mg Oral TID  . diclofenac sodium  4 g Topical QID  . escitalopram  20 mg Oral Daily  . famotidine  20 mg Oral BID  . feeding supplement (ENSURE ENLIVE)  237 mL Oral BID BM  . furosemide  40 mg Intravenous Q12H  . gabapentin  600 mg Oral QID  . guaiFENesin  600 mg Oral BID  . hydrocortisone sod succinate (SOLU-CORTEF) inj  50 mg Intravenous Q8H  . hydroxyurea  500 mg Oral Q MTWThF  . loratadine  10 mg Oral Daily  . magnesium oxide  400 mg Oral BID AC & HS  . midodrine  5 mg Oral TID WC  . multivitamin with minerals  1 tablet Oral Daily  . oxyCODONE  5 mg Oral Q6H  . potassium chloride  40 mEq Oral BID  . sodium chloride flush  3 mL Intravenous Q12H  . tiZANidine  4 mg Oral TID  . traZODone  75 mg Oral QHS    Continuous Infusions: . sodium chloride 250 mL (05/25/20 0914)  . albumin human 12.5 g (05/28/20 0918)  . sodium chloride      PRN Meds: sodium chloride, acetaminophen **OR** acetaminophen, levalbuterol, magnesium hydroxide, metoCLOPramide, ondansetron **OR** ondansetron (ZOFRAN) IV, oxyCODONE, sodium chloride  Physical Exam Pulmonary:     Effort: Pulmonary effort is normal.  Neurological:     Mental Status: She is alert.             Vital Signs: BP 129/74 (BP Location: Left Arm)   Pulse 67   Temp 98.3 F (36.8 C)   Resp 18   Ht 5\' 5"  (1.651 m)   Wt 52.3 kg   SpO2 94%   BMI 19.20 kg/m  SpO2: SpO2: 94 % O2 Device: O2 Device: Room Air O2 Flow Rate: O2 Flow Rate (L/min): 3 L/min  Intake/output summary:   Intake/Output Summary (Last  24 hours) at 05/28/2020 1321 Last data filed at 05/28/2020 1142 Gross per 24 hour  Intake 572.4 ml  Output 550 ml  Net 22.4 ml   LBM: Last BM Date: 05/27/20 Baseline Weight: Weight: 56.7 kg Most recent weight: Weight: 52.3 kg       Palliative Assessment/Data:      Patient Active Problem List   Diagnosis Date Noted  . Acute hypoxemic respiratory failure ([place]) 05/28/2020  . Protein-calorie malnutrition, severe 05/26/2020  . Pressure injury of skin 05/22/2020  . Sepsis ([place]) 05/19/2020  . Left shoulder pain 04/16/2020  . Insomnia 05/02/2019  . Neurogenic pain 12/17/2018  . Palliative care encounter 12/13/2018  . Shortness of breath 12/13/2018  . Weakness generalized 12/13/2018  . 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 Abnormal MRI, cervical spine (07/27/2018) 10/09/2018  . Cervical central spinal stenosis 10/09/2018  . Cervical foraminal stenosis 10/09/2018  . Cervical facet arthropathy 10/09/2018  . Cervicalgia 10/09/2018  . Cervical facet syndrome (Bilateral) (R>L) 10/09/2018  . Chronic upper extremity pain (Secondary Area of Pain) (Bilateral) (R>L) 09/17/2018  . Long term current use of anticoagulant therapy (Xarelto) 09/17/2018  . DDD (degenerative disc disease), cervical 09/17/2018  . Chronic neck pain (Primary Area of Pain) (Bilateral) (R>L) 08/27/2018  . Chronic low back pain [facility] Area of Pain) (Bilateral) (L>R) w/ sciatica (Bilateral) 08/27/2018  . Chronic knee pain (Fourth Area of Pain) (Bilateral) (L>R) 08/27/2018  . Chronic lower extremity pain (Bilateral) (L>R) 08/27/2018  . Chronic pain syndrome 08/27/2018  . Pharmacologic therapy 08/27/2018  . Disorder of skeletal system 08/27/2018  . Problems influencing health status 08/27/2018  . Long term current use of opiate  analgesic 08/27/2018  . Degenerative cervical spinal stenosis 08/06/2018  . Resides in skilled nursing facility 08/06/2018  . Rash 06/19/2018  . Muscle spasticity 05/17/2018  . Inability to walk  05/17/2018  . Mild protein-calorie malnutrition ([place]) 01/03/2018  . Ulcer, skin, non-healing, limited to breakdown of skin ([place]) 12/19/2017  . Intractable pain 08/19/2017  . Chronic deep vein thrombosis (DVT) of proximal vein of left lower extremity ([place]) 05/16/2017  . Seizure-like activity ([place]) 04/20/2017  . Bladder mass 01/20/2017  . Encounter for general adult medical examination without abnormal findings 11/24/2016  . Spondylosis of lumbar region without myelopathy or radiculopathy 05/23/2016  . 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 Urinary retention with incomplete bladder emptying 12/22/2015  . ET (essential thrombocythemia) ([place]) 12/22/2015  . DS (disseminated sclerosis) ([place]) 09/15/2015  . Rotator cuff tendinitis (Right) 06/19/2015  . Back sprain 06/05/2015  . Chest wall contusion 06/05/2015  . Effusion of knee 05/22/2015  . Arthritis of knee, degenerative 05/22/2015  . Effusion of left knee 05/22/2015  . Osteoarthritis of knee (Left) 05/22/2015  . Contusion of shoulder 04/14/2015  . 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[place]) 03/05/2015  . Mild pulmonary hypertension ([place]) 03/05/2015  . Complete rotator cuff rupture of shoulder (Left) 02/05/2015  . Infraspinatus tenosynovitis 02/05/2015  . Complete tear of left rotator cuff 02/05/2015  . Acute on chronic diastolic CHF (congestive heart failure) ([place]) 09/07/2014  . Deep vein thrombosis ([place]) 07/10/2014  . History of deep vein thrombosis (DVT) of lower extremity 07/10/2014  . HLD (hyperlipidemia) 03/08/2014  . BP (high blood pressure) 03/08/2014  . Hyperlipidemia 03/08/2014  . Hypertension 03/08/2014  . UTI (urinary tract infection) 02/05/2014  . Cellulitis, toe 08/18/2013  . Bradycardia 03/26/2013  . Hypotension 03/26/2013  . RLS (restless legs syndrome) 08/31/2012  . Constipation 07/20/2012  . Prolapse of urethra 07/20/2012  . Frequent UTI 07/20/2012  . Neurogenic dysfunction of the urinary bladder 07/20/2012  .  Vaginal atrophy 07/20/2012  . Recurrent UTI 07/20/2012  . Neuromuscular dysfunction of bladder 07/20/2012  . Prolapse urethral mucosa 07/20/2012  . DVT (deep venous thrombosis) ([place]) 05/14/2012  . Risk for falls 03/16/2012  . Lumbar canal stenosis 02/16/2012  . Lumbosacral radiculitis 02/16/2012  . Restless leg 01/20/2012  . 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 Restless legs syndrome (RLS) 01/20/2012  . Urinary retention 12/04/2011  . CAD (coronary artery disease) 10/18/2011  . Anemia 10/17/2011  . Diastolic dysfunction 16/11/930  . Tachycardia 10/01/2011  . Thrombocythemia, essential ([place]) 05/26/2011  . Edema 05/26/2011  . HYPERCHOLESTEROLEMIA 04/29/2010  . Depression 04/29/2010  . CYSTOCELE WITHOUT MENTION UTERINE PROLAPSE LAT 04/29/2010  . UNS ADVRS EFF OTH RX MEDICINAL&BIOLOGICAL SBSTNC 04/29/2010  . Depressive disorder, not elsewhere classified 04/29/2010  . Vitamin D deficiency 04/29/2010  . Multiple sclerosis ([place]) 04/29/2008  . NEUROPATHY 04/29/2008  . FATIGUE 04/29/2008    Palliative Care Assessment & Plan    Recommendations/Plan:  Continue care today without escalation. Transition to comfort care tomorrow morning.   Family discussing hospice at home or facility.     Code Status:    Code Status Orders  (From admission, onward)         Start     Ordered   05/19/20 0527  Do not attempt resuscitation (DNR)  Continuous       Question Answer Comment  In the event of cardiac or respiratory ARREST Do not call a "code blue"   In the event of cardiac or respiratory ARREST Do not perform Intubation, CPR,  defibrillation or ACLS   In the event of cardiac or respiratory ARREST Use medication by any route, position, wound care, and other measures to relive pain and suffering. May use oxygen, suction and manual treatment of airway obstruction as needed for comfort.      05/19/20 0526        Code Status History    Date Active Date Inactive  Code Status Order ID Comments User Context   05/19/2020 0451 05/19/2020 0526 Full Code [id number]  Christel Mormon, MD ED   02/02/2019 0631 02/04/2019 1819 DNR [id number]  [name], MD Inpatient   08/19/2017 2055 08/22/2017 2245 DNR [id number]  Nicholes Mango, MD Inpatient   Advance Care Planning Activity    Advance Directive Documentation     Most Recent Value  Type of Advance Directive Out of facility DNR (pink MOST or yellow form)  Pre-existing out of facility DNR order (yellow form or pink MOST form) Yellow form placed in chart (order not valid for inpatient use)  "MOST" Form in Place? --       Prognosis:   < 2 weeks   Care plan was discussed with RN  Thank you for allowing the Palliative Medicine Team to assist in the care of this patient.   Total Time 35 min Prolonged Time Billed no      Greater than 50%  of this time was spent counseling and coordinating care related to the above assessment and plan.  Asencion Gowda, NP  Please contact Palliative Medicine Team phone at [phone] for questions and concerns.

## 2020-05-29 DIAGNOSIS — G35 Multiple sclerosis: Secondary | ICD-10-CM

## 2020-05-29 LAB — CBC WITH DIFFERENTIAL/PLATELET
Abs Immature Granulocytes: 0.12 10*3/uL — ABNORMAL HIGH (ref 0.00–0.07)
Basophils Absolute: 0 10*3/uL (ref 0.0–0.1)
Basophils Relative: 0 %
Eosinophils Absolute: 0 10*3/uL (ref 0.0–0.5)
Eosinophils Relative: 0 %
HCT: 29.5 % — ABNORMAL LOW (ref 36.0–46.0)
Hemoglobin: 9.7 g/dL — ABNORMAL LOW (ref 12.0–15.0)
Immature Granulocytes: 1 %
Lymphocytes Relative: 9 %
Lymphs Abs: 1.2 10*3/uL (ref 0.7–4.0)
MCH: 31.9 pg (ref 26.0–34.0)
MCHC: 32.9 g/dL (ref 30.0–36.0)
MCV: 97 fL (ref 80.0–100.0)
Monocytes Absolute: 0.6 10*3/uL (ref 0.1–1.0)
Monocytes Relative: 5 %
Neutro Abs: 10.7 10*3/uL — ABNORMAL HIGH (ref 1.7–7.7)
Neutrophils Relative %: 85 %
Platelets: 357 10*3/uL (ref 150–400)
RBC: 3.04 MIL/uL — ABNORMAL LOW (ref 3.87–5.11)
RDW: 15.9 % — ABNORMAL HIGH (ref 11.5–15.5)
WBC: 12.6 10*3/uL — ABNORMAL HIGH (ref 4.0–10.5)
nRBC: 0 % (ref 0.0–0.2)

## 2020-05-29 LAB — BASIC METABOLIC PANEL
Anion gap: 12 (ref 5–15)
BUN: 27 mg/dL — ABNORMAL HIGH (ref 8–23)
CO2: 33 mmol/L — ABNORMAL HIGH (ref 22–32)
Calcium: 9 mg/dL (ref 8.9–10.3)
Chloride: 100 mmol/L (ref 98–111)
Creatinine, Ser: 0.61 mg/dL (ref 0.44–1.00)
GFR calc Af Amer: >60 mL/min (ref >60)
GFR calc non Af Amer: >60 mL/min (ref >60)
Glucose, Bld: 106 mg/dL — ABNORMAL HIGH (ref 70–99)
Potassium: 3.4 mmol/L — ABNORMAL LOW (ref 3.5–5.1)
Sodium: 145 mmol/L (ref 135–145)

## 2020-05-29 LAB — MAGNESIUM: Magnesium: 2.2 mg/dL (ref 1.7–2.4)

## 2020-05-29 MED ORDER — ONDANSETRON 4 MG PO TBDP
4.0000 mg | ORAL_TABLET | Freq: Four times a day (QID) | ORAL | Status: DC | PRN
  Filled 2020-05-29: qty 1

## 2020-05-29 MED ORDER — LORAZEPAM 2 MG/ML PO CONC
1.0000 mg | ORAL | Status: DC | PRN
  Filled 2020-05-29: qty 0.5

## 2020-05-29 MED ORDER — HALOPERIDOL 0.5 MG PO TABS
0.5000 mg | ORAL_TABLET | ORAL | Status: DC | PRN
  Filled 2020-05-29: qty 1

## 2020-05-29 MED ORDER — GLYCOPYRROLATE 1 MG PO TABS
1.0000 mg | ORAL_TABLET | ORAL | Status: DC | PRN
  Filled 2020-05-29: qty 1

## 2020-05-29 MED ORDER — BIOTENE DRY MOUTH MT LIQD: 15.0000 mL | OROMUCOSAL | Status: DC | PRN

## 2020-05-29 MED ORDER — SODIUM CHLORIDE 0.9% FLUSH: 3.0000 mL | INTRAVENOUS | Status: DC | PRN

## 2020-05-29 MED ORDER — LORAZEPAM 2 MG/ML IJ SOLN: 0.5000 mg | INTRAMUSCULAR | Status: DC | PRN

## 2020-05-29 MED ORDER — ACETAMINOPHEN 650 MG RE SUPP: 650.0000 mg | Freq: Four times a day (QID) | RECTAL | Status: DC | PRN

## 2020-05-29 MED ORDER — HALOPERIDOL LACTATE 2 MG/ML PO CONC
0.5000 mg | ORAL | Status: DC | PRN
  Filled 2020-05-29: qty 0.3

## 2020-05-29 MED ORDER — SODIUM CHLORIDE 0.9% FLUSH
3.0000 mL | Freq: Two times a day (BID) | INTRAVENOUS | Status: DC
  Administered 2020-05-29 – 2020-06-03 (×8): 3 mL via INTRAVENOUS

## 2020-05-29 MED ORDER — ONDANSETRON HCL 4 MG/2ML IJ SOLN: 4.0000 mg | Freq: Four times a day (QID) | INTRAMUSCULAR | Status: DC | PRN

## 2020-05-29 MED ORDER — OXYCODONE HCL 5 MG PO TABS
5.0000 mg | ORAL_TABLET | Freq: Four times a day (QID) | ORAL | Status: DC | PRN
  Administered 2020-05-31 (×2): 5 mg via ORAL
  Filled 2020-05-29 (×2): qty 1

## 2020-05-29 MED ORDER — LORAZEPAM 1 MG PO TABS
1.0000 mg | ORAL_TABLET | ORAL | Status: DC | PRN
  Administered 2020-06-01: 1 mg via ORAL
  Filled 2020-05-29: qty 1

## 2020-05-29 MED ORDER — ACETAMINOPHEN 325 MG PO TABS
650.0000 mg | ORAL_TABLET | Freq: Four times a day (QID) | ORAL | Status: DC | PRN
  Administered 2020-05-30: 650 mg via ORAL
  Filled 2020-05-29: qty 2

## 2020-05-29 MED ORDER — MORPHINE SULFATE (PF) 2 MG/ML IV SOLN
1.0000 mg | INTRAVENOUS | Status: DC | PRN
  Administered 2020-05-29: 1 mg via INTRAVENOUS
  Filled 2020-05-29 (×2): qty 1

## 2020-05-29 MED ORDER — GLYCOPYRROLATE 0.2 MG/ML IJ SOLN
0.2000 mg | INTRAMUSCULAR | Status: DC | PRN
  Filled 2020-05-29: qty 1

## 2020-05-29 MED ORDER — CYCLOBENZAPRINE HCL 10 MG PO TABS
5.0000 mg | ORAL_TABLET | Freq: Three times a day (TID) | ORAL | Status: DC | PRN
  Administered 2020-05-29 – 2020-06-03 (×8): 5 mg via ORAL
  Filled 2020-05-29 (×8): qty 1

## 2020-05-29 MED ORDER — MORPHINE SULFATE (PF) 2 MG/ML IV SOLN: 1.0000 mg | INTRAVENOUS | Status: DC | PRN

## 2020-05-29 MED ORDER — HALOPERIDOL LACTATE 5 MG/ML IJ SOLN: 0.5000 mg | INTRAMUSCULAR | Status: DC | PRN

## 2020-05-29 MED ORDER — POLYVINYL ALCOHOL 1.4 % OP SOLN
1.0000 [drp] | Freq: Four times a day (QID) | OPHTHALMIC | Status: DC | PRN
  Filled 2020-05-29: qty 15

## 2020-05-29 MED ORDER — POTASSIUM CHLORIDE 20 MEQ PO PACK
40.0000 meq | PACK | Freq: Once | ORAL | Status: AC
  Administered 2020-05-29: 40 meq via ORAL
  Filled 2020-05-29: qty 2

## 2020-05-29 NOTE — Progress Notes (Signed)
[place] visited pt. as follow-up from yesterday's visit and per RN suggestion for family support.  Pt. lying down in bed w/dtr. Cindy sitting in chair at bedside.  Pt. said she had just been given some morphine and muscle relaxant; dtr. helping pt. eat some berries with a spoon --> chatting w/pt. pleasantly.  Pt. grateful for [facility] follow-up; said she is a bit sleepy today and still experiencing pain in her leg.  Pt. has been encouraged by having numerous people from her church visit her in hospital; this was not possible per COVID precautions this last year while pt. was living at [facility].  [place] visited w/pt. and dtr. for a little while until pt. said she was becoming sleepy and seemed to want to rest.  If pt. is here Sunday, [facility] said he will try to come by for another follow-up visit.  Per dtr. plan is to wait for bed at residential hospice.    05/29/20 1300  Clinical Encounter Type  Visited With Patient and family together  Visit Type Follow-up;Psychological support;Social support  Referral From Nurse  Spiritual Encounters  Spiritual Needs Emotional  Stress Factors  Patient Stress Factors Major life changes;Health changes (Pain in leg)  Family Stress Factors Health changes

## 2020-05-29 NOTE — Progress Notes (Addendum)
Daily Progress Note   Patient Name: Cynthia Price       Date: 05/29/2020 DOB: Apr 01, 1946  Age: 74 y.o. MRN#: [id number] Attending Physician: Sharen Hones, MD Primary Care Physician: [name], MD Admit Date: 05/18/2020  Reason for Consultation/Follow-up: Establishing goals of care  Subjective: Patient is resting in bed with daughter at bedside. Primary MD speaking with family. Daughter has concern patient may be undecided on her decision for comfort as she was so happy to be with her family yesterday. Inquired as to their plans moving forward as primary MD brought the question forward. Daughter states patient told her she wanted to go to the hospice facility and not to have hospice at her son's home. Daughter stated "she acutally doesn't want to go home, she wants to go to hospice." She advises her brother recently moved into where he is living and it is not set up for her.   Spoke with patient and daughter. Discussed aggressive care as well as comfort route. Patient says she wants hospice. She states she is ready to die. She states that she is happy to spend time with her family, but she is ready to go. Reflected this statement back to her and she said "yes".  Asked also, do you just want to be kept comfortable until you die and she said "yes." Discussed stopping life prolonging interventions and she agreed. Daughter asked several questions to clarify patient's wishes for comfort care until she dies. Daughter was satisfied with the interaction.  I completed a MOST form today and the signed original was placed in the chart. The form was signed by daughter as patient cannot move her arms or hands 2/2 MS. A photocopy was placed in the chart to be scanned into EMR. The patient outlined their  wishes for the following treatment decisions:  Cardiopulmonary Resuscitation: Do Not Attempt Resuscitation (DNR/No CPR)  Medical Interventions: Comfort Measures: Keep clean, warm, and dry. Use medication by any route, positioning, wound care, and other measures to relieve pain and suffering. Use oxygen, suction and manual treatment of airway obstruction as needed for comfort. Do not transfer to the hospital unless comfort needs cannot be met in current location.  Antibiotics: No antibiotics (use other measures to relieve symptoms)  IV Fluids: No IV fluids (provide other measures to ensure comfort)  Feeding Tube: No feeding tube          Length of Stay: 10  Current Medications: Scheduled Meds:  . amiodarone  200 mg Oral Daily  . baclofen  20 mg Oral TID  . diclofenac sodium  4 g Topical QID  . escitalopram  20 mg Oral Daily  . famotidine  20 mg Oral BID  . feeding supplement (ENSURE ENLIVE)  237 mL Oral BID BM  . furosemide  40 mg Intravenous Q12H  . gabapentin  600 mg Oral QID  . guaiFENesin  600 mg Oral BID  . hydrocortisone sod succinate (SOLU-CORTEF) inj  50 mg Intravenous Q8H  . hydroxyurea  500 mg Oral Q MTWThF  . loratadine  10 mg Oral Daily  . magnesium oxide  400 mg Oral BID AC & HS  . midodrine  5 mg Oral TID WC  . multivitamin with minerals  1 tablet Oral Daily  . oxyCODONE  5 mg Oral Q6H  . sodium chloride flush  3 mL Intravenous Q12H  . tiZANidine  4 mg Oral TID  . traZODone  75 mg Oral QHS    Continuous Infusions: . sodium chloride 250 mL (05/25/20 0914)  . albumin human 12.5 g (05/29/20 0928)  . sodium chloride      PRN Meds: sodium chloride, acetaminophen **OR** acetaminophen, levalbuterol, magnesium hydroxide, metoCLOPramide, ondansetron **OR** ondansetron (ZOFRAN) IV, oxyCODONE, sodium chloride  Physical Exam Pulmonary:     Effort: Pulmonary effort is normal.  Neurological:     Mental Status: She is alert.             Vital Signs: BP 106/68  (BP Location: Right Arm)   Pulse 76   Temp 98.5 F (36.9 C) (Oral)   Resp 16   Ht _0  (1.651 m)   Wt 52.3 kg   SpO2 91%   BMI 19.20 kg/m  SpO2: SpO2: 91 % O2 Device: O2 Device: Nasal Cannula O2 Flow Rate: O2 Flow Rate (L/min): 3 L/min  Intake/output summary:   Intake/Output Summary (Last 24 hours) at 05/29/2020 1155 Last data filed at 05/29/2020 1030 Gross per 24 hour  Intake --  Output 2200 ml  Net -2200 ml   LBM: Last BM Date: 05/29/20 Baseline Weight: Weight: 56.7 kg Most recent weight: Weight: 52.3 kg       Palliative Assessment/Data: 20%      Patient Active Problem List   Diagnosis Date Noted  . Acute hypoxemic respiratory failure ([place]) 05/28/2020  . Protein-calorie malnutrition, severe 05/26/2020  . Pressure injury of skin 05/22/2020  . Sepsis ([place]) 05/19/2020  . Left shoulder pain 04/16/2020  . Insomnia 05/02/2019  . Neurogenic pain 12/17/2018  . Palliative care encounter 12/13/2018  . Shortness of breath 12/13/2018  . Weakness generalized 12/13/2018  . Spastic diplegia ([place]) 11/13/2018  . Abnormal MRI, cervical spine (07/27/2018) 10/09/2018  . Cervical central spinal stenosis 10/09/2018  . Cervical foraminal stenosis 10/09/2018  . Cervical facet arthropathy 10/09/2018  . Cervicalgia 10/09/2018  . Cervical facet syndrome (Bilateral) (R>L) 10/09/2018  . Chronic upper extremity pain (Secondary Area of Pain) (Bilateral) (R>L) 09/17/2018  . Long term current use of anticoagulant therapy (Xarelto) 09/17/2018  . DDD (degenerative disc disease), cervical 09/17/2018  . Chronic neck pain (Primary Area of Pain) (Bilateral) (R>L) 08/27/2018  . Chronic low back pain [facility] Area of Pain) (Bilateral) (L>R) w/ sciatica (Bilateral) 08/27/2018  . Chronic knee pain (Fourth Area of Pain) (Bilateral) (L>R) 08/27/2018  . Chronic lower extremity pain (  Bilateral) (L>R) 08/27/2018  . Chronic pain syndrome 08/27/2018  . Pharmacologic therapy 08/27/2018  . Disorder of  skeletal system 08/27/2018  . Problems influencing health status 08/27/2018  . Long term current use of opiate analgesic 08/27/2018  . Degenerative cervical spinal stenosis 08/06/2018  . Resides in skilled nursing facility 08/06/2018  . Rash 06/19/2018  . Muscle spasticity 05/17/2018  . Inability to walk 05/17/2018  . Mild protein-calorie malnutrition ([place]) 01/03/2018  . Ulcer, skin, non-healing, limited to breakdown of skin ([place]) 12/19/2017  . Intractable pain 08/19/2017  . Chronic deep vein thrombosis (DVT) of proximal vein of left lower extremity ([place]) 05/16/2017  . Seizure-like activity ([place]) 04/20/2017  . Bladder mass 01/20/2017  . Encounter for general adult medical examination without abnormal findings 11/24/2016  . Spondylosis of lumbar region without myelopathy or radiculopathy 05/23/2016  . 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 Urinary retention with incomplete bladder emptying 12/22/2015  . ET (essential thrombocythemia) ([place]) 12/22/2015  . DS (disseminated sclerosis) ([place]) 09/15/2015  . Rotator cuff tendinitis (Right) 06/19/2015  . Back sprain 06/05/2015  . Chest wall contusion 06/05/2015  . Effusion of knee 05/22/2015  . Arthritis of knee, degenerative 05/22/2015  . Effusion of left knee 05/22/2015  . Osteoarthritis of knee (Left) 05/22/2015  . Contusion of shoulder 04/14/2015  . 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[place]) 03/05/2015  . Mild pulmonary hypertension ([place]) 03/05/2015  . Complete rotator cuff rupture of shoulder (Left) 02/05/2015  . Infraspinatus tenosynovitis 02/05/2015  . Complete tear of left rotator cuff 02/05/2015  . Acute on chronic diastolic CHF (congestive heart failure) ([place]) 09/07/2014  . Deep vein thrombosis ([place]) 07/10/2014  . History of deep vein thrombosis (DVT) of lower extremity 07/10/2014  . HLD (hyperlipidemia) 03/08/2014  . BP (high blood pressure) 03/08/2014  . Hyperlipidemia 03/08/2014  . Hypertension 03/08/2014  . UTI (urinary  tract infection) 02/05/2014  . Cellulitis, toe 08/18/2013  . Bradycardia 03/26/2013  . Hypotension 03/26/2013  . RLS (restless legs syndrome) 08/31/2012  . Constipation 07/20/2012  . Prolapse of urethra 07/20/2012  . Frequent UTI 07/20/2012  . Neurogenic dysfunction of the urinary bladder 07/20/2012  . Vaginal atrophy 07/20/2012  . Recurrent UTI 07/20/2012  . Neuromuscular dysfunction of bladder 07/20/2012  . Prolapse urethral mucosa 07/20/2012  . DVT (deep venous thrombosis) ([place]) 05/14/2012  . Risk for falls 03/16/2012  . Lumbar canal stenosis 02/16/2012  . Lumbosacral radiculitis 02/16/2012  . Restless leg 01/20/2012  . 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 Restless legs syndrome (RLS) 01/20/2012  . Urinary retention 12/04/2011  . CAD (coronary artery disease) 10/18/2011  . Anemia 10/17/2011  . Diastolic dysfunction 40/06/6760  . Tachycardia 10/01/2011  . Thrombocythemia, essential ([place]) 05/26/2011  . Edema 05/26/2011  . HYPERCHOLESTEROLEMIA 04/29/2010  . Depression 04/29/2010  . CYSTOCELE WITHOUT MENTION UTERINE PROLAPSE LAT 04/29/2010  . UNS ADVRS EFF OTH RX MEDICINAL&BIOLOGICAL SBSTNC 04/29/2010  . Depressive disorder, not elsewhere classified 04/29/2010  . Vitamin D deficiency 04/29/2010  . Multiple sclerosis ([place]) 04/29/2008  . NEUROPATHY 04/29/2008  . FATIGUE 04/29/2008    Palliative Care Assessment & Plan    Recommendations/Plan:  Comfort care.  Recommend hospice facility.       Code Status:    Code Status Orders  (From admission, onward)         Start     Ordered   05/19/20 0527  Do not attempt resuscitation (DNR)  Continuous       Question Answer Comment  In the event of cardiac  or respiratory ARREST Do not call a "code blue"   In the event of cardiac or respiratory ARREST Do not perform Intubation, CPR, defibrillation or ACLS   In the event of cardiac or respiratory ARREST Use medication by any route, position, wound  care, and other measures to relive pain and suffering. May use oxygen, suction and manual treatment of airway obstruction as needed for comfort.      05/19/20 0526        Code Status History    Date Active Date Inactive Code Status Order ID Comments User Context   05/19/2020 0451 05/19/2020 0526 Full Code [id number]  Christel Mormon, MD ED   02/02/2019 0631 02/04/2019 1819 DNR [id number]  [name], MD Inpatient   08/19/2017 2055 08/22/2017 2245 DNR [id number]  Nicholes Mango, MD Inpatient   Advance Care Planning Activity    Advance Directive Documentation     Most Recent Value  Type of Advance Directive Out of facility DNR (pink MOST or yellow form)  Pre-existing out of facility DNR order (yellow form or pink MOST form) Yellow form placed in chart (order not valid for inpatient use)  "MOST" Form in Place? --       Prognosis:   < 2 weeks Hypotension that has caused need for Midodrine. Sepsis with UTI and PNA. PNA and pleural effusion. CHF. Poor PO intake. Concern for symptom management needs as patient does not feel that she has improved from a respiratory standpoint since admission.    Care plan was discussed with RN  Thank you for allowing the Palliative Medicine Team to assist in the care of this patient.   Total Time 10:30-11:40 70 min Prolonged Time Billed yes      Greater than 50%  of this time was spent counseling and coordinating care related to the above assessment and plan.  Asencion Gowda, NP  Please contact Palliative Medicine Team phone at [phone] for questions and concerns.

## 2020-05-29 NOTE — Progress Notes (Signed)
Pulmonary Medicine          Date: 05/29/2020,   MRN# [id number] KATY BRICKELL 09-Sep-1946     AdmissionWeight: 56.7 kg                 CurrentWeight: 52.3 kg   Referring physician: Dr Ree Kida   CHIEF COMPLAINT:   Loculated pleural effusion concerning for empyema   SUBJECTIVE   Patient resting in bed.  Daughter [name] at bedside, additionally Mrs Truddie Crumble (patients friend) came to visit her.   Patient is on comfort measures, s/p morphine and cyclobenzaprine and states she if much improved.   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[facility])    Hyperlipidemia    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[place]) 03/05/2015   Hypotension 03/26/2013   IBS (irritable bowel syndrome)    Insomnia    Lupus anticoagulant positive    Macular degeneration    Multiple sclerosis ([place]) 04/29/2008   Per neurology at [place] 04/29/2008   Per Neurology at [facility]     RLS (restless legs syndrome)    Spinal stenosis    lumbar   Urinary retention    with high post void residuals 2013, per [facility] u[facility]     SURGICAL HISTORY   Past Surgical History:  Procedure Laterality Date   ANTERIOR LUMBAR FUSION  2012   APPENDECTOMY     CHOLECYSTECTOMY     CHOLECYSTECTOMY     LUMBAR [place] SURGERY     LUMBAR LAMINECTOMY     TONSILLECTOMY       FAMILY HISTORY   Family History  Problem Relation Age of Onset   Heart disease Mother    Thyroid cancer Mother    Ovarian cancer Mother    Alcohol  abuse Father    Heart disease Father    Diabetes Sister    Diabetes Brother    Multiple sclerosis Paternal Aunt    Multiple sclerosis Cousin      SOCIAL HISTORY   Social History   Tobacco Use   Smoking status: Never Smoker   Smokeless tobacco: Never Used  Substance Use Topics   Alcohol use: No   Drug use: No     MEDICATIONS    Home Medication:    Current Medication:  Current Facility-Administered Medications:    acetaminophen (TYLENOL) tablet 650 mg, 650 mg, Oral, Q6H PRN **OR** acetaminophen (TYLENOL) suppository 650 mg, 650 mg, Rectal, Q6H PRN, Laurann Montana, Crystal, NP   amiodarone (PACERONE) tablet 200 mg, 200 mg, Oral, Daily, [place], Dekui, MD, 200 mg at 05/29/20 0932   antiseptic oral rinse (BIOTENE) solution 15 mL, 15 mL, Topical, PRN, Asencion Gowda, NP   baclofen (LIORESAL) tablet 20 mg, 20 mg, Oral, TID, Mansy, Jan A, MD, 20 mg at 05/29/20 0930   cyclobenzaprine (FLEXERIL) tablet 5 mg, 5 mg, Oral, TID PRN, Ottie Glazier, MD   diclofenac sodium (VOLTAREN) 1 % transdermal  gel 4 g, 4 g, Topical, QID, Mansy, Jan A, MD, 4 g at 05/29/20 0931   escitalopram (LEXAPRO) tablet 20 mg, 20 mg, Oral, Daily, Mansy, Jan A, MD, 20 mg at 05/29/20 0929   famotidine (PEPCID) tablet 20 mg, 20 mg, Oral, BID, Mansy, Jan A, MD, 20 mg at 05/29/20 0930   furosemide (LASIX) injection 40 mg, 40 mg, Intravenous, Q12H, Dunn, Ryan M, PA-C, 40 mg at 05/29/20 0925   gabapentin (NEURONTIN) tablet 600 mg, 600 mg, Oral, QID, Mansy, Jan A, MD, 600 mg at 05/29/20 0932   glycopyrrolate (ROBINUL) tablet 1 mg, 1 mg, Oral, Q4H PRN **OR** glycopyrrolate (ROBINUL) injection 0.2 mg, 0.2 mg, Subcutaneous, Q4H PRN **OR** glycopyrrolate (ROBINUL) injection 0.2 mg, 0.2 mg, Intravenous, Q4H PRN, Asencion Gowda, NP   guaiFENesin (MUCINEX) 12 hr tablet 600 mg, 600 mg, Oral, BID, Mansy, Jan A, MD, 600 mg at 05/29/20 0930   haloperidol (HALDOL) tablet 0.5 mg, 0.5 mg, Oral, Q4H PRN **OR**  haloperidol (HALDOL) 2 MG/ML solution 0.5 mg, 0.5 mg, Sublingual, Q4H PRN **OR** haloperidol lactate (HALDOL) injection 0.5 mg, 0.5 mg, Intravenous, Q4H PRN, Laurann Montana, Crystal, NP   hydrocortisone sodium succinate (SOLU-CORTEF) 100 MG injection 50 mg, 50 mg, Intravenous, Q8H, Mikhail, Maryann, DO, 50 mg at 05/29/20 1242   levalbuterol (XOPENEX) nebulizer solution 0.63 mg, 0.63 mg, Nebulization, Q6H PRN, Sharen Hones, MD, 0.63 mg at 05/28/20 1352   loratadine (CLARITIN) tablet 10 mg, 10 mg, Oral, Daily, Mansy, Jan A, MD, 10 mg at 05/29/20 0931   LORazepam (ATIVAN) tablet 1 mg, 1 mg, Oral, Q4H PRN **OR** LORazepam (ATIVAN) 2 MG/ML concentrated solution 1 mg, 1 mg, Sublingual, Q4H PRN **OR** LORazepam (ATIVAN) injection 0.5 mg, 0.5 mg, Intravenous, Q4H PRN, Laurann Montana, Crystal, NP   metoCLOPramide (REGLAN) tablet 5 mg, 5 mg, Oral, Q6H PRN, Mansy, Jan A, MD, 5 mg at 05/23/20 1551   morphine 2 MG/ML injection 1-2 mg, 1-2 mg, Intravenous, Q1H PRN, Laurann Montana, Crystal, NP, 1 mg at 05/29/20 1242   ondansetron (ZOFRAN) tablet 4 mg, 4 mg, Oral, Q6H PRN **OR** ondansetron (ZOFRAN) injection 4 mg, 4 mg, Intravenous, Q6H PRN, Mansy, Jan A, MD, 4 mg at 05/23/20 0629   ondansetron (ZOFRAN-ODT) disintegrating tablet 4 mg, 4 mg, Oral, Q6H PRN **OR** ondansetron (ZOFRAN) injection 4 mg, 4 mg, Intravenous, Q6H PRN, Laurann Montana, Crystal, NP   oxyCODONE (Oxy IR/ROXICODONE) immediate release tablet 5 mg, 5 mg, Oral, Q6H, Mansy, Jan A, MD, 5 mg at 05/29/20 1046   oxyCODONE (Oxy IR/ROXICODONE) immediate release tablet 5 mg, 5 mg, Oral, Q6H PRN, Laurann Montana, Crystal, NP   polyvinyl alcohol (LIQUIFILM TEARS) 1.4 % ophthalmic solution 1 drop, 1 drop, Both Eyes, QID PRN, Laurann Montana, Crystal, NP   sodium chloride (OCEAN) 0.65 % nasal spray 1 spray, 1 spray, Each Nare, PRN, Cristal Ford, DO, 1 spray at 05/23/20 2124   sodium chloride 0.9 % bolus 250 mL, 250 mL, Intravenous, Once, Cristal Ford, DO   sodium chloride flush (NS)  0.9 % injection 3 mL, 3 mL, Intravenous, Q12H, Mikhail, [place], DO, 3 mL at 05/29/20 0933   sodium chloride flush (NS) 0.9 % injection 3 mL, 3 mL, Intravenous, Q12H, Griffin, Crystal, NP   sodium chloride flush (NS) 0.9 % injection 3 mL, 3 mL, Intravenous, PRN, Laurann Montana, Crystal, NP   tiZANidine (ZANAFLEX) tablet 4 mg, 4 mg, Oral, TID, Mansy, Jan A, MD, 4 mg at 05/29/20 0929   traZODone (DESYREL) tablet 75 mg, 75 mg, Oral, QHS, Mansy, Jan A, MD, 75 mg  at 05/28/20 2236    ALLERGIES   Atorvastatin     REVIEW OF SYSTEMS    Review of Systems:  Gen:  Denies  fever, sweats, chills weigh loss  HEENT: Denies blurred vision, double vision, ear pain, eye pain, hearing loss, nose bleeds, sore throat Cardiac:  No dizziness, chest pain or heaviness, chest tightness,edema Resp:   Denies cough or sputum porduction, shortness of breath,wheezing, hemoptysis,  Gi: Denies swallowing difficulty, stomach pain, nausea or vomiting, diarrhea, constipation, bowel incontinence Gu:  Denies bladder incontinence, burning urine Ext:   Denies Joint pain, stiffness or swelling Skin: Denies  skin rash, easy bruising or bleeding or hives Endoc:  Denies polyuria, polydipsia , polyphagia or weight change Psych:   Denies depression, insomnia or hallucinations   Other:  All other systems negative   VS: BP 106/68 (BP Location: Right Arm)    Pulse 76    Temp 98.5 F (36.9 C) (Oral)    Resp 16    Ht 5\' 5"  (1.651 m)    Wt 52.3 kg    SpO2 91%    BMI 19.20 kg/m      PHYSICAL EXAM    GENERAL:NAD, no fevers, chills, no weakness no fatigue HEAD: Normocephalic, atraumatic.  EYES: Pupils equal, round, reactive to light. Extraocular muscles intact. No scleral icterus.  MOUTH: Moist mucosal membrane. Dentition intact. No abscess noted.  EAR, NOSE, THROAT: Clear without exudates. No external lesions.  NECK: Supple. No thyromegaly. No nodules. No JVD.  PULMONARY: Decreased breath sounds bilaterally with rhochi.    CARDIOVASCULAR: S1 and S2. Regular rate and rhythm. No murmurs, rubs, or gallops. No edema. Pedal pulses 2+ bilaterally.  GASTROINTESTINAL: Soft, nontender, nondistended. No masses. Positive bowel sounds. No hepatosplenomegaly.  MUSCULOSKELETAL: No swelling, clubbing, or edema. Range of motion full in all extremities.  NEUROLOGIC: Cranial nerves II through XII are intact. No gross focal neurological deficits. Sensation intact. Reflexes intact.  SKIN: No ulceration, lesions, rashes, or cyanosis. Skin warm and dry. Turgor intact.  PSYCHIATRIC: Mood, affect within normal limits. The patient is awake, alert and oriented x 3. Insight, judgment intact.       IMAGING    DG Chest 1 View  Addendum Date: 05/21/2020   ADDENDUM REPORT: 05/21/2020 06:03 ADDENDUM: Additional attempts were made to reposition the patient. With diminished patient rotation and less obscuration of the left upper lung. These images reveal increasing now completely confluent opacity in the left lung base with air bronchograms likely reflecting a combination of consolidated lung, volume loss and layering effusion. Increasing opacities are present throughout the right mid to lower lung as well which could reflect further airspace disease or edema as well as small right effusion. This addendum will be called to the ordering clinician or representative by the Radiologist Assistant, and communication documented in the PACS or [employer]. Electronically Signed   By: Lovena Le M.D.   On: 05/21/2020 06:03   Result Date: 05/21/2020 CLINICAL DATA:  Shortness of breath EXAM: CHEST  1 VIEW COMPARISON:  Radiograph 05/19/2020, CT 06/14/2015 FINDINGS: Difficulty with patient positioning with the head projecting over the left mid to upper lung obscuring much of the visible portions of the left lung itself. Additionally there is a steep right anterior obliquity. Redemonstration of some patchy and streaky right basilar opacity. More globally  increased opacity in the retrocardiac space. Obscuration of the hemidiaphragms is suggestive of developing pleural effusions. No visible right pneumothorax. Left apex obscured. Visible mediastinal contours are not significantly changed  from prior though much of the left heart border is obscured. No acute osseous or soft tissue abnormality. Chronic rib deformities are again seen. Telemetry leads overlie the chest. IMPRESSION: 1. Persistent right basilar airspace opacities with developing right effusion. 2. Globally increased opacity in the retrocardiac space, could reflect developing pleural effusion, atelectasis or pneumonia. 3. Marked patient rotation as well patient's head obscuring the left mid to upper lung, limiting evaluation. Electronically Signed: By: Lovena Le M.D. On: 05/21/2020 05:13   CT CHEST W CONTRAST  Result Date: 05/21/2020 CLINICAL DATA:  Respiratory failure history of multiple sclerosis and DVT on Xarelto. EXAM: CT CHEST WITH CONTRAST TECHNIQUE: Multidetector CT imaging of the chest was performed during intravenous contrast administration. CONTRAST:  68mL OMNIPAQUE IOHEXOL 300 MG/ML  SOLN COMPARISON:  06/13/2015 FINDINGS: Cardiovascular: Heart size is normal with small pericardial effusion. Central pulmonary vasculature is normal on venous phase assessment, study not protocol for PE evaluation. Aortic caliber is normal. Mediastinum/Nodes: No axillary adenopathy. Thoracic inlet structures are normal. Lungs/Pleura: Moderate bilateral pleural effusions. Basilar airspace disease bilaterally. Effusion greatest on the RIGHT. Airspace disease greatest on the LEFT with component of volume loss and shift of mediastinal structures from RIGHT to LEFT into the LEFT chest. Upper Abdomen: Incidental imaging of upper abdominal contents without acute process. Musculoskeletal: Chronic deformity of the sternum indicative of prior fracture. Evidence of spinal fusion at the upper portion of the lumbar spine,  incompletely imaged on CT data. No acute bone finding. T11 compression with similar appearance. IMPRESSION: 1. Moderate bilateral pleural effusions with bibasilar airspace disease greatest on the LEFT with component of volume loss and shift of mediastinal structures from RIGHT to LEFT into the LEFT chest. Much of this appears to represent volume loss though there is some areas of variable enhancement that raise the question of concomitant pneumonia particularly at the LEFT lung base. Loss of air bronchograms raising the question of aspiration though there is no material seen in the trachea or larger airways. 2. Small pericardial effusion. 3. Chronic deformity of the sternum indicative of prior fracture. 4. T11 compression with similar appearance. 5. Aortic atherosclerosis. Aortic Atherosclerosis (ICD10-I70.0). Electronically Signed   By: Zetta Bills M.D.   On: 05/21/2020 11:06   DG Chest Port 1 View  Result Date: 05/23/2020 CLINICAL DATA:  Per chart Patient admitted with sepsis secondary to UTI and possibly pneumonia. Hx of high blood pressure, chronic heart failure, hypotension. Non smoker. EXAM: PORTABLE CHEST 1 VIEW COMPARISON:  05/21/2020 FINDINGS: Left lung base opacity obscures the hemidiaphragm and left heart border, associated with air bronchograms. There is bilateral vascular congestion and interstitial thickening. Hazy opacity is noted at the right lung base, which appears increased from the previous exam. Cardiac silhouette is normal in size. No mediastinal or hilar masses. Small bilateral effusions, better appreciated on the CT from 05/21/2020. No pneumothorax. IMPRESSION: 1. Mild increase in hazy opacity at the right lung base compared to the most recent prior exam, which may reflect an increase in atelectasis, pneumonia or a combination. 2. No other change. 3. Persistent consolidation in the left lung base, which may reflect pneumonia or atelectasis. Bilateral vascular congestion and  interstitial prominence, also unchanged, without overt pulmonary edema. Electronically Signed   By: Lajean Manes M.D.   On: 05/23/2020 15:42   DG Chest Port 1 View  Result Date: 05/21/2020 CLINICAL DATA:  Status post right-sided thoracentesis. EXAM: PORTABLE CHEST 1 VIEW COMPARISON:  May 21, 2020 (4:34 a.m.) FINDINGS: The study is limited  secondary to limited patient positioning. The lungs are hyperinflated. Mild to moderate severity diffuse chronic appearing increased interstitial lung markings are seen. There is opacification of the lower half of the left lung. No pneumothorax is identified. The cardiac silhouette is borderline in size. Multiple chronic left-sided rib fractures are seen. Degenerative changes seen throughout the thoracic spine. IMPRESSION: 1. Opacification of the lower half of the left lung consistent with moderate-sized pleural effusion and associated atelectasis versus infiltrate. 2. Underlying chronic interstitial lung disease. Electronically Signed   By: Virgina Norfolk M.D.   On: 05/21/2020 16:58   DG Chest Portable 1 View  Result Date: 05/19/2020 CLINICAL DATA:  Sepsis.  Hypotension.  Fever. EXAM: PORTABLE CHEST 1 VIEW COMPARISON:  08/19/2017 FINDINGS: Patient's chin obscures the apices. Significant patient rotation. Stable heart size and mediastinal contours. Streaky retrocardiac opacity. Patchy right lung base opacity. Possible small pleural effusions. No pneumothorax. No pulmonary edema. Bones under mineralized with scoliotic curvature of the spine. IMPRESSION: 1. Streaky retrocardiac opacity and patchy right lung base opacity, may be atelectasis or pneumonia. Possible small pleural effusions. 2. Rotated exam. Electronically Signed   By: Keith Rake M.D.   On: 05/19/2020 01:01   ECHOCARDIOGRAM COMPLETE  Result Date: 05/28/2020    ECHOCARDIOGRAM REPORT   Patient Name:   Donye E Armor Date of Exam: 05/28/2020 Medical Rec #:  [id number]      Height:       65.0 in Accession  #:    [id number]     Weight:       115.4 lb Date of Birth:  Apr 26, 1946      BSA:          1.566 m Patient Age:    74 years       BP:           129/74 mmHg Patient Gender: F              HR:           67 bpm. Exam Location:  [facility] Procedure: 2D Echo, Cardiac Doppler and Color Doppler Indications:     CHF- acute diastolic 829.56  History:         Patient has no prior history of Echocardiogram examinations.                  CAD. DVT,lupus.  Sonographer:     Sherrie Sport RDCS (AE) Referring Phys:  [id number] Sharen Hones Diagnosing Phys: Neoma Laming MD  Sonographer Comments: Technically challenging study due to limited acoustic windows. Patient was contracted on left side- difficult to find echo views on left side. IMPRESSIONS  1. Left ventricular ejection fraction, by estimation, is 50 to 55%. The left ventricle has low normal function. The left ventricle has no regional wall motion abnormalities. Left ventricular diastolic parameters are consistent with Grade I diastolic dysfunction (impaired relaxation).  2. Right ventricular systolic function is severely reduced. The right ventricular size is severely enlarged. Mildly increased right ventricular wall thickness. There is mildly elevated pulmonary artery systolic pressure.  3. Left atrial size was mildly dilated.  4. Right atrial size was mildly dilated.  5. The mitral valve is normal in structure. No evidence of mitral valve regurgitation. No evidence of mitral stenosis.  6. The aortic valve is normal in structure. Aortic valve regurgitation is not visualized. Mild aortic valve sclerosis is present, with no evidence of aortic valve stenosis.  7. The inferior vena cava is normal in size with greater than 50%  respiratory variability, suggesting right atrial pressure of 3 mmHg. FINDINGS  Left Ventricle: Left ventricular ejection fraction, by estimation, is 50 to 55%. The left ventricle has low normal function. The left ventricle has no regional wall motion abnormalities. The  left ventricular internal cavity size was normal in size. There is no left ventricular hypertrophy. Left ventricular diastolic parameters are consistent with Grade I diastolic dysfunction (impaired relaxation). Right Ventricle: The right ventricular size is severely enlarged. Mildly increased right ventricular wall thickness. Right ventricular systolic function is severely reduced. There is mildly elevated pulmonary artery systolic pressure. The tricuspid regurgitant velocity is 2.57 m/s, and with an assumed right atrial pressure of 10 mmHg, the estimated right ventricular systolic pressure is 15.4 mmHg. Left Atrium: Left atrial size was mildly dilated. Right Atrium: Right atrial size was mildly dilated. Pericardium: There is no evidence of pericardial effusion. Mitral Valve: The mitral valve is normal in structure. Normal mobility of the mitral valve leaflets. No evidence of mitral valve regurgitation. No evidence of mitral valve stenosis. Tricuspid Valve: The tricuspid valve is normal in structure. Tricuspid valve regurgitation is trivial. No evidence of tricuspid stenosis. Aortic Valve: The aortic valve is normal in structure. Aortic valve regurgitation is not visualized. Mild aortic valve sclerosis is present, with no evidence of aortic valve stenosis. Aortic valve mean gradient measures 2.5 mmHg. Aortic valve peak gradient measures 4.7 mmHg. Aortic valve area, by VTI measures 2.48 cm. Pulmonic Valve: The pulmonic valve was normal in structure. Pulmonic valve regurgitation is trivial. No evidence of pulmonic stenosis. Aorta: The aortic root is normal in size and structure. Venous: The inferior vena cava is normal in size with greater than 50% respiratory variability, suggesting right atrial pressure of 3 mmHg. IAS/Shunts: No atrial level shunt detected by color flow Doppler.  LEFT VENTRICLE PLAX 2D LVIDd:         3.39 cm  Diastology LVIDs:         2.44 cm  LV e' lateral:   8.81 cm/s LV PW:         1.18 cm  LV  E/e' lateral: 7.7 LV IVS:        0.90 cm  LV e' medial:    4.03 cm/s LVOT diam:     2.00 cm  LV E/e' medial:  16.7 LV SV:         53 LV SV Index:   34 LVOT Area:     3.14 cm  RIGHT VENTRICLE RV Basal diam:  3.57 cm LEFT ATRIUM           Index       RIGHT ATRIUM           Index LA diam:      3.30 cm 2.11 cm/m  RA Area:     10.90 cm LA Vol (A2C): 43.7 ml 27.91 ml/m RA Volume:   24.10 ml  15.39 ml/m LA Vol (A4C): 26.4 ml 16.86 ml/m  AORTIC VALVE                   PULMONIC VALVE AV Area (Vmax):    2.14 cm    RVOT Peak grad: 1 mmHg AV Area (Vmean):   1.87 cm AV Area (VTI):     2.48 cm AV Vmax:           108.50 cm/s AV Vmean:          75.900 cm/s AV VTI:  0.213 m AV Peak Grad:      4.7 mmHg AV Mean Grad:      2.5 mmHg LVOT Vmax:         73.90 cm/s LVOT Vmean:        45.200 cm/s LVOT VTI:          0.168 m LVOT/AV VTI ratio: 0.79  AORTA Ao Root diam: 2.30 cm MITRAL VALVE               TRICUSPID VALVE MV Area (PHT): 5.13 cm    TR Peak grad:   26.4 mmHg MV Decel Time: 148 msec    TR Vmax:        257.00 cm/s MV E velocity: 67.40 cm/s MV A velocity: 63.10 cm/s  SHUNTS MV E/A ratio:  1.07        Systemic VTI:  0.17 m                            Systemic Diam: 2.00 cm Neoma Laming MD Electronically signed by Neoma Laming MD Signature Date/Time: 05/28/2020/4:38:31 PM    Final    US THORACENTESIS ASP PLEURAL SPACE W/IMG GUIDE  Result Date: 05/21/2020 CLINICAL DATA:  Respiratory failure and bilateral pleural effusions, right greater than left. EXAM: ULTRASOUND GUIDED RIGHT THORACENTESIS COMPARISON:  None. PROCEDURE: An ultrasound guided thoracentesis was thoroughly discussed with the patient and questions answered. The benefits, risks, alternatives and complications were also discussed. The patient understands and wishes to proceed with the procedure. Written consent was obtained. Ultrasound was performed to localize and mark an adequate pocket of fluid in the right chest. The area was then prepped and draped in  the normal sterile fashion. 1% Lidocaine was used for local anesthesia. Under ultrasound guidance a 6 French Safe-T-Centesis catheter was introduced. Thoracentesis was performed. The catheter was removed and a dressing applied. COMPLICATIONS: None FINDINGS: A total of approximately 550 mL of clear, yellow fluid was removed. A fluid sample was sent for laboratory analysis. IMPRESSION: Successful ultrasound guided right thoracentesis yielding 550 mL of pleural fluid. Electronically Signed   By: Aletta Edouard M.D.   On: 05/21/2020 17:00                ASSESSMENT/PLAN   Acute hypoxemic respiratory failure likley due to bilateral compressive atelectasis  -present on admission   - patient with febrile illness and left lung infiltrate possible CAP  -procalcitonin with only mild elevation -will trend - thoracentesis performed and patient improved with now only 1-2L/min [place] - continue rocephin/zithromax - for total 7 days. Patient will be here 8 days tommorow   Bilateral pleural effusions - Reviewed fluid profile- lymphocyte predominant transudate suggestive of CHF vs MS related fluid -will attempt diuresis gently post improved BP -Thoracentesis studies are still peding -repeat CXR with itnerval improvement -will try diuresing again today - once time dose 20 lasix ordered - BP is stable    BiIateral compressive atelectasis -will drain fluid and diurese -recruitment manuevers with Metaneb therapy utilising saline  -albuterol nebulizer -patient unable to use IS/Flutter due to MS hx   Moderate protein calorie malnutrition   - hindering diuresis and may be cause of effusion   - will replete Albumin and consult RD nutritional consult    Circulatory shock-improved   - hindering ability to diurese- continue midodrine -present on admission - likely related to sepsis secondary to UTI vs CAP - resolved with now transient hypotension  -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   Patient/Family are satisfied with care plan and all questions have been answered.   This document was prepared using Dragon voice recognition software and may include unintentional dictation errors.     Ottie Glazier, M.D.  Division of [place]

## 2020-05-29 NOTE — Progress Notes (Signed)
[facility] Room [facility] Liaison RN note  Received request from [place] and PMT NP Asencion Gowda for family interest in [facility]. Chart reviewed. Met with patient and daughter Caren Griffins to acknowledge referral.  Unfortunately, HH is not able to offer a room today. Cynthia and TOC aware [place] liaison will reach out over weekend if room becomes available.  Admission RN at [facility] can be reached at [phone].  Please call with any questions or concerns.  Thank you. Margaretmary Eddy, BSN, RN [facility] - Inpatient Liaison [phone]

## 2020-05-29 NOTE — TOC Progression Note (Signed)
Transition of Care [facility]) - Progression Note    Patient Details  Name: Cynthia Price MRN: [id number] Date of Birth: 1946/09/29  Transition of Care [facility]) CM/SW Contact  Eileen Stanford, LCSW Phone Number: 05/29/2020, 2:07 PM  Clinical Narrative:   CSW spoke with pt's daughter via telephone. They wants to proceed with [facility]. Santiago Glad with [facility] aware, however no bed today.    Expected Discharge Plan: [place] Barriers to Discharge: Continued Medical Work up  Expected Discharge Plan and Services Expected Discharge Plan: [place] In-house Referral: Clinical Social Work   Post Acute Care Choice: [place] Living arrangements for the past 2 months: [place]                                       Social Determinants of Health (SDOH) Interventions    Readmission Risk Interventions No flowsheet data found.

## 2020-05-29 NOTE — Progress Notes (Signed)
PROGRESS NOTE    Cynthia Price  KYH:[id number] DOB: [birth year] DOA: 05/18/2020 PCP: [name], MD   Chief complaint.  Shortness of breath. Brief Narrative:  Cynthia Price a74 y.o.femalewith a known history of multiple sclerosis,DVT on Xarelto, depression, coronary artery disease,IBS, chronic anemia andchronic diastolic CHF who comes from her skilled nursing facility with acute onset of fever for the last couple of days for which she was given Tylenoland was noted to have hypotensionthat prompted 911 call. Upon arrival EMSherblood pressure was60/44 for which she wasgiven IV fluid bolusand blood pressure was up to the 80s in the ER.No history could be obtained from the patient due to her altered mental status and likely underlying dementia.  Interim history Patient admitted with sepsis secondary to UTI and possibly pneumonia, currently placed on IV antibiotics. Also noted to have anemia, Xarelto held as FOBT was positive, GI consulted. Overnight, patient became febrile with tachycardia and hypoxia, repeat chest x-ray was obtained. Pulmonary consulted. S/p thoracentesis.  6/30.Patient blood pressure still low, infection seem to be better. Urine culture grew multiple species. Blood cultures negative. Palliative care consult obtained.  She also had an episode of PSVT with heart rate over 200.  Received adenosine, amiodarone bolus as well as amnio drip.    7/1.  Patient has converted to sinus rhythm today.  Still on 2 L oxygen.  Condition more stable.  Will likely transfer to nursing home tomorrow.  Antibiotics completed.  Start IV Lasix for exacerbation of diastolic congestive heart failure.  7/2.  Patient is seen by palliative care, had a long discussion with the daughter and the patient, decision was made to transition to comfort care.  Pending hospice transfer.   Assessment & Plan:   Active Problems:   Hypotension   Acute on chronic diastolic CHF  (congestive heart failure) (HCC)   Sepsis (HCC)   Pressure injury of skin   Protein-calorie malnutrition, severe   Acute hypoxemic respiratory failure ([place])  Patient condition is terminal.  She still has significant short of breath due to congestive heart failure.  We will continue IV Lasix for comfort care.  Will discontinue all life preserving treatment.  Continue comfort care.     Subjective: Patient still has short of breath, worse with exertion.  Poor p.o. intake.  Still has some dysphagia.  No nausea vomiting.  Objective: Vitals:   05/29/20 0117 05/29/20 0534 05/29/20 0733 05/29/20 1139  BP: (!) 177/78 129/87 136/79 106/68  Pulse: 89 74 76 76  Resp: 20 19 16    Temp: 98.5 F (36.9 C) 98.1 F (36.7 C) 98.1 F (36.7 C) 98.5 F (36.9 C)  TempSrc:  Oral  Oral  SpO2: 100% 96% 94% 91%  Weight:      Height:        Intake/Output Summary (Last 24 hours) at 05/29/2020 1508 Last data filed at 05/29/2020 1030 Gross per 24 hour  Intake --  Output 2200 ml  Net -2200 ml   Filed Weights   05/26/20 0500 05/27/20 0456 05/28/20 0500  Weight: 52.9 kg 52.3 kg 52.3 kg    Examination:  General exam: Appears calm and comfortable appear chronically ill. Respiratory system: He is breathing sounds with some crackles in the base. Respiratory effort normal. Cardiovascular system: S1 & S2 heard, RRR. No JVD, murmurs, rubs, gallops or clicks. No pedal edema. Gastrointestinal system: Abdomen is nondistended, soft and nontender. No organomegaly or masses felt. Normal bowel sounds heard. Central nervous system: Alert and oriented x2. No focal neurological  deficits. Extremities: Symmetric  Skin: No rashes, lesions or ulcers Psychiatry: Judgement and insight appear normal. Mood & affect appropriate.     Data Reviewed: I have personally reviewed following labs and imaging studies  CBC: Recent Labs  Lab 05/24/20 0534 05/24/20 0534 05/25/20 0558 05/26/20 0455 05/27/20 0553 05/28/20 0417  05/29/20 0557  WBC 12.3*  --  8.7  --  9.8 14.1* 12.6*  NEUTROABS  --   --   --   --   --  12.5* 10.7*  HGB 10.1*   < > 9.3* 9.0* 9.0* 8.8* 9.7*  HCT 31.9*   < > 29.1* 27.9* 27.5* 27.8* 29.5*  MCV 97.0  --  99.0  --  96.2 100.0 97.0  PLT 378  --  391  --  326 296 357   < > = values in this interval not displayed.   Basic Metabolic Panel: Recent Labs  Lab 05/24/20 0534 05/25/20 0558 05/27/20 0553 05/28/20 0417 05/29/20 0557  NA 142 141 141 144 145  K 3.6 4.2 3.2* 3.6 3.4*  CL 105 105 104 107 100  CO2 29 27 30 29  33*  GLUCOSE 87 166* 111* 124* 106*  BUN 20 25* 30* 32* 27*  CREATININE 0.66 0.69 0.62 0.56 0.61  CALCIUM 8.5* 8.7* 8.7* 8.6* 9.0  MG  --   --   --  2.3 2.2   GFR: Estimated Creatinine Clearance: 50.9 mL/min (by C-G formula based on SCr of 0.61 mg/dL). Liver Function Tests: No results for input(s): AST, ALT, ALKPHOS, BILITOT, PROT, ALBUMIN in the last 168 hours. No results for input(s): LIPASE, AMYLASE in the last 168 hours. No results for input(s): AMMONIA in the last 168 hours. Coagulation Profile: No results for input(s): INR, PROTIME in the last 168 hours. Cardiac Enzymes: No results for input(s): CKTOTAL, CKMB, CKMBINDEX, TROPONINI in the last 168 hours. BNP (last 3 results) No results for input(s): PROBNP in the last 8760 hours. HbA1C: No results for input(s): HGBA1C in the last 72 hours. CBG: Recent Labs  Lab 05/24/20 0614  GLUCAP 85   Lipid Profile: No results for input(s): CHOL, HDL, LDLCALC, TRIG, CHOLHDL, LDLDIRECT in the last 72 hours. Thyroid Function Tests: Recent Labs    05/28/20 0417  TSH 1.151   Anemia Panel: No results for input(s): VITAMINB12, FOLATE, FERRITIN, TIBC, IRON, RETICCTPCT in the last 72 hours. Sepsis Labs: Recent Labs  Lab 05/23/20 0316  PROCALCITON 0.74    Recent Results (from the past 240 hour(s))  MRSA PCR Screening     Status: None   Collection Time: 05/19/20  6:31 PM   Specimen: Nasopharyngeal  Result  Value Ref Range Status   MRSA by PCR NEGATIVE NEGATIVE Final    Comment:        The GeneXpert MRSA Assay (FDA approved for NASAL specimens only), is one component of a comprehensive MRSA colonization surveillance program. It is not intended to diagnose MRSA infection nor to guide or monitor treatment for MRSA infections. Performed at [facility], [address] [place], [place] [address]   Body fluid culture     Status: None   Collection Time: 05/21/20  4:38 PM   Specimen: PATH Cytology Pleural fluid  Result Value Ref Range Status   Specimen Description   Final    PLEURAL Performed at [facility], [address], [place], [place] [address]    Special Requests NONE  Final   Gram Stain   Final    FEW WBC  PRESENT, PREDOMINANTLY MONONUCLEAR NO ORGANISMS SEEN    Culture   Final    NO GROWTH Performed at [facility], [place] [address] [place], [place] [address]    Report Status 05/25/2020 FINAL  Final  Acid Fast Smear (AFB)     Status: None   Collection Time: 05/21/20  4:38 PM   Specimen: PATH Cytology Pleural fluid  Result Value Ref Range Status   AFB Specimen Processing Concentration  Final   Acid Fast Smear Negative  Final    Comment: (NOTE) Performed At: [facility] [address] [place], [place] [id number] Rush Farmer MD OM:[id number]    Source (AFB) PLEURAL  Final    Comment: Performed at [facility], [address] [place], [place] [address]         Radiology Studies: ECHOCARDIOGRAM COMPLETE  Result Date: 05/28/2020    ECHOCARDIOGRAM REPORT   Patient Name:   Odelle E Coplen Date of Exam: 05/28/2020 Medical Rec #:  [id number]      Height:       65.0 in Accession #:    [id number]     Weight:       115.4 lb Date of Birth:  Dec 17, 1945      BSA:          1.566 m Patient Age:    89 years       BP:           129/74 mmHg Patient Gender: F              HR:           67 bpm. Exam Location:  [facility] Procedure: 2D Echo, Cardiac  Doppler and Color Doppler Indications:     CHF- acute diastolic 889.16  History:         Patient has no prior history of Echocardiogram examinations.                  CAD. DVT,lupus.  Sonographer:     Sherrie Sport RDCS (AE) Referring Phys:  [id number] Sharen Hones Diagnosing Phys: Neoma Laming MD  Sonographer Comments: Technically challenging study due to limited acoustic windows. Patient was contracted on left side- difficult to find echo views on left side. IMPRESSIONS  1. Left ventricular ejection fraction, by estimation, is 50 to 55%. The left ventricle has low normal function. The left ventricle has no regional wall motion abnormalities. Left ventricular diastolic parameters are consistent with Grade I diastolic dysfunction (impaired relaxation).  2. Right ventricular systolic function is severely reduced. The right ventricular size is severely enlarged. Mildly increased right ventricular wall thickness. There is mildly elevated pulmonary artery systolic pressure.  3. Left atrial size was mildly dilated.  4. Right atrial size was mildly dilated.  5. The mitral valve is normal in structure. No evidence of mitral valve regurgitation. No evidence of mitral stenosis.  6. The aortic valve is normal in structure. Aortic valve regurgitation is not visualized. Mild aortic valve sclerosis is present, with no evidence of aortic valve stenosis.  7. The inferior vena cava is normal in size with greater than 50% respiratory variability, suggesting right atrial pressure of 3 mmHg. FINDINGS  Left Ventricle: Left ventricular ejection fraction, by estimation, is 50 to 55%. The left ventricle has low normal function. The left ventricle has no regional wall motion abnormalities. The left ventricular internal cavity size was normal in size. There is no left ventricular hypertrophy. Left ventricular diastolic parameters are consistent with Grade I  diastolic dysfunction (impaired relaxation). Right Ventricle: The right ventricular size is  severely enlarged. Mildly increased right ventricular wall thickness. Right ventricular systolic function is severely reduced. There is mildly elevated pulmonary artery systolic pressure. The tricuspid regurgitant velocity is 2.57 m/s, and with an assumed right atrial pressure of 10 mmHg, the estimated right ventricular systolic pressure is 16.1 mmHg. Left Atrium: Left atrial size was mildly dilated. Right Atrium: Right atrial size was mildly dilated. Pericardium: There is no evidence of pericardial effusion. Mitral Valve: The mitral valve is normal in structure. Normal mobility of the mitral valve leaflets. No evidence of mitral valve regurgitation. No evidence of mitral valve stenosis. Tricuspid Valve: The tricuspid valve is normal in structure. Tricuspid valve regurgitation is trivial. No evidence of tricuspid stenosis. Aortic Valve: The aortic valve is normal in structure. Aortic valve regurgitation is not visualized. Mild aortic valve sclerosis is present, with no evidence of aortic valve stenosis. Aortic valve mean gradient measures 2.5 mmHg. Aortic valve peak gradient measures 4.7 mmHg. Aortic valve area, by VTI measures 2.48 cm. Pulmonic Valve: The pulmonic valve was normal in structure. Pulmonic valve regurgitation is trivial. No evidence of pulmonic stenosis. Aorta: The aortic root is normal in size and structure. Venous: The inferior vena cava is normal in size with greater than 50% respiratory variability, suggesting right atrial pressure of 3 mmHg. IAS/Shunts: No atrial level shunt detected by color flow Doppler.  LEFT VENTRICLE PLAX 2D LVIDd:         3.39 cm  Diastology LVIDs:         2.44 cm  LV e' lateral:   8.81 cm/s LV PW:         1.18 cm  LV E/e' lateral: 7.7 LV IVS:        0.90 cm  LV e' medial:    4.03 cm/s LVOT diam:     2.00 cm  LV E/e' medial:  16.7 LV SV:         53 LV SV Index:   34 LVOT Area:     3.14 cm  RIGHT VENTRICLE RV Basal diam:  3.57 cm LEFT ATRIUM           Index       RIGHT  ATRIUM           Index LA diam:      3.30 cm 2.11 cm/m  RA Area:     10.90 cm LA Vol (A2C): 43.7 ml 27.91 ml/m RA Volume:   24.10 ml  15.39 ml/m LA Vol (A4C): 26.4 ml 16.86 ml/m  AORTIC VALVE                   PULMONIC VALVE AV Area (Vmax):    2.14 cm    RVOT Peak grad: 1 mmHg AV Area (Vmean):   1.87 cm AV Area (VTI):     2.48 cm AV Vmax:           108.50 cm/s AV Vmean:          75.900 cm/s AV VTI:            0.213 m AV Peak Grad:      4.7 mmHg AV Mean Grad:      2.5 mmHg LVOT Vmax:         73.90 cm/s LVOT Vmean:        45.200 cm/s LVOT VTI:          0.168 m LVOT/AV VTI ratio: 0.79  AORTA  Ao Root diam: 2.30 cm MITRAL VALVE               TRICUSPID VALVE MV Area (PHT): 5.13 cm    TR Peak grad:   26.4 mmHg MV Decel Time: 148 msec    TR Vmax:        257.00 cm/s MV E velocity: 67.40 cm/s MV A velocity: 63.10 cm/s  SHUNTS MV E/A ratio:  1.07        Systemic VTI:  0.17 m                            Systemic Diam: 2.00 cm Neoma Laming MD Electronically signed by Neoma Laming MD Signature Date/Time: 05/28/2020/4:38:31 PM    Final         Scheduled Meds: . amiodarone  200 mg Oral Daily  . baclofen  20 mg Oral TID  . diclofenac sodium  4 g Topical QID  . furosemide  40 mg Intravenous Q12H  . gabapentin  600 mg Oral QID  . guaiFENesin  600 mg Oral BID  . oxyCODONE  5 mg Oral Q6H  . sodium chloride flush  3 mL Intravenous Q12H  . sodium chloride flush  3 mL Intravenous Q12H  . traZODone  75 mg Oral QHS   Continuous Infusions: . sodium chloride       LOS: 10 days    Time spent: 35 minutes    Sharen Hones, MD [facility]   To contact the attending provider between 7A-7P or the covering provider during after hours 7P-7A, please log into the web site [email or web address] and access using universal [place] password for that web site. If you do not have the password, please call the hospital operator.  05/29/2020, 3:08 PM

## 2020-05-30 MED ORDER — FUROSEMIDE 40 MG PO TABS
40.0000 mg | ORAL_TABLET | Freq: Two times a day (BID) | ORAL | Status: DC
  Administered 2020-05-30 – 2020-06-03 (×8): 40 mg via ORAL
  Filled 2020-05-30 (×9): qty 1

## 2020-05-30 NOTE — Progress Notes (Signed)
PROGRESS NOTE    LEILYNN Price  XHB:[id number] DOB: 04/21/1946 DOA: 05/18/2020 PCP: [name], MD   Chief complaint.  Shortness of breath.  Brief Narrative:  Patient admitted with sepsis secondary to UTI and possibly pneumonia, currently placed on IV antibiotics. Also noted to have anemia, Xarelto held as FOBT was positive, GI consulted. Overnight, patient became febrile with tachycardia and hypoxia, repeat chest x-ray was obtained. Pulmonary consulted. S/p thoracentesis.  6/30.Patient blood pressure still low, infection seem to be better. Urine culture grew multiple species. Blood cultures negative. Palliative care consult obtained.She also had an episode of PSVT with heart rate over 200. Received adenosine,amiodarone bolus as well as amnio drip.   7/1. Patient has converted to sinus rhythm today. Still on 2 L oxygen. Condition more stable. Will likely transfer to nursing home tomorrow. Antibiotics completed.  Start IV Lasix for exacerbation of diastolic congestive heart failure.  7/2.  Patient is seen by palliative care, had a long discussion with the daughter and the patient, decision was made to transition to comfort care.  Pending hospice transfer.   Assessment & Plan:   Active Problems:   Multiple sclerosis (HCC)   Hypotension   Acute on chronic diastolic CHF (congestive heart failure) (HCC)   Sepsis (HCC)   Pressure injury of skin   Protein-calorie malnutrition, severe   Acute hypoxemic respiratory failure ([place])  Patient currently is comfortable.  Pending inpatient hospice transfer.  We will continue comfort care.  Patient has been diuresed significantly, will change Lasix to oral.     Subjective: Patient still complaining of short of breath.  Pain in the legs.  Dysphagia.  But able to eat yesterday.  No cough. No fever or chills.  Objective: Vitals:   05/29/20 1139 05/29/20 1918 05/29/20 2249 05/30/20 0720  BP: 106/68 129/81 (!) 148/86  (!) 156/83  Pulse: 76 90 78 74  Resp:  19  16  Temp: 98.5 F (36.9 C) 99.6 F (37.6 C)  98.9 F (37.2 C)  TempSrc: Oral   Oral  SpO2: 91% 96%  93%  Weight:      Height:        Intake/Output Summary (Last 24 hours) at 05/30/2020 0914 Last data filed at 05/30/2020 0532 Gross per 24 hour  Intake --  Output 2350 ml  Net -2350 ml   Filed Weights   05/26/20 0500 05/27/20 0456 05/28/20 0500  Weight: 52.9 kg 52.3 kg 52.3 kg    Examination:  General exam: Appears calm and comfortable, chronically ill Respiratory system: Clear to auscultation. Respiratory effort normal. Cardiovascular system: Regular. No JVD, murmurs, rubs, gallops or clicks. No pedal edema. Gastrointestinal system: Abdomen is nondistended, soft and nontender. No organomegaly or masses felt. Normal bowel sounds heard. Central nervous system: Alert and oriented x2. No focal neurological deficits. Extremities: Symmetric Skin: No rashes, lesions or ulcers Psychiatry: Mood & affect appropriate.     Data Reviewed: I have personally reviewed following labs and imaging studies  CBC: Recent Labs  Lab 05/24/20 0534 05/24/20 0534 05/25/20 0558 05/26/20 0455 05/27/20 0553 05/28/20 0417 05/29/20 0557  WBC 12.3*  --  8.7  --  9.8 14.1* 12.6*  NEUTROABS  --   --   --   --   --  12.5* 10.7*  HGB 10.1*   < > 9.3* 9.0* 9.0* 8.8* 9.7*  HCT 31.9*   < > 29.1* 27.9* 27.5* 27.8* 29.5*  MCV 97.0  --  99.0  --  96.2 100.0 97.0  PLT 378  --  391  --  326 296 357   < > = values in this interval not displayed.   Basic Metabolic Panel: Recent Labs  Lab 05/24/20 0534 05/25/20 0558 05/27/20 0553 05/28/20 0417 05/29/20 0557  NA 142 141 141 144 145  K 3.6 4.2 3.2* 3.6 3.4*  CL 105 105 104 107 100  CO2 29 27 30 29  33*  GLUCOSE 87 166* 111* 124* 106*  BUN 20 25* 30* 32* 27*  CREATININE 0.66 0.69 0.62 0.56 0.61  CALCIUM 8.5* 8.7* 8.7* 8.6* 9.0  MG  --   --   --  2.3 2.2   GFR: Estimated Creatinine Clearance: 50.9 mL/min  (by C-G formula based on SCr of 0.61 mg/dL). Liver Function Tests: No results for input(s): AST, ALT, ALKPHOS, BILITOT, PROT, ALBUMIN in the last 168 hours. No results for input(s): LIPASE, AMYLASE in the last 168 hours. No results for input(s): AMMONIA in the last 168 hours. Coagulation Profile: No results for input(s): INR, PROTIME in the last 168 hours. Cardiac Enzymes: No results for input(s): CKTOTAL, CKMB, CKMBINDEX, TROPONINI in the last 168 hours. BNP (last 3 results) No results for input(s): PROBNP in the last 8760 hours. HbA1C: No results for input(s): HGBA1C in the last 72 hours. CBG: Recent Labs  Lab 05/24/20 0614  GLUCAP 85   Lipid Profile: No results for input(s): CHOL, HDL, LDLCALC, TRIG, CHOLHDL, LDLDIRECT in the last 72 hours. Thyroid Function Tests: Recent Labs    05/28/20 0417  TSH 1.151   Anemia Panel: No results for input(s): VITAMINB12, FOLATE, FERRITIN, TIBC, IRON, RETICCTPCT in the last 72 hours. Sepsis Labs: No results for input(s): PROCALCITON, LATICACIDVEN in the last 168 hours.  Recent Results (from the past 240 hour(s))  Body fluid culture     Status: None   Collection Time: 05/21/20  4:38 PM   Specimen: PATH Cytology Pleural fluid  Result Value Ref Range Status   Specimen Description   Final    PLEURAL Performed at [facility], [address], [place], [place] [address]    Special Requests NONE  Final   Gram Stain   Final    FEW WBC PRESENT, PREDOMINANTLY MONONUCLEAR NO ORGANISMS SEEN    Culture   Final    NO GROWTH Performed at [facility], [place] [address] [place], [place] [address]    Report Status 05/25/2020 FINAL  Final  Acid Fast Smear (AFB)     Status: None   Collection Time: 05/21/20  4:38 PM   Specimen: PATH Cytology Pleural fluid  Result Value Ref Range Status   AFB Specimen Processing Concentration  Final   Acid Fast Smear Negative  Final    Comment: (NOTE) Performed At: [facility] [address] [place], [place] [id number] Rush Farmer MD YI:[id number]    Source (AFB) PLEURAL  Final    Comment: Performed at [facility], [address] [place], [place] [address]         Radiology Studies: ECHOCARDIOGRAM COMPLETE  Result Date: 05/28/2020    ECHOCARDIOGRAM REPORT   Patient Name:   AINHOA RALLO Yarbro Date of Exam: 05/28/2020 Medical Rec #:  [id number]      Height:       65.0 in Accession #:    [id number]     Weight:       115.4 lb Date of Birth:  1945/12/06      BSA:  1.566 m Patient Age:    36 years       BP:           129/74 mmHg Patient Gender: F              HR:           67 bpm. Exam Location:  [facility] Procedure: 2D Echo, Cardiac Doppler and Color Doppler Indications:     CHF- acute diastolic 299.24  History:         Patient has no prior history of Echocardiogram examinations.                  CAD. DVT,lupus.  Sonographer:     Sherrie Sport RDCS (AE) Referring Phys:  [id number] Sharen Hones Diagnosing Phys: Neoma Laming MD  Sonographer Comments: Technically challenging study due to limited acoustic windows. Patient was contracted on left side- difficult to find echo views on left side. IMPRESSIONS  1. Left ventricular ejection fraction, by estimation, is 50 to 55%. The left ventricle has low normal function. The left ventricle has no regional wall motion abnormalities. Left ventricular diastolic parameters are consistent with Grade I diastolic dysfunction (impaired relaxation).  2. Right ventricular systolic function is severely reduced. The right ventricular size is severely enlarged. Mildly increased right ventricular wall thickness. There is mildly elevated pulmonary artery systolic pressure.  3. Left atrial size was mildly dilated.  4. Right atrial size was mildly dilated.  5. The mitral valve is normal in structure. No evidence of mitral valve regurgitation. No evidence of mitral stenosis.  6. The aortic valve is normal in structure. Aortic valve regurgitation is not  visualized. Mild aortic valve sclerosis is present, with no evidence of aortic valve stenosis.  7. The inferior vena cava is normal in size with greater than 50% respiratory variability, suggesting right atrial pressure of 3 mmHg. FINDINGS  Left Ventricle: Left ventricular ejection fraction, by estimation, is 50 to 55%. The left ventricle has low normal function. The left ventricle has no regional wall motion abnormalities. The left ventricular internal cavity size was normal in size. There is no left ventricular hypertrophy. Left ventricular diastolic parameters are consistent with Grade I diastolic dysfunction (impaired relaxation). Right Ventricle: The right ventricular size is severely enlarged. Mildly increased right ventricular wall thickness. Right ventricular systolic function is severely reduced. There is mildly elevated pulmonary artery systolic pressure. The tricuspid regurgitant velocity is 2.57 m/s, and with an assumed right atrial pressure of 10 mmHg, the estimated right ventricular systolic pressure is 62.2 mmHg. Left Atrium: Left atrial size was mildly dilated. Right Atrium: Right atrial size was mildly dilated. Pericardium: There is no evidence of pericardial effusion. Mitral Valve: The mitral valve is normal in structure. Normal mobility of the mitral valve leaflets. No evidence of mitral valve regurgitation. No evidence of mitral valve stenosis. Tricuspid Valve: The tricuspid valve is normal in structure. Tricuspid valve regurgitation is trivial. No evidence of tricuspid stenosis. Aortic Valve: The aortic valve is normal in structure. Aortic valve regurgitation is not visualized. Mild aortic valve sclerosis is present, with no evidence of aortic valve stenosis. Aortic valve mean gradient measures 2.5 mmHg. Aortic valve peak gradient measures 4.7 mmHg. Aortic valve area, by VTI measures 2.48 cm. Pulmonic Valve: The pulmonic valve was normal in structure. Pulmonic valve regurgitation is trivial. No  evidence of pulmonic stenosis. Aorta: The aortic root is normal in size and structure. Venous: The inferior vena cava is normal in size with greater than 50%  respiratory variability, suggesting right atrial pressure of 3 mmHg. IAS/Shunts: No atrial level shunt detected by color flow Doppler.  LEFT VENTRICLE PLAX 2D LVIDd:         3.39 cm  Diastology LVIDs:         2.44 cm  LV e' lateral:   8.81 cm/s LV PW:         1.18 cm  LV E/e' lateral: 7.7 LV IVS:        0.90 cm  LV e' medial:    4.03 cm/s LVOT diam:     2.00 cm  LV E/e' medial:  16.7 LV SV:         53 LV SV Index:   34 LVOT Area:     3.14 cm  RIGHT VENTRICLE RV Basal diam:  3.57 cm LEFT ATRIUM           Index       RIGHT ATRIUM           Index LA diam:      3.30 cm 2.11 cm/m  RA Area:     10.90 cm LA Vol (A2C): 43.7 ml 27.91 ml/m RA Volume:   24.10 ml  15.39 ml/m LA Vol (A4C): 26.4 ml 16.86 ml/m  AORTIC VALVE                   PULMONIC VALVE AV Area (Vmax):    2.14 cm    RVOT Peak grad: 1 mmHg AV Area (Vmean):   1.87 cm AV Area (VTI):     2.48 cm AV Vmax:           108.50 cm/s AV Vmean:          75.900 cm/s AV VTI:            0.213 m AV Peak Grad:      4.7 mmHg AV Mean Grad:      2.5 mmHg LVOT Vmax:         73.90 cm/s LVOT Vmean:        45.200 cm/s LVOT VTI:          0.168 m LVOT/AV VTI ratio: 0.79  AORTA Ao Root diam: 2.30 cm MITRAL VALVE               TRICUSPID VALVE MV Area (PHT): 5.13 cm    TR Peak grad:   26.4 mmHg MV Decel Time: 148 msec    TR Vmax:        257.00 cm/s MV E velocity: 67.40 cm/s MV A velocity: 63.10 cm/s  SHUNTS MV E/A ratio:  1.07        Systemic VTI:  0.17 m                            Systemic Diam: 2.00 cm Neoma Laming MD Electronically signed by Neoma Laming MD Signature Date/Time: 05/28/2020/4:38:31 PM    Final         Scheduled Meds: . amiodarone  200 mg Oral Daily  . baclofen  20 mg Oral TID  . diclofenac sodium  4 g Topical QID  . furosemide  40 mg Oral BID  . gabapentin  600 mg Oral QID  . oxyCODONE  5 mg Oral  Q6H  . sodium chloride flush  3 mL Intravenous Q12H  . sodium chloride flush  3 mL Intravenous Q12H  . traZODone  75 mg Oral QHS   Continuous Infusions: .  sodium chloride       LOS: 11 days    Time spent: 25 minutes    Sharen Hones, MD [facility]   To contact the attending provider between 7A-7P or the covering provider during after hours 7P-7A, please log into the web site [email or web address] and access using universal [place] password for that web site. If you do not have the password, please call the hospital operator.  05/30/2020, 9:14 AM

## 2020-05-31 DIAGNOSIS — E861 Hypovolemia: Secondary | ICD-10-CM

## 2020-05-31 NOTE — Progress Notes (Signed)
Pt is very sleepy today, which is a change from yesterday. She states that she does not want to take her scheduled pain medicine and refused to take her gabapentin this evening. Pt states "I dont want to take anything that would make me sleep. I don't want to sleep all the time." This RN stated the medicine she is currently taking is for comfort measures. Pt stated she is comfortable and will let me know if she needs anything.

## 2020-05-31 NOTE — Progress Notes (Signed)
[place] visited pt. as follow-up from visits earlier this week.  Pt. lying down in bed w/friend Izora Gala at bedside; Izora Gala said pt.'s dtr. [name] was here earlier today.  Pt.'s windowsill decorated with cards from friends and family and a sunflower plant.  Pt. grateful for [facility] visit; requested prayer for health and strength.  Plan is still to go to hospice whenever a bed is available, pt. said.  No further needs at this time.      05/31/20 1200  Clinical Encounter Type  Visited With Patient and family together  Visit Type Follow-up;Psychological support;Spiritual support;Social support  Consult/Referral To Chaplain  Spiritual Encounters  Spiritual Needs Emotional;Grief support;Ritual  Stress Factors  Patient Stress Factors Major life changes;Health changes  Family Stress Factors Major life changes

## 2020-05-31 NOTE — Progress Notes (Signed)
PROGRESS NOTE    Cynthia Price  HQI:[id number] DOB: 08-11-1946 DOA: 05/18/2020 PCP: [name], MD   Chief complaint. Shortness of breath. Brief Narrative:  Patient admitted with sepsis secondary to UTI and possibly pneumonia, currently placed on IV antibiotics. Also noted to have anemia, Xarelto held as FOBT was positive, GI consulted. Overnight, patient became febrile with tachycardia and hypoxia, repeat chest x-ray was obtained. Pulmonary consulted. S/p thoracentesis.  6/30.Patient blood pressure still low, infection seem to be better. Urine culture grew multiple species. Blood cultures negative. Palliative care consult obtained.She also had an episode of PSVT with heart rate over 200. Received adenosine,amiodarone bolus as well as amnio drip.   7/1. Patient has converted to sinus rhythm today. Still on 2 L oxygen. Condition more stable. Will likely transfer to nursing home tomorrow. Antibiotics completed.Start IV Lasix for exacerbation of diastolic congestive heart failure.  7/2.Patient is seen by palliative care, had a long discussion with the daughter and the patient, decision was made to transition to comfort care. Pending hospice transfer.   Assessment & Plan:   Active Problems:   Multiple sclerosis (HCC)   Hypotension   Acute on chronic diastolic CHF (congestive heart failure) (HCC)   Sepsis (HCC)   Pressure injury of skin   Protein-calorie malnutrition, severe   Acute hypoxemic respiratory failure ([place])  Patient is still comfortable today.  Blood pressure has been stable.  Still significant short of breath oxygen.  Still complaining of a pain in the legs, receiving pain medicine.  I will continue comfort care measures.  Will transfer to inpatient hospice when room is available.   Subjective: Patient continues to complain about shortness of breath, still on oxygen.  No cough. Poor appetite, some dysphagia. Still pain in the legs, taking  pain medicine as needed pain No fever or chills.  Objective: Vitals:   05/30/20 0720 05/30/20 1945 05/30/20 1945 05/31/20 0712  BP: (!) 156/83  108/68 106/70  Pulse: 74  94 84  Resp: 16   17  Temp: 98.9 F (37.2 C)  99.1 F (37.3 C) 97.6 F (36.4 C)  TempSrc: Oral     SpO2: 93% 93% 95% 98%  Weight:      Height:        Intake/Output Summary (Last 24 hours) at 05/31/2020 0822 Last data filed at 05/31/2020 0658 Gross per 24 hour  Intake 120 ml  Output 1400 ml  Net -1280 ml   Filed Weights   05/26/20 0500 05/27/20 0456 05/28/20 0500  Weight: 52.9 kg 52.3 kg 52.3 kg    Examination:  General exam: Appears calm and comfortable,  Respiratory system: Clear to auscultation. Respiratory effort normal. Cardiovascular system: Irregular. No JVD, murmurs, rubs, gallops or clicks. No pedal edema. Gastrointestinal system: Abdomen is nondistended, soft and nontender. No organomegaly or masses felt. Normal bowel sounds heard. Central nervous system: Alert and oriented x2. No focal neurological deficits. Extremities: Symmetric  Skin: No rashes, lesions or ulcers Psychiatry:  Mood & affect appropriate.     Data Reviewed: I have personally reviewed following labs and imaging studies  CBC: Recent Labs  Lab 05/25/20 0558 05/26/20 0455 05/27/20 0553 05/28/20 0417 05/29/20 0557  WBC 8.7  --  9.8 14.1* 12.6*  NEUTROABS  --   --   --  12.5* 10.7*  HGB 9.3* 9.0* 9.0* 8.8* 9.7*  HCT 29.1* 27.9* 27.5* 27.8* 29.5*  MCV 99.0  --  96.2 100.0 97.0  PLT 391  --  326 296 357  Basic Metabolic Panel: Recent Labs  Lab 05/25/20 0558 05/27/20 0553 05/28/20 0417 05/29/20 0557  NA 141 141 144 145  K 4.2 3.2* 3.6 3.4*  CL 105 104 107 100  CO2 27 30 29  33*  GLUCOSE 166* 111* 124* 106*  BUN 25* 30* 32* 27*  CREATININE 0.69 0.62 0.56 0.61  CALCIUM 8.7* 8.7* 8.6* 9.0  MG  --   --  2.3 2.2   GFR: Estimated Creatinine Clearance: 50.9 mL/min (by C-G formula based on SCr of 0.61 mg/dL). Liver  Function Tests: No results for input(s): AST, ALT, ALKPHOS, BILITOT, PROT, ALBUMIN in the last 168 hours. No results for input(s): LIPASE, AMYLASE in the last 168 hours. No results for input(s): AMMONIA in the last 168 hours. Coagulation Profile: No results for input(s): INR, PROTIME in the last 168 hours. Cardiac Enzymes: No results for input(s): CKTOTAL, CKMB, CKMBINDEX, TROPONINI in the last 168 hours. BNP (last 3 results) No results for input(s): PROBNP in the last 8760 hours. HbA1C: No results for input(s): HGBA1C in the last 72 hours. CBG: No results for input(s): GLUCAP in the last 168 hours. Lipid Profile: No results for input(s): CHOL, HDL, LDLCALC, TRIG, CHOLHDL, LDLDIRECT in the last 72 hours. Thyroid Function Tests: No results for input(s): TSH, T4TOTAL, FREET4, T3FREE, THYROIDAB in the last 72 hours. Anemia Panel: No results for input(s): VITAMINB12, FOLATE, FERRITIN, TIBC, IRON, RETICCTPCT in the last 72 hours. Sepsis Labs: No results for input(s): PROCALCITON, LATICACIDVEN in the last 168 hours.  Recent Results (from the past 240 hour(s))  Body fluid culture     Status: None   Collection Time: 05/21/20  4:38 PM   Specimen: PATH Cytology Pleural fluid  Result Value Ref Range Status   Specimen Description   Final    PLEURAL Performed at [facility], [address] [place], [place] [address]    Special Requests NONE  Final   Gram Stain   Final    FEW WBC PRESENT, PREDOMINANTLY MONONUCLEAR NO ORGANISMS SEEN    Culture   Final    NO GROWTH Performed at [facility], [place] [address], [place], [place] [address]    Report Status 05/25/2020 FINAL  Final  Acid Fast Smear (AFB)     Status: None   Collection Time: 05/21/20  4:38 PM   Specimen: PATH Cytology Pleural fluid  Result Value Ref Range Status   AFB Specimen Processing Concentration  Final   Acid Fast Smear Negative  Final    Comment: (NOTE) Performed At: [facility] [address] [place], [place] [id number] Rush Farmer MD EG:[id number]    Source (AFB) PLEURAL  Final    Comment: Performed at [facility], [address] [place], [place] [address]         Radiology Studies: No results found.      Scheduled Meds: . amiodarone  200 mg Oral Daily  . baclofen  20 mg Oral TID  . diclofenac sodium  4 g Topical QID  . furosemide  40 mg Oral BID  . gabapentin  600 mg Oral QID  . oxyCODONE  5 mg Oral Q6H  . sodium chloride flush  3 mL Intravenous Q12H  . sodium chloride flush  3 mL Intravenous Q12H  . traZODone  75 mg Oral QHS   Continuous Infusions: . sodium chloride       LOS: 12 days    Time spent: 26 minutes    Sharen Hones, MD [facility]  To contact the attending provider between 7A-7P or the covering provider during after hours 7P-7A, please log into the web site [email or web address] and access using universal [place] password for that web site. If you do not have the password, please call the hospital operator.  05/31/2020, 8:22 AM

## 2020-06-01 NOTE — Care Management Important Message (Signed)
Important Message  Patient Details  Name: Cynthia Price MRN: [id number] Date of Birth: August 12, 1946   Medicare Important Message Given:  Yes     Dannette Barbara 06/01/2020, 11:17 AM

## 2020-06-01 NOTE — Plan of Care (Signed)
Pt on comfort care.  Medicated for pain with scheduled and PRN medication.  Emotional support provided to pt and family. Awaiting bed availability at [facility].   Problem: Education: Goal: Knowledge of General Education information will improve Description: Including pain rating scale, medication(s)/side effects and non-pharmacologic comfort measures Outcome: Progressing   Problem: Health Behavior/Discharge Planning: Goal: Ability to manage health-related needs will improve Outcome: Progressing   Problem: Clinical Measurements: Goal: Ability to maintain clinical measurements within normal limits will improve Outcome: Progressing Goal: Will remain free from infection Outcome: Progressing Goal: Diagnostic test results will improve Outcome: Progressing Goal: Respiratory complications will improve Outcome: Progressing Goal: Cardiovascular complication will be avoided Outcome: Progressing   Problem: Activity: Goal: Risk for activity intolerance will decrease Outcome: Progressing   Problem: Nutrition: Goal: Adequate nutrition will be maintained Outcome: Progressing   Problem: Coping: Goal: Level of anxiety will decrease Outcome: Progressing   Problem: Elimination: Goal: Will not experience complications related to bowel motility Outcome: Progressing Goal: Will not experience complications related to urinary retention Outcome: Progressing   Problem: Pain Managment: Goal: General experience of comfort will improve Outcome: Progressing   Problem: Safety: Goal: Ability to remain free from injury will improve Outcome: Progressing   Problem: Skin Integrity: Goal: Risk for impaired skin integrity will decrease Outcome: Progressing   Problem: Fluid Volume: Goal: Hemodynamic stability will improve Outcome: Progressing   Problem: Clinical Measurements: Goal: Diagnostic test results will improve Outcome: Progressing Goal: Signs and symptoms of infection will  decrease Outcome: Progressing   Problem: Respiratory: Goal: Ability to maintain adequate ventilation will improve Outcome: Progressing   Problem: Education: Goal: Ability to identify signs and symptoms of gastrointestinal bleeding will improve Outcome: Progressing   Problem: Bowel/Gastric: Goal: Will show no signs and symptoms of gastrointestinal bleeding Outcome: Progressing

## 2020-06-01 NOTE — Progress Notes (Signed)
[facility] Room [facility] Liaison RN note:  Met with patient and daughter, Caren Griffins, in room today. Patient is resting quietly in bed.   Unfortunately[facility] not able to offer a room today. Discussed with patient and Caren Griffins. Carlyon Shadow, RN and I[place], SW have been made aware.  [place] Liaison will continue to follow for room availability.  Please call with any hospice related questions or concerns.    Thank you.  Zandra Abts, RN [facility] Liaison [phone]

## 2020-06-01 NOTE — Progress Notes (Signed)
PROGRESS NOTE    Cynthia Price  GDJ:[id number] DOB: Feb 24, 1946 DOA: 05/18/2020 PCP: [name], MD   Chief complaint.  Shortness of breath.   Brief Narrative: Patient admitted with sepsis secondary to UTI and possibly pneumonia, currently placed on IV antibiotics. Also noted to have anemia, Xarelto held as FOBT was positive, GI consulted. Overnight, patient became febrile with tachycardia and hypoxia, repeat chest x-ray was obtained. Pulmonary consulted. S/p thoracentesis.  6/30.Patient blood pressure still low, infection seem to be better. Urine culture grew multiple species. Blood cultures negative. Palliative care consult obtained.She also had an episode of PSVT with heart rate over 200. Received adenosine,amiodarone bolus as well as amnio drip.   7/1. Patient has converted to sinus rhythm today. Still on 2 L oxygen. Condition more stable. Will likely transfer to nursing home tomorrow. Antibiotics completed.Start IV Lasix for exacerbation of diastolic congestive heart failure.  7/2.Patient is seen by palliative care, had a long discussion with the daughter and the patient, decision was made to transition to comfort care. Pending hospice transfer.    Assessment & Plan:   Active Problems:   Multiple sclerosis (HCC)   Hypotension   Acute on chronic diastolic CHF (congestive heart failure) (HCC)   Sepsis (HCC)   Pressure injury of skin   Protein-calorie malnutrition, severe   Acute hypoxemic respiratory failure (HCC)  Currently, pain is under control.  Still has short of breath.  Continue oral Lasix.  Continue oxygen.  Still pending for inpatient hospice transfer.   Subjective: Patient had no significant short of breath today, still on high flow oxygen.  Still complain of pain in the legs.  Still has some dysphagia, was able to eat.  No nausea vomiting abdominal pain.  Objective: Vitals:   05/30/20 1945 05/31/20 0712 06/01/20 0439 06/01/20  0755  BP: 108/68 106/70 (!) 92/51 115/69  Pulse: 94 84 81 89  Resp:  17 14 18   Temp: 99.1 F (37.3 C) 97.6 F (36.4 C) 98.3 F (36.8 C) 98.6 F (37 C)  TempSrc:    Oral  SpO2: 95% 98% 97% 99%  Weight:      Height:       No intake or output data in the 24 hours ending 06/01/20 1307 Filed Weights   05/26/20 0500 05/27/20 0456 05/28/20 0500  Weight: 52.9 kg 52.3 kg 52.3 kg    Examination:  General exam: Appears calm and comfortable  Respiratory system: Decreased breathing sounds with a few crackles in the base.  Respiratory effort normal. Cardiovascular system: Regular.  No JVD, murmurs, rubs, gallops or clicks. No pedal edema. Gastrointestinal system: Abdomen is nondistended, soft and nontender. No organomegaly or masses felt. Normal bowel sounds heard. Central nervous system: Alert and oriented. No focal neurological deficits. Extremities: Symmetric  Skin: No rashes, lesions or ulcers Psychiatry: Mood & affect appropriate.     Data Reviewed: I have personally reviewed following labs and imaging studies  CBC: Recent Labs  Lab 05/26/20 0455 05/27/20 0553 05/28/20 0417 05/29/20 0557  WBC  --  9.8 14.1* 12.6*  NEUTROABS  --   --  12.5* 10.7*  HGB 9.0* 9.0* 8.8* 9.7*  HCT 27.9* 27.5* 27.8* 29.5*  MCV  --  96.2 100.0 97.0  PLT  --  326 296 622   Basic Metabolic Panel: Recent Labs  Lab 05/27/20 0553 05/28/20 0417 05/29/20 0557  NA 141 144 145  K 3.2* 3.6 3.4*  CL 104 107 100  CO2 30 29 33*  GLUCOSE  111* 124* 106*  BUN 30* 32* 27*  CREATININE 0.62 0.56 0.61  CALCIUM 8.7* 8.6* 9.0  MG  --  2.3 2.2   GFR: Estimated Creatinine Clearance: 50.9 mL/min (by C-G formula based on SCr of 0.61 mg/dL). Liver Function Tests: No results for input(s): AST, ALT, ALKPHOS, BILITOT, PROT, ALBUMIN in the last 168 hours. No results for input(s): LIPASE, AMYLASE in the last 168 hours. No results for input(s): AMMONIA in the last 168 hours. Coagulation Profile: No results for  input(s): INR, PROTIME in the last 168 hours. Cardiac Enzymes: No results for input(s): CKTOTAL, CKMB, CKMBINDEX, TROPONINI in the last 168 hours. BNP (last 3 results) No results for input(s): PROBNP in the last 8760 hours. HbA1C: No results for input(s): HGBA1C in the last 72 hours. CBG: No results for input(s): GLUCAP in the last 168 hours. Lipid Profile: No results for input(s): CHOL, HDL, LDLCALC, TRIG, CHOLHDL, LDLDIRECT in the last 72 hours. Thyroid Function Tests: No results for input(s): TSH, T4TOTAL, FREET4, T3FREE, THYROIDAB in the last 72 hours. Anemia Panel: No results for input(s): VITAMINB12, FOLATE, FERRITIN, TIBC, IRON, RETICCTPCT in the last 72 hours. Sepsis Labs: No results for input(s): PROCALCITON, LATICACIDVEN in the last 168 hours.  No results found for this or any previous visit (from the past 240 hour(s)).       Radiology Studies: No results found.      Scheduled Meds:  amiodarone  200 mg Oral Daily   baclofen  20 mg Oral TID   diclofenac sodium  4 g Topical QID   furosemide  40 mg Oral BID   gabapentin  600 mg Oral QID   oxyCODONE  5 mg Oral Q6H   sodium chloride flush  3 mL Intravenous Q12H   sodium chloride flush  3 mL Intravenous Q12H   traZODone  75 mg Oral QHS   Continuous Infusions:  sodium chloride       LOS: 13 days    Time spent: 26 minutes    Sharen Hones, MD [facility]   To contact the attending provider between 7A-7P or the covering provider during after hours 7P-7A, please log into the web site [email or web address] and access using universal [place] password for that web site. If you do not have the password, please call the hospital operator.  06/01/2020, 1:07 PM

## 2020-06-02 MED ORDER — OXYCODONE HCL 5 MG PO TABS
2.5000 mg | ORAL_TABLET | ORAL | Status: DC | PRN
  Administered 2020-06-02 – 2020-06-03 (×4): 5 mg via ORAL
  Filled 2020-06-02 (×4): qty 1

## 2020-06-02 NOTE — Progress Notes (Signed)
PROGRESS NOTE    Cynthia BYRON  Price:[id number] DOB: [birth year] DOA: 05/18/2020 PCP: [name], MD   Chief complaint: shortness of breath.   Brief Narrative: Patient admitted with sepsis secondary to UTI and possibly pneumonia, currently placed on IV antibiotics. Also noted to have anemia, Xarelto held as FOBT was positive, GI consulted. Overnight, patient became febrile with tachycardia and hypoxia, repeat chest x-ray was obtained. Pulmonary consulted. S/p thoracentesis.  6/30.Patient blood pressure still low, infection seem to be better. Urine culture grew multiple species. Blood cultures negative. Palliative care consult obtained.She also had an episode of PSVT with heart rate over 200. Received adenosine,amiodarone bolus as well as amnio drip.   7/1. Patient has converted to sinus rhythm today. Still on 2 L oxygen. Condition more stable. Will likely transfer to nursing home tomorrow. Antibiotics completed.Start IV Lasix for exacerbation of diastolic congestive heart failure.  7/2.Patient is seen by palliative care, had a long discussion with the daughter and the patient, decision was made to transition to comfort care. Pending hospice transfer.   Assessment & Plan:   Active Problems:   Multiple sclerosis (HCC)   Hypotension   Acute on chronic diastolic CHF (congestive heart failure) (HCC)   Sepsis (HCC)   Pressure injury of skin   Protein-calorie malnutrition, severe   Acute hypoxemic respiratory failure [facility])   Patient is a pending for hospice inpatient transfer.  Currently receiving comfort care.  Patient has adequate pain control, continue oxygen for comfort with shortness of breath.  Still on Lasix orally.  Will transfer to hospice when bed is available.       Subjective: Still complaining of leg pain, shortness of breath, and dysphagia.  But able to swallow.  No fever or chills.  No diarrhea constipation.  Objective: Vitals:    05/31/20 0712 06/01/20 0439 06/01/20 0755 06/02/20 0745  BP: 106/70 (!) 92/51 115/69 119/71  Pulse: 84 81 89 (!) 102  Resp: 17 14 18 16   Temp: 97.6 F (36.4 C) 98.3 F (36.8 C) 98.6 F (37 C) 99.6 F (37.6 C)  TempSrc:   Oral Oral  SpO2: 98% 97% 99% 96%  Weight:      Height:        Intake/Output Summary (Last 24 hours) at 06/02/2020 1524 Last data filed at 06/02/2020 0950 Gross per 24 hour  Intake 120 ml  Output --  Net 120 ml   Filed Weights   05/26/20 0500 05/27/20 0456 05/28/20 0500  Weight: 52.9 kg 52.3 kg 52.3 kg    Examination:  General exam: Appears comfortable  Respiratory system: Decreased breathing sounds with a few crackles on the left side. Respiratory effort normal. Cardiovascular system: Regular no JVD, murmurs, rubs, gallops or clicks. No pedal edema. Gastrointestinal system: Abdomen is nondistended, soft and nontender. No organomegaly or masses felt. Normal bowel sounds heard. Central nervous system: Alert and oriented. Extremities: Symmetric  Skin: No rashes, lesions or ulcers Psychiatry:  Mood & affect appropriate.     Data Reviewed: I have personally reviewed following labs and imaging studies  CBC: Recent Labs  Lab 05/27/20 0553 05/28/20 0417 05/29/20 0557  WBC 9.8 14.1* 12.6*  NEUTROABS  --  12.5* 10.7*  HGB 9.0* 8.8* 9.7*  HCT 27.5* 27.8* 29.5*  MCV 96.2 100.0 97.0  PLT 326 296 315   Basic Metabolic Panel: Recent Labs  Lab 05/27/20 0553 05/28/20 0417 05/29/20 0557  NA 141 144 145  K 3.2* 3.6 3.4*  CL 104 107 100  CO2 30 29 33*  GLUCOSE 111* 124* 106*  BUN 30* 32* 27*  CREATININE 0.62 0.56 0.61  CALCIUM 8.7* 8.6* 9.0  MG  --  2.3 2.2   GFR: Estimated Creatinine Clearance: 50.9 mL/min (by C-G formula based on SCr of 0.61 mg/dL). Liver Function Tests: No results for input(s): AST, ALT, ALKPHOS, BILITOT, PROT, ALBUMIN in the last 168 hours. No results for input(s): LIPASE, AMYLASE in the last 168 hours. No results for input(s):  AMMONIA in the last 168 hours. Coagulation Profile: No results for input(s): INR, PROTIME in the last 168 hours. Cardiac Enzymes: No results for input(s): CKTOTAL, CKMB, CKMBINDEX, TROPONINI in the last 168 hours. BNP (last 3 results) No results for input(s): PROBNP in the last 8760 hours. HbA1C: No results for input(s): HGBA1C in the last 72 hours. CBG: No results for input(s): GLUCAP in the last 168 hours. Lipid Profile: No results for input(s): CHOL, HDL, LDLCALC, TRIG, CHOLHDL, LDLDIRECT in the last 72 hours. Thyroid Function Tests: No results for input(s): TSH, T4TOTAL, FREET4, T3FREE, THYROIDAB in the last 72 hours. Anemia Panel: No results for input(s): VITAMINB12, FOLATE, FERRITIN, TIBC, IRON, RETICCTPCT in the last 72 hours. Sepsis Labs: No results for input(s): PROCALCITON, LATICACIDVEN in the last 168 hours.  No results found for this or any previous visit (from the past 240 hour(s)).       Radiology Studies: No results found.      Scheduled Meds: . amiodarone  200 mg Oral Daily  . baclofen  20 mg Oral TID  . diclofenac sodium  4 g Topical QID  . furosemide  40 mg Oral BID  . gabapentin  600 mg Oral QID  . oxyCODONE  5 mg Oral Q6H  . sodium chloride flush  3 mL Intravenous Q12H  . sodium chloride flush  3 mL Intravenous Q12H  . traZODone  75 mg Oral QHS   Continuous Infusions: . sodium chloride       LOS: 14 days    Time spent: 27 minutes    Sharen Hones, MD [facility]   To contact the attending provider between 7A-7P or the covering provider during after hours 7P-7A, please log into the web site [email or web address] and access using universal [place] password for that web site. If you do not have the password, please call the hospital operator.  06/02/2020, 3:24 PM

## 2020-06-02 NOTE — Plan of Care (Signed)
Awaiting for hospice bed to become available.  Medicated for pain and repositioned per pt's request.  Total care and feeding assistance provided by staff.   Problem: Education: Goal: Knowledge of General Education information will improve Description: Including pain rating scale, medication(s)/side effects and non-pharmacologic comfort measures Outcome: Progressing   Problem: Health Behavior/Discharge Planning: Goal: Ability to manage health-related needs will improve Outcome: Progressing   Problem: Clinical Measurements: Goal: Ability to maintain clinical measurements within normal limits will improve Outcome: Progressing Goal: Will remain free from infection Outcome: Progressing Goal: Diagnostic test results will improve Outcome: Progressing Goal: Respiratory complications will improve Outcome: Progressing Goal: Cardiovascular complication will be avoided Outcome: Progressing   Problem: Activity: Goal: Risk for activity intolerance will decrease Outcome: Progressing   Problem: Nutrition: Goal: Adequate nutrition will be maintained Outcome: Progressing   Problem: Coping: Goal: Level of anxiety will decrease Outcome: Progressing   Problem: Elimination: Goal: Will not experience complications related to bowel motility Outcome: Progressing Goal: Will not experience complications related to urinary retention Outcome: Progressing   Problem: Pain Managment: Goal: General experience of comfort will improve Outcome: Progressing   Problem: Safety: Goal: Ability to remain free from injury will improve Outcome: Progressing   Problem: Skin Integrity: Goal: Risk for impaired skin integrity will decrease Outcome: Progressing   Problem: Fluid Volume: Goal: Hemodynamic stability will improve Outcome: Progressing   Problem: Clinical Measurements: Goal: Diagnostic test results will improve Outcome: Progressing Goal: Signs and symptoms of infection will decrease Outcome:  Progressing   Problem: Respiratory: Goal: Ability to maintain adequate ventilation will improve Outcome: Progressing   Problem: Education: Goal: Ability to identify signs and symptoms of gastrointestinal bleeding will improve Outcome: Progressing   Problem: Bowel/Gastric: Goal: Will show no signs and symptoms of gastrointestinal bleeding Outcome: Progressing

## 2020-06-02 NOTE — Progress Notes (Signed)
Daily Progress Note   Patient Name: Cynthia Price       Date: 06/02/2020 DOB: 07-06-1946  Age: 74 y.o. MRN#: [id number] Attending Physician: Sharen Hones, MD Primary Care Physician: [name], MD Admit Date: 05/18/2020  Reason for Consultation/Follow-up: Establishing goals of care  Subjective: Patient is resting in bed, no family at bedside. She complains of leg pain. She would accept medication for pain at this time. Staff aware. Purewick container with brown colored urine.        Length of Stay: 14  Current Medications: Scheduled Meds:  . amiodarone  200 mg Oral Daily  . baclofen  20 mg Oral TID  . diclofenac sodium  4 g Topical QID  . furosemide  40 mg Oral BID  . gabapentin  600 mg Oral QID  . oxyCODONE  5 mg Oral Q6H  . sodium chloride flush  3 mL Intravenous Q12H  . sodium chloride flush  3 mL Intravenous Q12H  . traZODone  75 mg Oral QHS    Continuous Infusions: . sodium chloride      PRN Meds: acetaminophen **OR** acetaminophen, antiseptic oral rinse, cyclobenzaprine, glycopyrrolate **OR** glycopyrrolate **OR** glycopyrrolate, haloperidol **OR** haloperidol **OR** haloperidol lactate, levalbuterol, LORazepam **OR** LORazepam **OR** LORazepam, metoCLOPramide, morphine injection, ondansetron **OR** ondansetron (ZOFRAN) IV, ondansetron **OR** ondansetron (ZOFRAN) IV, oxyCODONE, polyvinyl alcohol, sodium chloride, sodium chloride flush  Physical Exam Pulmonary:     Effort: Pulmonary effort is normal.  Neurological:     Mental Status: She is alert.             Vital Signs: BP 119/71 (BP Location: Right Arm)   Pulse (!) 102   Temp 99.6 F (37.6 C) (Oral)   Resp 16   Ht 5\' 5"  (1.651 m)   Wt 52.3 kg   SpO2 96%   BMI 19.20 kg/m  SpO2: SpO2: 96 % O2  Device: O2 Device: Nasal Cannula O2 Flow Rate: O2 Flow Rate (L/min): 3 L/min  Intake/output summary:   Intake/Output Summary (Last 24 hours) at 06/02/2020 1135 Last data filed at 06/02/2020 0950 Gross per 24 hour  Intake 120 ml  Output --  Net 120 ml   LBM: Last BM Date: 05/30/20 Baseline Weight: Weight: 56.7 kg Most recent weight: Weight: 52.3 kg       Palliative Assessment/Data: 20%  Patient Active Problem List   Diagnosis Date Noted  . Acute hypoxemic respiratory failure ([place]) 05/28/2020  . Protein-calorie malnutrition, severe 05/26/2020  . Pressure injury of skin 05/22/2020  . Sepsis ([place]) 05/19/2020  . Left shoulder pain 04/16/2020  . Insomnia 05/02/2019  . Neurogenic pain 12/17/2018  . Palliative care encounter 12/13/2018  . Shortness of breath 12/13/2018  . Weakness generalized 12/13/2018  . 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 Abnormal MRI, cervical spine (07/27/2018) 10/09/2018  . Cervical central spinal stenosis 10/09/2018  . Cervical foraminal stenosis 10/09/2018  . Cervical facet arthropathy 10/09/2018  . Cervicalgia 10/09/2018  . Cervical facet syndrome (Bilateral) (R>L) 10/09/2018  . Chronic upper extremity pain (Secondary Area of Pain) (Bilateral) (R>L) 09/17/2018  . Long term current use of anticoagulant therapy (Xarelto) 09/17/2018  . DDD (degenerative disc disease), cervical 09/17/2018  . Chronic neck pain (Primary Area of Pain) (Bilateral) (R>L) 08/27/2018  . Chronic low back pain [facility] Area of Pain) (Bilateral) (L>R) w/ sciatica (Bilateral) 08/27/2018  . Chronic knee pain (Fourth Area of Pain) (Bilateral) (L>R) 08/27/2018  . Chronic lower extremity pain (Bilateral) (L>R) 08/27/2018  . Chronic pain syndrome 08/27/2018  . Pharmacologic therapy 08/27/2018  . Disorder of skeletal system 08/27/2018  . Problems influencing health status 08/27/2018  . Long term current use of opiate analgesic 08/27/2018  . Degenerative cervical spinal stenosis  08/06/2018  . Resides in skilled nursing facility 08/06/2018  . Rash 06/19/2018  . Muscle spasticity 05/17/2018  . Inability to walk 05/17/2018  . Mild protein-calorie malnutrition ([place]) 01/03/2018  . Ulcer, skin, non-healing, limited to breakdown of skin ([place]) 12/19/2017  . Intractable pain 08/19/2017  . Chronic deep vein thrombosis (DVT) of proximal vein of left lower extremity ([place]) 05/16/2017  . Seizure-like activity ([place]) 04/20/2017  . Bladder mass 01/20/2017  . Encounter for general adult medical examination without abnormal findings 11/24/2016  . Spondylosis of lumbar region without myelopathy or radiculopathy 05/23/2016  . 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 Urinary retention with incomplete bladder emptying 12/22/2015  . ET (essential thrombocythemia) ([place]) 12/22/2015  . DS (disseminated sclerosis) ([place]) 09/15/2015  . Rotator cuff tendinitis (Right) 06/19/2015  . Back sprain 06/05/2015  . Chest wall contusion 06/05/2015  . Effusion of knee 05/22/2015  . Arthritis of knee, degenerative 05/22/2015  . Effusion of left knee 05/22/2015  . Osteoarthritis of knee (Left) 05/22/2015  . Contusion of shoulder 04/14/2015  . 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[place]) 03/05/2015  . Mild pulmonary hypertension ([place]) 03/05/2015  . Complete rotator cuff rupture of shoulder (Left) 02/05/2015  . Infraspinatus tenosynovitis 02/05/2015  . Complete tear of left rotator cuff 02/05/2015  . Acute on chronic diastolic CHF (congestive heart failure) ([place]) 09/07/2014  . Deep vein thrombosis ([place]) 07/10/2014  . History of deep vein thrombosis (DVT) of lower extremity 07/10/2014  . HLD (hyperlipidemia) 03/08/2014  . BP (high blood pressure) 03/08/2014  . Hyperlipidemia 03/08/2014  . Hypertension 03/08/2014  . UTI (urinary tract infection) 02/05/2014  . Cellulitis, toe 08/18/2013  . Bradycardia 03/26/2013  . Hypotension 03/26/2013  . RLS (restless legs syndrome) 08/31/2012  . Constipation  07/20/2012  . Prolapse of urethra 07/20/2012  . Frequent UTI 07/20/2012  . Neurogenic dysfunction of the urinary bladder 07/20/2012  . Vaginal atrophy 07/20/2012  . Recurrent UTI 07/20/2012  . Neuromuscular dysfunction of bladder 07/20/2012  . Prolapse urethral mucosa 07/20/2012  . DVT (deep venous thrombosis) ([place]) 05/14/2012  . Risk for falls 03/16/2012  . Lumbar canal stenosis 02/16/2012  . Lumbosacral  radiculitis 02/16/2012  . Restless leg 01/20/2012  . 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 Restless legs syndrome (RLS) 01/20/2012  . Urinary retention 12/04/2011  . CAD (coronary artery disease) 10/18/2011  . Anemia 10/17/2011  . Diastolic dysfunction 30/86/5784  . Tachycardia 10/01/2011  . Thrombocythemia, essential ([place]) 05/26/2011  . Edema 05/26/2011  . HYPERCHOLESTEROLEMIA 04/29/2010  . Depression 04/29/2010  . CYSTOCELE WITHOUT MENTION UTERINE PROLAPSE LAT 04/29/2010  . UNS ADVRS EFF OTH RX MEDICINAL&BIOLOGICAL SBSTNC 04/29/2010  . Depressive disorder, not elsewhere classified 04/29/2010  . Vitamin D deficiency 04/29/2010  . Multiple sclerosis (Between) 04/29/2008  . NEUROPATHY 04/29/2008  . FATIGUE 04/29/2008    Palliative Care Assessment & Plan    Recommendations/Plan:  Comfort care.  Recommend hospice facility.       Code Status:    Code Status Orders  (From admission, onward)         Start     Ordered   05/19/20 0527  Do not attempt resuscitation (DNR)  Continuous       Question Answer Comment  In the event of cardiac or respiratory ARREST Do not call a "code blue"   In the event of cardiac or respiratory ARREST Do not perform Intubation, CPR, defibrillation or ACLS   In the event of cardiac or respiratory ARREST Use medication by any route, position, wound care, and other measures to relive pain and suffering. May use oxygen, suction and manual treatment of airway obstruction as needed for comfort.      05/19/20 0526          Code Status History    Date Active Date Inactive Code Status Order ID Comments User Context   05/19/2020 0451 05/19/2020 0526 Full Code [id number]  Christel Mormon, MD ED   02/02/2019 0631 02/04/2019 1819 DNR [id number]  [name], MD Inpatient   08/19/2017 2055 08/22/2017 2245 DNR [id number]  Nicholes Mango, MD Inpatient   Advance Care Planning Activity    Advance Directive Documentation     Most Recent Value  Type of Advance Directive Out of facility DNR (pink MOST or yellow form)  Pre-existing out of facility DNR order (yellow form or pink MOST form) Yellow form placed in chart (order not valid for inpatient use)  "MOST" Form in Place? --       Prognosis:   < 2 weeks Hypotension that has caused need for Midodrine. Sepsis with UTI and PNA. PNA and pleural effusion. CHF. Poor PO intake. Concern for symptom management needs as patient does not feel that she has improved from a respiratory standpoint since admission.    Care plan was discussed with RN  Thank you for allowing the Palliative Medicine Team to assist in the care of this patient.   Total Time 15 min Prolonged Time Billed no      Greater than 50%  of this time was spent counseling and coordinating care related to the above assessment and plan.  Asencion Gowda, NP  Please contact Palliative Medicine Team phone at [phone] for questions and concerns.

## 2020-06-02 NOTE — Progress Notes (Signed)
[facility] Room [facility] Liaison RN note:  Met with patient and daughter, Caren Griffins, today. Informed that [facility] does not have a bed to offer today . Hospital care team is aware.   [facility] Liaison will continue to follow for room availability.  Please call with any hospice related questions or concerns.    Thank you.  Zandra Abts, RN [facility] Liaison [phone]

## 2020-06-03 MED ORDER — AMIODARONE HCL 200 MG PO TABS: 200.0000 mg | ORAL_TABLET | Freq: Every day | ORAL | 0 refills | Status: AC

## 2020-06-03 MED ORDER — FUROSEMIDE 40 MG PO TABS: 40.0000 mg | ORAL_TABLET | Freq: Two times a day (BID) | ORAL | 1 refills | Status: AC

## 2020-06-03 NOTE — Discharge Summary (Signed)
Physician Discharge Summary [facility]    Patient: Cynthia Price                   Admit date: 05/18/2020   DOB: [birth year]             Discharge date:06/03/2020/11:58 AM HCW:[id number]                         PCP: [name], MD  Disposition: Hospice  Recommendations for Outpatient Follow-up:   . Follow up: in 1 day  Discharge Condition: Stable   Code Status:   Code Status: DNR  Diet recommendation: Regular healthy diet   Discharge Diagnoses:    Active Problems:   Multiple sclerosis (HCC)   Hypotension   Acute on chronic diastolic CHF (congestive heart failure) (HCC)   Sepsis (HCC)   Pressure injury of skin   Protein-calorie malnutrition, severe   Acute hypoxemic respiratory failure ([place])   History of Present Illness/ Hospital Course Kathleen Argue Summary:    Patient admitted with sepsis secondary to UTI and possibly pneumonia, currently placed on IV antibiotics. Also noted to have anemia, Xarelto held as FOBT was positive, GI consulted. Overnight, patient became febrile with tachycardia and hypoxia, repeat chest x-ray was obtained. Pulmonary consulted. S/p thoracentesis.  6/30.Patient blood pressure still low, infection seem to be better. Urine culture grew multiple species. Blood cultures negative. Palliative care consult obtained.She also had an episode of PSVT with heart rate over 200. Received adenosine,amiodarone bolus as well as amnio drip.   7/1. Patient has converted to sinus rhythm today. Still on 2 L oxygen. Condition more stable. Will likely transfer to nursing home tomorrow. Antibiotics completed.Start IV Lasix for exacerbation of diastolic congestive heart failure.  7/2.Patient is seen by palliative care, had a long discussion with the daughter and the patient, decision was made to transition to comfort care. Pending hospice transfer.   Assessment & Plan:   Active Problems:   Multiple sclerosis (HCC)    Hypotension   Acute on chronic diastolic CHF (congestive heart failure) (HCC)   Sepsis (HCC)   Pressure injury of skin   Protein-calorie malnutrition, severe   Acute hypoxemic respiratory failure ([place])   Patient  has accepted hospice...  inpatient transfer to hospice initiated.  Currently receiving comfort care.  Patient has adequate pain control, continue oxygen for comfort with shortness of breath.  Still on Lasix orally.  Will transfer to hospice  Today.    Sepsis secondary to UTI and pneumonia possibly gram-negative.  I do not believe she has a Pseudomonas risk. -Was initially admitted and treated aggressively with IV antibiotics -eIV antibiotic therapy with IV Rocephin and Zithromax. -Duo neb-Mucolytic therapy  -Cultures negative to date -Status post gentle IV fluid hydration . -Her blood pressure has been steady when she is awake.  2.  Anemia, slightly worse than previous levels. -H&H remained stable -hold off on transfusion  - patient received 5% albumin. - vitamin B12 and multivitamins.  3.  Multiple sclerosis. -We will continue her Ampyra and DC'd baclofen.  4.  Depression. - continue her Lexapro.  5.  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 -We will continue Xarelto.      Nutritional status:  Nutrition Problem: Severe Malnutrition Etiology: chronic illness (MS, dementia, IBS, CHF) Signs/Symptoms: moderate fat depletion, severe fat depletion, moderate muscle depletion, severe muscle depletion Interventions: Ensure Enlive (each supplement provides 350kcal and 20 grams of protein), Magic cup   Discharge  Instructions:   Discharge Instructions    Activity as tolerated - No restrictions   Complete by: As directed    Diet - low sodium heart healthy   Complete by: As directed    Discharge instructions   Complete by: As directed    Please follow-up with continue with hospice, please follow-up with hospice medication recommendation.   Increase activity  slowly   Complete by: As directed    Leave dressing on - Keep it clean, dry, and intact until clinic visit   Complete by: As directed    Remove dressing in 48 hours   Complete by: As directed        Medication List    STOP taking these medications   baclofen 20 MG tablet Commonly known as: LIORESAL   hydroxyurea 500 MG capsule Commonly known as: Hydrea   loratadine 10 MG tablet Commonly known as: CLARITIN   Magnesium 500 MG Caps   rOPINIRole 2 MG tablet Commonly known as: REQUIP   tiZANidine 4 MG tablet Commonly known as: ZANAFLEX     TAKE these medications   acetaminophen 325 MG tablet Commonly known as: TYLENOL Take 650 mg by mouth every 4 (four) hours as needed. Give 2 tablets by mouth every 4 hours prn for general discomfort. Do not exceed 3 grams of APAP/day from all sources   amiodarone 200 MG tablet Commonly known as: PACERONE Take 1 tablet (200 mg total) by mouth daily. Start taking on: June 04, 2020   Ampyra 10 MG Tb12 Generic drug: dalfampridine Take 10 mg by mouth 2 (two) times daily.   diclofenac sodium 1 % Gel Commonly known as: VOLTAREN Apply 4 g topically in the morning and at bedtime. Apply to left shoulder and left knee   escitalopram 20 MG tablet Commonly known as: LEXAPRO Take 20 mg by mouth daily.   fluticasone 50 MCG/ACT nasal spray Commonly known as: FLONASE Place 1 spray into both nostrils daily.   furosemide 40 MG tablet Commonly known as: LASIX Take 1 tablet (40 mg total) by mouth 2 (two) times daily.   gabapentin 600 MG tablet Commonly known as: NEURONTIN Take 600 mg by mouth in the morning, at noon, in the evening, and at bedtime.   metoCLOPramide 5 MG tablet Commonly known as: REGLAN Take 5 mg by mouth. 1 tablet by mouth before meals and at bedtime.   multivitamin tablet Take 1 tablet by mouth daily.   oxyCODONE 5 MG immediate release tablet Commonly known as: Oxy IR/ROXICODONE Take 1 tablet (5 mg total) by mouth  every 6 (six) hours as needed for up to 30 days for severe pain. Must last 30 days. What changed:   when to take this  Another medication with the same name was removed. Continue taking this medication, and follow the directions you see here.   oxyCODONE 5 MG immediate release tablet Commonly known as: Oxy IR/ROXICODONE Take 1 tablet (5 mg total) by mouth every 6 (six) hours as needed for up to 30 days for severe pain. Must Last 30 days. What changed:   Another medication with the same name was changed. Make sure you understand how and when to take each.  Another medication with the same name was removed. Continue taking this medication, and follow the directions you see here.   tamsulosin 0.4 MG Caps capsule Commonly known as: FLOMAX Take 0.4 mg by mouth daily.   traZODone 50 MG tablet Commonly known as: DESYREL Take 75 mg by mouth at  bedtime.   Xarelto 20 MG Tabs tablet Generic drug: rivaroxaban Take 20 mg by mouth daily with breakfast.       Allergies  Allergen Reactions  . Atorvastatin     Muscle aches at >40mg       Procedures /Studies:   DG Chest 1 View  Addendum Date: 05/21/2020   ADDENDUM REPORT: 05/21/2020 06:03 ADDENDUM: Additional attempts were made to reposition the patient. With diminished patient rotation and less obscuration of the left upper lung. These images reveal increasing now completely confluent opacity in the left lung base with air bronchograms likely reflecting a combination of consolidated lung, volume loss and layering effusion. Increasing opacities are present throughout the right mid to lower lung as well which could reflect further airspace disease or edema as well as small right effusion. This addendum will be called to the ordering clinician or representative by the Radiologist Assistant, and communication documented in the PACS or [employer]. Electronically Signed   By: Lovena Le M.D.   On: 05/21/2020 06:03   Result Date:  05/21/2020 CLINICAL DATA:  Shortness of breath EXAM: CHEST  1 VIEW COMPARISON:  Radiograph 05/19/2020, CT 06/14/2015 FINDINGS: Difficulty with patient positioning with the head projecting over the left mid to upper lung obscuring much of the visible portions of the left lung itself. Additionally there is a steep right anterior obliquity. Redemonstration of some patchy and streaky right basilar opacity. More globally increased opacity in the retrocardiac space. Obscuration of the hemidiaphragms is suggestive of developing pleural effusions. No visible right pneumothorax. Left apex obscured. Visible mediastinal contours are not significantly changed from prior though much of the left heart border is obscured. No acute osseous or soft tissue abnormality. Chronic rib deformities are again seen. Telemetry leads overlie the chest. IMPRESSION: 1. Persistent right basilar airspace opacities with developing right effusion. 2. Globally increased opacity in the retrocardiac space, could reflect developing pleural effusion, atelectasis or pneumonia. 3. Marked patient rotation as well patient's head obscuring the left mid to upper lung, limiting evaluation. Electronically Signed: By: Lovena Le M.D. On: 05/21/2020 05:13   CT CHEST W CONTRAST  Result Date: 05/21/2020 CLINICAL DATA:  Respiratory failure history of multiple sclerosis and DVT on Xarelto. EXAM: CT CHEST WITH CONTRAST TECHNIQUE: Multidetector CT imaging of the chest was performed during intravenous contrast administration. CONTRAST:  85mL OMNIPAQUE IOHEXOL 300 MG/ML  SOLN COMPARISON:  06/13/2015 FINDINGS: Cardiovascular: Heart size is normal with small pericardial effusion. Central pulmonary vasculature is normal on venous phase assessment, study not protocol for PE evaluation. Aortic caliber is normal. Mediastinum/Nodes: No axillary adenopathy. Thoracic inlet structures are normal. Lungs/Pleura: Moderate bilateral pleural effusions. Basilar airspace disease  bilaterally. Effusion greatest on the RIGHT. Airspace disease greatest on the LEFT with component of volume loss and shift of mediastinal structures from RIGHT to LEFT into the LEFT chest. Upper Abdomen: Incidental imaging of upper abdominal contents without acute process. Musculoskeletal: Chronic deformity of the sternum indicative of prior fracture. Evidence of spinal fusion at the upper portion of the lumbar spine, incompletely imaged on CT data. No acute bone finding. T11 compression with similar appearance. IMPRESSION: 1. Moderate bilateral pleural effusions with bibasilar airspace disease greatest on the LEFT with component of volume loss and shift of mediastinal structures from RIGHT to LEFT into the LEFT chest. Much of this appears to represent volume loss though there is some areas of variable enhancement that raise the question of concomitant pneumonia particularly at the LEFT lung base. Loss of air  bronchograms raising the question of aspiration though there is no material seen in the trachea or larger airways. 2. Small pericardial effusion. 3. Chronic deformity of the sternum indicative of prior fracture. 4. T11 compression with similar appearance. 5. Aortic atherosclerosis. Aortic Atherosclerosis (ICD10-I70.0). Electronically Signed   By: Zetta Bills M.D.   On: 05/21/2020 11:06   DG Chest Port 1 View  Result Date: 05/23/2020 CLINICAL DATA:  Per chart Patient admitted with sepsis secondary to UTI and possibly pneumonia. Hx of high blood pressure, chronic heart failure, hypotension. Non smoker. EXAM: PORTABLE CHEST 1 VIEW COMPARISON:  05/21/2020 FINDINGS: Left lung base opacity obscures the hemidiaphragm and left heart border, associated with air bronchograms. There is bilateral vascular congestion and interstitial thickening. Hazy opacity is noted at the right lung base, which appears increased from the previous exam. Cardiac silhouette is normal in size. No mediastinal or hilar masses. Small  bilateral effusions, better appreciated on the CT from 05/21/2020. No pneumothorax. IMPRESSION: 1. Mild increase in hazy opacity at the right lung base compared to the most recent prior exam, which may reflect an increase in atelectasis, pneumonia or a combination. 2. No other change. 3. Persistent consolidation in the left lung base, which may reflect pneumonia or atelectasis. Bilateral vascular congestion and interstitial prominence, also unchanged, without overt pulmonary edema. Electronically Signed   By: Lajean Manes M.D.   On: 05/23/2020 15:42   DG Chest Port 1 View  Result Date: 05/21/2020 CLINICAL DATA:  Status post right-sided thoracentesis. EXAM: PORTABLE CHEST 1 VIEW COMPARISON:  May 21, 2020 (4:34 a.m.) FINDINGS: The study is limited secondary to limited patient positioning. The lungs are hyperinflated. Mild to moderate severity diffuse chronic appearing increased interstitial lung markings are seen. There is opacification of the lower half of the left lung. No pneumothorax is identified. The cardiac silhouette is borderline in size. Multiple chronic left-sided rib fractures are seen. Degenerative changes seen throughout the thoracic spine. IMPRESSION: 1. Opacification of the lower half of the left lung consistent with moderate-sized pleural effusion and associated atelectasis versus infiltrate. 2. Underlying chronic interstitial lung disease. Electronically Signed   By: Virgina Norfolk M.D.   On: 05/21/2020 16:58   DG Chest Portable 1 View  Result Date: 05/19/2020 CLINICAL DATA:  Sepsis.  Hypotension.  Fever. EXAM: PORTABLE CHEST 1 VIEW COMPARISON:  08/19/2017 FINDINGS: Patient's chin obscures the apices. Significant patient rotation. Stable heart size and mediastinal contours. Streaky retrocardiac opacity. Patchy right lung base opacity. Possible small pleural effusions. No pneumothorax. No pulmonary edema. Bones under mineralized with scoliotic curvature of the spine. IMPRESSION: 1.  Streaky retrocardiac opacity and patchy right lung base opacity, may be atelectasis or pneumonia. Possible small pleural effusions. 2. Rotated exam. Electronically Signed   By: Keith Rake M.D.   On: 05/19/2020 01:01   ECHOCARDIOGRAM COMPLETE  Result Date: 05/28/2020    ECHOCARDIOGRAM REPORT   Patient Name:   Cynthia Price Date of Exam: 05/28/2020 Medical Rec #:  [id number]      Height:       65.0 in Accession #:    [id number]     Weight:       115.4 lb Date of Birth:  04-Mar-1946      BSA:          1.566 m Patient Age:    51 years       BP:           129/74 mmHg Patient Gender: F  HR:           67 bpm. Exam Location:  [facility] Procedure: 2D Echo, Cardiac Doppler and Color Doppler Indications:     CHF- acute diastolic 947.65  History:         Patient has no prior history of Echocardiogram examinations.                  CAD. DVT,lupus.  Sonographer:     Sherrie Sport RDCS (AE) Referring Phys:  [id number] Sharen Hones Diagnosing Phys: Neoma Laming MD  Sonographer Comments: Technically challenging study due to limited acoustic windows. Patient was contracted on left side- difficult to find echo views on left side. IMPRESSIONS  1. Left ventricular ejection fraction, by estimation, is 50 to 55%. The left ventricle has low normal function. The left ventricle has no regional wall motion abnormalities. Left ventricular diastolic parameters are consistent with Grade I diastolic dysfunction (impaired relaxation).  2. Right ventricular systolic function is severely reduced. The right ventricular size is severely enlarged. Mildly increased right ventricular wall thickness. There is mildly elevated pulmonary artery systolic pressure.  3. Left atrial size was mildly dilated.  4. Right atrial size was mildly dilated.  5. The mitral valve is normal in structure. No evidence of mitral valve regurgitation. No evidence of mitral stenosis.  6. The aortic valve is normal in structure. Aortic valve regurgitation is not visualized.  Mild aortic valve sclerosis is present, with no evidence of aortic valve stenosis.  7. The inferior vena cava is normal in size with greater than 50% respiratory variability, suggesting right atrial pressure of 3 mmHg. FINDINGS  Left Ventricle: Left ventricular ejection fraction, by estimation, is 50 to 55%. The left ventricle has low normal function. The left ventricle has no regional wall motion abnormalities. The left ventricular internal cavity size was normal in size. There is no left ventricular hypertrophy. Left ventricular diastolic parameters are consistent with Grade I diastolic dysfunction (impaired relaxation). Right Ventricle: The right ventricular size is severely enlarged. Mildly increased right ventricular wall thickness. Right ventricular systolic function is severely reduced. There is mildly elevated pulmonary artery systolic pressure. The tricuspid regurgitant velocity is 2.57 m/s, and with an assumed right atrial pressure of 10 mmHg, the estimated right ventricular systolic pressure is 65.6 mmHg. Left Atrium: Left atrial size was mildly dilated. Right Atrium: Right atrial size was mildly dilated. Pericardium: There is no evidence of pericardial effusion. Mitral Valve: The mitral valve is normal in structure. Normal mobility of the mitral valve leaflets. No evidence of mitral valve regurgitation. No evidence of mitral valve stenosis. Tricuspid Valve: The tricuspid valve is normal in structure. Tricuspid valve regurgitation is trivial. No evidence of tricuspid stenosis. Aortic Valve: The aortic valve is normal in structure. Aortic valve regurgitation is not visualized. Mild aortic valve sclerosis is present, with no evidence of aortic valve stenosis. Aortic valve mean gradient measures 2.5 mmHg. Aortic valve peak gradient measures 4.7 mmHg. Aortic valve area, by VTI measures 2.48 cm. Pulmonic Valve: The pulmonic valve was normal in structure. Pulmonic valve regurgitation is trivial. No evidence of  pulmonic stenosis. Aorta: The aortic root is normal in size and structure. Venous: The inferior vena cava is normal in size with greater than 50% respiratory variability, suggesting right atrial pressure of 3 mmHg. IAS/Shunts: No atrial level shunt detected by color flow Doppler.  LEFT VENTRICLE PLAX 2D LVIDd:         3.39 cm  Diastology LVIDs:  2.44 cm  LV e' lateral:   8.81 cm/s LV PW:         1.18 cm  LV E/e' lateral: 7.7 LV IVS:        0.90 cm  LV e' medial:    4.03 cm/s LVOT diam:     2.00 cm  LV E/e' medial:  16.7 LV SV:         53 LV SV Index:   34 LVOT Area:     3.14 cm  RIGHT VENTRICLE RV Basal diam:  3.57 cm LEFT ATRIUM           Index       RIGHT ATRIUM           Index LA diam:      3.30 cm 2.11 cm/m  RA Area:     10.90 cm LA Vol (A2C): 43.7 ml 27.91 ml/m RA Volume:   24.10 ml  15.39 ml/m LA Vol (A4C): 26.4 ml 16.86 ml/m  AORTIC VALVE                   PULMONIC VALVE AV Area (Vmax):    2.14 cm    RVOT Peak grad: 1 mmHg AV Area (Vmean):   1.87 cm AV Area (VTI):     2.48 cm AV Vmax:           108.50 cm/s AV Vmean:          75.900 cm/s AV VTI:            0.213 m AV Peak Grad:      4.7 mmHg AV Mean Grad:      2.5 mmHg LVOT Vmax:         73.90 cm/s LVOT Vmean:        45.200 cm/s LVOT VTI:          0.168 m LVOT/AV VTI ratio: 0.79  AORTA Ao Root diam: 2.30 cm MITRAL VALVE               TRICUSPID VALVE MV Area (PHT): 5.13 cm    TR Peak grad:   26.4 mmHg MV Decel Time: 148 msec    TR Vmax:        257.00 cm/s MV E velocity: 67.40 cm/s MV A velocity: 63.10 cm/s  SHUNTS MV E/A ratio:  1.07        Systemic VTI:  0.17 m                            Systemic Diam: 2.00 cm Neoma Laming MD Electronically signed by Neoma Laming MD Signature Date/Time: 05/28/2020/4:38:31 PM    Final    US THORACENTESIS ASP PLEURAL SPACE W/IMG GUIDE  Result Date: 05/21/2020 CLINICAL DATA:  Respiratory failure and bilateral pleural effusions, right greater than left. EXAM: ULTRASOUND GUIDED RIGHT THORACENTESIS COMPARISON:   None. PROCEDURE: An ultrasound guided thoracentesis was thoroughly discussed with the patient and questions answered. The benefits, risks, alternatives and complications were also discussed. The patient understands and wishes to proceed with the procedure. Written consent was obtained. Ultrasound was performed to localize and mark an adequate pocket of fluid in the right chest. The area was then prepped and draped in the normal sterile fashion. 1% Lidocaine was used for local anesthesia. Under ultrasound guidance a 6 French Safe-T-Centesis catheter was introduced. Thoracentesis was performed. The catheter was removed and a dressing applied. COMPLICATIONS: None FINDINGS: A total of approximately 550  mL of clear, yellow fluid was removed. A fluid sample was sent for laboratory analysis. IMPRESSION: Successful ultrasound guided right thoracentesis yielding 550 mL of pleural fluid. Electronically Signed   By: Aletta Edouard M.D.   On: 05/21/2020 17:00     Subjective:   Patient was seen and examined 06/03/2020, 11:58 AM Patient stable today. No acute distress.  No issues overnight Stable for discharge.  Discharge Exam:    Vitals:   05/31/20 0712 06/01/20 0439 06/01/20 0755 06/02/20 0745  BP: 106/70 (!) 92/51 115/69 119/71  Pulse: 84 81 89 (!) 102  Resp: 17 14 18 16   Temp: 97.6 F (36.4 C) 98.3 F (36.8 C) 98.6 F (37 C) 99.6 F (37.6 C)  TempSrc:   Oral Oral  SpO2: 98% 97% 99% 96%  Weight:      Height:        General: Pt lying comfortably in bed & appears in no obvious distress. Cardiovascular: S1 & S2 heard, RRR, S1/S2 +. No murmurs, rubs, gallops or clicks. No JVD or pedal edema. Respiratory: Clear to auscultation without wheezing, rhonchi or crackles. No increased work of breathing. Abdominal:  Non-distended, non-tender & soft. No organomegaly or masses appreciated. Normal bowel sounds heard. CNS: Alert and oriented. No focal deficits. Extremities: no edema, no cyanosis    The  results of significant diagnostics from this hospitalization (including imaging, microbiology, ancillary and laboratory) are listed below for reference.      Microbiology:   No results found for this or any previous visit (from the past 240 hour(s)).   Labs:   CBC: Recent Labs  Lab 05/28/20 0417 05/29/20 0557  WBC 14.1* 12.6*  NEUTROABS 12.5* 10.7*  HGB 8.8* 9.7*  HCT 27.8* 29.5*  MCV 100.0 97.0  PLT 296 161   Basic Metabolic Panel: Recent Labs  Lab 05/28/20 0417 05/29/20 0557  NA 144 145  K 3.6 3.4*  CL 107 100  CO2 29 33*  GLUCOSE 124* 106*  BUN 32* 27*  CREATININE 0.56 0.61  CALCIUM 8.6* 9.0  MG 2.3 2.2   Liver Function Tests: No results for input(s): AST, ALT, ALKPHOS, BILITOT, PROT, ALBUMIN in the last 168 hours. BNP (last 3 results) Recent Labs    05/28/20 0417  BNP 4,403.0*   Cardiac Enzymes: No results for input(s): CKTOTAL, CKMB, CKMBINDEX, TROPONINI in the last 168 hours. CBG: No results for input(s): GLUCAP in the last 168 hours. Hgb A1c No results for input(s): HGBA1C in the last 72 hours. Lipid Profile No results for input(s): CHOL, HDL, LDLCALC, TRIG, CHOLHDL, LDLDIRECT in the last 72 hours. Thyroid function studies No results for input(s): TSH, T4TOTAL, T3FREE, THYROIDAB in the last 72 hours.  Invalid input(s): FREET3 Anemia work up No results for input(s): VITAMINB12, FOLATE, FERRITIN, TIBC, IRON, RETICCTPCT in the last 72 hours. Urinalysis    Component Value Date/Time   COLORURINE AMBER (A) 05/19/2020 0010   APPEARANCEUR TURBID (A) 05/19/2020 0010   APPEARANCEUR Clear 09/25/2015 1020   LABSPEC 1.016 05/19/2020 0010   LABSPEC 1.011 11/02/2014 1914   PHURINE 8.0 05/19/2020 0010   GLUCOSEU NEGATIVE 05/19/2020 0010   GLUCOSEU Negative 11/02/2014 1914   HGBUR NEGATIVE 05/19/2020 0010   HGBUR trace-intact 04/29/2010 0952   BILIRUBINUR NEGATIVE 05/19/2020 0010   BILIRUBINUR Negative 09/25/2015 1020   BILIRUBINUR Negative 11/02/2014  1914   KETONESUR 5 (A) 05/19/2020 0010   PROTEINUR 100 (A) 05/19/2020 0010   UROBILINOGEN negative 02/04/2014 1603   UROBILINOGEN 0.2 04/29/2010 0960  NITRITE NEGATIVE 05/19/2020 0010   LEUKOCYTESUR TRACE (A) 05/19/2020 0010   LEUKOCYTESUR Negative 11/02/2014 1914   Pressure Injury 05/19/20 Sacrum Stage 2 -  Partial thickness loss of dermis presenting as a shallow open injury with a red, pink wound bed without slough. (Active)  05/19/20 1744  Location: Sacrum  Location Orientation:   Staging: Stage 2 -  Partial thickness loss of dermis presenting as a shallow open injury with a red, pink wound bed without slough.  Wound Description (Comments):   Present on Admission: Yes       Time coordinating discharge: Over 45 minutes  SIGNED: Deatra James, MD, FACP, [facility]. [facility],  Please use [email or web address] to Page If 7PM-7AM, please contact night-coverage [email or web address] 06/03/2020, 11:58 AM

## 2020-06-03 NOTE — Progress Notes (Signed)
[facility] Liaison RN note:  Hospice home does have a bed available today. Registration paperwork to be completed today at 11:30 am by daughter, Caren Griffins at the [facility].  Hospital care team has been notified. I will call report and will call EMS once complete. I will also fax the discharge summary to the facility.  Please call with any hospice related questions or concerns.  Thank you for the opportunity to participate in this patients care.  Zandra Abts, RN [facility] Liaison [phone]

## 2020-06-28 DEATH — deceased

## 2020-07-06 LAB — ACID FAST CULTURE WITH REFLEXED SENSITIVITIES (MYCOBACTERIA): Acid Fast Culture: NEGATIVE
